# Patient Record
Sex: Male | Born: 1955 | Race: White | Hispanic: No | Marital: Married | Smoking: Former smoker
Health system: Southern US, Community
[De-identification: ages and names within clinical notes are randomized; demographics above are authoritative.]

## PROBLEM LIST (undated history)

## (undated) DIAGNOSIS — F32A Depression, unspecified: Secondary | ICD-10-CM

## (undated) DIAGNOSIS — Z1509 Genetic susceptibility to other malignant neoplasm: Secondary | ICD-10-CM

## (undated) DIAGNOSIS — D369 Benign neoplasm, unspecified site: Secondary | ICD-10-CM

## (undated) DIAGNOSIS — Z973 Presence of spectacles and contact lenses: Secondary | ICD-10-CM

## (undated) DIAGNOSIS — M549 Dorsalgia, unspecified: Secondary | ICD-10-CM

## (undated) DIAGNOSIS — I209 Angina pectoris, unspecified: Secondary | ICD-10-CM

## (undated) DIAGNOSIS — F419 Anxiety disorder, unspecified: Secondary | ICD-10-CM

## (undated) DIAGNOSIS — Z87820 Personal history of traumatic brain injury: Secondary | ICD-10-CM

## (undated) DIAGNOSIS — M719 Bursopathy, unspecified: Secondary | ICD-10-CM

## (undated) DIAGNOSIS — Z8616 Personal history of COVID-19: Secondary | ICD-10-CM

## (undated) DIAGNOSIS — A6920 Lyme disease, unspecified: Secondary | ICD-10-CM

## (undated) DIAGNOSIS — F1020 Alcohol dependence, uncomplicated: Secondary | ICD-10-CM

## (undated) DIAGNOSIS — Z9889 Other specified postprocedural states: Secondary | ICD-10-CM

## (undated) DIAGNOSIS — R350 Frequency of micturition: Secondary | ICD-10-CM

## (undated) DIAGNOSIS — I2510 Atherosclerotic heart disease of native coronary artery without angina pectoris: Secondary | ICD-10-CM

## (undated) DIAGNOSIS — G629 Polyneuropathy, unspecified: Secondary | ICD-10-CM

## (undated) DIAGNOSIS — K219 Gastro-esophageal reflux disease without esophagitis: Secondary | ICD-10-CM

## (undated) DIAGNOSIS — E7800 Pure hypercholesterolemia, unspecified: Secondary | ICD-10-CM

## (undated) DIAGNOSIS — M1990 Unspecified osteoarthritis, unspecified site: Secondary | ICD-10-CM

## (undated) HISTORY — DX: Benign neoplasm, unspecified site: D36.9

## (undated) HISTORY — DX: Gastro-esophageal reflux disease without esophagitis: K21.9

## (undated) HISTORY — PX: KNEE SURGERY: SHX244

## (undated) HISTORY — PX: NOSE SURGERY: SHX723

## (undated) HISTORY — PX: BASAL CELL CARCINOMA EXCISION: SHX1214

## (undated) HISTORY — DX: Bursopathy, unspecified: M71.9

## (undated) HISTORY — PX: TYMPANOSTOMY TUBE PLACEMENT: SHX32

## (undated) HISTORY — PX: RHINOPLASTY: SUR1284

## (undated) HISTORY — PX: EYE SURGERY: SHX253

## (undated) HISTORY — DX: Genetic susceptibility to other malignant neoplasm: Z15.09

---

## 2001-05-06 ENCOUNTER — Ambulatory Visit (HOSPITAL_COMMUNITY): Admission: RE | Admit: 2001-05-06 | Discharge: 2001-05-06 | Payer: Self-pay | Admitting: Internal Medicine

## 2001-12-09 ENCOUNTER — Ambulatory Visit (HOSPITAL_COMMUNITY): Admission: RE | Admit: 2001-12-09 | Discharge: 2001-12-09 | Payer: Self-pay | Admitting: Internal Medicine

## 2001-12-09 HISTORY — PX: COLONOSCOPY: SHX174

## 2002-05-04 ENCOUNTER — Ambulatory Visit (HOSPITAL_COMMUNITY): Admission: RE | Admit: 2002-05-04 | Discharge: 2002-05-04 | Payer: Self-pay | Admitting: Family Medicine

## 2002-05-04 ENCOUNTER — Encounter: Payer: Self-pay | Admitting: Family Medicine

## 2002-12-15 ENCOUNTER — Ambulatory Visit (HOSPITAL_COMMUNITY): Admission: RE | Admit: 2002-12-15 | Discharge: 2002-12-15 | Payer: Self-pay | Admitting: Internal Medicine

## 2002-12-15 HISTORY — PX: COLONOSCOPY: SHX174

## 2002-12-27 ENCOUNTER — Encounter: Payer: Self-pay | Admitting: Family Medicine

## 2002-12-27 ENCOUNTER — Ambulatory Visit (HOSPITAL_COMMUNITY): Admission: RE | Admit: 2002-12-27 | Discharge: 2002-12-27 | Payer: Self-pay | Admitting: Family Medicine

## 2004-04-10 ENCOUNTER — Ambulatory Visit (HOSPITAL_COMMUNITY): Admission: RE | Admit: 2004-04-10 | Discharge: 2004-04-10 | Payer: Self-pay | Admitting: Internal Medicine

## 2004-04-10 HISTORY — PX: COLONOSCOPY: SHX174

## 2004-09-16 ENCOUNTER — Emergency Department (HOSPITAL_COMMUNITY): Admission: EM | Admit: 2004-09-16 | Discharge: 2004-09-16 | Payer: Self-pay | Admitting: *Deleted

## 2005-02-02 ENCOUNTER — Ambulatory Visit: Payer: Self-pay | Admitting: Internal Medicine

## 2005-12-01 ENCOUNTER — Ambulatory Visit: Payer: Self-pay | Admitting: Internal Medicine

## 2005-12-01 ENCOUNTER — Ambulatory Visit (HOSPITAL_COMMUNITY): Admission: RE | Admit: 2005-12-01 | Discharge: 2005-12-01 | Payer: Self-pay | Admitting: Internal Medicine

## 2005-12-23 ENCOUNTER — Ambulatory Visit (HOSPITAL_COMMUNITY): Admission: RE | Admit: 2005-12-23 | Discharge: 2005-12-23 | Payer: Self-pay | Admitting: Internal Medicine

## 2005-12-23 ENCOUNTER — Ambulatory Visit: Payer: Self-pay | Admitting: Internal Medicine

## 2005-12-23 HISTORY — PX: ESOPHAGOGASTRODUODENOSCOPY: SHX1529

## 2005-12-23 HISTORY — PX: COLONOSCOPY: SHX174

## 2006-02-04 ENCOUNTER — Ambulatory Visit (HOSPITAL_COMMUNITY): Admission: RE | Admit: 2006-02-04 | Discharge: 2006-02-04 | Payer: Self-pay | Admitting: Family Medicine

## 2006-03-25 ENCOUNTER — Other Ambulatory Visit: Admission: RE | Admit: 2006-03-25 | Discharge: 2006-03-25 | Payer: Self-pay | Admitting: Urology

## 2006-06-17 ENCOUNTER — Emergency Department (HOSPITAL_COMMUNITY): Admission: EM | Admit: 2006-06-17 | Discharge: 2006-06-18 | Payer: Self-pay | Admitting: Emergency Medicine

## 2007-01-19 ENCOUNTER — Ambulatory Visit (HOSPITAL_COMMUNITY): Admission: RE | Admit: 2007-01-19 | Discharge: 2007-01-19 | Payer: Self-pay | Admitting: Family Medicine

## 2007-12-29 ENCOUNTER — Ambulatory Visit: Payer: Self-pay | Admitting: Internal Medicine

## 2008-01-20 ENCOUNTER — Ambulatory Visit: Payer: Self-pay | Admitting: Internal Medicine

## 2008-01-20 ENCOUNTER — Encounter: Payer: Self-pay | Admitting: Internal Medicine

## 2008-01-20 ENCOUNTER — Ambulatory Visit (HOSPITAL_COMMUNITY): Admission: RE | Admit: 2008-01-20 | Discharge: 2008-01-20 | Payer: Self-pay | Admitting: Internal Medicine

## 2008-01-20 HISTORY — PX: COLONOSCOPY: SHX174

## 2008-07-13 ENCOUNTER — Ambulatory Visit: Payer: Self-pay | Admitting: Internal Medicine

## 2008-11-22 ENCOUNTER — Ambulatory Visit (HOSPITAL_COMMUNITY): Admission: RE | Admit: 2008-11-22 | Discharge: 2008-11-22 | Payer: Self-pay | Admitting: Internal Medicine

## 2009-11-22 ENCOUNTER — Ambulatory Visit (HOSPITAL_COMMUNITY): Admission: RE | Admit: 2009-11-22 | Discharge: 2009-11-22 | Payer: Self-pay | Admitting: Internal Medicine

## 2010-03-06 ENCOUNTER — Encounter (INDEPENDENT_AMBULATORY_CARE_PROVIDER_SITE_OTHER): Payer: Self-pay

## 2010-05-02 ENCOUNTER — Encounter: Payer: Self-pay | Admitting: Internal Medicine

## 2010-05-22 ENCOUNTER — Ambulatory Visit: Payer: Self-pay | Admitting: Internal Medicine

## 2010-05-22 ENCOUNTER — Ambulatory Visit (HOSPITAL_COMMUNITY): Admission: RE | Admit: 2010-05-22 | Discharge: 2010-05-22 | Payer: Self-pay | Admitting: Internal Medicine

## 2010-05-22 HISTORY — PX: COLONOSCOPY: SHX174

## 2010-05-26 ENCOUNTER — Encounter: Payer: Self-pay | Admitting: Internal Medicine

## 2010-09-29 ENCOUNTER — Inpatient Hospital Stay (HOSPITAL_COMMUNITY)
Admission: RE | Admit: 2010-09-29 | Discharge: 2010-10-02 | Payer: Medicare Other | Source: Home / Self Care | Attending: Orthopedic Surgery | Admitting: Orthopedic Surgery

## 2010-09-29 LAB — CBC
HCT: 45.6 % (ref 39.0–52.0)
Hemoglobin: 16 g/dL (ref 13.0–17.0)
MCH: 31 pg (ref 26.0–34.0)
MCHC: 35.1 g/dL (ref 30.0–36.0)
MCV: 88.4 fL (ref 78.0–100.0)
Platelets: 271 10*3/uL (ref 150–400)
RBC: 5.16 MIL/uL (ref 4.22–5.81)
RDW: 11.9 % (ref 11.5–15.5)
WBC: 7.4 10*3/uL (ref 4.0–10.5)

## 2010-09-29 LAB — URINALYSIS, ROUTINE W REFLEX MICROSCOPIC
Bilirubin Urine: NEGATIVE
Hgb urine dipstick: NEGATIVE
Ketones, ur: NEGATIVE mg/dL
Nitrite: NEGATIVE
Protein, ur: NEGATIVE mg/dL
Specific Gravity, Urine: 1.021 (ref 1.005–1.030)
Urine Glucose, Fasting: NEGATIVE mg/dL
Urobilinogen, UA: 0.2 mg/dL (ref 0.0–1.0)
pH: 5 (ref 5.0–8.0)

## 2010-09-29 LAB — COMPREHENSIVE METABOLIC PANEL
ALT: 48 U/L (ref 0–53)
AST: 34 U/L (ref 0–37)
Albumin: 4.1 g/dL (ref 3.5–5.2)
Alkaline Phosphatase: 81 U/L (ref 39–117)
BUN: 17 mg/dL (ref 6–23)
CO2: 24 mEq/L (ref 19–32)
Calcium: 9.2 mg/dL (ref 8.4–10.5)
Chloride: 103 mEq/L (ref 96–112)
Creatinine, Ser: 0.98 mg/dL (ref 0.4–1.5)
GFR calc Af Amer: >60 mL/min (ref >60)
GFR calc non Af Amer: >60 mL/min (ref >60)
Glucose, Bld: 131 mg/dL — ABNORMAL HIGH (ref 70–99)
Potassium: 3.8 mEq/L (ref 3.5–5.1)
Sodium: 137 mEq/L (ref 135–145)
Total Bilirubin: 0.5 mg/dL (ref 0.3–1.2)
Total Protein: 7.4 g/dL (ref 6.0–8.3)

## 2010-09-29 LAB — PROTIME-INR
INR: 0.93 (ref 0.00–1.49)
Prothrombin Time: 12.7 seconds (ref 11.6–15.2)

## 2010-09-29 LAB — APTT: aPTT: 33 seconds (ref 24–37)

## 2010-09-29 LAB — SURGICAL PCR SCREEN
MRSA, PCR: NEGATIVE
Staphylococcus aureus: NEGATIVE

## 2010-09-30 HISTORY — PX: JOINT REPLACEMENT: SHX530

## 2010-10-01 LAB — BASIC METABOLIC PANEL
BUN: 12 mg/dL (ref 6–23)
CO2: 28 mEq/L (ref 19–32)
Calcium: 8.4 mg/dL (ref 8.4–10.5)
Chloride: 102 mEq/L (ref 96–112)
Creatinine, Ser: 1.08 mg/dL (ref 0.4–1.5)
GFR calc Af Amer: >60 mL/min (ref >60)
GFR calc non Af Amer: >60 mL/min (ref >60)
Glucose, Bld: 148 mg/dL — ABNORMAL HIGH (ref 70–99)
Potassium: 4.7 mEq/L (ref 3.5–5.1)
Sodium: 136 mEq/L (ref 135–145)

## 2010-10-01 LAB — TYPE AND SCREEN
ABO/RH(D): O POS
Antibody Screen: NEGATIVE

## 2010-10-01 LAB — CBC
HCT: 38.7 % — ABNORMAL LOW (ref 39.0–52.0)
Hemoglobin: 13.1 g/dL (ref 13.0–17.0)
MCH: 30.9 pg (ref 26.0–34.0)
MCHC: 33.9 g/dL (ref 30.0–36.0)
MCV: 91.3 fL (ref 78.0–100.0)
Platelets: 236 10*3/uL (ref 150–400)
RBC: 4.24 MIL/uL (ref 4.22–5.81)
RDW: 12.3 % (ref 11.5–15.5)
WBC: 10.6 10*3/uL — ABNORMAL HIGH (ref 4.0–10.5)

## 2010-10-01 LAB — ABO/RH: ABO/RH(D): O POS

## 2010-10-06 LAB — CBC
HCT: 35.6 % — ABNORMAL LOW (ref 39.0–52.0)
HCT: 33 % — ABNORMAL LOW (ref 39.0–52.0)
Hemoglobin: 12 g/dL — ABNORMAL LOW (ref 13.0–17.0)
Hemoglobin: 11.1 g/dL — ABNORMAL LOW (ref 13.0–17.0)
MCH: 30.7 pg (ref 26.0–34.0)
MCH: 30.2 pg (ref 26.0–34.0)
MCHC: 33.7 g/dL (ref 30.0–36.0)
MCHC: 33.6 g/dL (ref 30.0–36.0)
MCV: 91 fL (ref 78.0–100.0)
MCV: 89.9 fL (ref 78.0–100.0)
Platelets: 210 10*3/uL (ref 150–400)
Platelets: 208 10*3/uL (ref 150–400)
RBC: 3.91 MIL/uL — ABNORMAL LOW (ref 4.22–5.81)
RBC: 3.67 MIL/uL — ABNORMAL LOW (ref 4.22–5.81)
RDW: 12.2 % (ref 11.5–15.5)
RDW: 12 % (ref 11.5–15.5)
WBC: 8.8 10*3/uL (ref 4.0–10.5)
WBC: 10.1 10*3/uL (ref 4.0–10.5)

## 2010-10-06 LAB — BASIC METABOLIC PANEL
BUN: 9 mg/dL (ref 6–23)
CO2: 29 mEq/L (ref 19–32)
Calcium: 8.6 mg/dL (ref 8.4–10.5)
Chloride: 98 mEq/L (ref 96–112)
Creatinine, Ser: 1 mg/dL (ref 0.4–1.5)
GFR calc Af Amer: >60 mL/min (ref >60)
GFR calc non Af Amer: >60 mL/min (ref >60)
Glucose, Bld: 117 mg/dL — ABNORMAL HIGH (ref 70–99)
Potassium: 4 mEq/L (ref 3.5–5.1)
Sodium: 135 mEq/L (ref 135–145)

## 2010-10-14 NOTE — Letter (Signed)
Summary: Patient Notice, Colon Biopsy Results  [facility]  [address]   [place], [place] [address]   Phone: [phone]  Fax: [phone]       May 26, 2010   SHERMON BOZZI [address] [place], [place]  [address] [birth year]    Dear Mr. Loja,  I am pleased to inform you that the biopsies taken during your recent colonoscopy did not show any evidence of cancer upon pathologic examination.  Additional information/recommendations:  You should have a repeat colonoscopy examination  in 2 years.  Please call us if you are having persistent problems or have questions about your condition that have not been fully answered at this time.  Sincerely,    R. Roetta Sessions MD, Caleen Essex  [facility] Ph: [phone]    Fax: [phone]   Appended Document: Patient Notice, Colon Biopsy Results letter mailed to pt  Appended Document: Patient Notice, Colon Biopsy Results REMINDER FOR REPEAT TCS IN 2 YRS IS IN THE COMPUTER

## 2010-10-14 NOTE — Letter (Signed)
Summary: Internal Other Domingo Dimes  Internal Other Domingo Dimes   Imported By: Cloria Spring LPN 16/06/9603 54:09:81  _____________________________________________________________________  External Attachment:    Type:   Image     Comment:   External Document

## 2010-10-14 NOTE — Letter (Signed)
Summary: Recall Colonoscopy/Endoscopy, Change to Office Visit  [facility]  [address]   [place], [place] [address]   Phone: [phone]  Fax: [phone]      March 06, 2010   JUDD MCCUBBIN [address] [place], [place]  [address] 1956-09-05   Dear Mr. Mannes,   According to our records, it is time for you to schedule a Colonoscopy/Endoscopy. However, after reviewing your medical record, we recommend an office visit in order to determine your need for a repeat procedure.  Please call [phone] at your convenience to schedule an office visit. If you have any questions or concerns, please feel free to contact our office.   Sincerely,   Cloria Spring LPN  [facility] Ph: [phone]   Fax: [phone]

## 2010-10-28 ENCOUNTER — Ambulatory Visit (HOSPITAL_COMMUNITY)
Admission: RE | Admit: 2010-10-28 | Discharge: 2010-10-28 | Disposition: A | Discharge status: Home / Self Care | Payer: Worker's Compensation | Source: Ambulatory Visit | Attending: Orthopedic Surgery | Admitting: Orthopedic Surgery

## 2010-10-28 DIAGNOSIS — M6281 Muscle weakness (generalized): Secondary | ICD-10-CM | POA: Insufficient documentation

## 2010-10-28 DIAGNOSIS — M25569 Pain in unspecified knee: Secondary | ICD-10-CM | POA: Insufficient documentation

## 2010-10-28 DIAGNOSIS — IMO0001 Reserved for inherently not codable concepts without codable children: Secondary | ICD-10-CM | POA: Insufficient documentation

## 2010-10-28 DIAGNOSIS — R262 Difficulty in walking, not elsewhere classified: Secondary | ICD-10-CM | POA: Insufficient documentation

## 2010-10-28 DIAGNOSIS — M25669 Stiffness of unspecified knee, not elsewhere classified: Secondary | ICD-10-CM | POA: Insufficient documentation

## 2010-10-29 ENCOUNTER — Ambulatory Visit (HOSPITAL_COMMUNITY)
Admission: RE | Admit: 2010-10-29 | Discharge: 2010-10-29 | Disposition: A | Discharge status: Home / Self Care | Payer: Worker's Compensation | Source: Ambulatory Visit | Attending: Orthopedic Surgery | Admitting: Orthopedic Surgery

## 2010-10-29 NOTE — Discharge Summary (Signed)
NAME:  Craig Scott, Craig Scott              ACCOUNT NO.:  [id number]  MEDICAL RECORD NO.:  [id number]          PATIENT TYPE:  INP  LOCATION:  1616                         FACILITY:  [facility]  PHYSICIAN:  Ollen Gross, M.D.    DATE OF BIRTH:  04-21-1956  DATE OF ADMISSION:  09/29/2010 DATE OF DISCHARGE:  10/02/2010                              DISCHARGE SUMMARY   ADMITTING DIAGNOSES: 1. Osteoarthritis, right knee. 2. Anxiety. 3. Constipation secondary to meds. 4. Arthritis. 5. Past history of left ankle and right knee fractures.  DISCHARGE DIAGNOSES: 1. Osteoarthritis, right knee, status post right total knee     replacement arthroplasty. 2. Anxiety. 3. Constipation secondary to meds. 4. Arthritis. 5. Past history of left ankle and right knee fractures.  PROCEDURE:  September 29, 2010, right total knee.  Surgeon, Dr. Lequita Halt. Assistant, Alexzandrew L. Grothaus, P.A.C.  Anesthesia was spinal. Tourniquet time of 37 minutes.  CONSULTS:  None.  BRIEF HISTORY:  Craig Scott is a 55 year old male with end-stage arthritis, right knee; progressive worsening pain and dysfunction, failed nonoperative management, now presents for total knee arthroplasty.  LABORATORY DATA:  Preop CBC showed hemoglobin 16, hematocrit of 45.6, white cell count 7.4, platelets 271.  Postop hemoglobin 13.1 and 12. Last night H and H of 11.1 and 33.0.  PT/INR 12.7 of 0.93 with a PTT of 33.  Chem panel on admission, elevated glucose of 131.  Remaining Chem panel within normal limits.  Serial BMETs were followed for 48 hours. Electrolytes remained within normal limits.  Preop UA negative.  Blood group type O positive.  Nasal swabs were negative for staph aureus, negative for MRSA.  X-rays, 2-view chest, September 24, 2010, negative chest x-ray.  HOSPITAL COURSE:  The patient was admitted to [facility], taken to OR, underwent above-stated procedure without complication.  The patient tolerated the procedure well,  later transferred to the recovery room on orthopedic floor, started on p.o. and IV analgesic pain control following surgery, given 24 hours postop IV antibiotics.  Had been placed on Xarelto, actually, for DVT prophylaxis.  Had low urinary output but we watched his output and made sure that he started diuresing his fluids well.  He started getting up out of bed on day #1.  By day #2, he was actually doing a little bit better, had elevated temp of 101.7 on the evening of day #1 but that was already down to 99 on postop day #2.  Encouraged incentive spirometer.  Dressing changed.  Incision looked good.  No signs of infection.  Hemoglobin was 12.  Continued to progress well with physical therapy, was meeting his goals; and by postop day #3, the patient was doing well, tolerating meds and was discharged home.  DISCHARGE/PLAN: 1. The patient discharged home on October 02, 2010. 2. Discharge diagnoses, please see above. 3. Discharge meds:  Vicodin 10 mg, Robaxin and Xarelto.  Continue home     aspirin, lidocaine patch, potassium gluconate, Prilosec, Xanax,     Ambien and Zyrtec.  DIET:  Heart-healthy diet.  ACTIVITY:  Weightbearing as tolerated.  Total knee protocol.  Home health PT, Home Health Nursing.  Followup in 2 weeks.  DISPOSITION:  Home.  CONDITION ON DISCHARGE:  Improved.     Alexzandrew L. Julien Girt, P.A.C.   ______________________________ Ollen Gross, M.D.    ALP/MEDQ  D:  10/16/2010  T:  10/16/2010  Job:  [id number]  cc:   Kirk Ruths, M.D. Fax: [phone]  Electronically Signed by Patrica Duel P.A.C. on 10/16/2010 11:46:02 AM Electronically Signed by Ollen Gross M.D. on 10/29/2010 02:51:50 PM

## 2010-10-31 ENCOUNTER — Ambulatory Visit (HOSPITAL_COMMUNITY)
Admission: RE | Admit: 2010-10-31 | Discharge: 2010-10-31 | Disposition: A | Discharge status: Home / Self Care | Payer: Worker's Compensation | Source: Ambulatory Visit | Attending: Orthopedic Surgery | Admitting: Orthopedic Surgery

## 2010-11-04 ENCOUNTER — Ambulatory Visit (HOSPITAL_COMMUNITY)
Admission: RE | Admit: 2010-11-04 | Discharge: 2010-11-04 | Disposition: A | Discharge status: Home / Self Care | Payer: Worker's Compensation | Source: Ambulatory Visit | Attending: *Deleted | Admitting: *Deleted

## 2010-11-06 ENCOUNTER — Ambulatory Visit (HOSPITAL_COMMUNITY)
Admission: RE | Admit: 2010-11-06 | Discharge: 2010-11-06 | Disposition: A | Discharge status: Home / Self Care | Payer: Worker's Compensation | Source: Ambulatory Visit | Attending: *Deleted | Admitting: *Deleted

## 2010-11-07 ENCOUNTER — Ambulatory Visit (HOSPITAL_COMMUNITY)
Admission: RE | Admit: 2010-11-07 | Discharge: 2010-11-07 | Disposition: A | Discharge status: Home / Self Care | Payer: Worker's Compensation | Source: Ambulatory Visit | Attending: *Deleted | Admitting: *Deleted

## 2010-11-11 ENCOUNTER — Ambulatory Visit (HOSPITAL_COMMUNITY)
Admission: RE | Admit: 2010-11-11 | Discharge: 2010-11-11 | Disposition: A | Discharge status: Home / Self Care | Payer: Worker's Compensation | Source: Ambulatory Visit | Attending: *Deleted | Admitting: *Deleted

## 2010-11-12 ENCOUNTER — Ambulatory Visit (HOSPITAL_COMMUNITY)
Admission: RE | Admit: 2010-11-12 | Discharge: 2010-11-12 | Disposition: A | Discharge status: Home / Self Care | Payer: Worker's Compensation | Source: Ambulatory Visit | Attending: *Deleted | Admitting: *Deleted

## 2010-11-14 ENCOUNTER — Ambulatory Visit (HOSPITAL_COMMUNITY)
Admission: RE | Admit: 2010-11-14 | Discharge: 2010-11-14 | Disposition: A | Discharge status: Home / Self Care | Payer: Worker's Compensation | Source: Ambulatory Visit | Attending: Orthopedic Surgery | Admitting: Orthopedic Surgery

## 2010-11-14 DIAGNOSIS — IMO0001 Reserved for inherently not codable concepts without codable children: Secondary | ICD-10-CM | POA: Insufficient documentation

## 2010-11-14 DIAGNOSIS — M25669 Stiffness of unspecified knee, not elsewhere classified: Secondary | ICD-10-CM | POA: Insufficient documentation

## 2010-11-14 DIAGNOSIS — M6281 Muscle weakness (generalized): Secondary | ICD-10-CM | POA: Insufficient documentation

## 2010-11-14 DIAGNOSIS — M25569 Pain in unspecified knee: Secondary | ICD-10-CM | POA: Insufficient documentation

## 2010-11-14 DIAGNOSIS — R262 Difficulty in walking, not elsewhere classified: Secondary | ICD-10-CM | POA: Insufficient documentation

## 2010-11-18 ENCOUNTER — Ambulatory Visit (HOSPITAL_COMMUNITY)
Admission: RE | Admit: 2010-11-18 | Discharge: 2010-11-18 | Disposition: A | Discharge status: Home / Self Care | Payer: Worker's Compensation | Source: Ambulatory Visit | Attending: *Deleted | Admitting: *Deleted

## 2010-11-19 ENCOUNTER — Ambulatory Visit (HOSPITAL_COMMUNITY)
Admission: RE | Admit: 2010-11-19 | Discharge: 2010-11-19 | Disposition: A | Discharge status: Home / Self Care | Payer: Worker's Compensation | Source: Ambulatory Visit | Attending: *Deleted | Admitting: *Deleted

## 2010-11-21 ENCOUNTER — Ambulatory Visit (HOSPITAL_COMMUNITY)
Admission: RE | Admit: 2010-11-21 | Discharge: 2010-11-21 | Disposition: A | Discharge status: Home / Self Care | Payer: Worker's Compensation | Source: Ambulatory Visit | Attending: *Deleted | Admitting: *Deleted

## 2010-11-25 ENCOUNTER — Ambulatory Visit (HOSPITAL_COMMUNITY)
Admission: RE | Admit: 2010-11-25 | Discharge: 2010-11-25 | Disposition: A | Discharge status: Home / Self Care | Payer: Worker's Compensation | Source: Ambulatory Visit | Attending: Orthopedic Surgery | Admitting: Orthopedic Surgery

## 2010-11-26 ENCOUNTER — Ambulatory Visit (HOSPITAL_COMMUNITY)
Admission: RE | Admit: 2010-11-26 | Discharge: 2010-11-26 | Disposition: A | Discharge status: Home / Self Care | Payer: Worker's Compensation | Source: Ambulatory Visit | Attending: *Deleted | Admitting: *Deleted

## 2010-11-27 LAB — BASIC METABOLIC PANEL
BUN: 15 mg/dL (ref 6–23)
CO2: 27 mEq/L (ref 19–32)
Calcium: 9.4 mg/dL (ref 8.4–10.5)
Chloride: 100 mEq/L (ref 96–112)
Creatinine, Ser: 0.98 mg/dL (ref 0.4–1.5)
GFR calc Af Amer: >60 mL/min (ref >60)
GFR calc non Af Amer: >60 mL/min (ref >60)
Glucose, Bld: 92 mg/dL (ref 70–99)
Potassium: 3.8 mEq/L (ref 3.5–5.1)
Sodium: 136 mEq/L (ref 135–145)

## 2010-11-27 LAB — HEMOGLOBIN AND HEMATOCRIT, BLOOD
HCT: 43.3 % (ref 39.0–52.0)
Hemoglobin: 15 g/dL (ref 13.0–17.0)

## 2010-11-28 ENCOUNTER — Ambulatory Visit (HOSPITAL_COMMUNITY)
Admission: RE | Admit: 2010-11-28 | Discharge: 2010-11-28 | Disposition: A | Discharge status: Home / Self Care | Payer: Worker's Compensation | Source: Ambulatory Visit

## 2010-12-01 ENCOUNTER — Ambulatory Visit (HOSPITAL_COMMUNITY)
Admission: RE | Admit: 2010-12-01 | Discharge: 2010-12-01 | Disposition: A | Discharge status: Home / Self Care | Payer: Worker's Compensation | Source: Ambulatory Visit | Attending: *Deleted | Admitting: *Deleted

## 2010-12-03 ENCOUNTER — Ambulatory Visit (HOSPITAL_COMMUNITY)
Admission: RE | Admit: 2010-12-03 | Discharge: 2010-12-03 | Disposition: A | Discharge status: Home / Self Care | Payer: Worker's Compensation | Source: Ambulatory Visit | Attending: *Deleted | Admitting: *Deleted

## 2010-12-05 ENCOUNTER — Ambulatory Visit (HOSPITAL_COMMUNITY)
Admission: RE | Admit: 2010-12-05 | Discharge: 2010-12-05 | Disposition: A | Discharge status: Home / Self Care | Payer: Worker's Compensation | Source: Ambulatory Visit

## 2010-12-10 ENCOUNTER — Ambulatory Visit (HOSPITAL_COMMUNITY)
Admission: RE | Admit: 2010-12-10 | Discharge: 2010-12-10 | Disposition: A | Discharge status: Home / Self Care | Payer: Worker's Compensation | Source: Ambulatory Visit | Attending: *Deleted | Admitting: *Deleted

## 2010-12-12 ENCOUNTER — Inpatient Hospital Stay (HOSPITAL_COMMUNITY)
Admission: RE | Admit: 2010-12-12 | Discharge: 2010-12-12 | Disposition: A | Discharge status: Home / Self Care | Payer: Worker's Compensation | Source: Ambulatory Visit

## 2010-12-16 ENCOUNTER — Ambulatory Visit (HOSPITAL_COMMUNITY)
Admission: RE | Admit: 2010-12-16 | Discharge: 2010-12-16 | Disposition: A | Discharge status: Home / Self Care | Payer: Worker's Compensation | Source: Ambulatory Visit | Attending: Orthopedic Surgery | Admitting: Orthopedic Surgery

## 2010-12-16 DIAGNOSIS — M25569 Pain in unspecified knee: Secondary | ICD-10-CM | POA: Insufficient documentation

## 2010-12-16 DIAGNOSIS — IMO0001 Reserved for inherently not codable concepts without codable children: Secondary | ICD-10-CM | POA: Insufficient documentation

## 2010-12-16 DIAGNOSIS — M6281 Muscle weakness (generalized): Secondary | ICD-10-CM | POA: Insufficient documentation

## 2010-12-16 DIAGNOSIS — R262 Difficulty in walking, not elsewhere classified: Secondary | ICD-10-CM | POA: Insufficient documentation

## 2010-12-16 DIAGNOSIS — M25669 Stiffness of unspecified knee, not elsewhere classified: Secondary | ICD-10-CM | POA: Insufficient documentation

## 2010-12-17 ENCOUNTER — Ambulatory Visit (HOSPITAL_COMMUNITY)
Admission: RE | Admit: 2010-12-17 | Discharge: 2010-12-17 | Disposition: A | Discharge status: Home / Self Care | Payer: Worker's Compensation | Source: Ambulatory Visit | Attending: Orthopedic Surgery | Admitting: Orthopedic Surgery

## 2010-12-17 DIAGNOSIS — M25569 Pain in unspecified knee: Secondary | ICD-10-CM | POA: Insufficient documentation

## 2010-12-17 DIAGNOSIS — IMO0001 Reserved for inherently not codable concepts without codable children: Secondary | ICD-10-CM | POA: Insufficient documentation

## 2010-12-17 DIAGNOSIS — M6281 Muscle weakness (generalized): Secondary | ICD-10-CM | POA: Insufficient documentation

## 2010-12-17 DIAGNOSIS — R262 Difficulty in walking, not elsewhere classified: Secondary | ICD-10-CM | POA: Insufficient documentation

## 2010-12-17 DIAGNOSIS — M25669 Stiffness of unspecified knee, not elsewhere classified: Secondary | ICD-10-CM | POA: Insufficient documentation

## 2010-12-18 ENCOUNTER — Ambulatory Visit (HOSPITAL_COMMUNITY)
Admission: RE | Admit: 2010-12-18 | Discharge: 2010-12-18 | Disposition: A | Discharge status: Home / Self Care | Payer: Worker's Compensation | Source: Ambulatory Visit | Attending: Physical Therapy | Admitting: Physical Therapy

## 2010-12-22 ENCOUNTER — Ambulatory Visit (HOSPITAL_COMMUNITY): Payer: Worker's Compensation | Admitting: Physical Therapy

## 2010-12-24 ENCOUNTER — Ambulatory Visit (HOSPITAL_COMMUNITY): Payer: Worker's Compensation | Admitting: Physical Therapy

## 2010-12-26 ENCOUNTER — Ambulatory Visit (HOSPITAL_COMMUNITY): Payer: Worker's Compensation | Admitting: Physical Therapy

## 2010-12-26 ENCOUNTER — Ambulatory Visit (HOSPITAL_COMMUNITY)
Admission: RE | Admit: 2010-12-26 | Discharge: 2010-12-26 | Disposition: A | Discharge status: Home / Self Care | Payer: Worker's Compensation | Source: Ambulatory Visit

## 2010-12-30 ENCOUNTER — Ambulatory Visit (HOSPITAL_COMMUNITY)
Admission: RE | Admit: 2010-12-30 | Discharge: 2010-12-30 | Disposition: A | Discharge status: Home / Self Care | Payer: Worker's Compensation | Source: Ambulatory Visit | Attending: *Deleted | Admitting: *Deleted

## 2010-12-31 ENCOUNTER — Ambulatory Visit (HOSPITAL_COMMUNITY)
Admission: RE | Admit: 2010-12-31 | Discharge: 2010-12-31 | Disposition: A | Discharge status: Home / Self Care | Payer: Worker's Compensation | Source: Ambulatory Visit

## 2011-01-02 ENCOUNTER — Ambulatory Visit (HOSPITAL_COMMUNITY)
Admission: RE | Admit: 2011-01-02 | Discharge: 2011-01-02 | Disposition: A | Discharge status: Home / Self Care | Payer: Worker's Compensation | Source: Ambulatory Visit

## 2011-01-05 ENCOUNTER — Ambulatory Visit (HOSPITAL_COMMUNITY)
Admission: RE | Admit: 2011-01-05 | Discharge: 2011-01-05 | Disposition: A | Discharge status: Home / Self Care | Payer: Worker's Compensation | Source: Ambulatory Visit | Attending: *Deleted | Admitting: *Deleted

## 2011-01-07 ENCOUNTER — Ambulatory Visit (HOSPITAL_COMMUNITY)
Admission: RE | Admit: 2011-01-07 | Discharge: 2011-01-07 | Disposition: A | Discharge status: Home / Self Care | Payer: Worker's Compensation | Source: Ambulatory Visit | Attending: *Deleted | Admitting: *Deleted

## 2011-01-09 ENCOUNTER — Ambulatory Visit (HOSPITAL_COMMUNITY)
Admission: RE | Admit: 2011-01-09 | Discharge: 2011-01-09 | Disposition: A | Discharge status: Home / Self Care | Payer: Worker's Compensation | Source: Ambulatory Visit | Attending: *Deleted | Admitting: *Deleted

## 2011-01-12 ENCOUNTER — Ambulatory Visit (HOSPITAL_COMMUNITY)
Admission: RE | Admit: 2011-01-12 | Discharge: 2011-01-12 | Disposition: A | Discharge status: Home / Self Care | Payer: Worker's Compensation | Source: Ambulatory Visit | Attending: *Deleted | Admitting: *Deleted

## 2011-01-12 ENCOUNTER — Ambulatory Visit (INDEPENDENT_AMBULATORY_CARE_PROVIDER_SITE_OTHER): Payer: BC Managed Care – PPO | Admitting: Psychology

## 2011-01-12 DIAGNOSIS — F329 Major depressive disorder, single episode, unspecified: Secondary | ICD-10-CM

## 2011-01-12 DIAGNOSIS — F411 Generalized anxiety disorder: Secondary | ICD-10-CM

## 2011-01-19 ENCOUNTER — Ambulatory Visit (HOSPITAL_COMMUNITY): Payer: Worker's Compensation | Admitting: *Deleted

## 2011-01-19 ENCOUNTER — Ambulatory Visit (HOSPITAL_COMMUNITY)
Admission: RE | Admit: 2011-01-19 | Discharge: 2011-01-19 | Disposition: A | Discharge status: Home / Self Care | Payer: Worker's Compensation | Source: Ambulatory Visit | Attending: Orthopedic Surgery | Admitting: Orthopedic Surgery

## 2011-01-19 DIAGNOSIS — M6281 Muscle weakness (generalized): Secondary | ICD-10-CM | POA: Insufficient documentation

## 2011-01-19 DIAGNOSIS — R262 Difficulty in walking, not elsewhere classified: Secondary | ICD-10-CM | POA: Insufficient documentation

## 2011-01-19 DIAGNOSIS — M25569 Pain in unspecified knee: Secondary | ICD-10-CM | POA: Insufficient documentation

## 2011-01-19 DIAGNOSIS — M25669 Stiffness of unspecified knee, not elsewhere classified: Secondary | ICD-10-CM | POA: Insufficient documentation

## 2011-01-19 DIAGNOSIS — IMO0001 Reserved for inherently not codable concepts without codable children: Secondary | ICD-10-CM | POA: Insufficient documentation

## 2011-01-21 ENCOUNTER — Ambulatory Visit (HOSPITAL_COMMUNITY)
Admission: RE | Admit: 2011-01-21 | Discharge: 2011-01-21 | Disposition: A | Discharge status: Home / Self Care | Payer: Worker's Compensation | Source: Ambulatory Visit | Attending: *Deleted | Admitting: *Deleted

## 2011-01-22 ENCOUNTER — Encounter (INDEPENDENT_AMBULATORY_CARE_PROVIDER_SITE_OTHER): Payer: BC Managed Care – PPO | Admitting: Psychology

## 2011-01-22 DIAGNOSIS — F411 Generalized anxiety disorder: Secondary | ICD-10-CM

## 2011-01-22 DIAGNOSIS — F329 Major depressive disorder, single episode, unspecified: Secondary | ICD-10-CM

## 2011-01-23 ENCOUNTER — Ambulatory Visit (HOSPITAL_COMMUNITY)
Admission: RE | Admit: 2011-01-23 | Discharge: 2011-01-23 | Disposition: A | Discharge status: Home / Self Care | Payer: Worker's Compensation | Source: Ambulatory Visit | Attending: *Deleted | Admitting: *Deleted

## 2011-01-26 ENCOUNTER — Ambulatory Visit (HOSPITAL_COMMUNITY)
Admission: RE | Admit: 2011-01-26 | Discharge: 2011-01-26 | Disposition: A | Discharge status: Home / Self Care | Payer: Worker's Compensation | Source: Ambulatory Visit | Admitting: Physical Therapy

## 2011-01-27 NOTE — Op Note (Signed)
NAME:  Craig Scott, Craig Scott              ACCOUNT NO.:  [id number]   MEDICAL RECORD NO.:  [id number]          PATIENT TYPE:  AMB   LOCATION:  DAY                           FACILITY:  [facility]   PHYSICIAN:  R. Roetta Sessions, M.D. DATE OF BIRTH:  March 29, 1956   DATE OF PROCEDURE:  01/20/2008  DATE OF DISCHARGE:                               OPERATIVE REPORT   INDICATIONS FOR PROCEDURE:  A 55 year old gentleman with a marked  positive family history of colon cancer and personal history of colonic  adenomas.  He is here for surveillance examination if he is devoid of  any lower GI tract symptoms.  This approach has been discussed with the  patient.  The risks, benefits, alternatives, and limitations have been  reviewed.  Questions are answered.  He is agreeable.  Please see  documentation in the medical record.   PROCEDURE NOTE:  O2 saturation, blood pressure, pulse, and respirations  were monitored throughout the entire procedure.   CONSCIOUS SEDATION:  Versed 6 mg IV, Demerol 100 mg IV in divided doses,  and Phenergan 25 mg diluted slow IV push to augment conscious sedation.   INSTRUMENT:  Pentax video chip system.   FINDINGS:  Digital rectal exam revealed no abnormalities.  Endoscopic  findings:  Prep was adequate.  Colon:  Colonic mucosa was surveyed from  the rectosigmoid junction to the left transverse, right colon to the  appendiceal orifice, ileocecal valve, and cecum.  These structures were  well seen and photographed for the record.  Terminal ileum was intubated  10 cm from this level.  The scope was slowly and meticulously withdrawn.  All previous mentioned mucosal surfaces were again seen.  I took a long  time inspecting the mucosa using a combination of tip flexion to full  flattening to get good visualization of all the colonic mucosa.  At the  base, there were a few scattered sigmoid diverticula.  The patient had  multiple 1- to 3-mm ulcerations about the ileocecal valve.  Please  see  photos.  These areas were biopsied.  At the rectosigmoid junction, there  were clustered diminutive polyps of 1- to 3-mm in dimensions.  They were  3.  There were all removed with cold biopsy forceps technique.  The  remainder of colonic mucosa appeared normal.  Terminal ileum mucosa  appeared normal.  The scope was pulled down the rectum where thorough  examination of the rectal mucosa including retroflexed view of the anal  verge demonstrated a 3-mm polyp just inside the anal verge at 3 cm.  This was cold biopsied, removed cleanly from the retroflexed position,  it was not seen on photos.  Remainder of the rectal mucosa appeared  normal.  The patient tolerated the procedure well and was reactive in  Endoscopy.   IMPRESSION:  1. Distal diminutive rectal polyps, status post cold biopsy removal,      otherwise, normal rectum.  2. Distal sigmoid and rectosigmoid diminutive polyps, status post cold      biopsy removal; scattered sigmoid diverticula; and benign-appearing      ulcers about the ileocecal valve, status  post biopsy.  Remainder of      colonic mucosa and terminal mucosa appeared normal.   I suspect the ulcer at the ileocecal valve are a result of aspirin and  Voltaren, which Craig Scott is now taking.   RECOMMENDATIONS:  1. Mild diverticulosis, literature provided to Craig Scott.  2. Craig Scott has previously declined genetic evaluation and tertiary      referral center, but now is agreeable and we are making plans to      game over the [place], given his      marked positive family history of colon cancer, to further evaluate      the family tree.      Craig Scott, M.D.  Electronically Signed     RMR/MEDQ  D:  01/20/2008  T:  01/20/2008  Job:  [id number]   cc:   Debbra Riding

## 2011-01-27 NOTE — Assessment & Plan Note (Signed)
NAME:  HERACLIO, SEIDMAN               CHART#:  [id number]   DATE:  07/13/2008                       DOB:  1955-11-16   History of multiple colonic adenomas, positive family history of colon  cancer in multiple relatives at young ages.   Last colonoscopy on Jan 20, 2008, at which time he was found to have a  hyperplastic polyp in his rectum and rectosigmoid and ileocecal valve  ulceration proven to be benign on biopsies (probably related to  Voltaren).  He has had no rectal bleeding or any problems until the past  5 or 6 days, he said he went to the races in [place] and  went from  his usual consumption of 9-10  and 12 ounces cans of beers today upwards  of a case today and ate very heavy food, three meals a day off the grill  and developed some constipation over this past weekend and with some  straining started to having some rectal bleeding and felt a sensation  there was a fist up in his rectum.  He says he passed a great deal of  blood off and on with bowel movements earlier in the week, but has not  had any today.  His bowels have loosened up.  He has had  multiple bowel  movements that are nonbloody, has not really had any abdominal pain.  Reflux symptoms have been well controlled on Prilosec.  Gastroesophageal  reflux disease symptoms well controlled on Prilosec and/or Zegerid.  He  is taking Mobic at this time.   CURRENT MEDICATIONS:  See updated list.   ALLERGIES:  No known drug allergies.   PHYSICAL EXAMINATION:  GENERAL:  He appears somewhat disheveled, but no  acute distress.  VITAL SIGNS:  I note that he has gained 15-1/2 pounds just since April.  Height 6 feet, temperature 90, BP 120/84, and pulse 80.  SKIN:  Warm and dry.  LUNGS:  Clear to auscultation.  CARDIAC:  Regular rate and rhythm without murmur, gallop, or rub.  ABDOMEN:  Obese, positive bowel sounds, soft, nontender without  appreciable mass or organomegaly.  RECTAL:  No external lesions.  The patient's  anal canal is very tender  and he would not tolerate a full digital exam.  I did not appreciate any  mass, did not see any obvious fissure.  No stool present in distal  rectum.  Mucous Hemoccult negative.   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  He did get into [facility]  for genetic testing - not mentioned above and this was not very helpful  as somehow his appointment got fouled up and he never really had a  formal consultation as he described).  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   RECOMMENDATIONS:  I have asked him to cut way back on his daily beer  consumption from here on now.  We  will go and try some AnaMantle Forte  Cream applied to the anorectum q.i.d.  Prescription [facility] to  [facility].  We will see how this works for him over the next  24-48 hours.  We may need nitroglycerin or Cardizem ointment as a smooth  muscle relaxer and ultimately I told the patient that he may need to see  a surgeon for definitive treatment.  I see no need to perform a repeat  colonoscopy at this time.  Further recommendations to follow in the very  near future.       Jonathon Bellows, M.D.  Electronically Signed     RMR/MEDQ  D:  07/13/2008  T:  07/14/2008  Job:  [id number]   cc:   Kirk Ruths, M.D.

## 2011-01-27 NOTE — H&P (Signed)
NAME:  Craig Scott, Craig Scott              ACCOUNT NO.:  [id number]   MEDICAL RECORD NO.:  [id number]           PATIENT TYPE:  AMB   LOCATION:  DAY                           FACILITY:  [facility]   PHYSICIAN:  R. Roetta Sessions, M.D. DATE OF BIRTH:  12/17/1955   DATE OF ADMISSION:  DATE OF DISCHARGE:  LH                              HISTORY & PHYSICAL   CHIEF COMPLAINT:  1. Positive family history of colon cancer.  2. Personal history of colonic polyps.  3. Due for high-risk screening colonoscopy.  4. History of gastroesophageal reflux disease.   HISTORY OF PRESENT ILLNESS:  The patient is a pleasant 55 year old  gentleman with marked positive family history and multiple family  members with colorectal cancer; he has a personal history of colonic  polyps.  His last colonoscopy was on December 23, 2005, which revealed  normal rectum, scattered sigmoid diverticula, otherwise normal-appearing  colonic mucosa.  He was told to come back in 2 years for followup.  We  recommended genetic counseling/studies on multiple occasions previously,  but the patient has declined to have anything done up until now.  He  went to his uncle's birthday recently.  He has a bag related to colon  cancer and he wants to have genetic testing done.  This gentleman also  has a history of peptic stricture for which he underwent EGD with  dilation back at the same time of colonoscopy in 2007.  He did not have  Barrett's esophagus.  His reflux symptoms are well controlled on  omeprazole 20 mg orally daily.  He asked me to do a PSA on him as well  because he does not recall having one done previously.  He does not  currently have any lower GI tract symptoms; he is moving his bowels  every day and is not having any rectal bleeding or abdominal pain.   PAST MEDICAL HISTORY:  1. Reflux esophagitis with stricture.  2. History of allergies.  3. Osteoarthritis.  4. Anxiety neurosis.   PAST SURGICAL HISTORY:  1. Lazy eye - right eye  surgery.  2. Tympanostomy tubes.  3. Left knee surgery.  4. Multiple colonoscopies.  5. EGD.   He was hospitalized a couple of years ago for copperhead snake bite; he  has significant neurological deficits of right lower extremity.   CURRENT MEDICATIONS:  1. Aspirin 81 mg daily.  2. Fish oil 1200 mg daily.  3. Voltaren 75 mg daily.  4. Zocor daily.  5. Omeprazole daily.  6. Xanax 0.5 mg b.i.d.  7. Allegra 180 mg daily.  8. Sudafed p.r.n.   ALLERGIES:  No known drug allergies.   FAMILY HISTORY:  Father died at age 47 with MI.  Mother is alive at age  37 in good health.  He has one brother with colon cancer.  One brother  is deceased.  He has multiple cousins with colon cancer.   SOCIAL HISTORY:  The patient is single.  He has a daughter.  He is  employed with the [employer].  He quit smoking 8 months ago.  He consumes beer  on  a daily basis.   REVIEW OF SYSTEMS:  No chest pain or dyspnea on exertion.  No fever or  chills.   PHYSICAL EXAMINATION:  Reveals a 55 year old gentleman who appears at  his baseline.  Weight 236 pounds, height 6 feet, temperature 97.6, BP 140/82, and pulse  80.  SKIN:  Warm and dry.  HEENT:  No scleral icterus.  Conjunctivae are pink.  CHEST:  Lungs clear to auscultation.  CARDIAC:  Regular rate and rhythm without murmur, gallop, or rub.  ABDOMEN:  Nondistended.  Positive bowel sounds.  Soft, nontender.  No  appreciable mass or hepatosplenomegaly.  EXTREMITIES:  No edema.  RECTAL:  Deferred at the time of colonoscopy.   IMPRESSION:  Mr. Egelston is a pleasant 55 year old gentleman with a  personal history of colonic polyps and a marked positive history of  colon cancer in multiple family members.  He needs to have high-risk  screening now.  I have discussed the risks, benefits, alternatives, and  limitations with him at some length.  It is notable this gentleman takes  a large amount of conscious sedation for endoscopic procedures.  For  instance, for  his EGD and colonoscopy in 2007, he required Versed 10 mg,  Demerol 250 mg, and Phenergan 25 mg to achieve adequate conscious  sedation for both procedures.  I talked to him by going to the OR and  getting propofol, but he is content with conscious sedation as we did  previously, and that will be our approach.  He has a family tree at  home; I have asked him to get that together and to bring it with him so  we can decide which genetic studies are most appropriate.  At his  request, we will go ahead and do a PSA.      Jonathon Bellows, M.D.  Electronically Signed     RMR/MEDQ  D:  12/29/2007  T:  12/30/2007  Job:  [id number]   cc:   Kirk Ruths, M.D.  Fax: [phone]

## 2011-01-28 ENCOUNTER — Ambulatory Visit (HOSPITAL_COMMUNITY)
Admission: RE | Admit: 2011-01-28 | Discharge: 2011-01-28 | Disposition: A | Discharge status: Home / Self Care | Payer: Worker's Compensation | Source: Ambulatory Visit | Attending: *Deleted | Admitting: *Deleted

## 2011-01-30 ENCOUNTER — Ambulatory Visit (HOSPITAL_COMMUNITY)
Admission: RE | Admit: 2011-01-30 | Discharge: 2011-01-30 | Disposition: A | Discharge status: Home / Self Care | Payer: Worker's Compensation | Source: Ambulatory Visit | Attending: *Deleted | Admitting: *Deleted

## 2011-01-30 NOTE — Op Note (Signed)
[facility]  Patient:    Craig Scott, Craig Scott Visit Number: [id number] MRN: [id number]          Service Type: END Location: DAY Attending Physician:  Jonathon Bellows Dictated by:   Roetta Sessions, M.D. Proc. Date: 12/09/01 Admit Date:  12/09/2001   CC:         Patrica Duel, M.D.   Operative Report  PROCEDURE:  Surveillance colonoscopy.  ENDOSCOPIST:  Roetta Sessions, M.D.  INDICATIONS FOR PROCEDURE:  The patient is a 55 year old male who had a good-sized rectal polyp removed from his rectum last year.  He has a strikingly positive family history for colorectal cancer in multiple first degree relatives.  He now has no bowel symptoms.  He comes for early surveillance given his marked positive family history.  This approach has been discussed with the patient.  The potential risks, benefits, and alternatives have been reviewed; questions answered; he is agreeable.  Please see my handwritten H&P for more information.  PROCEDURE NOTE:  O2 saturation, blood pressure, pulse and respirations were monitored throughout the entire procedure.  CONSCIOUS SEDATION:  Versed 6 mg IV in divided doses, Demerol 125 mg IV in divided doses.  INSTRUMENT:  Olympus videochip colonoscope.  FINDINGS:  Digital rectal exam revealed no abnormalities.  VIDEOSCOPIC FINDINGS:  The prep was good.  RECTUM:  Examination of the rectal mucosa including a retroflex view of the anal verge revealed no abnormalities.  COLON:  The colonic mucosa was surveyed from the rectosigmoid junction through the left transverse right colon to the area of the appendiceal orifice, ileocecal valve, and cecum.  These junctures were well seen and photographed. The patient had a few scattered left-sided diverticula.  The remainder of the colonic mucosa all the way to the cecum appeared normal.  From this level the scope was slowly withdrawn.  All previously mentioned mucosal surfaces were again,  cautiously, examined; and I was unable to identify any other mucosal abnormalities.  The patient tolerated the procedure well and was reacted in endoscopy.  IMPRESSION: 1. Normal normal rectum. 2. A few scattered left-sided diverticula.  The remainder of the colonic    mucosa appeared normal.  RECOMMENDATIONS:  Repeat colonoscopy in 1 year. Dictated by:   Roetta Sessions, M.D. Attending Physician:  Jonathon Bellows DD:  12/09/01 TD:  12/10/01 Job: [id number]

## 2011-01-30 NOTE — Op Note (Signed)
   NAME:  Craig Scott, Craig Scott                        ACCOUNT NO.:  [id number]   MEDICAL RECORD NO.:  [id number]                   PATIENT TYPE:  AMB   LOCATION:  DAY                                  FACILITY:  [facility]   PHYSICIAN:  R. Roetta Sessions, M.D.              DATE OF BIRTH:  1956/06/22   DATE OF PROCEDURE:  12/15/2002  DATE OF DISCHARGE:                                 OPERATIVE REPORT   PROCEDURE:  Surveillance colonoscopy, high-risk.   INDICATION FOR PROCEDURE:  The patient is a 55 year old with marked family  history of colorectal neoplasia who has rapid recurrence of colonic adenomas  in between endoscopic evaluation.  Last colonoscopy one year, and he had  scattered left-sided diverticula.  The remainder of the colonic mucosa  appeared normal.  He is brought back now for high-risk screening.  This  approach has been discussed with him.  He has been devoid of any lower GI  tract symptoms.  Please see my H&P on the chart.   DESCRIPTION OF PROCEDURE:  O2 saturation, blood pressure, pulse, and  respirations were monitored throughout the entire procedure.   ANESTHESIA:  Conscious sedation with Versed 6 mg IV, Demerol 150 mg IV in  divided doses.   INSTRUMENT USED:  Olympus video chip adult colonoscope.   FINDINGS:  Digital rectal examination revealed no abnormalities.   Endoscopic findings:  The prep was good.   Rectum:  Examination of the rectal mucosa, including retroflexed vie of the  anal verge, revealed no abnormalities.   Colon:  The colonic mucosa was surveyed from the rectosigmoid junction  through the left, transverse, and right colon to the area of the appendiceal  orifice, ileocecal valve, and cecum.  These structures were well-seen and  photographed for the record.  There were a few scattered left-sided  diverticula but no other abnormalities were seen upon advancing the scope to  the cecum.  From the level of the cecum and ileocecal valve, the scope was  slowly, cautiously withdrawn.  All previously-mentioned mucosal surfaces  were again seen, and no other abnormalities were observed.  The patient  tolerated the procedure well and was reactive in endoscopy.   IMPRESSION:  Normal rectum, scattered left-sided diverticula.  The remainder  of the colonic mucosa appeared normal.   RECOMMENDATIONS:  Repeat colonoscopy one year                                               R. Roetta Sessions, M.D.   RMR/MEDQ  D:  12/15/2002  T:  12/16/2002  Job:  [id number]

## 2011-01-30 NOTE — H&P (Signed)
NAME:  GERMANY, CHELF              ACCOUNT NO.:  [id number]   MEDICAL RECORD NO.:  [id number]           PATIENT TYPE:  AMB   LOCATION:                                FACILITY:  [facility]   PHYSICIAN:  R. Roetta Sessions, M.D. DATE OF BIRTH:  Jun 07, 1956   DATE OF ADMISSION:  DATE OF DISCHARGE:  LH                                HISTORY & PHYSICAL   REASON FOR ADMISSION:  History of adenomatous polyps, left lower quadrant  pain, dysphagia.   HISTORY OF PRESENT ILLNESS:  Mr. Wolman is a 55 year old Caucasian male who  has been evaluated by Dr. Jena Gauss in the past. He has a history of rapidly  growing adenomatous polyps. She has a strong family history of colorectal  carcinoma. His mother developed colon cancer in her early 45s. He had a  cousin who developed colon cancer in the 15s. His brother developed colon  cancer at age 2. He also has a maternal uncle with colon cancer. He has had  an adenomatous polyp removed in 2002, which was on a stalk at 5 cm. In March  2003, he had a normal exam. He did have some left sided diverticula. Last  exam was April 10, 2004. He had sigmoid diverticula and otherwise normal  exam. It is felt that he may have Lynch family syndrome. About four months  ago, he developed left lower quadrant pain. He described the pain as  stabbing and sharp. It is intermittent, can last up to several hours. He  describes the pain 9/10 at worst. He has noted some dark stools with the  pain. He had a previous history of diarrhea. Had been taking some NuLev;  this did help some. Now, his symptoms are more consistent with constipation.  He can go a day or so without a bowel movement. He notes that certain foods  definitely make his symptoms worse. He denies any rectal bleeding. Denies  any fever or chills. He is complaining of some fatigue.   He also notes intermittent dysphagia to solid foods. He feels like his food  gets stuck in his upper esophagus, especially he eats barbeque  sandwiches,  occasionally with steaks. He denies any problems with liquids. He notes he  has had heartburn and indigestion which has been fairly frequent. He states  he is eating Rolaids and taking Pepcid like candy for the last month.   PAST MEDICAL HISTORY:  1.  Seasonal allergies.  2.  Left eye surgery.  3.  Nasal surgery.  4.  Left ear surgery.  5.  Left knee surgery.  6.  He has history of IBS.  7.  He has a personal history of rapidly growing adenomatous polyps with a      strong family history of colon cancer, felt to possible have Lynch      syndrome.   CURRENT MEDICATIONS:  1.  Aspirin 81 mg daily.  2.  Voltaren 75 mg b.i.d.  3.  Vytorin 10/20 mg daily.  4.  Hyoscyamine 0.125 mg a.c. and h.s.  5.  Xanax 0.5 mg nightly.  6.  Hydrocodone  10/650 mg p.r.n.  7.  Entex LA b.i.d. p.r.n.  8.  Allegra 180 mg 1/2 tablet p.o. daily.  9.  __________ 1/2 to 1 b.i.d. p.r.n.   ALLERGIES:  No known drug allergies.   FAMILY HISTORY:  As described in HPI. Significant for colorectal carcinoma.   SOCIAL HISTORY:  Mr. Hallahan has been divorced twice. He lives alone. He has  two grown healthy children. 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   REVIEW OF SYSTEMS:  CONSTITUTIONAL:  Weight is stable. Denies anorexia or  early satiety. CARDIOVASCULAR:  Denies any chest pain or palpitations.  PULMONARY:  Denies any shortness of breath, dyspnea, cough or hemoptysis.  GASTROINTESTINAL:  See HPI.   PHYSICAL EXAMINATION:  VITAL SIGNS:  Weight 232 pounds, height 72 inches,  temperature 98.4, blood pressure 130/84, pulse 88.  GENERAL:  Mr. Beevers is a 55 year old Caucasian male who is alert and  oriented, pleasant and cooperative in no acute distress.  HEENT:  Sclerae are clear, nonicteric. Conjunctivae are pink. Oral mucosa  pink and moist without any lesions.  NECK:  Supple without any masses or thyromegaly.  CHEST:  Heart regular rate and rhythm with normal S1 and S2  without murmurs,  clicks, rubs or gallops.  LUNGS:  Clear to auscultation bilaterally.  ABDOMEN:  Protuberant with positive bowel sounds x4. No bruits auscultated.  Nondistended. He does have significant tenderness in the left upper quadrant  on deep palpation. There is no rebound tenderness or guarding. No  hepatosplenomegaly or masses but extremely limited given the patient's body  habitus.  RECTAL:  Deferred.  EXTREMITIES:  Without clubbing or edema bilaterally.  SKIN:  Pink, warm and dry without any rash or jaundice.   ASSESSMENT:  Mr. Putt is a 55 year old Caucasian male with a four-month  history of intermittent left lower quadrant pain which is quite severe at  times. He has also noted more frequent constipation and has had an  occasional dark stool. I would be concerned about development of  diverticulitis. He also has history of rapidly growing adenomatous rectal  polyp with a very strong family history of colorectal carcinoma, possibly  Lynch syndrome. He is due for repeat colonoscopy this year as well.   He has a new onset of dysphagia over the last month along with refractory  GERD symptoms that are not responding to on-demand H2 blocker. I suspect he  may have developed gastroesophageal reflux disease and possibly developed  web, ring or stricture. Further evaluation is necessary.   PLAN:  1.  Will obtain CT of abdomen and pelvis with IV and oral contrast as soon      as possible to rule out diverticulitis.  2.  If this clear, we will proceed with colonoscopy and EGD by Dr. Jena Gauss in      the near future. I have discussed both procedures including risks and      benefits which include but are not limited to bleeding, infection,      perforation and drug reaction. He will need to hold his aspirin two days      prior to the exam.  3.  He can continue NuLev on a p.r.n. basis. 4.  He has hydrocodone 10/650 mg at home to use for pain as needed.  5.  Further  recommendations pending CT.      Nicholas Lose, N.P.      Jonathon Bellows, M.D.  Electronically Signed    KC/MEDQ  D:  12/01/2005  T:  12/01/2005  Job:  [id number]   cc:   Kirk Ruths, M.D.  Fax: [phone]

## 2011-01-30 NOTE — Op Note (Signed)
NAME:  Craig Scott, Craig Scott              ACCOUNT NO.:  [id number]   MEDICAL RECORD NO.:  [id number]          PATIENT TYPE:  AMB   LOCATION:  DAY                           FACILITY:  [facility]   PHYSICIAN:  R. Roetta Sessions, M.D. DATE OF BIRTH:  [birth year]   DATE OF PROCEDURE:  12/23/2005  DATE OF DISCHARGE:                                 OPERATIVE REPORT   PROCEDURE:  Esophagogastroduodenoscopy with Elease Hashimoto dilation followed by  high-risk screening colonoscopy.   INDICATIONS FOR PROCEDURE:  The patient is a 55 year old gentleman with  gastroesophageal reflux disease, now esophageal dysphagia to solids.  He has  a personal history of colonic adenomatous polyps and a striking family  history of colorectal cancer.  Colonoscopy is now being done as a high-risk  surveillance maneuver.  He has alternating constipation and diarrhea,  postprandial abdominal cramps consistent with IBS.  The procedure was  discussed with the patient at length, potential risks, benefits,  alternatives.  Questions were answered and he was agreeable.  Please see  document in the patient's medical record.   PROCEDURE NOTE:  O2 saturation, blood pressure, pulse, respiratory rate  monitored throughout the entire procedure.  Conscious sedation with Versed  10 mg IV, Demerol 250 mg IV, Phenergan 25 mg IV in divided doses prior to  the procedure.  Cetacaine spray for topical pharyngeal anesthesia.   INSTRUMENTATION:  Olympus video system.   FINDINGS:  EGD examination to the esophagus revealed a short peptic  stricture at the EG junction.  It looked to have been superimposed on a  Schatzki's ring.  There was a single 2-cm distal esophageal erosion.  There  is no Barrett's esophagus or evidence of neoplasia.  Scope easily traversed  this area.   Stomach:  Gastric cavity was emptied and insufflated well with air with  thorough examination of the gastric mucosa in a retroflexed view.  The  proximal stomach and esophagogastric  junction demonstrated small hiatal  hernia, patent.  Otherwise, gastric mucosa appeared normal.  Pylorus was  patent and easily traversed.  Examination of the second portion revealed a  couple bulbar erosions.  Otherwise, D1 and D2 appeared normal.   THERAPY/DIAGNOSTIC MANEUVERS PERFORMED:  A 56-French Maloney dilator was  passed to full insertion.  The stricture had been dilated without apparent  complication.  The patient tolerated the procedure well and was prepared for  colonoscopy.   Digital rectal examination revealed no abnormalities.   FINDINGS:  Prep was good.  Rectum:  Examination of the rectal mucosa in a  retroflexed view virtually revealed no abnormalities and revealed normal  colonic mucosa surface from the rectosigmoid junction to the left transverse  and right colon to the appendiceal orifice, ileocecal valve and cecum.  These structures were well seen and photographed for the record.  Olympus  scope was slowly and cautiously withdrawn. Colonic mucosa was meticulously  reviewed.  The patient had a few scattered sigmoid diverticula.  There was  no evidence of polyp or neoplasm.  The patient tolerated the procedures  well, was reacted in endoscopy.   IMPRESSION:  1.  Esophagogastric peptic  stricture with reflux esophagitis, status post      dilation as described above.  2.  Small hiatal hernia, otherwise normal stomach.  Bulbar erosions,      otherwise normal D1 and D2.   COLONOSCOPY FINDINGS:  Normal rectum, scattered sigmoid diverticula.  Colonic mucosa appeared normal.   RECOMMENDATIONS:  1.  The patient needs to be treated with a proton pump inhibitor.  Will      start him on Zegerid 40 mg orally daily.  2.  Antireflux list provided to Craig Scott.  3.  Will treat him more aggressively for IBS and start him on Pamine 14      tablets, one before meals and bedtime p.r.n. symptoms.  4.  Once again, family members should be screened for colorectal cancer.  5.  Mr.  Scott should probably go for genetic counseling at one of the      tertiary referral centers.  Will set him up an appointment.  For the      time being, would recommend he come back for another colonoscopy in two      years.      Jonathon Bellows, M.D.  Electronically Signed     RMR/MEDQ  D:  12/23/2005  T:  12/23/2005  Job:  [id number]   cc:   Kirk Ruths, M.D.  Fax: [phone]

## 2011-01-30 NOTE — Op Note (Signed)
NAME:  Craig Scott, Craig Scott                        ACCOUNT NO.:  [id number]   MEDICAL RECORD NO.:  [id number]                   PATIENT TYPE:  AMB   LOCATION:  DAY                                  FACILITY:  [facility]   PHYSICIAN:  R. Roetta Sessions, M.D.              DATE OF BIRTH:  Jan 10, 1956   DATE OF PROCEDURE:  04/10/2004  DATE OF DISCHARGE:                                 OPERATIVE REPORT   PROCEDURE:  Surveillance colonoscopy.   INDICATION FOR PROCEDURE:  The patient is a 55 year old gentleman with a  markedly positive family history of colorectal neoplasia in his mother, two  brothers, a maternal uncle, and at least one cousin.  He has a personal  history of documented relatively fast-growing rectal adenoma, which was  resected previously.  He is really not having any bowel symptoms now.  Last  colonoscopy based on review of the records was March 2003, when he was found  to have only left-sided diverticula.  Colonoscopy is now being done.  This  approach has been discussed with the patient, the potential risks, benefits,  and alternatives have been reviewed.  Please see my handwritten H&P.   PROCEDURE NOTE:  O2 saturation, blood pressure, pulse, and respirations were  monitored throughout the entire procedure.   CONSCIOUS SEDATION:  Versed 5 mg IV, Demerol 125 mg IV in divided doses.   INSTRUMENT USED:  Olympus video chip system.   FINDINGS:  Digital rectal exam revealed no abnormalities.   Endoscopic findings:  The prep was good.   Rectum:  Examination of the rectal mucosa, including retroflexed view of the  anal verge, revealed no mucosal abnormalities.   Colon:  The colonic mucosa was surveyed from the rectosigmoid junction  through the left, transverse, right colon, to the area of the appendiceal  orifice, ileocecal valve, and cecum.  These structures were well-seen and  photographed for the record.  From this level the scope was slowly withdrawn  and all  previously-mentioned mucosal surfaces were once again seen.  The  patient was noted to have sigmoid diverticula.  The remainder of the colonic  mucosa appeared normal.  The patient tolerated the procedure well, was  reacted in endoscopy.   IMPRESSION:  1. Normal rectum.  2. Sigmoid diverticula.  3. The remainder of the colonic mucosa appeared normal.   RECOMMENDATIONS:  1. At this point in time would recommend [name] return in two years for     surveillance examination.  2. Have recommended that he and his family members seek out genetic     counseling to further assess individual risk given there is likely a     hereditary component to the colon cancer running in his family.      ___________________________________________  Jonathon Bellows, M.D.   RMR/MEDQ  D:  04/10/2004  T:  04/10/2004  Job:  [id number]   cc:   Patrica Duel, M.D.  [address]  [place]  [place] [address]  Fax: [phone]

## 2011-02-02 ENCOUNTER — Ambulatory Visit (HOSPITAL_COMMUNITY)
Admission: RE | Admit: 2011-02-02 | Discharge: 2011-02-02 | Disposition: A | Discharge status: Home / Self Care | Payer: Worker's Compensation | Source: Ambulatory Visit | Attending: Orthopedic Surgery | Admitting: Orthopedic Surgery

## 2011-02-04 ENCOUNTER — Ambulatory Visit (HOSPITAL_COMMUNITY)
Admission: RE | Admit: 2011-02-04 | Discharge: 2011-02-04 | Disposition: A | Discharge status: Home / Self Care | Payer: Worker's Compensation | Source: Ambulatory Visit | Attending: Orthopedic Surgery | Admitting: Orthopedic Surgery

## 2011-02-05 ENCOUNTER — Encounter (INDEPENDENT_AMBULATORY_CARE_PROVIDER_SITE_OTHER): Payer: Worker's Compensation | Admitting: Psychology

## 2011-02-05 DIAGNOSIS — F411 Generalized anxiety disorder: Secondary | ICD-10-CM

## 2011-02-05 DIAGNOSIS — F329 Major depressive disorder, single episode, unspecified: Secondary | ICD-10-CM

## 2011-02-06 ENCOUNTER — Ambulatory Visit (HOSPITAL_COMMUNITY): Payer: Worker's Compensation | Admitting: Physical Therapy

## 2011-02-11 ENCOUNTER — Ambulatory Visit (HOSPITAL_COMMUNITY): Payer: Worker's Compensation | Admitting: Physical Therapy

## 2011-02-13 ENCOUNTER — Ambulatory Visit (HOSPITAL_COMMUNITY): Payer: Worker's Compensation | Admitting: Physical Therapy

## 2011-02-16 ENCOUNTER — Ambulatory Visit (HOSPITAL_COMMUNITY): Payer: Self-pay | Admitting: Physical Therapy

## 2011-02-18 ENCOUNTER — Ambulatory Visit (HOSPITAL_COMMUNITY): Payer: Self-pay | Admitting: Physical Therapy

## 2011-02-20 ENCOUNTER — Ambulatory Visit (HOSPITAL_COMMUNITY): Payer: Self-pay | Admitting: Physical Therapy

## 2011-02-23 ENCOUNTER — Ambulatory Visit (HOSPITAL_COMMUNITY): Payer: Self-pay | Admitting: Physical Therapy

## 2011-02-25 ENCOUNTER — Ambulatory Visit (HOSPITAL_COMMUNITY): Payer: Self-pay

## 2011-02-26 ENCOUNTER — Encounter (INDEPENDENT_AMBULATORY_CARE_PROVIDER_SITE_OTHER): Payer: Worker's Compensation | Admitting: Psychology

## 2011-02-26 DIAGNOSIS — F411 Generalized anxiety disorder: Secondary | ICD-10-CM

## 2011-02-26 DIAGNOSIS — F329 Major depressive disorder, single episode, unspecified: Secondary | ICD-10-CM

## 2011-02-27 ENCOUNTER — Ambulatory Visit (HOSPITAL_COMMUNITY): Payer: Self-pay

## 2011-03-02 ENCOUNTER — Ambulatory Visit (HOSPITAL_COMMUNITY): Payer: Self-pay | Admitting: Physical Therapy

## 2011-03-04 ENCOUNTER — Ambulatory Visit (HOSPITAL_COMMUNITY): Payer: Self-pay | Admitting: Physical Therapy

## 2011-03-06 ENCOUNTER — Ambulatory Visit (HOSPITAL_COMMUNITY): Payer: Self-pay | Admitting: Physical Therapy

## 2011-03-09 ENCOUNTER — Ambulatory Visit (HOSPITAL_COMMUNITY): Payer: Self-pay | Admitting: Physical Therapy

## 2011-03-11 ENCOUNTER — Ambulatory Visit (HOSPITAL_COMMUNITY): Payer: Self-pay

## 2011-03-13 ENCOUNTER — Ambulatory Visit (HOSPITAL_COMMUNITY): Payer: Self-pay

## 2011-08-11 ENCOUNTER — Ambulatory Visit (HOSPITAL_COMMUNITY)
Admission: RE | Admit: 2011-08-11 | Discharge: 2011-08-11 | Disposition: A | Discharge status: Home / Self Care | Payer: Worker's Compensation | Source: Ambulatory Visit | Attending: Orthopedic Surgery | Admitting: Orthopedic Surgery

## 2011-08-11 NOTE — Progress Notes (Signed)
Physical Therapy Evaluation  Patient Details  Name: Craig Scott MRN: [id number] Date of Birth: 09-Jun-1956  Today's Date: 08/11/2011 Time: 5784-6962 Time Calculation (min): 41 min Charges: 1 eval Visit#: 1  of 8   Re-eval: 09/10/11 Assessment Diagnosis: Knee Pain Next MD Visit: 09/19/10 Prior Therapy: After TKR   Subjective Symptoms/Limitations Symptoms: Pt reports that his L knee is extremly painful, especially to the knee joint and in the hamstring area.  He reports pain in BLE (L>R). He is unable to kneel on his knees.  He had fluid taken off of his R knee a few weeks ago and injected it with cortisone.  Pain Assessment Currently in Pain?: Yes Pain Score:   6 Pain Location: Knee Pain Orientation: Right;Left Pain Type: Acute pain  08/11/11 1500  Assessment  Diagnosis Knee Pain  Next MD Visit 09/19/10  Prior Therapy After TKR. None for OA of L knee.   Prior Function  Able to Take Stairs? Reciprically  Driving Yes  Vocation Full time employment  Vocation Requirements Landscaping   Leisure Hobbies-yes (Comment)  Comments Enjoys traveling, being with his wife and enjoying outdoor activites.   Functional Tests  Functional Tests Observation: + Valgus stress test, with increased fascial restricitons through the medial side of the knee, increased knee pain with R SLR knee external rotation. Decreased B: hip IR and ER.  Functional Tests Palpation: Increased fascial restrictions thorughout L medial knee joint and R insertion of lateral hamstring.   RLE Strength  RLE Overall Strength Comments WNL   LLE Strength  LLE Overall Strength Comments WNL  Ambulation/Gait  Ambulation/Gait Yes  Gait Pattern Decreased trunk rotation      Exercise/Treatments Stretches Active Hamstring Stretch: 30 seconds;Limitations Active Hamstring Stretch Limitations: BLE Seated Other Seated Knee Exercises: Heel and Toe Roll in and outs 10x Supine Bridges: 10 reps;Limitations Bridges  Limitations: With pelvic rotation Straight Leg Raise with External Rotation: 10 reps Other Supine Knee Exercises: Heel and Toe Roll in and Outs 10x  Physical Therapy Assessment and Plan PT Assessment and Plan Clinical Impression Statement: Pt is a 55 y.o male referred to PT for B knee pain secondary to R TKR and L knee OA.  After examination it was found that the patient has current body structure impairments including increased L medial knee pain and R hamstring pain, impaired BLE flexibility, decreased BLE hip ER and IR, decreased pelvic rotation and increased fascial restrictions to his LLE which are likely due to improper gait mechanics following his R TKR and are limiting his ability to participate in community and work related activities.  Pt will benefit from skilled physical therapy service to address the above body structure impairments in order to maximize function in order to improve quality of life. Rehab Potential: Good PT Frequency: Min 2X/week PT Duration: 4 weeks PT Treatment/Interventions: Gait training;Stair training;Functional mobility training;Therapeutic exercise;Patient/family education;Other (comment) (Manual and modalities for pain control ) PT Plan: Add: ITB stretch, piriformis stretch, pelvic tilts on the ball,     Goals Home Exercise Program Pt will Perform Home Exercise Program: Independently PT Short Term Goals Time to Complete Short Term Goals: 4 weeks PT Short Term Goal 1: Pt will report pain less than or equal to 3/10 for 75% of his working day for improved quality of life.  PT Short Term Goal 2: Pt will improve hip ER and IR to [facility] in order to ambulate with appropriate gait mechanics to decrease risk of secondary injury.  PT Short Term Goal  3: Pt will present without fascial restrictions to his L medial knee.   Problem List There is no problem list on file for this patient.   PT - End of Session Activity Tolerance: Patient tolerated treatment well   Thetis Schwimmer 08/11/2011, 4:37 PM  Physician Documentation Your signature is required to indicate approval of the treatment plan as stated above.  Please sign and either send electronically or make a copy of this report for your files and return this physician signed original.   Please mark one 1.__approve of plan  2. ___approve of plan with the following conditions.   ______________________________                                                          _____________________ Physician Signature                                                                                                             Date

## 2011-08-12 DIAGNOSIS — M25569 Pain in unspecified knee: Secondary | ICD-10-CM | POA: Insufficient documentation

## 2011-08-14 ENCOUNTER — Ambulatory Visit (HOSPITAL_COMMUNITY)
Admission: RE | Admit: 2011-08-14 | Discharge: 2011-08-14 | Disposition: A | Discharge status: Home / Self Care | Payer: Worker's Compensation | Source: Ambulatory Visit | Attending: Orthopedic Surgery | Admitting: Orthopedic Surgery

## 2011-08-14 NOTE — Progress Notes (Signed)
Physical Therapy Treatment Patient Details  Name: Craig Scott MRN: [id number] Date of Birth: September 06, 1956  Today's Date: 08/14/2011 Time: 7829-5621 Time Calculation (min): 55 min Visit#: 2  of 8   Re-eval: 09/10/11  Charge: therex 33 min [place] 8 min  Subjective: Symptoms/Limitations Symptoms: Pt reports B knee pain scale 6/10 with increased edema, really tight hamstings messing up gait. Pain Assessment Currently in Pain?: Yes Pain Score:   6 Pain Location: Knee Pain Orientation: Right;Left  Objective:   Exercise/Treatments Stretches Active Hamstring Stretch: 3 reps;60 seconds;Limitations Active Hamstring Stretch Limitations: BLE with rope Aerobic Stationary Bike: 6' @ 6.0 Standing Wall Squat: 10 reps;10 seconds;Limitations Wall Squat Limitations: w/ ball between knees for VMO/adductor strengthening Supine Bridges: 10 reps Other Supine Knee Exercises: Heel and Toe Roll in and Outs 10x5" holds on ball  Modalities Modalities: Ultrasound Ultrasound Ultrasound Location: R medial and lateral knee; L medial and anterior knee Ultrasound Parameters: 0.8 w/cm2 pulsed x 8 min each knee Ultrasound Goals: Pain  Physical Therapy Assessment and Plan PT Assessment and Plan Clinical Impression Statement: Added VMO strengthening therex and continued with hamstring stretches for B LE flexibility.  Pt given rope with h/s st and stated he felt better stretch.  Completed US prior stretches wtih no pain B knees supine and  pain scale 3/10 while standing. PT Plan: Continue with current POC; Add: ITB stretch, piriformis stretch, pelvic tilts on the ball next session.    Goals    Problem List Patient Active Problem List  Diagnoses  . Knee pain    PT - End of Session Activity Tolerance: Patient tolerated treatment well General Behavior During Session: [facility] for tasks performed Cognition: [facility] for tasks performed  [name] 08/14/2011, 4:25 PM

## 2011-08-17 ENCOUNTER — Ambulatory Visit (HOSPITAL_COMMUNITY)
Admission: RE | Admit: 2011-08-17 | Discharge: 2011-08-17 | Disposition: A | Discharge status: Home / Self Care | Payer: Worker's Compensation | Source: Ambulatory Visit | Attending: Family Medicine | Admitting: Family Medicine

## 2011-08-17 DIAGNOSIS — M25569 Pain in unspecified knee: Secondary | ICD-10-CM | POA: Insufficient documentation

## 2011-08-17 DIAGNOSIS — R262 Difficulty in walking, not elsewhere classified: Secondary | ICD-10-CM | POA: Insufficient documentation

## 2011-08-17 DIAGNOSIS — IMO0001 Reserved for inherently not codable concepts without codable children: Secondary | ICD-10-CM | POA: Insufficient documentation

## 2011-08-17 DIAGNOSIS — M6281 Muscle weakness (generalized): Secondary | ICD-10-CM | POA: Insufficient documentation

## 2011-08-17 NOTE — Progress Notes (Signed)
Physical Therapy Treatment Patient Details  Name: Craig Scott MRN: [id number] Date of Birth: May 06, 1956  Today's Date: 08/17/2011 Time: 0865-7846 Time Calculation (min): 63 min Visit#: 3  of 8   Re-eval: 09/10/11 Charges:  therex  35' , ultrasound 16' total (8' to each knee)    Subjective: Symptoms/Limitations Symptoms: Pt. reports it did well until Saturday; currently hurting quite a bit.  7/10 L knee, 4/10 R knee. Pain Assessment Currently in Pain?: Yes Pain Score:  (see above subjective)   Exercise/Treatments Stretches Active Hamstring Stretch: 3 reps;60 seconds;Limitations Active Hamstring Stretch Limitations: BLE with rope ITB Stretch: 2 reps;60 seconds;Limitations ITB Stretch Limitations: standing and supine Piriformis Stretch: 2 reps;30 seconds Aerobic Stationary Bike: 6' @ 6.0 Standing Wall Squat: 15 reps Wall Squat Limitations: w/ ball between knees for VMO/adductor strengthening Supine Bridges: 15 reps Straight Leg Raise with External Rotation: 15 reps Other Supine Knee Exercises: Heel and Toe Roll in and Outs 15x5" holds on ball   Modalities Modalities: Ultrasound Ultrasound Ultrasound Location: R lateral knee and L  medial  knee Ultrasound Parameters: 0.8 w/cm2 pulsed X 8 minutes each knee Ultrasound Goals: Pain  Physical Therapy Assessment and Plan PT Assessment and Plan Clinical Impression Statement: Added ITB and piriformis stretch with noted tightness in B LE, Left tighter than Right.  Pt. with overall pain reduction following ultrasound.  Supine ITB stretch more effective than standing. PT Plan: Continue toward goals.     Problem List Patient Active Problem List  Diagnoses  . Knee pain    PT - End of Session Activity Tolerance: Patient tolerated treatment well General Behavior During Session: [facility] for tasks performed Cognition: [facility] for tasks performed  [name] 08/17/2011, 5:45 PM

## 2011-08-19 ENCOUNTER — Ambulatory Visit (HOSPITAL_COMMUNITY)
Admission: RE | Admit: 2011-08-19 | Discharge: 2011-08-19 | Disposition: A | Discharge status: Home / Self Care | Payer: Worker's Compensation | Source: Ambulatory Visit | Attending: Family Medicine | Admitting: Family Medicine

## 2011-08-19 NOTE — Progress Notes (Signed)
Physical Therapy Treatment Patient Details  Name: Craig Scott MRN: [id number] Date of Birth: [birth year]  Today's Date: 08/19/2011 Time: 4696-2952 Time Calculation (min): 58 min Visit#: 4  of 8   Re-eval: 09/10/11 Charges:  therex 38', ultrasound 14'    Subjective: Symptoms/Limitations Symptoms: Pt states he is complete opposite of last treatment.  States his Right knee is hurting worse than his Left today.  (R 7/10, L 4/10)  Exercise/Treatments Stretches Active Hamstring Stretch: 3 reps;60 seconds;Limitations Active Hamstring Stretch Limitations: BLE with rope ITB Stretch: 2 reps;60 seconds;Limitations ITB Stretch Limitations: standing and supine Piriformis Stretch: 2 reps;30 seconds Aerobic Stationary Bike: 6' @ 6.0 Standing Wall Squat: 20 reps;5 seconds Wall Squat Limitations: w/ ball between knees for VMO/adductor strengthening SLS with Vectors: 5X5" each LE Supine Bridges: 15 reps Straight Leg Raise with External Rotation: 15 reps   Modalities Modalities: Ultrasound Ultrasound Ultrasound Location: R lateral knee, L medial knee Ultrasound Parameters: 1.2 w/cm2 X 6 minutes each knee Ultrasound Goals: Pain  Physical Therapy Assessment and Plan PT Assessment and Plan Clinical Impression Statement: Able to increase reps today without difficulty.  Requires VC's to perform exercises safely and correctly.  Pt. reported no pain at end of session.  Added vector stance for B LE's with noted weakness and fatique PT Plan: Continue current POC.    Problem List Patient Active Problem List  Diagnoses  . Knee pain    PT - End of Session Activity Tolerance: Patient tolerated treatment well General Behavior During Session: [facility] for tasks performed Cognition: [facility] for tasks performed  [name] 08/19/2011, 5:34 PM

## 2011-08-24 ENCOUNTER — Ambulatory Visit (HOSPITAL_COMMUNITY)
Admission: RE | Admit: 2011-08-24 | Discharge: 2011-08-24 | Disposition: A | Discharge status: Home / Self Care | Payer: Worker's Compensation | Source: Ambulatory Visit | Attending: Family Medicine | Admitting: Family Medicine

## 2011-08-24 NOTE — Progress Notes (Signed)
Physical Therapy Treatment Patient Details  Name: Craig Scott MRN: [id number] Date of Birth: 02-16-1956  Today's Date: 08/24/2011 Time: 4098-1191 Time Calculation (min): 55 min Charges: [place] 15', TE Visit#: 5  of 8   Re-eval: 09/10/11    Subjective: Symptoms/Limitations Symptoms: Pt reports that everyday is different and today his R knee (6/10)  is more painful then his L today (3/10).   Pain Assessment Currently in Pain?: Yes Pain Score:   6 Pain Location: Knee Pain Orientation: Right;Left   Exercise/Treatments Stretches Active Hamstring Stretch: 2 reps;60 seconds Active Hamstring Stretch Limitations: BLE with rope Hip Flexor Stretch: 2 reps;30 seconds;Limitations Hip Flexor Stretch Limitations: BLE, with pelvic rotation 2x10 sec hold both directions ITB Stretch: 2 reps;60 seconds;Limitations ITB Stretch Limitations: standing and supine BLE Piriformis Stretch: 2 reps;30 seconds;Limitations Piriformis Stretch Limitations: Supine in figure 4 BLE Standing Wall Squat: 20 reps;5 seconds Wall Squat Limitations: w/grn ball behind back SLS with Vectors: 5X5" each LE Supine Bridges: Both;10 reps;Limitations Bridges Limitations: w/pelvic rotation Other Supine Knee Exercises: Heel and Toe Roll in and Outs 1x5" holds on ball  Modalities Modalities: Ultrasound Ultrasound Ultrasound Location: R lateral knee, L medial knee  Ultrasound Parameters: 3 mHz, pulsed, 1.2 w/cm2 X 6 minutes each knee  Ultrasound Goals: Pain  Physical Therapy Assessment and Plan PT Assessment and Plan Clinical Impression Statement: Started with [place] today to control pain, followed by stretching and end with exercise.  He has notable decreased pelvic rotation to his L hip which is likely causing the greatest amount of gait abnormality.  Pt continues to have decreased pain after treatment (R: 4/10; L 0/10) PT Plan: Cont to progress and decrease pain.    Goals    Problem List Patient Active Problem  List  Diagnoses  . Knee pain    PT - End of Session Activity Tolerance: Patient tolerated treatment well  Somara Frymire 08/24/2011, 4:16 PM

## 2011-08-26 ENCOUNTER — Ambulatory Visit (HOSPITAL_COMMUNITY)
Admission: RE | Admit: 2011-08-26 | Discharge: 2011-08-26 | Disposition: A | Discharge status: Home / Self Care | Payer: Worker's Compensation | Source: Ambulatory Visit | Attending: Family Medicine | Admitting: Family Medicine

## 2011-08-26 NOTE — Progress Notes (Signed)
Physical Therapy Treatment Patient Details  Name: Craig Scott MRN: [id number] Date of Birth: 11/11/1955  Today's Date: 08/26/2011 Time: 1520-1601 Time Calculation (min): 41 min Charges: 15' [place], 23' TE Visit#: 6  of 8   Re-eval: 09/10/11    Subjective: Symptoms/Limitations Symptoms: Pt reports that he is doing his exercises.  He reports his pain switched to the L side today.  L: 7/10, R 3/10 Pain Assessment Currently in Pain?: Yes  Exercise/Treatments Stretches Active Hamstring Stretch: 2 reps;60 seconds Active Hamstring Stretch Limitations: BLE  Hip Flexor Stretch: 2 reps;30 seconds;Limitations Hip Flexor Stretch Limitations: BLE, with pelvic rotation 2x10 sec hold both directions ITB Stretch: 2 reps;60 seconds;Limitations ITB Stretch Limitations: standing and supine BLE Piriformis Stretch: 2 reps;30 seconds;Limitations Piriformis Stretch Limitations: Supine in figure 4 BLE, manual overpressure   Ultrasound Ultrasound Location: R lateral knee, L medial knee Ultrasound Parameters: 3 mHz, pulsed, 1.2 w/cm2 X 6 minutes each knee   Physical Therapy Assessment and Plan PT Assessment and Plan Clinical Impression Statement: Pt tolerated all stretching activities well and had improved pelvic mobility after activities.  He was able to demonstrate improved balance and knee ROM after treatment today.  PT Plan: Cont w/US first and then stretching.    Goals    Problem List Patient Active Problem List  Diagnoses  . Knee pain    PT - End of Session Activity Tolerance: Patient tolerated treatment well  Zanyla Klebba 08/26/2011, 4:02 PM

## 2011-08-31 ENCOUNTER — Ambulatory Visit (HOSPITAL_COMMUNITY)
Admission: RE | Admit: 2011-08-31 | Discharge: 2011-08-31 | Disposition: A | Discharge status: Home / Self Care | Payer: Worker's Compensation | Source: Ambulatory Visit | Attending: Family Medicine | Admitting: Family Medicine

## 2011-09-01 NOTE — Progress Notes (Signed)
Physical Therapy Treatment Patient Details  Name: Craig Scott MRN: [id number] Date of Birth: [birth year]  Today's Date: 09/01/2011 Time: 6962-9528 Time Calculation (min): 48 min Visit#: 7  of 8   Re-eval: 09/10/11    Subjective: Symptoms/Limitations Symptoms: Pt reports that he continues to have pain in his R knee today and his left is doing much better overall. Pain Assessment Currently in Pain?: Yes Pain Location: Knee Pain Orientation: Right  Exercise/Treatments Stretches Active Hamstring Stretch: 2 reps;60 seconds Active Hamstring Stretch Limitations: BLE  Hip Flexor Stretch: 2 reps;30 seconds;Limitations Hip Flexor Stretch Limitations: BLE, with pelvic rotation 2x10 sec hold both directions ITB Stretch: 2 reps;60 seconds;Limitations ITB Stretch Limitations: standing and supine BLE Piriformis Stretch: 2 reps;30 seconds;Limitations Piriformis Stretch Limitations: Supine in figure 4 BLE, manual overpressure Gastroc Stretch: 3 reps;30 seconds;Limitations Gastroc Stretch Limitations: BLE Standing Other Standing Knee Exercises: Hip Pelvic Mobility (weight shifting forward and back with pelvic turning 10x each direction Supine Other Supine Knee Exercises: Psoas Stretch on EOB w/manual overpressure 2x30 sec BLE  Modalities Modalities: Ultrasound Ultrasound Ultrasound Location: R lateral knee Ultrasound Parameters: 3 mHz, pulsed, 1.0 w/cm2 x8 minutes Ultrasound Goals: Pain  Physical Therapy Assessment and Plan PT Assessment and Plan Clinical Impression Statement: Add EOB psoas stretch.  Continues to demonstrate decreased LE flexibility, however is improving overall pelvic mobility.  PT Plan: Cont with stretching/flexibility and [place] at the end of treatment     Problem List Patient Active Problem List  Diagnoses  . Knee pain    PT - End of Session Activity Tolerance: Patient tolerated treatment well  [name] 09/01/2011, 8:01 AM

## 2011-09-02 ENCOUNTER — Ambulatory Visit (HOSPITAL_COMMUNITY)
Admission: RE | Admit: 2011-09-02 | Discharge: 2011-09-02 | Disposition: A | Discharge status: Home / Self Care | Payer: Worker's Compensation | Source: Ambulatory Visit | Attending: Family Medicine | Admitting: Family Medicine

## 2011-09-02 NOTE — Progress Notes (Addendum)
Physical Therapy Treatment- Progress Note Patient Details  Name: Craig Scott MRN: [id number] Date of Birth: 1956/09/01  Today's Date: 09/02/2011 Time: 9629-5284 Time Calculation (min): 53 min Charges: 25' Manual, 15' TE, 1 estim Visit#: 8  of 16   Re-eval: 10/02/11    Subjective: Symptoms/Limitations Symptoms: Pt reports that his average pain is about 6/10 to his R knee.  When walking into therapy his pain to R knee is a 3/10.  Reports no pain after manual therapy and exercises.  Asked pt to walk to illicit pain, he reports most of his pain was to his R joint line, therefore utilized e-stim to decrease pain.   Pain Assessment Currently in Pain?: Yes Pain Score:   3 Pain Location: Knee Pain Orientation: Right  Mobility (including Balance)   Pt continues to ambulate with slight decrease in pelvic rotation to the R side.      Exercise/Treatments Stretches Active Hamstring Stretch: 5 reps;60 seconds;Limitations Active Hamstring Stretch Limitations: 2x30 to LLE, 3x30 independent, 2x60 to RLE manual Hip Flexor Stretch: 3 reps;60 seconds;Limitations Hip Flexor Stretch Limitations: RLE on off EOB  Piriformis Stretch: 3 reps;60 seconds;Limitations Piriformis Stretch Limitations: Supine in figure 4 with manual assistance RLE only Standing Other Standing Knee Exercises: Hip Pelvic Mobility (weight shifting forward and back with pelvic turning 10x each direction Supine Other Supine Knee Exercises: legs extended manual help for R hip ER and IR (under manual therapy)   Modalities Modalities: Electrical Stimulation Manual Therapy Manual Therapy: Other (comment) Other Manual Therapy: manual therapy to improve RIGHT hip ER and IR while laying on bed which involved flexibility exercises (manual HS stretch, piriformis stretch, leg extended with hip ER and IR)  Grade II-III joint mobs to improve hip and pelvic movement, distraction to R hip x25 minutes Electrical Stimulation Electrical  Stimulation Location: IFES to R knee to decrease pain to joint line Electrical Stimulation Action: decrease pain Electrical Stimulation Parameters: 10.5 volts Electrical Stimulation Goals: Pain  Physical Therapy Assessment and Plan PT Assessment and Plan Clinical Impression Statement: Progress Note complete.  Mr. Hyde has attended 8 OPPT visits and has met 2/4 STG and making progress towards his remaining STG and LTG.  He continues have the greatest impairment of pain (avg 6/10) however it is mostly to his R knee and is decreased to his L knee overall.  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  He will  continue to benefit from skilled OPPT in order to improve his overall R LE flexibility and gait mechanics in order to improve his overall quality of life.  PT Frequency: Min 2X/week PT Duration: 4 weeks PT Treatment/Interventions: Therapeutic exercise;Therapeutic activities;Patient/family education;Gait training (manaual and modalities for pain and ROM) PT Plan: Cont to address RLE flexibility and utilize manual techniques to improve flexibility.     Goals Home Exercise Program Pt will Perform Home Exercise Program: Independently PT Goal: Perform Home Exercise Program - Progress: Met PT Short Term Goals Time to Complete Short Term Goals: 4 weeks PT Short Term Goal 1: Pt will report pain less than or equal to 3/10 for 75% of his working day for improved quality of life.  PT Short Term Goal 1 - Progress: Not met PT Short Term Goal 2: Pt will improve hip ER and IR to [facility] in order to ambulate with appropriate gait mechanics to decrease risk of secondary injury.  PT Short Term Goal 2 - Progress: Progressing toward goal PT Short Term Goal 3: Pt will present without fascial restrictions to his L medial knee.  PT Short Term Goal 3 - Progress: Met PT Long Term Goals Time to  Complete Long Term Goals: 8 weeks PT Long Term Goal 1: Pt will improve LE flexibilty to WNL with reports of the ability to sit and stand for greater than 1 hour in order to tolerate work activities.  PT Long Term Goal 1 - Progress: Progressing toward goal PT Long Term Goal 2: Pt will present with decreased fascial restrictions to the R distal ITB.  PT Long Term Goal 2 - Progress: Progressing toward goal  Problem List Patient Active Problem List  Diagnoses  . Knee pain    PT - End of Session Activity Tolerance: Patient tolerated treatment well  Iracema Lanagan 09/02/2011, 4:12 PM

## 2011-09-07 ENCOUNTER — Ambulatory Visit (HOSPITAL_COMMUNITY): Payer: Self-pay | Admitting: Physical Therapy

## 2011-09-10 ENCOUNTER — Ambulatory Visit (HOSPITAL_COMMUNITY)
Admission: RE | Admit: 2011-09-10 | Discharge: 2011-09-10 | Disposition: A | Discharge status: Home / Self Care | Payer: Worker's Compensation | Source: Ambulatory Visit | Attending: Family Medicine | Admitting: Family Medicine

## 2011-09-10 NOTE — Progress Notes (Signed)
Physical Therapy Treatment Patient Details  Name: Craig Scott MRN: [id number] Date of Birth: 1956/06/09  Today's Date: 09/10/2011 Time: 6295-2841 Time Calculation (min): 56 min Visit#: 10  of 16   Re-eval: 10/02/11  Charge: therex 38 [place] 12 min   Subjective: Symptoms/Limitations Symptoms: Went for a walk this morning, B knee pain increased 6/10.  Pt stated more pain relief with [place] rather than IFES. Pain Assessment Currently in Pain?: Yes Pain Score:   6 Pain Location: Knee Pain Orientation: Right;Left  Objective:   Exercise/Treatments Stretches Active Hamstring Stretch: 5 reps;60 seconds;Limitations Active Hamstring Stretch Limitations: 2x30 to LLE, 3x30 independent, 2x60 to RLE manual Hip Flexor Stretch: 3 reps;60 seconds;Limitations Hip Flexor Stretch Limitations: BLE off EOB manual  Piriformis Stretch: 3 reps;60 seconds;Limitations Piriformis Stretch Limitations: Supine in figure 4 with manual assistance RLE only Aerobic Stationary Bike: 6', 7.5 Standing Other Standing Knee Exercises: Hip Pelvic Mobility (weight shifting forward and back with pelvic turning 10x each direction   Manual Therapy Other Manual Therapy: manual therapy to improve RIGHT hip ER and IR while laying on bed which involved flexibility exercises (manual HS stretch, piriformis stretch, leg extended with hip ER and IR) Grade II-III joint mobs to improve hip and pelvic movement, distraction to R hip x15 minutes  Ultrasound Ultrasound Location: B knees; L anterior, R anterior/ITB Ultrasound Parameters: 3 MHz pulsed 0.8w/cm x 6 min each knee Ultrasound Goals: Pain  Physical Therapy Assessment and Plan PT Assessment and Plan Clinical Impression Statement: Today's session focused on B LE flexibility, gait mechanics and pain reduction.  Pt with R hip pelvic mobility as greatest limitation.  Ended session with pulsed [place] B LE with pain resolved. PT Plan: Cont to address RLE flexibility and utilize  manual techniques to improve flexibility    Goals    Problem List Patient Active Problem List  Diagnoses  . Knee pain    PT - End of Session Activity Tolerance: Patient tolerated treatment well General Behavior During Session: [facility] for tasks performed Cognition: [facility] for tasks performed  [name] 09/10/2011, 1:03 PM

## 2011-09-17 ENCOUNTER — Ambulatory Visit (HOSPITAL_COMMUNITY)
Admission: RE | Admit: 2011-09-17 | Discharge: 2011-09-17 | Disposition: A | Discharge status: Home / Self Care | Payer: Worker's Compensation | Source: Ambulatory Visit | Attending: Family Medicine | Admitting: Family Medicine

## 2011-09-17 DIAGNOSIS — M25569 Pain in unspecified knee: Secondary | ICD-10-CM | POA: Insufficient documentation

## 2011-09-17 DIAGNOSIS — M6281 Muscle weakness (generalized): Secondary | ICD-10-CM | POA: Insufficient documentation

## 2011-09-17 DIAGNOSIS — IMO0001 Reserved for inherently not codable concepts without codable children: Secondary | ICD-10-CM | POA: Insufficient documentation

## 2011-09-17 DIAGNOSIS — R262 Difficulty in walking, not elsewhere classified: Secondary | ICD-10-CM | POA: Insufficient documentation

## 2011-09-17 NOTE — Progress Notes (Signed)
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  First day back to work in 14 days, able to tolerate standing for longer periods than I've used to for 2-3 hours. Pain Assessment Currently in Pain?: Yes Pain Score:   7 Pain Location: Knee Pain Orientation: Right;Left  Objective:   Exercise/Treatments Stretches Active Hamstring Stretch: 3 reps;60 seconds Active Hamstring Stretch Limitations: with rope Hip Flexor Stretch: 3 reps;60 seconds;Limitations Hip Flexor Stretch Limitations: BLE off EOB manual  ITB Stretch: 3 reps;30 seconds;Limitations ITB Stretch Limitations: Bilateral Aerobic Stationary Bike: 6', 7.5  Modalities Modalities: Ultrasound Ultrasound Ultrasound Location: B knees L anterior, R anterior ITB insertion point Ultrasound Parameters: 3 MHz pulsed 1.0 w/cm2 x 6 min each knee   Physical Therapy Assessment and Plan PT Assessment and Plan Clinical Impression Statement: Reassessment complete secondary to return to MD tomorrow.  Pt is progressing towards compliance with HEP, pain the same level as prior PT, flexibility has improved with hip IR/ER with normal gait mechanics and no fascial restrictions palpated distal ITB insertions.  Hamstrings continue to be tight.  Pt stated ability to stand 2-3 hours at one point of time with work, able to sit for 1 hour max.   PT Plan: Continue PT x 2 more weeks addressing flexibility and pain reduction.    Goals Home Exercise Program Pt will Perform Home Exercise Program: Independently PT Goal: Perform Home Exercise  Program - Progress: Progressing toward goal PT Short Term Goals Time to Complete Short Term Goals: 4 weeks PT Short Term Goal 1: Pt will report pain less than or equal to 3/10 for 75% of his working day for improved quality of life.  PT Short Term Goal 1 - Progress: Progressing toward goal PT Short Term Goal 2: Pt will improve hip ER and IR to [facility] in order to ambulate with appropriate gait mechanics to decrease risk of secondary injury.  PT Short Term Goal 2 - Progress: Met PT Short Term Goal 3: Pt will present without fascial restrictions to his L medial knee.  PT Short Term Goal 3 - Progress: Met PT Long Term Goals Time to Complete Long Term Goals: 8 weeks PT Long Term Goal 1: Pt will improve LE flexibilty to WNL with reports of the ability to sit and stand for greater than 1 hour in order to tolerate work activities.  PT Long Term Goal 1 - Progress: Progressing toward goal PT Long Term Goal 2: Pt will present with decreased fascial restrictions to the R distal ITB.  PT Long Term Goal 2 - Progress: Met  Problem List Patient Active Problem List  Diagnoses  . Knee pain    PT - End of Session Activity Tolerance: Patient tolerated treatment well General Behavior During Session: [facility] for tasks performed Cognition: [facility] for tasks performed   [name] 09/17/2011, 4:29 PM

## 2011-09-18 ENCOUNTER — Ambulatory Visit (HOSPITAL_COMMUNITY)
Admission: RE | Admit: 2011-09-18 | Discharge: 2011-09-18 | Disposition: A | Discharge status: Home / Self Care | Payer: Worker's Compensation | Source: Ambulatory Visit | Attending: Family Medicine | Admitting: Family Medicine

## 2011-09-18 NOTE — Progress Notes (Signed)
Physical Therapy Treatment Patient Details  Name: Craig Scott MRN: [id number] Date of Birth: [birth year]  Today's Date: 09/18/2011 Time: 1520-1600 Time Calculation (min): 40 min Visit#: 12  of 16   Re-eval: 10/02/11  Charge: therex 34 min  Subjective: Symptoms/Limitations Symptoms: Went to MD today found fluid on L knee and gave persciption to help with sleep.  Pain scale L knee 7/10 R 4/10.   Pain Assessment Currently in Pain?: Yes Pain Score:   7 Pain Location: Knee Pain Orientation: Left Multiple Pain Sites: Yes  Objective:   Exercise/Treatments Stretches Active Hamstring Stretch: 3 reps;60 seconds Active Hamstring Stretch Limitations: with rope Quad Stretch: 5 reps;30 seconds Hip Flexor Stretch: 3 reps;60 seconds;Limitations Hip Flexor Stretch Limitations: BLE off EOB manual  ITB Stretch: 3 reps;30 seconds;Limitations ITB Stretch Limitations: Bilateral Aerobic Stationary Bike: 6' @ 9.0   Physical Therapy Assessment and Plan PT Assessment and Plan Clinical Impression Statement: Session complete with just stretches today addressing flexibility impairments, pt wearing jeans and did not want to wear gown so wished to hold the [place] today.  Pt stated pain reduced followng stretches, pt asked to make mental note on pain scale without the [place].   PT Plan: Assess pain without [place], continue with current POC addressing flexibility and pain reduction.    Goals    Problem List Patient Active Problem List  Diagnoses  . Knee pain    PT - End of Session Activity Tolerance: Patient tolerated treatment well General Behavior During Session: [facility] for tasks performed Cognition: [facility] for tasks performed  [name] 09/18/2011, 4:07 PM

## 2011-09-21 ENCOUNTER — Ambulatory Visit (HOSPITAL_COMMUNITY)
Admission: RE | Admit: 2011-09-21 | Discharge: 2011-09-21 | Disposition: A | Discharge status: Home / Self Care | Payer: Worker's Compensation | Source: Ambulatory Visit | Attending: Family Medicine | Admitting: Family Medicine

## 2011-09-21 NOTE — Progress Notes (Signed)
Physical Therapy Treatment Patient Details  Name: Craig Scott MRN: [id number] Date of Birth: Feb 05, 1956  Today's Date: 09/21/2011 Time: 9147-8295 Time Calculation (min): 55 min Visit#: 13  of 16   Re-eval: 10/02/11 Charges:  therex 20', manual 15', ice 10'    Subjective: Symptoms/Limitations Symptoms: Pt. states his pain is 9/10 today in his L knee; states his L knee hurts so bad he is unable to tell how his R knee feels.  Pt. states he went back to MD but they did not remove the fluid around his knee. Pain Assessment Currently in Pain?: Yes Pain Score:   9 Pain Location: Knee Pain Orientation: Left   Exercise/Treatments Stretches Active Hamstring Stretch: 3 reps;60 seconds Active Hamstring Stretch Limitations: with rope ITB Stretch: 3 reps;30 seconds;Limitations ITB Stretch Limitations: Bilateral Aerobic Stationary Bike: 6' @ 9.0  Modalities Modalities: Cryotherapy Manual Therapy Manual Therapy: Massage Massage: Retro to L knee Cryotherapy Number Minutes Cryotherapy: 10 Minutes Cryotherapy Location: Knee Type of Cryotherapy: Ice pack  Physical Therapy Assessment and Plan PT Assessment and Plan Clinical Impression Statement: Pt. with difficulty completing exercises today due to pain; added retro massage to help decrease pain/swelling.  Pt. with notable pocket of swelling superior lateral L knee.  Pt. reported pain decreased to 3/10 at end of session. PT Plan: Continue X 3 more visits and re-eval . Add exercise back as tolerated.  Resume [place] if no relief with retro massage/ice.     Problem List Patient Active Problem List  Diagnoses  . Knee pain    PT - End of Session Activity Tolerance: Patient limited by pain General Behavior During Session: [facility] for tasks performed Cognition: [facility] for tasks performed  [name] 09/21/2011, 6:22 PM

## 2011-09-23 ENCOUNTER — Ambulatory Visit (HOSPITAL_COMMUNITY): Payer: Self-pay

## 2011-09-24 ENCOUNTER — Ambulatory Visit (HOSPITAL_COMMUNITY)
Admission: RE | Admit: 2011-09-24 | Discharge: 2011-09-24 | Disposition: A | Discharge status: Home / Self Care | Payer: Worker's Compensation | Source: Ambulatory Visit | Attending: Family Medicine | Admitting: Family Medicine

## 2011-09-24 NOTE — Progress Notes (Signed)
Physical Therapy Treatment Patient Details  Name: Craig Scott MRN: [id number] Date of Birth: October 21, 1955  Today's Date: 09/24/2011 Time: 4098-1191 Time Calculation (min): 59 min Visit#: 14  of 16   Re-eval: 10/02/11  Charge: therex 27 min Manual 16 min Ice 10 min  Subjective: Symptoms/Limitations Symptoms: Pt reported massage felt good at the end today.  Pt stated he is now getting used to always being in pain.  Pain scale L knee 7/10, R 5-6/10.  feels very tight today.  Have been icing and elevating my knees every day with relief following. Pain Assessment Currently in Pain?: Yes Pain Score:   7 Pain Location: Knee Pain Orientation: Left  Objective:   Exercise/Treatments Stretches Active Hamstring Stretch: 3 reps;60 seconds Active Hamstring Stretch Limitations: bilateral with rope Quad Stretch: 3 reps;30 seconds;Limitations Quad Stretch Limitations: bilateral ITB Stretch: 3 reps;30 seconds;Limitations ITB Stretch Limitations: Bilateral with rope Aerobic Stationary Bike: 6' @ 9.0   Modalities Modalities: Cryotherapy Manual Therapy Massage: Retro Bilateral knee 8 min each Cryotherapy Number Minutes Cryotherapy: 10 Minutes Cryotherapy Location: Knee Type of Cryotherapy: Ice pack  Physical Therapy Assessment and Plan PT Assessment and Plan Clinical Impression Statement: Continued with retra massage B knees to help with edema and pain. Pt continues to show notable pocket of swelling superior lateral L knee.  Pain scale 1/10 R knee, 2/10 L knee at end of session. PT Plan: Continue x 2 more visits and re-eval.  Assess pain relief next session.    Goals    Problem List Patient Active Problem List  Diagnoses  . Knee pain    PT - End of Session Activity Tolerance: Patient tolerated treatment well General Behavior During Session: [facility] for tasks performed Cognition: [facility] for tasks performed  [name] 09/24/2011, 4:25 PM

## 2011-09-28 ENCOUNTER — Ambulatory Visit (HOSPITAL_COMMUNITY)
Admission: RE | Admit: 2011-09-28 | Discharge: 2011-09-28 | Disposition: A | Discharge status: Home / Self Care | Payer: Worker's Compensation | Source: Ambulatory Visit

## 2011-09-28 NOTE — Progress Notes (Signed)
Physical Therapy Treatment Patient Details  Name: Craig Scott MRN: [id number] Date of Birth: Feb 22, 1956  Today's Date: 09/28/2011 Time: 4034-7425 Time Calculation (min): 51 min Visit#: 15  of 16   Re-eval: 10/02/11 Charges:  therex 28', manual 18'    Subjective: Symptoms/Limitations Symptoms: Pt. states the pain stayed away a while after last visit.  Reports 6/10 pain B knees today.  States he plans on returning to MD if doesnt get better. Pain Assessment Currently in Pain?: Yes Pain Score:   6 Pain Location: Knee Pain Orientation: Right;Left   Exercise/Treatments Stretches Active Hamstring Stretch: 3 reps;60 seconds Active Hamstring Stretch Limitations: bilateral with rope ITB Stretch: 3 reps;30 seconds;Limitations ITB Stretch Limitations: Bilateral with rope Aerobic Stationary Bike: 6' @ 9.0   Modalities Modalities: Cryotherapy Manual Therapy Manual Therapy: Massage Massage: Retro massage to Bilateral Knees 8 minutes each. Cryotherapy Number Minutes Cryotherapy: 10 Minutes Cryotherapy Location: Knee Type of Cryotherapy: Ice pack  Physical Therapy Assessment and Plan PT Assessment and Plan Clinical Impression Statement: Pocket of swelling continues to be present superior lateral L knee.  pain diminished following session.    Problem List Patient Active Problem List  Diagnoses  . Knee pain    PT - End of Session Activity Tolerance: Patient tolerated treatment well General Behavior During Session: [facility] for tasks performed Cognition: [facility] for tasks performed PT Plan of Care PT Home Exercise Plan: Re-evaluate next visit.  Devonda Pequignot B. Bascom Levels, PTA 09/28/2011, 4:37 PM

## 2011-09-30 ENCOUNTER — Ambulatory Visit (HOSPITAL_COMMUNITY)
Admission: RE | Admit: 2011-09-30 | Discharge: 2011-09-30 | Disposition: A | Discharge status: Home / Self Care | Payer: Worker's Compensation | Source: Ambulatory Visit | Attending: Family Medicine | Admitting: Family Medicine

## 2011-09-30 NOTE — Progress Notes (Addendum)
Physical Therapy Re-Evaluation  Patient Details  Name: Craig Scott MRN: [id number] Date of Birth: 18-Aug-1956  Today's Date: 09/30/2011 Time: 9147-8295 Time Calculation (min): 44 min Charges: 1 MMT, 1 ROM, 10 manual Visit#: 16  of 16   Re-eval: 10/02/11 Assessment Diagnosis: knee pain Next MD Visit: not sure.   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   Past Medical History: No past medical history on file. Past Surgical History: No past surgical history on file.  Subjective Symptoms/Limitations Symptoms: Still swelling, thinks that the retro massage is helping a lot with the swelling.   Limitations: Sitting;Other (comment) (squatting and kneeling) Special Tests: can't get on his knees or squatting secondary to pain.   Pain Assessment Currently in Pain?: Yes Pain Score:   7 Pain Location: Knee Pain Orientation: Right Pain Type: Chronic pain  Sensation/Coordination/Flexibility/Functional Tests Functional Tests Functional Tests: Observation: (-) valgus stress test now.  ITB flexibility R WFL, no more facial restricitions in knee, SLR right is still painful, but only when resistance is applied to leg.  Minimal decrease in bil hip ER/IR  Assessment RLE Assessment RLE Assessment: Within Functional Limits LLE Assessment LLE Assessment: Within Functional Limits  Exercise/Treatments Stretches Piriformis Stretch: 3 reps;30 seconds     Manual Therapy Manual Therapy: Edema management Edema Management: Retro massage to B knee x 8' each  Physical Therapy Assessment and Plan PT Assessment and Plan Clinical Impression Statement: Pain at end of session was 3/10 R knee, 4/10 L knee.  The patient has made good gains in flexibility and there do not seem to be any remaining facial restrictions in bil knees.  He continues to have pain and edema bil with left leg being worse than right leg.  The pain relief from stretches and massage during therapy seems to only be  temporary.  The patient has met 2/4 STGs and 1/2 LTGs.   PT Plan: D/C to HEP stretches, self icing and patien states he is returning to the MD to have fluid drawn off of left knee.      Goals Home Exercise Program Pt will Perform Home Exercise Program: Independently PT Goal: Perform Home Exercise Program - Progress: Met PT Short Term Goals Time to Complete Short Term Goals: 4 weeks PT Short Term Goal 1: Pt will report pain less than or equal to 3/10 for 75% of his working day for improved quality of life. PT Short Term Goal 1 - Progress: Not met PT Short Term Goal 2: Pt will improve hip ER and IR to [facility] in order to ambulate with appropriate gait mechanics to decrease risk of secondary injury. PT Short Term Goal 2 - Progress: Partly met PT Short Term Goal 3: Pt will present without fascial restrictions to his L medial knee. PT Short Term Goal 3 - Progress: Met PT Long Term Goals PT Long Term Goal 1: Pt will improve LE flexibilty to WNL with reports of the ability to sit and stand for greater than 1 hour in order to tolerate work activities. PT Long Term Goal 1 - Progress: Progressing toward goal PT Long Term Goal 2: Pt will present with decreased fascial restrictions to the R distal ITB.  PT Long Term Goal 2 - Progress: Met  Problem List Patient Active Problem List  Diagnoses  . Knee pain    PT - End of Session Activity Tolerance: Patient tolerated treatment well General Behavior During Session: Craig Scott for tasks performed Cognition: Craig Scott for tasks performed PT Plan of  Care PT Home Exercise Plan: continue hamstring and supine piriformis stretches.    Treatment performed by Seth Bake, PTA Re-eval performed by Rollene Rotunda. Taneya Conkel, PT, DPT  09/30/2011, 4:10 PM  Physician Documentation Your signature is required to indicate approval of the treatment plan as stated above.  Please sign and either send electronically or make a copy of this report for your files and return this  physician signed original.   Please mark one 1.__approve of plan  2. ___approve of plan with the following conditions.   ______________________________                                                          _____________________ Physician Signature                                                                                                             Date

## 2011-10-05 ENCOUNTER — Ambulatory Visit (HOSPITAL_COMMUNITY): Payer: Self-pay | Admitting: Physical Therapy

## 2011-10-07 ENCOUNTER — Ambulatory Visit (HOSPITAL_COMMUNITY): Payer: Self-pay | Admitting: *Deleted

## 2011-10-12 ENCOUNTER — Ambulatory Visit (HOSPITAL_COMMUNITY): Payer: Self-pay | Admitting: Physical Therapy

## 2011-10-14 ENCOUNTER — Ambulatory Visit (HOSPITAL_COMMUNITY): Payer: Self-pay | Admitting: Physical Therapy

## 2011-12-10 ENCOUNTER — Ambulatory Visit (HOSPITAL_COMMUNITY)
Admission: RE | Admit: 2011-12-10 | Discharge: 2011-12-10 | Disposition: A | Discharge status: Home / Self Care | Payer: BC Managed Care – PPO | Source: Ambulatory Visit | Attending: Internal Medicine | Admitting: Internal Medicine

## 2011-12-10 ENCOUNTER — Other Ambulatory Visit (HOSPITAL_COMMUNITY): Payer: Self-pay | Admitting: Internal Medicine

## 2011-12-10 DIAGNOSIS — M79609 Pain in unspecified limb: Secondary | ICD-10-CM | POA: Insufficient documentation

## 2011-12-10 DIAGNOSIS — M25569 Pain in unspecified knee: Secondary | ICD-10-CM

## 2012-05-19 ENCOUNTER — Encounter: Payer: Self-pay | Admitting: Internal Medicine

## 2012-06-16 ENCOUNTER — Emergency Department (HOSPITAL_COMMUNITY)
Admission: EM | Admit: 2012-06-16 | Discharge: 2012-06-16 | Disposition: A | Discharge status: Home / Self Care | Payer: BC Managed Care – PPO | Attending: Emergency Medicine | Admitting: Emergency Medicine

## 2012-06-16 ENCOUNTER — Encounter (HOSPITAL_COMMUNITY): Payer: Self-pay

## 2012-06-16 ENCOUNTER — Emergency Department (HOSPITAL_COMMUNITY): Payer: BC Managed Care – PPO

## 2012-06-16 DIAGNOSIS — R05 Cough: Secondary | ICD-10-CM | POA: Insufficient documentation

## 2012-06-16 DIAGNOSIS — B349 Viral infection, unspecified: Secondary | ICD-10-CM

## 2012-06-16 DIAGNOSIS — R21 Rash and other nonspecific skin eruption: Secondary | ICD-10-CM | POA: Insufficient documentation

## 2012-06-16 DIAGNOSIS — R059 Cough, unspecified: Secondary | ICD-10-CM | POA: Insufficient documentation

## 2012-06-16 DIAGNOSIS — B9789 Other viral agents as the cause of diseases classified elsewhere: Secondary | ICD-10-CM | POA: Insufficient documentation

## 2012-06-16 HISTORY — DX: Pure hypercholesterolemia, unspecified: E78.00

## 2012-06-16 LAB — BASIC METABOLIC PANEL
BUN: 11 mg/dL (ref 6–23)
Calcium: 9.9 mg/dL (ref 8.4–10.5)
Creatinine, Ser: 1.04 mg/dL (ref 0.50–1.35)
GFR calc Af Amer: >90 mL/min (ref >90)
GFR calc non Af Amer: 79 mL/min — ABNORMAL LOW (ref >90)
Glucose, Bld: 114 mg/dL — ABNORMAL HIGH (ref 70–99)

## 2012-06-16 LAB — CBC WITH DIFFERENTIAL/PLATELET
Basophils Relative: 0 % (ref 0–1)
Eosinophils Absolute: 0 10*3/uL (ref 0.0–0.7)
Eosinophils Relative: 0 % (ref 0–5)
Hemoglobin: 15.9 g/dL (ref 13.0–17.0)
Lymphs Abs: 0.7 10*3/uL (ref 0.7–4.0)
MCH: 30.9 pg (ref 26.0–34.0)
MCHC: 34.7 g/dL (ref 30.0–36.0)
MCV: 89.1 fL (ref 78.0–100.0)
Monocytes Absolute: 0.8 10*3/uL (ref 0.1–1.0)
Monocytes Relative: 7 % (ref 3–12)
Neutrophils Relative %: 86 % — ABNORMAL HIGH (ref 43–77)
RBC: 5.14 MIL/uL (ref 4.22–5.81)

## 2012-06-16 LAB — URINALYSIS, ROUTINE W REFLEX MICROSCOPIC
Bilirubin Urine: NEGATIVE
Glucose, UA: NEGATIVE mg/dL
Hgb urine dipstick: NEGATIVE
Protein, ur: 30 mg/dL — AB
Urobilinogen, UA: 1 mg/dL (ref 0.0–1.0)

## 2012-06-16 LAB — URINE MICROSCOPIC-ADD ON

## 2012-06-16 MED ORDER — SODIUM CHLORIDE 0.9 % IV BOLUS (SEPSIS)
1000.0000 mL | Freq: Once | INTRAVENOUS | Status: AC
  Administered 2012-06-16: 1000 mL via INTRAVENOUS

## 2012-06-16 MED ORDER — ACETAMINOPHEN 325 MG PO TABS
650.0000 mg | ORAL_TABLET | Freq: Once | ORAL | Status: AC
  Administered 2012-06-16: 650 mg via ORAL

## 2012-06-16 MED ORDER — SODIUM CHLORIDE 0.9 % IV SOLN: INTRAVENOUS | Status: DC

## 2012-06-16 NOTE — ED Notes (Signed)
Complain of fever, aching and " being sick as a dog ". States he went to Dr. Phillips Odor this and was told to hurry up and get over here he may have meninigitis

## 2012-06-16 NOTE — ED Provider Notes (Signed)
History   This chart was scribed for Craig Gaskins, MD by Sofie Rower. The patient was seen in room [facility]06[facility] and the patient's care was started at 8:06AM.     CSN: [id number]  Arrival date & time 06/16/12  0800   First MD Initiated Contact with Patient 06/16/12 [phone]      Chief Complaint  Patient presents with  . Fever     Patient is a 56 y.o. male presenting with fever. The history is provided by the patient. No language interpreter was used.  Fever Primary symptoms of the febrile illness include fever, headaches, abdominal pain, nausea, myalgias and rash. Primary symptoms do not include cough, vomiting, diarrhea or dysuria. The current episode started yesterday. This is a new problem. The problem has been gradually worsening.  The fever began yesterday. The fever has been gradually worsening since its onset. The maximum temperature recorded prior to his arrival was 103 to 104 F. The temperature was taken by an oral thermometer.  The headache began yesterday. The headache developed suddenly. Headache is a new problem. The headache is present rarely. The headache is not associated with photophobia, double vision or eye pain.  The abdominal pain began yesterday. The abdominal pain has been gradually worsening since its onset. The abdominal pain is generalized. The abdominal pain does not radiate.  Nausea began yesterday.  Myalgias began yesterday. The myalgias have been gradually worsening since their onset. The myalgias are generalized. The myalgias are aching.  The rash began more than 1 week ago. The rash appears on the right foot.     Craig Scott is a 56 y.o. male who presents to the Emergency Department complaining of sudden, progressively worsening, fever (103.9, taken at home yesterday, 103.1 taken at [facility] today), onset yesterday, with associated symptoms of shakiness, sore throat, headache, myalgias, constipation, diffuse abdominal pain. The pt reports he went to work  yesterday feeling sick, and by lunchtime he was shaking very severely. Modifying factors include swallowing which intensifies the sore throat and taking Advil which provides moderate relief of the fever. The pt has a hx of high cholesterol and knee surgery.  The pt denies vomiting, diarrhea, cough, dysuria, any recent tick bites, and any allergies to medications.   The pt does not smoke, however, he does drink alcohol.   PCP is Dr. Regino Schultze.    Past Medical History  Diagnosis Date  . High cholesterol     Past Surgical History  Procedure Date  . Knee surgery     No family history on file.  History  Substance Use Topics  . Smoking status: Not on file  . Smokeless tobacco: Not on file  . Alcohol Use: Yes      Review of Systems  Constitutional: Positive for fever.  Eyes: Negative for double vision, photophobia and pain.  Respiratory: Negative for cough.   Gastrointestinal: Positive for nausea and abdominal pain. Negative for vomiting and diarrhea.  Genitourinary: Negative for dysuria.  Musculoskeletal: Positive for myalgias.  Skin: Positive for rash.  Neurological: Positive for headaches.  All other systems reviewed and are negative.    Allergies  Review of patient's allergies indicates not on file.  Home Medications  No current outpatient prescriptions on file.  BP 138/77  Pulse 127  Temp 103.1 F (39.5 C) (Oral)  Resp 22  Ht 6' (1.829 m)  Wt 232 lb (105.235 kg)  BMI 31.47 kg/m2  SpO2 97%  Physical Exam  CONSTITUTIONAL: Well developed/well nourished HEAD AND  FACE: Normocephalic/atraumatic EYES: EOMI/PERRL ENMT: Mucous membranes moist, uvula midline NECK: supple no meningeal signs SPINE:entire spine nontender CV: S1/S2 noted, no murmurs/rubs/gallops noted LUNGS: Lungs are clear to auscultation bilaterally, no apparent distress ABDOMEN: soft, nontender, no rebound or guarding GU:no cva tenderness NEURO: Pt is awake/alert, moves all  extremitiesx4 EXTREMITIES: pulses normal, full ROM SKIN: warm, color normal. No petechiae noted PSYCH: no abnormalities of mood noted     ED Course  Procedures   DIAGNOSTIC STUDIES: Oxygen Saturation is 97% on room air, normal by my interpretation.    COORDINATION OF CARE:    8:22AM- Blood work, UA, and chest x-ray discussed with patient. Pt agrees with treatment.  Pt well appearing at this time.   10:30AM- Recheck. Blood work, UA, and chest x-ray results discussed with patient. Pt agrees with treatment.  He reported sore throat and headache/myalgias.  He has no signs of meningitis.  He is well appearing, doubt meningitis at this time.  He feels comfortable with d/c home.  We discussed strict return precautions.   Results for orders placed during the hospital encounter of 06/16/12  CBC WITH DIFFERENTIAL      Component Value Range   WBC 11.1 (*) 4.0 - 10.5 K/uL   RBC 5.14  4.22 - 5.81 MIL/uL   Hemoglobin 15.9  13.0 - 17.0 g/dL   HCT 96.0  45.4 - 09.8 %   MCV 89.1  78.0 - 100.0 fL   MCH 30.9  26.0 - 34.0 pg   MCHC 34.7  30.0 - 36.0 g/dL   RDW 11.9  14.7 - 82.9 %   Platelets 179  150 - 400 K/uL   Neutrophils Relative 86 (*) 43 - 77 %   Neutro Abs 9.5 (*) 1.7 - 7.7 K/uL   Lymphocytes Relative 7 (*) 12 - 46 %   Lymphs Abs 0.7  0.7 - 4.0 K/uL   Monocytes Relative 7  3 - 12 %   Monocytes Absolute 0.8  0.1 - 1.0 K/uL   Eosinophils Relative 0  0 - 5 %   Eosinophils Absolute 0.0  0.0 - 0.7 K/uL   Basophils Relative 0  0 - 1 %   Basophils Absolute 0.0  0.0 - 0.1 K/uL  BASIC METABOLIC PANEL      Component Value Range   Sodium 134 (*) 135 - 145 mEq/L   Potassium 3.8  3.5 - 5.1 mEq/L   Chloride 95 (*) 96 - 112 mEq/L   CO2 28  19 - 32 mEq/L   Glucose, Bld 114 (*) 70 - 99 mg/dL   BUN 11  6 - 23 mg/dL   Creatinine, Ser 5.62  0.50 - 1.35 mg/dL   Calcium 9.9  8.4 - 13.0 mg/dL   GFR calc non Af Amer 79 (*) >90 mL/min   GFR calc Af Amer >90  >90 mL/min  LACTIC ACID, PLASMA       Component Value Range   Lactic Acid, Venous 1.0  0.5 - 2.2 mmol/L   Dg Chest 2 View  06/16/2012  *RADIOLOGY REPORT*  Clinical Data: Fever  CHEST - 2 VIEW  Comparison: 09/24/2010  Findings: The heart size and mediastinal contours are within normal limits.  Both lungs are clear.  The visualized skeletal structures are unremarkable.  IMPRESSION: No acute disease.   Original Report Authenticated By: Rosealee Albee, M.D.         MDM  Nursing notes including past medical history and social history reviewed and considered in documentation  xrays reviewed and considered Labs/vital reviewed and considered       I personally performed the services described in this documentation, which was scribed in my presence. The recorded information has been reviewed and considered.      Craig Gaskins, MD 06/16/12 1606

## 2012-06-17 LAB — URINE CULTURE: Colony Count: 2000

## 2012-08-05 ENCOUNTER — Encounter: Payer: Self-pay | Admitting: Internal Medicine

## 2012-08-05 NOTE — Progress Notes (Signed)
Patient ID: Craig Scott, male   DOB: 08/11/1956, 56 y.o.   MRN: [id number] Records from [facility] have arrived. It appears that Mr. Suire finally underwent genetic testing and, not surprisingly, was found to have Lynch syndrome syndrome.  He needs a surveillance colonoscopy now-will need deep sedation with propofol by Dr. Marcos Eke

## 2012-08-08 NOTE — Progress Notes (Signed)
Do we need to just triage Craig Scott or does he actually need an office visit first, Please advise?

## 2012-08-10 ENCOUNTER — Encounter: Payer: Self-pay | Admitting: Internal Medicine

## 2012-08-10 NOTE — Progress Notes (Signed)
Pt actually agreed to an office visit prior to scheduling his procedure.  He is scheduled to see an extender first.

## 2012-08-10 NOTE — Progress Notes (Signed)
Pt has OV with Gerrit Halls, NP on 08/15/2012 @3 :30 PM.

## 2012-08-10 NOTE — Progress Notes (Signed)
Craig Scott 08/08/2012 8:48 AM Signed  Do we need to just triage [name] or does he actually need an office visit first, Please advise?

## 2012-08-10 NOTE — Progress Notes (Signed)
Forwarding to [facility] for triage

## 2012-08-15 ENCOUNTER — Ambulatory Visit: Payer: Self-pay | Admitting: Gastroenterology

## 2012-08-15 ENCOUNTER — Telehealth: Payer: Self-pay | Admitting: Gastroenterology

## 2012-08-15 NOTE — Telephone Encounter (Signed)
Pt was a no show

## 2012-08-25 ENCOUNTER — Ambulatory Visit (INDEPENDENT_AMBULATORY_CARE_PROVIDER_SITE_OTHER): Payer: BC Managed Care – PPO | Admitting: Gastroenterology

## 2012-08-25 ENCOUNTER — Encounter: Payer: Self-pay | Admitting: Gastroenterology

## 2012-08-25 VITALS — BP 139/87 | HR 82 | Temp 98.5°F | Ht 70.0 in | Wt 245.2 lb

## 2012-08-25 DIAGNOSIS — R160 Hepatomegaly, not elsewhere classified: Secondary | ICD-10-CM

## 2012-08-25 DIAGNOSIS — K219 Gastro-esophageal reflux disease without esophagitis: Secondary | ICD-10-CM

## 2012-08-25 DIAGNOSIS — Z1509 Genetic susceptibility to other malignant neoplasm: Secondary | ICD-10-CM

## 2012-08-25 DIAGNOSIS — Z800 Family history of malignant neoplasm of digestive organs: Secondary | ICD-10-CM

## 2012-08-25 MED ORDER — PEG 3350-KCL-NA BICARB-NACL 420 G PO SOLR: 4000.0000 mL | ORAL | Status: DC

## 2012-08-25 NOTE — Patient Instructions (Addendum)
We have set you up for a colonoscopy with Dr. Jena Gauss in the near future.  I have also ordered an ultrasound of your belly.   Start taking Zegerid each morning. We have provided samples. If this does well for you, please call us and we will send in a complete prescription.  Have a wonderful Christmas!

## 2012-08-25 NOTE — Progress Notes (Signed)
Referring Provider: McGough, William, MD Primary Care Physician:  MCGOUGH,WILLIAM M, MD Primary Gastroenterologist:  Dr. Rourk   Chief Complaint  Patient presents with  . Colonoscopy    HPI:   56-year-old pleasant gentleman, presenting today with need for surveillance TCS. Personal hx of colonic adenomas, marked positive family hx of colon polyps and colon cancer. Recently diagnosed with Lynch syndrome.  Flips flop between diarrhea and constipation. Used to be regular in remote past. Now alternating bowel habits X 2-3 years. Had knee replacement Jan 17th 2011. Hasn't been right since. Not following a high fiber diet. Takes vitamins, fish oil. No rectal bleeding that he knows of. +reflux, takes Prilosec. If doesn't take it, has a brick substernal. No dysphagia. Drinks a significantly large amount of beer daily. States he runs his dogs when he gets home, and he drinks as long as it takes to do this. Quite a risk-taker, enjoys riding the 4-wheeler and his [employer] Jeep.   He is emphatic that he does NOT want Propofol. He wasn't thrilled about the visit today, either, noting we just wanted his co-pay. He was still pleasant and joking, but he wanted to make it known that he thought this was unnecessary.   Past Medical History  Diagnosis Date  . High cholesterol   . Lynch syndrome   . GERD (gastroesophageal reflux disease)   . Hearing aid worn     Past Surgical History  Procedure Date  . Knee surgery     X2  . Colonoscopy  12/09/01    RMR: Normal normal rectum/ A few scattered left-sided diverticula.  The remainder of the colonic  mucosa appeared normal  . Colonoscopy 12/15/2002    RMR: Normal rectum, scattered left-sided diverticula.  The remainder of the colonic mucosa appeared normal  . Colonoscopy 04/10/2004    RMR: Normal rectum/Sigmoid diverticula/The remainder of the colonic mucosa appeared normal  . Esophagogastroduodenoscopy 12/23/2005    RMR:   Esophagogastric peptic stricture with  reflux esophagitis, status post  dilation as described above/ Small hiatal hernia, otherwise normal stomach.  Bulbar erosions otherwise normal D1 and D2  . Colonoscopy 12/23/2005    RMR: Normal rectum, scattered sigmoid diverticula Colonic mucosa appeared normal  . Colonoscopy   01/20/2008    RMR: Distal diminutive rectal polyps, status post cold biopsy removal  otherwise, normal rectum/ biopsy removal; scattered sigmoid diverticula; and benign-appearing/ulcers about the ileocecal valve, status post biopsy.  Remainder of colonic mucosa and terminal mucosa appeared normal.  . Colonoscopy 05/22/2010    RMR: distal diminutive rectal polyp s/p bx otherwise normal/few scattered pancolonic diverticula  . Eye surgery   . Nose surgery     Current Outpatient Prescriptions  Medication Sig Dispense Refill  . ALPRAZolam (XANAX) 1 MG tablet Take 1 mg by mouth every 6 (six) hours as needed. anxiety      . aspirin 325 MG tablet Take 162.5 mg by mouth daily. Takes 1/2 tablet (162.5 mg) 30 minutes before taking Niaspan daily      . glucosamine-chondroitin 500-400 MG tablet Take 1 tablet by mouth daily.      . HYDROcodone-acetaminophen (LORCET) 10-650 MG per tablet Take 1 tablet by mouth every 6 (six) hours as needed. From knee pain surgery      . ibuprofen (ADVIL) 200 MG tablet Take 200 mg by mouth every 6 (six) hours as needed. fever      . Multiple Vitamin (MULTIVITAMIN WITH MINERALS) TABS Take 1 tablet by mouth daily.      .   niacin (NIASPAN) 500 MG CR tablet Take 500 mg by mouth every morning.      . omega-3 acid ethyl esters (LOVAZA) 1 G capsule Take 1 g by mouth daily.      . omeprazole (PRILOSEC) 20 MG capsule Take 20 mg by mouth daily.      . simvastatin (ZOCOR) 20 MG tablet Take 20 mg by mouth daily.      . polyethylene glycol-electrolytes (TRILYTE) 420 G solution Take 4,000 mLs by mouth as directed.  4000 mL  0    Allergies as of 08/25/2012  . (No Known Allergies)    Family History  Problem  Relation Age of Onset  . Colon cancer Brother     49   . Colon cancer Brother     59  . Colon cancer Cousin     27 years old onset  . Colon cancer Cousin     49 years onset  . Colon cancer Mother     age 46     History   Social History  . Marital Status: Married    Spouse Name: N/A    Number of Children: N/A  . Years of Education: N/A   Occupational History  . [employer]     [place]   Social History Main Topics  . Smoking status: Former Smoker  . Smokeless tobacco: Not on file     Comment: quit 4-5 years ago  . Alcohol Use: Yes     Comment: daily, multiple beers in the evening   . Drug Use: No  . Sexually Active: Not on file   Other Topics Concern  . Not on file   Social History Narrative  . No narrative on file    Review of Systems: Gen: Denies any fever, chills, loss of appetite, fatigue, weight loss. CV: Denies chest pain, heart palpitations, syncope, peripheral edema. Resp: Denies shortness of breath with rest, cough, wheezing GI: Denies dysphagia or odynophagia. Denies hematemesis, fecal incontinence, or jaundice.  GU : Denies urinary burning, urinary frequency, urinary incontinence.  MS: constant joint pain, everywhere  Derm: Denies rash, itching, dry skin Psych: +anxiety Heme: Denies bruising, bleeding, and enlarged lymph nodes.  Physical Exam: BP 139/87  Pulse 82  Temp 98.5 F (36.9 C) (Oral)  Ht 5' 10" (1.778 m)  Wt 245 lb 3.2 oz (111.222 kg)  BMI 35.18 kg/m2 General:   Alert and oriented. Well-developed, well-nourished, pleasant and cooperative. Head:  Normocephalic and atraumatic. Eyes:  Conjunctiva pink, sclera clear, no icterus.   Conjunctiva pink. Ears:  Normal auditory acuity. Nose:  No deformity, discharge,  or lesions. Mouth:  No deformity or lesions, mucosa pink and moist.  Neck:  Supple, without mass or thyromegaly. Lungs:  Clear to auscultation bilaterally, without wheezing, rales, or rhonchi.  Heart:  S1, S2 present without  murmurs noted.  Abdomen:  +BS, soft, non-tender and non-distended. Without mass. Questionable hepatomegaly, with liver margin 2-3 FBs below right costal margin. Rectal:  Deferred  Msk:  Symmetrical without gross deformities. Normal posture. Pulses:  Normal pulses noted. Extremities:  Without clubbing or edema. Neurologic:  Alert and  oriented x4;  grossly normal neurologically. Skin:  Intact, warm and dry without significant lesions or rashes Cervical Nodes:  No significant cervical adenopathy. Psych:  Alert and cooperative. Normal mood and affect.   

## 2012-09-01 ENCOUNTER — Encounter: Payer: Self-pay | Admitting: Gastroenterology

## 2012-09-01 DIAGNOSIS — Z1509 Genetic susceptibility to other malignant neoplasm: Secondary | ICD-10-CM | POA: Insufficient documentation

## 2012-09-01 DIAGNOSIS — R160 Hepatomegaly, not elsewhere classified: Secondary | ICD-10-CM | POA: Insufficient documentation

## 2012-09-01 DIAGNOSIS — K219 Gastro-esophageal reflux disease without esophagitis: Secondary | ICD-10-CM | POA: Insufficient documentation

## 2012-09-01 NOTE — Assessment & Plan Note (Signed)
On Prilosec but does not seem to be adequately controlled. No warning signs. Trial of Zegerid.

## 2012-09-01 NOTE — Assessment & Plan Note (Signed)
Noted on physical exam, possible. Due to his significant heavy intake of ETOH, I'm concerned for underlying liver disease. [place] of abdomen in near future.

## 2012-09-01 NOTE — Progress Notes (Signed)
Faxed to PCP

## 2012-09-01 NOTE — Assessment & Plan Note (Signed)
Significant family hx of colon cancer as noted in [facility]. Recently confirmed Lynch syndrome. Last TCS in 2011; due for surveillance. Pt is adamant that he not be done with Propofol. I think it is possible we can do this with standard sedation and Phenergan. Pt agreeable to this.   Proceed with TCS with Dr. Jena Gauss in near future: the risks, benefits, and alternatives have been discussed with the patient in detail. The patient states understanding and desires to proceed. Phenergan 25 mg on call

## 2012-09-05 ENCOUNTER — Encounter (HOSPITAL_COMMUNITY): Payer: Self-pay | Admitting: Pharmacy Technician

## 2012-09-16 ENCOUNTER — Ambulatory Visit (HOSPITAL_COMMUNITY)
Admission: RE | Admit: 2012-09-16 | Discharge: 2012-09-16 | Disposition: A | Discharge status: Home / Self Care | Payer: BC Managed Care – PPO | Source: Ambulatory Visit | Attending: Internal Medicine | Admitting: Internal Medicine

## 2012-09-16 ENCOUNTER — Encounter (HOSPITAL_COMMUNITY)
Admission: RE | Disposition: A | Discharge status: Home / Self Care | Payer: Self-pay | Source: Ambulatory Visit | Attending: Internal Medicine

## 2012-09-16 ENCOUNTER — Encounter (HOSPITAL_COMMUNITY): Payer: Self-pay | Admitting: *Deleted

## 2012-09-16 DIAGNOSIS — K5730 Diverticulosis of large intestine without perforation or abscess without bleeding: Secondary | ICD-10-CM | POA: Insufficient documentation

## 2012-09-16 DIAGNOSIS — D126 Benign neoplasm of colon, unspecified: Secondary | ICD-10-CM | POA: Insufficient documentation

## 2012-09-16 DIAGNOSIS — Z800 Family history of malignant neoplasm of digestive organs: Secondary | ICD-10-CM | POA: Insufficient documentation

## 2012-09-16 DIAGNOSIS — R160 Hepatomegaly, not elsewhere classified: Secondary | ICD-10-CM

## 2012-09-16 DIAGNOSIS — Z1211 Encounter for screening for malignant neoplasm of colon: Secondary | ICD-10-CM

## 2012-09-16 DIAGNOSIS — Z86010 Personal history of colonic polyps: Secondary | ICD-10-CM

## 2012-09-16 HISTORY — PX: COLONOSCOPY: SHX5424

## 2012-09-16 SURGERY — COLONOSCOPY
Anesthesia: Moderate Sedation

## 2012-09-16 MED ORDER — SODIUM CHLORIDE 0.9 % IJ SOLN
INTRAMUSCULAR | Status: AC
  Filled 2012-09-16: qty 10

## 2012-09-16 MED ORDER — MIDAZOLAM HCL 5 MG/ML IJ SOLN: 2.0000 mg | Freq: Once | INTRAMUSCULAR | Status: DC

## 2012-09-16 MED ORDER — SODIUM CHLORIDE 0.45 % IV SOLN
INTRAVENOUS | Status: DC
  Administered 2012-09-16: 07:00:00 via INTRAVENOUS

## 2012-09-16 MED ORDER — MIDAZOLAM HCL 5 MG/5ML IJ SOLN
INTRAMUSCULAR | Status: AC
  Filled 2012-09-16: qty 10

## 2012-09-16 MED ORDER — PROMETHAZINE HCL 25 MG/ML IJ SOLN
INTRAMUSCULAR | Status: AC
  Filled 2012-09-16: qty 1

## 2012-09-16 MED ORDER — MEPERIDINE HCL 100 MG/ML IJ SOLN
INTRAMUSCULAR | Status: DC | PRN
  Administered 2012-09-16 (×4): 50 mg via INTRAVENOUS

## 2012-09-16 MED ORDER — MEPERIDINE HCL 100 MG/ML IJ SOLN
INTRAMUSCULAR | Status: AC
  Filled 2012-09-16: qty 2

## 2012-09-16 MED ORDER — MIDAZOLAM HCL 5 MG/5ML IJ SOLN
INTRAMUSCULAR | Status: DC | PRN
  Administered 2012-09-16 (×4): 2 mg via INTRAVENOUS

## 2012-09-16 MED ORDER — MIDAZOLAM HCL 5 MG/ML IJ SOLN
2.0000 mg | Freq: Once | INTRAMUSCULAR | Status: AC
  Administered 2012-09-16: 2 mg via INTRAVENOUS

## 2012-09-16 MED ORDER — STERILE WATER FOR IRRIGATION IR SOLN
Status: DC | PRN
  Administered 2012-09-16: 08:00:00

## 2012-09-16 MED ORDER — MIDAZOLAM HCL 2 MG/2ML IJ SOLN
INTRAMUSCULAR | Status: AC
  Filled 2012-09-16: qty 2

## 2012-09-16 MED ORDER — PROMETHAZINE HCL 25 MG/ML IJ SOLN
25.0000 mg | Freq: Once | INTRAMUSCULAR | Status: AC
  Administered 2012-09-16: 25 mg via INTRAVENOUS

## 2012-09-16 NOTE — Op Note (Signed)
[facility] [address] [place], [address]   COLONOSCOPY PROCEDURE REPORT  PATIENT: Craig, Scott  MR#:         [id number] BIRTHDATE: October 01, 1955 , 56  yrs. old GENDER: Male ENDOSCOPIST: R.  Roetta Sessions, MD FACP FACG REFERRED BY:  Karleen Hampshire, M.D. PROCEDURE DATE:  09/16/2012 PROCEDURE:    Colonoscopy with biopsy  INDICATIONS:    High-risk screening colonoscopy; patient has Lynch syndrome  INFORMED CONSENT:  The risks, benefits, alternatives and imponderables including but not limited to bleeding, perforation as well as the possibility of a missed lesion have been reviewed.  The potential for biopsy, lesion removal, etc. have also been discussed.  Questions have been answered.  All parties agreeable. Please see the history and physical in the medical record for more information.  MEDICATIONS: Versed 8 mg IV and Demerol 200 mg IV in divided doses. Phenergan 25 mg IV  DESCRIPTION OF PROCEDURE:  After a digital rectal exam was performed, the Pentax Colonoscope [phone]  colonoscope was advanced from the anus through the rectum and colon to the area of the cecum, ileocecal valve and appendiceal orifice.  The cecum was deeply intubated.  These structures were well-seen and photographed for the record.  From the level of the cecum and ileocecal valve, the scope was slowly and cautiously withdrawn.  The mucosal surfaces were carefully surveyed utilizing scope tip deflection to facilitate fold flattening as needed.  The scope was pulled down into the rectum where a thorough examination including retroflexion was performed.    FINDINGS:  Adequate preparation. Normal rectum. Few scattered pancolonic diverticulosis. (1) diminutive polyp in the base the cecum.  (1) 3 x 3 mm area of hyperpigmentation in the mid descending segment. This was a flat benign-appearing area. Otherwise, the remainder of the colonic mucosa appeared normal.  THERAPEUTIC /  DIAGNOSTIC MANEUVERS PERFORMED:  The cecal polyp was cold biopsied/removed. The area of hyperpigmentation in the left colon was cold biopsied  COMPLICATIONS: None  CECAL WITHDRAWAL TIME:  15 minutes  IMPRESSION:  Colonic diverticulosis. Colonic polyp-removed described above. Status post colonic biopsy  RECOMMENDATIONS: Followup on pathology. Further recommendations to follow.   _______________________________ eSigned:  R. Roetta Sessions, MD FACP [facility] 09/16/2012 8:37 AM   CC:    PATIENT NAME:  Craig, Scott MR#: [id number]

## 2012-09-16 NOTE — H&P (View-Only) (Signed)
Referring Provider: Karleen Hampshire, MD Primary Care Physician:  Kirk Ruths, MD Primary Gastroenterologist:  Dr. Jena Gauss   Chief Complaint  Patient presents with  . Colonoscopy    HPI:   57 year old pleasant gentleman, presenting today with need for surveillance TCS. Personal hx of colonic adenomas, marked positive family hx of colon polyps and colon cancer. Recently diagnosed with Lynch syndrome.  Flips flop between diarrhea and constipation. Used to be regular in remote past. Now alternating bowel habits X 2-3 years. Had knee replacement Jan 17th 2011. Hasn't been right since. Not following a high fiber diet. Takes vitamins, fish oil. No rectal bleeding that he knows of. +reflux, takes Prilosec. If doesn't take it, has a brick substernal. No dysphagia. Drinks a significantly large amount of beer daily. States he runs his dogs when he gets home, and he drinks as long as it takes to do this. Quite a Clinical cytogeneticist, enjoys riding the 4-wheeler and his [employer].   He is emphatic that he does NOT want Propofol. He wasn't thrilled about the visit today, either, noting we just wanted his co-pay. He was still pleasant and joking, but he wanted to make it known that he thought this was unnecessary.   Past Medical History  Diagnosis Date  . High cholesterol   . Lynch syndrome   . GERD (gastroesophageal reflux disease)   . Hearing aid worn     Past Surgical History  Procedure Date  . Knee surgery     X2  . Colonoscopy  12/09/01    RMR: Normal normal rectum/ A few scattered left-sided diverticula.  The remainder of the colonic  mucosa appeared normal  . Colonoscopy 12/15/2002    RMR: Normal rectum, scattered left-sided diverticula.  The remainder of the colonic mucosa appeared normal  . Colonoscopy 04/10/2004    RMR: Normal rectum/Sigmoid diverticula/The remainder of the colonic mucosa appeared normal  . Esophagogastroduodenoscopy 12/23/2005    RMR:   Esophagogastric peptic stricture with  reflux esophagitis, status post  dilation as described above/ Small hiatal hernia, otherwise normal stomach.  Bulbar erosions otherwise normal D1 and D2  . Colonoscopy 12/23/2005    RMR: Normal rectum, scattered sigmoid diverticula Colonic mucosa appeared normal  . Colonoscopy   01/20/2008    RMR: Distal diminutive rectal polyps, status post cold biopsy removal  otherwise, normal rectum/ biopsy removal; scattered sigmoid diverticula; and benign-appearing/ulcers about the ileocecal valve, status post biopsy.  Remainder of colonic mucosa and terminal mucosa appeared normal.  . Colonoscopy 05/22/2010    RMR: distal diminutive rectal polyp s/p bx otherwise normal/few scattered pancolonic diverticula  . Eye surgery   . Nose surgery     Current Outpatient Prescriptions  Medication Sig Dispense Refill  . ALPRAZolam (XANAX) 1 MG tablet Take 1 mg by mouth every 6 (six) hours as needed. anxiety      . aspirin 325 MG tablet Take 162.5 mg by mouth daily. Takes 1/2 tablet (162.5 mg) 30 minutes before taking Niaspan daily      . glucosamine-chondroitin 500-400 MG tablet Take 1 tablet by mouth daily.      Marland Kitchen HYDROcodone-acetaminophen (LORCET) 10-650 MG per tablet Take 1 tablet by mouth every 6 (six) hours as needed. From knee pain surgery      . ibuprofen (ADVIL) 200 MG tablet Take 200 mg by mouth every 6 (six) hours as needed. fever      . Multiple Vitamin (MULTIVITAMIN WITH MINERALS) TABS Take 1 tablet by mouth daily.      Marland Kitchen  niacin (NIASPAN) 500 MG CR tablet Take 500 mg by mouth every morning.      Marland Kitchen omega-3 acid ethyl esters (LOVAZA) 1 G capsule Take 1 g by mouth daily.      Marland Kitchen omeprazole (PRILOSEC) 20 MG capsule Take 20 mg by mouth daily.      . simvastatin (ZOCOR) 20 MG tablet Take 20 mg by mouth daily.      . polyethylene glycol-electrolytes (TRILYTE) 420 G solution Take 4,000 mLs by mouth as directed.  4000 mL  0    Allergies as of 08/25/2012  . (No Known Allergies)    Family History  Problem  Relation Age of Onset  . Colon cancer Brother     76   . Colon cancer Brother     31  . Colon cancer Cousin     39 years old onset  . Colon cancer Cousin     49 years onset  . Colon cancer Mother     age 34     History   Social History  . Marital Status: Married    Spouse Name: N/A    Number of Children: N/A  . Years of Education: N/A   Occupational History  . Anthony Inspector     [place]   Social History Main Topics  . Smoking status: Former Games developer  . Smokeless tobacco: Not on file     Comment: quit 4-5 years ago  . Alcohol Use: Yes     Comment: daily, multiple beers in the evening   . Drug Use: No  . Sexually Active: Not on file   Other Topics Concern  . Not on file   Social History Narrative  . No narrative on file    Review of Systems: Gen: Denies any fever, chills, loss of appetite, fatigue, weight loss. CV: Denies chest pain, heart palpitations, syncope, peripheral edema. Resp: Denies shortness of breath with rest, cough, wheezing GI: Denies dysphagia or odynophagia. Denies hematemesis, fecal incontinence, or jaundice.  GU : Denies urinary burning, urinary frequency, urinary incontinence.  MS: constant joint pain, everywhere  Derm: Denies rash, itching, dry skin Psych: +anxiety Heme: Denies bruising, bleeding, and enlarged lymph nodes.  Physical Exam: BP 139/87  Pulse 82  Temp 98.5 F (36.9 C) (Oral)  Ht 5\' 10"  (1.778 m)  Wt 245 lb 3.2 oz (111.222 kg)  BMI 35.18 kg/m2 General:   Alert and oriented. Well-developed, well-nourished, pleasant and cooperative. Head:  Normocephalic and atraumatic. Eyes:  Conjunctiva pink, sclera clear, no icterus.   Conjunctiva pink. Ears:  Normal auditory acuity. Nose:  No deformity, discharge,  or lesions. Mouth:  No deformity or lesions, mucosa pink and moist.  Neck:  Supple, without mass or thyromegaly. Lungs:  Clear to auscultation bilaterally, without wheezing, rales, or rhonchi.  Heart:  S1, S2 present without  murmurs noted.  Abdomen:  +BS, soft, non-tender and non-distended. Without mass. Questionable hepatomegaly, with liver margin 2-3 FBs below right costal margin. Rectal:  Deferred  Msk:  Symmetrical without gross deformities. Normal posture. Pulses:  Normal pulses noted. Extremities:  Without clubbing or edema. Neurologic:  Alert and  oriented x4;  grossly normal neurologically. Skin:  Intact, warm and dry without significant lesions or rashes Cervical Nodes:  No significant cervical adenopathy. Psych:  Alert and cooperative. Normal mood and affect.

## 2012-09-16 NOTE — Interval H&P Note (Signed)
History and Physical Interval Note:  09/16/2012 7:35 AM  Craig Scott  has presented today for surgery, with the diagnosis of HEPATOMEGALY  The various methods of treatment have been discussed with the patient and family. After consideration of risks, benefits and other options for treatment, the patient has consented to  Procedure(s) (LRB) with comments: COLONOSCOPY (N/A) - 7:30 as a surgical intervention .  The patient's history has been reviewed, patient examined, no change in status, stable for surgery.  I have reviewed the patient's chart and labs.  Questions were answered to the patient's satisfaction.     Eula Listen  As above. Colonoscopy per plan.The risks, benefits, limitations, alternatives and imponderables have been reviewed with the patient. Questions have been answered. All parties are agreeable.

## 2012-09-19 ENCOUNTER — Encounter (HOSPITAL_COMMUNITY): Payer: Self-pay | Admitting: Internal Medicine

## 2012-09-20 ENCOUNTER — Encounter: Payer: Self-pay | Admitting: Internal Medicine

## 2012-09-20 ENCOUNTER — Encounter: Payer: Self-pay | Admitting: *Deleted

## 2013-01-05 ENCOUNTER — Other Ambulatory Visit (HOSPITAL_COMMUNITY): Payer: Self-pay | Admitting: Cardiovascular Disease

## 2013-01-05 DIAGNOSIS — R079 Chest pain, unspecified: Secondary | ICD-10-CM

## 2013-01-12 ENCOUNTER — Ambulatory Visit (HOSPITAL_COMMUNITY)
Admission: RE | Admit: 2013-01-12 | Discharge: 2013-01-12 | Disposition: A | Discharge status: Home / Self Care | Payer: BC Managed Care – PPO | Source: Ambulatory Visit | Attending: Cardiovascular Disease | Admitting: Cardiovascular Disease

## 2013-01-12 DIAGNOSIS — Z8249 Family history of ischemic heart disease and other diseases of the circulatory system: Secondary | ICD-10-CM | POA: Insufficient documentation

## 2013-01-12 DIAGNOSIS — E669 Obesity, unspecified: Secondary | ICD-10-CM | POA: Insufficient documentation

## 2013-01-12 DIAGNOSIS — R079 Chest pain, unspecified: Secondary | ICD-10-CM | POA: Insufficient documentation

## 2013-01-12 DIAGNOSIS — R002 Palpitations: Secondary | ICD-10-CM | POA: Insufficient documentation

## 2013-01-12 DIAGNOSIS — Z87891 Personal history of nicotine dependence: Secondary | ICD-10-CM | POA: Insufficient documentation

## 2013-01-12 MED ORDER — TECHNETIUM TC 99M SESTAMIBI GENERIC - CARDIOLITE
10.2000 | Freq: Once | INTRAVENOUS | Status: AC | PRN
  Administered 2013-01-12: 10 via INTRAVENOUS

## 2013-01-12 MED ORDER — TECHNETIUM TC 99M SESTAMIBI GENERIC - CARDIOLITE
29.3000 | Freq: Once | INTRAVENOUS | Status: AC | PRN
  Administered 2013-01-12: 29 via INTRAVENOUS

## 2013-01-12 NOTE — Procedures (Addendum)
[facility] [address] [place] [address] [place] [address] [phone]  Cardiology Nuclear Med Study  Craig Scott is a 57 y.o. male     MRN : [id number]     DOB: 1955/12/15  Procedure Date: 01/12/2013  Nuclear Med Background Indication for Stress Test:  Evaluation for Ischemia History:  NO PRIOR CARDIAC OR RESPIRATORY PROBLEMS REPORTED. Cardiac Risk Factors: Family History - CAD, History of Smoking, Lipids, Obesity and DAILY ETOH USE--HEAVY  Symptoms:  Chest Pain and Palpitations   Nuclear Pre-Procedure Caffeine/Decaff Intake:  1:00am NPO After: 11AM   IV Site: R Hand  IV 0.9% NS with Angio Cath:  22g  Chest Size (in):  48"   IV Started by: Emmit Pomfret, RN  Height: 5\' 11"  (1.803 m)  Cup Size: n/a  BMI:  Body mass index is 33.77 kg/(m^2). Weight:  242 lb (109.77 kg)   Tech Comments:  N/A    Nuclear Med Study 1 or 2 day study: 1 day  Stress Test Type:  Stress  Order Authorizing Provider:  Thurmon Fair, MD   Resting Radionuclide: Technetium 83m Sestamibi  Resting Radionuclide Dose: 10.2 mCi   Stress Radionuclide:  Technetium 65m Sestamibi  Stress Radionuclide Dose: 29.3 mCi           Stress Protocol Rest HR: 81 Stress HR: 144  Rest BP: 143/86 Stress BP: 170/87  Exercise Time (min): 6:30 METS: 7.0   Predicted Max HR: 164 bpm % Max HR: 87.8 bpm Rate Pressure Product: 21308  Dose of Adenosine (mg):  n/a Dose of Lexiscan: n/a mg  Dose of Atropine (mg): n/a Dose of Dobutamine: n/a mcg/kg/min (at max HR)  Stress Test Technologist: Esperanza Sheets, CCT Nuclear Technologist: Gonzella Lex, CNMT   Rest Procedure:  Myocardial perfusion imaging was performed at rest 45 minutes following the intravenous administration of Technetium 50m Sestamibi. Stress Procedure:  The patient performed treadmill exercise using a Bruce  Protocol for 6:30 minutes. The patient stopped due to Neuropathy and SOB and denied any chest pain.  There  were no significant ST-T wave changes.  Technetium 35m Sestamibi was injected at peak exercise and myocardial perfusion imaging was performed after a brief delay.  Transient Ischemic Dilatation (Normal <1.22):  0.97 Lung/Heart Ratio (Normal <0.45):  0.34 QGS EDV:  114 ml QGS ESV:  44 ml LV Ejection Fraction: 61%  Rest ECG: NSR - Normal EKG  Stress ECG: No significant ST segment change suggestive of ischemia.  QPS Raw Data Images:  Normal; no motion artifact; normal heart/lung ratio. Stress Images:  There is decreased uptake in the septum. Rest Images:  There is decreased uptake in the septum. Subtraction (SDS):  No evidence of ischemia.  Impression Exercise Capacity:  Fair exercise capacity. BP Response:  Hypertensive blood pressure response. Clinical Symptoms:  Mild chest pain/dyspnea. ECG Impression:  No significant ST segment change suggestive of ischemia. Comparison with Prior Nuclear Study: No previous nuclear study performed  Overall Impression:  Low risk stress nuclear study . There is a fixed basal to mid septal defect, but no associated wall motion abnormality, suggsetive of artifact, more than scar. No reversible ischemia is noted.  Bruce treadmill was negative for ischemia. No septal Q waves were noted. Exercise tolerance was fair. No significant RV tracer uptake was noted.  LV Wall Motion:  NL LV Function; NL Wall Motion; EF 61%.  [name], MD, [facility] Board Certified in Nuclear Cardiology Attending Cardiologist [facility]   [name], MD  01/12/2013 6:08 PM

## 2013-10-09 NOTE — Progress Notes (Signed)
Dr. Wynelle Link could you please put orders in [facility] as patient has pre-op appointment on 10/16/13 at 1030 am! Thank you!

## 2013-10-10 ENCOUNTER — Other Ambulatory Visit: Payer: Self-pay | Admitting: Orthopedic Surgery

## 2013-10-13 ENCOUNTER — Other Ambulatory Visit (HOSPITAL_COMMUNITY): Payer: Self-pay | Admitting: Orthopedic Surgery

## 2013-10-13 NOTE — Patient Instructions (Addendum)
20 RENDELL THIVIERGE  10/13/2013   Your procedure is scheduled on: Monday February 9th  Report to [facility] at 625  AM.  Call this number if you have problems the morning of surgery [phone]   Remember: NO VISITORS UNDER AGE 57 PER [place].   Do not eat food or drink liquids :After Midnight.     Take these medicines the morning of surgery with A SIP OF WATER: xanax if needed, certizine, prilosec, simvastatin, hydrocodne if needed                                SEE Padroni may not have any metal on your body including hair pins and piercings  Do not wear jewelry, make-up.  Do not wear lotions, powders, or perfumes. No  Deodorant is to be worn.   Men may shave face and neck.  Do not bring valuables to the hospital. [place].  Contacts, dentures or bridgework may not be worn into surgery.  Leave suitcase in the car. After surgery it may be brought to your room.  For patients admitted to the hospital, checkout time is 11:00 AM the day of discharge.   Patients discharged the day of surgery will not be allowed to drive home.  Name and phone number of your driver:  Special Instructions: N/A  Please read over the following fact sheets that you were given: [facility] Preparing for surgery sheet, MRSA information, blood fact sheet, incentive spirometer sheet  Call Zelphia Cairo RN pre op nurse if needed [phone]    [place].  PATIENT SIGNATURE___________________________________________  NURSE SIGNATURE_____________________________________________

## 2013-10-16 ENCOUNTER — Encounter (HOSPITAL_COMMUNITY): Payer: Self-pay | Admitting: Pharmacy Technician

## 2013-10-16 ENCOUNTER — Encounter (HOSPITAL_COMMUNITY)
Admission: RE | Admit: 2013-10-16 | Discharge: 2013-10-16 | Disposition: A | Discharge status: Home / Self Care | Payer: Worker's Compensation | Source: Ambulatory Visit | Attending: Orthopedic Surgery | Admitting: Orthopedic Surgery

## 2013-10-16 ENCOUNTER — Encounter (INDEPENDENT_AMBULATORY_CARE_PROVIDER_SITE_OTHER): Payer: Self-pay

## 2013-10-16 ENCOUNTER — Encounter (HOSPITAL_COMMUNITY): Payer: Self-pay

## 2013-10-16 DIAGNOSIS — Z01812 Encounter for preprocedural laboratory examination: Secondary | ICD-10-CM | POA: Insufficient documentation

## 2013-10-16 HISTORY — DX: Unspecified osteoarthritis, unspecified site: M19.90

## 2013-10-16 HISTORY — DX: Polyneuropathy, unspecified: G62.9

## 2013-10-16 HISTORY — DX: Anxiety disorder, unspecified: F41.9

## 2013-10-16 LAB — URINALYSIS, ROUTINE W REFLEX MICROSCOPIC
Bilirubin Urine: NEGATIVE
GLUCOSE, UA: NEGATIVE mg/dL
Hgb urine dipstick: NEGATIVE
KETONES UR: NEGATIVE mg/dL
LEUKOCYTES UA: NEGATIVE
NITRITE: NEGATIVE
PH: 6.5 (ref 5.0–8.0)
Protein, ur: NEGATIVE mg/dL
SPECIFIC GRAVITY, URINE: 1.009 (ref 1.005–1.030)
Urobilinogen, UA: 0.2 mg/dL (ref 0.0–1.0)

## 2013-10-16 LAB — COMPREHENSIVE METABOLIC PANEL
ALBUMIN: 4 g/dL (ref 3.5–5.2)
ALT: 40 U/L (ref 0–53)
AST: 30 U/L (ref 0–37)
Alkaline Phosphatase: 85 U/L (ref 39–117)
BILIRUBIN TOTAL: 0.3 mg/dL (ref 0.3–1.2)
BUN: 17 mg/dL (ref 6–23)
CHLORIDE: 100 meq/L (ref 96–112)
CO2: 25 mEq/L (ref 19–32)
Calcium: 8.8 mg/dL (ref 8.4–10.5)
Creatinine, Ser: 0.83 mg/dL (ref 0.50–1.35)
GFR calc Af Amer: >90 mL/min (ref >90)
Glucose, Bld: 105 mg/dL — ABNORMAL HIGH (ref 70–99)
POTASSIUM: 4 meq/L (ref 3.7–5.3)
SODIUM: 140 meq/L (ref 137–147)
Total Protein: 7.3 g/dL (ref 6.0–8.3)

## 2013-10-16 LAB — CBC
HCT: 41.1 % (ref 39.0–52.0)
Hemoglobin: 14.3 g/dL (ref 13.0–17.0)
MCH: 30.8 pg (ref 26.0–34.0)
MCHC: 34.8 g/dL (ref 30.0–36.0)
MCV: 88.6 fL (ref 78.0–100.0)
PLATELETS: 254 10*3/uL (ref 150–400)
RBC: 4.64 MIL/uL (ref 4.22–5.81)
RDW: 12.5 % (ref 11.5–15.5)
WBC: 6.5 10*3/uL (ref 4.0–10.5)

## 2013-10-16 LAB — PROTIME-INR
INR: 0.98 (ref 0.00–1.49)
Prothrombin Time: 12.8 seconds (ref 11.6–15.2)

## 2013-10-16 LAB — SURGICAL PCR SCREEN
MRSA, PCR: NEGATIVE
STAPHYLOCOCCUS AUREUS: NEGATIVE

## 2013-10-16 LAB — APTT: APTT: 30 s (ref 24–37)

## 2013-10-16 NOTE — Progress Notes (Addendum)
Resting ekg 01-12-2013 on chart Dr Orson Ape medical clearance note on chart Stress test [id number] epic

## 2013-10-22 ENCOUNTER — Other Ambulatory Visit: Payer: Self-pay | Admitting: Orthopedic Surgery

## 2013-10-22 NOTE — H&P (Signed)
Craig Scott  DOB: 24-Aug-1956 Married / Language: English / Race: White Male  Date of Admission:  10-23-2013  Chief Complaint:  Left Knee Pain  History of Present Illness The patient is a 58 year old male who comes in for a preoperative History and Physical. The patient is scheduled for a left total knee arthroplasty to be performed by Dr. Dione Plover. Aluisio, MD at [facility] on 10-23-2013. The patient is a 58 year old male who comes in today 1 year out from right total knee arthroplasty. The patient states that he is doing fair at this time. The pain is under fair control at this time and describe their pain as mild to moderate. Note for "Post TKA": He said he had an incident where a chain saw "kicked back" on him about a week or so ago and hit his knee. He is now having a lot of problems with the left knee also, but we will have to see if we can get work comp approval to see him for that knee. He said he hurts a lot at work. He is not getting swelling. He is not having lower extremity weakness or paresthesia with this. He is now ready to proceed with the left knee at this time. They have been treated conservatively in the past for the above stated problem and despite conservative measures, they continue to have progressive pain and severe functional limitations and dysfunction. They have failed non-operative management including home exercise, medications, and injections. It is felt that they would benefit from undergoing total joint replacement. Risks and benefits of the procedure have been discussed with the patient and they elect to proceed with surgery. There are no active contraindications to surgery such as ongoing infection or rapidly progressive neurological disease.   Allergies No Known Drug Allergies. 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 01/13/2011 Impaired Vision Impaired Hearing Peripheral Neuropathy Anxiety Disorder Hypercholesterolemia Rheumatoid Arthritis   Family History Father. MI Mother. Cancer Siblings. Brother - Colon Cancer    Social History Tobacco use. Former smoker. Alcohol use. Occasional alcohol use. Marital status. Married. Post-Surgical Plans. Home    Medication History Glucosamine Chondroitin Complx ( Oral) Active. Fish Oil Burp-Less ( Oral) Specific dose unknown - Active. Aspirin EC (81MG  Tablet DR, Oral) Active. Centrum Silver ( Oral) Active. Meloxicam (7.5MG  Tablet, Oral) Active. Potassium (99MG  Tablet, Oral) Active. Cetirizine HCl (10MG  Tablet, Oral) Active. Niaspan (500MG  Tablet ER, Oral) Active. Hydrocodone-Acetaminophen (10-325MG  Tablet, Oral) Active. Simvastatin (20MG  Tablet, Oral) Active. PriLOSEC (20MG  Capsule DR, Oral) Active. Xanax (1MG  Tablet, Oral) Active. Testosterone Active. (Injection)    Past Surgical History Knee Surgery Eye Surgery Nose Surgery Ear Surgery   Review of Systems General:Not Present- Chills, Fever, Night Sweats, Fatigue, Weight Gain, Weight Loss and Memory Loss. Skin:Not Present- Hives, Itching, Rash, Eczema and Lesions. HEENT:Not Present- Tinnitus, Headache, Double Vision, Visual Loss, Hearing Loss and Dentures. Respiratory:Not Present- Shortness of breath with exertion, Shortness of breath at rest, Allergies, Coughing up blood and Chronic Cough. Cardiovascular:Not Present- Chest Pain, Racing/skipping heartbeats, Difficulty Breathing Lying Down, Murmur, Swelling and Palpitations. Gastrointestinal:Not Present- Bloody Stool, Heartburn, Abdominal Pain, Vomiting, Nausea, Constipation, Diarrhea, Difficulty Swallowing, Jaundice and Loss of appetitie. Male Genitourinary:Not Present- Urinary frequency, Blood in Urine, Weak urinary stream, Discharge, Flank Pain,  Incontinence, Painful Urination, Urgency, Urinary Retention and Urinating at Night. Musculoskeletal:Not Present- Muscle Weakness, Muscle Pain,  Joint Swelling, Joint Pain, Back Pain, Morning Stiffness and Spasms. Neurological:Not Present- Tremor, Dizziness, Blackout spells, Paralysis, Difficulty with balance and Weakness. Psychiatric:Not Present- Insomnia.    Vitals Weight: 225 lb Height: 72 in Weight was reported by patient. Height was reported by patient. Body Surface Area: 2.28 m Body Mass Index: 30.52 kg/m Pulse: 84 (Regular) Resp.: 14 (Unlabored) BP: 140/78 (Sitting, Right Arm, Standard)   Physical Exam The physical exam findings are as follows:   General Mental Status - Alert, cooperative and good historian. General Appearance- pleasant. Not in acute distress. Orientation- Oriented X3. Build & Nutrition- Well nourished and Well developed.   Head and Neck Head- normocephalic, atraumatic . Neck Global Assessment- supple. no bruit auscultated on the right and no bruit auscultated on the left.   Eye Vision- Wears corrective lenses. Pupil- Bilateral- Regular and Round. Motion- Bilateral- EOMI.   Chest and Lung Exam Auscultation: Breath sounds:- clear at anterior chest wall and - clear at posterior chest wall. Adventitious sounds:- No Adventitious sounds.   Cardiovascular Auscultation:Rhythm- Regular rate and rhythm. Heart Sounds- S1 WNL and S2 WNL. Murmurs & Other Heart Sounds:Auscultation of the heart reveals - No Murmurs.   Abdomen Palpation/Percussion:Tenderness- Abdomen is non-tender to palpation. Rigidity (guarding)- Abdomen is soft. Auscultation:Auscultation of the abdomen reveals - Bowel sounds normal.   Male Genitourinary Not done, not pertinent to present illness  Musculoskeletal Well developed male, alert and oriented in no apparent distress. His right knee shows no swelling. His range of motion is  about 3 to 120 degrees. He does not have any specific tenderness about the knee. There is no instability. Left knee with slight swelling, range 5 to 115, moderate crepitus on range of motion. He is tender medial greater than lateral with no instability.  RADIOGRAPHS: Radiographs from last visit shows his prosthesis on the right is in excellent position. No periprosthetic abnormalities. On the left, he is near bone on bone in the medial compartment with patellofemoral spurring also.   Assessment & Plan Osteoarthritis, Knee (715.96) Impression: Left Knee  Note: Plan is for a Left Total Knee Replacement by Dr. Wynelle Link.  Plan is to go home.  PCP - Dr. Ewell Poe - Patient has been seen preoperatively and felt to be stable for surgery.  The patient does not have any contraindications and will receive TXA (tranexamic acid) prior to surgery.  Signed electronically by Joelene Millin, III PA-C

## 2013-10-23 ENCOUNTER — Encounter (HOSPITAL_COMMUNITY)
Admission: RE | Disposition: A | Discharge status: Home Health | Payer: Self-pay | Source: Ambulatory Visit | Attending: Orthopedic Surgery

## 2013-10-23 ENCOUNTER — Inpatient Hospital Stay (HOSPITAL_COMMUNITY): Payer: Worker's Compensation | Admitting: Anesthesiology

## 2013-10-23 ENCOUNTER — Inpatient Hospital Stay (HOSPITAL_COMMUNITY)
Admission: RE | Admit: 2013-10-23 | Discharge: 2013-10-25 | DRG: 470 | Disposition: A | Discharge status: Home Health | Payer: Worker's Compensation | Source: Ambulatory Visit | Attending: Orthopedic Surgery | Admitting: Orthopedic Surgery

## 2013-10-23 ENCOUNTER — Encounter (HOSPITAL_COMMUNITY): Payer: Worker's Compensation | Admitting: Anesthesiology

## 2013-10-23 ENCOUNTER — Encounter (HOSPITAL_COMMUNITY): Payer: Self-pay

## 2013-10-23 DIAGNOSIS — Z96652 Presence of left artificial knee joint: Secondary | ICD-10-CM

## 2013-10-23 DIAGNOSIS — K219 Gastro-esophageal reflux disease without esophagitis: Secondary | ICD-10-CM | POA: Diagnosis present

## 2013-10-23 DIAGNOSIS — F411 Generalized anxiety disorder: Secondary | ICD-10-CM | POA: Diagnosis present

## 2013-10-23 DIAGNOSIS — Z1509 Genetic susceptibility to other malignant neoplasm: Secondary | ICD-10-CM

## 2013-10-23 DIAGNOSIS — Z96659 Presence of unspecified artificial knee joint: Secondary | ICD-10-CM

## 2013-10-23 DIAGNOSIS — M1710 Unilateral primary osteoarthritis, unspecified knee: Secondary | ICD-10-CM | POA: Diagnosis present

## 2013-10-23 DIAGNOSIS — Z6832 Body mass index (BMI) 32.0-32.9, adult: Secondary | ICD-10-CM

## 2013-10-23 DIAGNOSIS — E7800 Pure hypercholesterolemia, unspecified: Secondary | ICD-10-CM | POA: Diagnosis present

## 2013-10-23 DIAGNOSIS — M179 Osteoarthritis of knee, unspecified: Secondary | ICD-10-CM | POA: Diagnosis present

## 2013-10-23 DIAGNOSIS — G5790 Unspecified mononeuropathy of unspecified lower limb: Secondary | ICD-10-CM | POA: Diagnosis present

## 2013-10-23 DIAGNOSIS — E871 Hypo-osmolality and hyponatremia: Secondary | ICD-10-CM | POA: Diagnosis not present

## 2013-10-23 HISTORY — PX: TOTAL KNEE ARTHROPLASTY: SHX125

## 2013-10-23 LAB — TYPE AND SCREEN
ABO/RH(D): O POS
ANTIBODY SCREEN: NEGATIVE

## 2013-10-23 SURGERY — ARTHROPLASTY, KNEE, TOTAL
Anesthesia: Spinal | Site: Knee | Laterality: Left

## 2013-10-23 MED ORDER — PROPOFOL 10 MG/ML IV BOLUS
INTRAVENOUS | Status: AC
  Filled 2013-10-23: qty 20

## 2013-10-23 MED ORDER — RIVAROXABAN 10 MG PO TABS
10.0000 mg | ORAL_TABLET | Freq: Every day | ORAL | Status: DC
  Administered 2013-10-24 – 2013-10-25 (×2): 10 mg via ORAL
  Filled 2013-10-23 (×3): qty 1

## 2013-10-23 MED ORDER — SODIUM CHLORIDE 0.9 % IJ SOLN
INTRAMUSCULAR | Status: AC
  Filled 2013-10-23: qty 50

## 2013-10-23 MED ORDER — DOCUSATE SODIUM 100 MG PO CAPS
100.0000 mg | ORAL_CAPSULE | Freq: Two times a day (BID) | ORAL | Status: DC
  Administered 2013-10-23 – 2013-10-25 (×5): 100 mg via ORAL

## 2013-10-23 MED ORDER — CEFAZOLIN SODIUM-DEXTROSE 2-3 GM-% IV SOLR
INTRAVENOUS | Status: AC
  Filled 2013-10-23: qty 50

## 2013-10-23 MED ORDER — ALPRAZOLAM 1 MG PO TABS
1.0000 mg | ORAL_TABLET | Freq: Four times a day (QID) | ORAL | Status: DC | PRN
  Administered 2013-10-23 – 2013-10-25 (×5): 1 mg via ORAL
  Filled 2013-10-23 (×5): qty 1

## 2013-10-23 MED ORDER — METHOCARBAMOL 100 MG/ML IJ SOLN
500.0000 mg | Freq: Four times a day (QID) | INTRAVENOUS | Status: DC | PRN
  Administered 2013-10-23: 500 mg via INTRAVENOUS
  Filled 2013-10-23: qty 5

## 2013-10-23 MED ORDER — LORATADINE 10 MG PO TABS
10.0000 mg | ORAL_TABLET | Freq: Every day | ORAL | Status: DC
  Administered 2013-10-24 – 2013-10-25 (×2): 10 mg via ORAL
  Filled 2013-10-23 (×2): qty 1

## 2013-10-23 MED ORDER — BUPIVACAINE HCL (PF) 0.25 % IJ SOLN
INTRAMUSCULAR | Status: AC
  Filled 2013-10-23: qty 30

## 2013-10-23 MED ORDER — SODIUM CHLORIDE 0.9 % IJ SOLN
INTRAMUSCULAR | Status: DC | PRN
  Administered 2013-10-23: 11:00:00

## 2013-10-23 MED ORDER — FENTANYL CITRATE 0.05 MG/ML IJ SOLN
INTRAMUSCULAR | Status: DC | PRN
  Administered 2013-10-23: 100 ug via INTRAVENOUS

## 2013-10-23 MED ORDER — ONDANSETRON HCL 4 MG PO TABS
4.0000 mg | ORAL_TABLET | Freq: Four times a day (QID) | ORAL | Status: DC | PRN
  Filled 2013-10-23: qty 1

## 2013-10-23 MED ORDER — PROMETHAZINE HCL 25 MG/ML IJ SOLN: 6.2500 mg | INTRAMUSCULAR | Status: DC | PRN

## 2013-10-23 MED ORDER — LACTATED RINGERS IV SOLN: INTRAVENOUS | Status: DC

## 2013-10-23 MED ORDER — HYDROMORPHONE HCL PF 1 MG/ML IJ SOLN: 0.2500 mg | INTRAMUSCULAR | Status: DC | PRN

## 2013-10-23 MED ORDER — SODIUM CHLORIDE 0.9 % IV SOLN: INTRAVENOUS | Status: DC

## 2013-10-23 MED ORDER — ZOLPIDEM TARTRATE 10 MG PO TABS: 10.0000 mg | ORAL_TABLET | Freq: Every evening | ORAL | Status: DC | PRN

## 2013-10-23 MED ORDER — ACETAMINOPHEN 325 MG PO TABS: 650.0000 mg | ORAL_TABLET | Freq: Four times a day (QID) | ORAL | Status: DC | PRN

## 2013-10-23 MED ORDER — OXYCODONE HCL 5 MG PO TABS
5.0000 mg | ORAL_TABLET | ORAL | Status: DC | PRN
  Administered 2013-10-23 – 2013-10-25 (×12): 10 mg via ORAL
  Filled 2013-10-23 (×12): qty 2

## 2013-10-23 MED ORDER — ACETAMINOPHEN 10 MG/ML IV SOLN
1000.0000 mg | Freq: Once | INTRAVENOUS | Status: AC
  Administered 2013-10-23: 1000 mg via INTRAVENOUS
  Filled 2013-10-23: qty 100

## 2013-10-23 MED ORDER — BISACODYL 10 MG RE SUPP: 10.0000 mg | Freq: Every day | RECTAL | Status: DC | PRN

## 2013-10-23 MED ORDER — PANTOPRAZOLE SODIUM 40 MG PO TBEC
40.0000 mg | DELAYED_RELEASE_TABLET | Freq: Every day | ORAL | Status: DC
  Filled 2013-10-23: qty 1

## 2013-10-23 MED ORDER — SIMVASTATIN 20 MG PO TABS
20.0000 mg | ORAL_TABLET | Freq: Every morning | ORAL | Status: DC
  Administered 2013-10-24 – 2013-10-25 (×2): 20 mg via ORAL
  Filled 2013-10-23 (×3): qty 1

## 2013-10-23 MED ORDER — METOCLOPRAMIDE HCL 5 MG PO TABS
5.0000 mg | ORAL_TABLET | Freq: Three times a day (TID) | ORAL | Status: DC | PRN
  Filled 2013-10-23: qty 2

## 2013-10-23 MED ORDER — TRANEXAMIC ACID 100 MG/ML IV SOLN
1000.0000 mg | INTRAVENOUS | Status: AC
  Administered 2013-10-23: 1000 mg via INTRAVENOUS
  Filled 2013-10-23: qty 10

## 2013-10-23 MED ORDER — PHENOL 1.4 % MT LIQD: 1.0000 | OROMUCOSAL | Status: DC | PRN

## 2013-10-23 MED ORDER — DEXAMETHASONE 6 MG PO TABS
10.0000 mg | ORAL_TABLET | Freq: Every day | ORAL | Status: AC
  Administered 2013-10-24: 10 mg via ORAL
  Filled 2013-10-23: qty 1

## 2013-10-23 MED ORDER — ACETAMINOPHEN 500 MG PO TABS
1000.0000 mg | ORAL_TABLET | Freq: Four times a day (QID) | ORAL | Status: AC
  Administered 2013-10-23 – 2013-10-24 (×3): 1000 mg via ORAL
  Filled 2013-10-23 (×3): qty 2

## 2013-10-23 MED ORDER — ONDANSETRON HCL 4 MG/2ML IJ SOLN
INTRAMUSCULAR | Status: AC
  Filled 2013-10-23: qty 2

## 2013-10-23 MED ORDER — KETAMINE HCL 10 MG/ML IJ SOLN
INTRAMUSCULAR | Status: AC
  Filled 2013-10-23: qty 1

## 2013-10-23 MED ORDER — DEXAMETHASONE SODIUM PHOSPHATE 10 MG/ML IJ SOLN
10.0000 mg | Freq: Every day | INTRAMUSCULAR | Status: AC
  Filled 2013-10-23: qty 1

## 2013-10-23 MED ORDER — CHLORHEXIDINE GLUCONATE CLOTH 2 % EX PADS: 6.0000 | MEDICATED_PAD | Freq: Once | CUTANEOUS | Status: DC

## 2013-10-23 MED ORDER — METOCLOPRAMIDE HCL 5 MG/ML IJ SOLN: 5.0000 mg | Freq: Three times a day (TID) | INTRAMUSCULAR | Status: DC | PRN

## 2013-10-23 MED ORDER — BUPIVACAINE IN DEXTROSE 0.75-8.25 % IT SOLN
INTRATHECAL | Status: DC | PRN
  Administered 2013-10-23: 2 mL via INTRATHECAL

## 2013-10-23 MED ORDER — CEFAZOLIN SODIUM-DEXTROSE 2-3 GM-% IV SOLR
2.0000 g | Freq: Four times a day (QID) | INTRAVENOUS | Status: AC
  Administered 2013-10-23 (×2): 2 g via INTRAVENOUS
  Filled 2013-10-23 (×3): qty 50

## 2013-10-23 MED ORDER — ACETAMINOPHEN 650 MG RE SUPP
650.0000 mg | Freq: Four times a day (QID) | RECTAL | Status: DC | PRN
  Filled 2013-10-23: qty 1

## 2013-10-23 MED ORDER — PROPOFOL INFUSION 10 MG/ML OPTIME
INTRAVENOUS | Status: DC | PRN
  Administered 2013-10-23: 25 ug/kg/min via INTRAVENOUS

## 2013-10-23 MED ORDER — POLYETHYLENE GLYCOL 3350 17 G PO PACK
17.0000 g | PACK | Freq: Every day | ORAL | Status: DC
  Administered 2013-10-23 – 2013-10-25 (×3): 17 g via ORAL

## 2013-10-23 MED ORDER — DEXAMETHASONE SODIUM PHOSPHATE 10 MG/ML IJ SOLN
10.0000 mg | Freq: Once | INTRAMUSCULAR | Status: AC
  Administered 2013-10-23: 10 mg via INTRAVENOUS

## 2013-10-23 MED ORDER — METHOCARBAMOL 500 MG PO TABS
500.0000 mg | ORAL_TABLET | Freq: Four times a day (QID) | ORAL | Status: DC | PRN
  Administered 2013-10-24 – 2013-10-25 (×4): 500 mg via ORAL
  Filled 2013-10-23 (×4): qty 1

## 2013-10-23 MED ORDER — MORPHINE SULFATE 2 MG/ML IJ SOLN
1.0000 mg | INTRAMUSCULAR | Status: DC | PRN
  Administered 2013-10-23: 2 mg via INTRAVENOUS
  Filled 2013-10-23: qty 1

## 2013-10-23 MED ORDER — POTASSIUM CHLORIDE IN NACL 20-0.9 MEQ/L-% IV SOLN
INTRAVENOUS | Status: DC
  Administered 2013-10-23 – 2013-10-24 (×2): via INTRAVENOUS
  Filled 2013-10-23 (×3): qty 1000

## 2013-10-23 MED ORDER — MIDAZOLAM HCL 5 MG/5ML IJ SOLN
INTRAMUSCULAR | Status: DC | PRN
  Administered 2013-10-23: 2 mg via INTRAVENOUS

## 2013-10-23 MED ORDER — BUPIVACAINE LIPOSOME 1.3 % IJ SUSP
20.0000 mL | Freq: Once | INTRAMUSCULAR | Status: DC
  Filled 2013-10-23: qty 20

## 2013-10-23 MED ORDER — BUPIVACAINE HCL 0.25 % IJ SOLN
INTRAMUSCULAR | Status: DC | PRN
  Administered 2013-10-23: 20 mL

## 2013-10-23 MED ORDER — CEFAZOLIN SODIUM-DEXTROSE 2-3 GM-% IV SOLR
2.0000 g | INTRAVENOUS | Status: AC
  Administered 2013-10-23: 2 g via INTRAVENOUS

## 2013-10-23 MED ORDER — TRAMADOL HCL 50 MG PO TABS
50.0000 mg | ORAL_TABLET | Freq: Four times a day (QID) | ORAL | Status: DC | PRN
  Administered 2013-10-23 – 2013-10-25 (×2): 100 mg via ORAL
  Filled 2013-10-23 (×2): qty 2

## 2013-10-23 MED ORDER — KETAMINE HCL 10 MG/ML IJ SOLN
INTRAMUSCULAR | Status: DC | PRN
  Administered 2013-10-23: 10 mg via INTRAVENOUS
  Administered 2013-10-23: 15 mg via INTRAVENOUS

## 2013-10-23 MED ORDER — DIPHENHYDRAMINE HCL 12.5 MG/5ML PO ELIX: 12.5000 mg | ORAL_SOLUTION | ORAL | Status: DC | PRN

## 2013-10-23 MED ORDER — LACTATED RINGERS IV SOLN
INTRAVENOUS | Status: DC | PRN
  Administered 2013-10-23 (×2): via INTRAVENOUS

## 2013-10-23 MED ORDER — FENTANYL CITRATE 0.05 MG/ML IJ SOLN
INTRAMUSCULAR | Status: AC
  Filled 2013-10-23: qty 2

## 2013-10-23 MED ORDER — FLEET ENEMA 7-19 GM/118ML RE ENEM: 1.0000 | ENEMA | Freq: Once | RECTAL | Status: AC | PRN

## 2013-10-23 MED ORDER — MENTHOL 3 MG MT LOZG: 1.0000 | LOZENGE | OROMUCOSAL | Status: DC | PRN

## 2013-10-23 MED ORDER — SODIUM CHLORIDE 0.9 % IJ SOLN
INTRAMUSCULAR | Status: AC
  Filled 2013-10-23: qty 10

## 2013-10-23 MED ORDER — ONDANSETRON HCL 4 MG/2ML IJ SOLN: 4.0000 mg | Freq: Four times a day (QID) | INTRAMUSCULAR | Status: DC | PRN

## 2013-10-23 MED ORDER — MIDAZOLAM HCL 2 MG/2ML IJ SOLN
INTRAMUSCULAR | Status: AC
  Filled 2013-10-23: qty 2

## 2013-10-23 SURGICAL SUPPLY — 56 items
BAG SPEC THK2 15X12 ZIP CLS (MISCELLANEOUS)
BAG ZIPLOCK 12X15 (MISCELLANEOUS) ×1 IMPLANT
BANDAGE ELASTIC 6 VELCRO ST LF (GAUZE/BANDAGES/DRESSINGS) ×3 IMPLANT
BANDAGE ESMARK 6X9 LF (GAUZE/BANDAGES/DRESSINGS) ×1 IMPLANT
BLADE SAG 18X100X1.27 (BLADE) ×3 IMPLANT
BLADE SAW SGTL 11.0X1.19X90.0M (BLADE) ×3 IMPLANT
BNDG CMPR 9X6 STRL LF SNTH (GAUZE/BANDAGES/DRESSINGS) ×1
BNDG ESMARK 6X9 LF (GAUZE/BANDAGES/DRESSINGS) ×3
BOWL SMART MIX CTS (DISPOSABLE) ×3 IMPLANT
CAPT RP KNEE ×2 IMPLANT
CEMENT HV SMART SET (Cement) ×6 IMPLANT
CLOSURE WOUND 1/2 X4 (GAUZE/BANDAGES/DRESSINGS) ×2
CUFF TOURN SGL QUICK 34 (TOURNIQUET CUFF) ×3
CUFF TRNQT CYL 34X4X40X1 (TOURNIQUET CUFF) ×1 IMPLANT
DECANTER SPIKE VIAL GLASS SM (MISCELLANEOUS) ×3 IMPLANT
DRAPE EXTREMITY T 121X128X90 (DRAPE) ×3 IMPLANT
DRAPE POUCH INSTRU U-SHP 10X18 (DRAPES) ×3 IMPLANT
DRAPE U-SHAPE 47X51 STRL (DRAPES) ×3 IMPLANT
DRSG ADAPTIC 3X8 NADH LF (GAUZE/BANDAGES/DRESSINGS) ×3 IMPLANT
DURAPREP 26ML APPLICATOR (WOUND CARE) ×3 IMPLANT
ELECT REM PT RETURN 9FT ADLT (ELECTROSURGICAL) ×3
ELECTRODE REM PT RTRN 9FT ADLT (ELECTROSURGICAL) ×1 IMPLANT
EVACUATOR 1/8 PVC DRAIN (DRAIN) ×3 IMPLANT
FACESHIELD LNG OPTICON STERILE (SAFETY) ×15 IMPLANT
GLOVE BIO SURGEON STRL SZ7.5 (GLOVE) IMPLANT
GLOVE BIO SURGEON STRL SZ8 (GLOVE) ×13 IMPLANT
GLOVE BIOGEL PI IND STRL 8 (GLOVE) ×2 IMPLANT
GLOVE BIOGEL PI INDICATOR 8 (GLOVE) ×2
GLOVE SURG SS PI 6.5 STRL IVOR (GLOVE) ×2 IMPLANT
GOWN STRL REUS W/TWL LRG LVL3 (GOWN DISPOSABLE) ×7 IMPLANT
GOWN STRL REUS W/TWL XL LVL3 (GOWN DISPOSABLE) ×2 IMPLANT
HANDPIECE INTERPULSE COAX TIP (DISPOSABLE) ×3
IMMOBILIZER KNEE 20 (SOFTGOODS) ×3 IMPLANT
KIT BASIN OR (CUSTOM PROCEDURE TRAY) ×3 IMPLANT
MANIFOLD NEPTUNE II (INSTRUMENTS) ×3 IMPLANT
NDL SAFETY ECLIPSE 18X1.5 (NEEDLE) ×2 IMPLANT
NEEDLE HYPO 18GX1.5 SHARP (NEEDLE) ×6
NS IRRIG 1000ML POUR BTL (IV SOLUTION) ×3 IMPLANT
PACK TOTAL JOINT (CUSTOM PROCEDURE TRAY) ×3 IMPLANT
PAD ABD 8X10 STRL (GAUZE/BANDAGES/DRESSINGS) ×3 IMPLANT
PADDING CAST COTTON 6X4 STRL (CAST SUPPLIES) ×9 IMPLANT
POSITIONER SURGICAL ARM (MISCELLANEOUS) ×3 IMPLANT
SET HNDPC FAN SPRY TIP SCT (DISPOSABLE) ×1 IMPLANT
SPONGE GAUZE 4X4 12PLY (GAUZE/BANDAGES/DRESSINGS) ×3 IMPLANT
STRIP CLOSURE SKIN 1/2X4 (GAUZE/BANDAGES/DRESSINGS) ×4 IMPLANT
SUCTION FRAZIER 12FR DISP (SUCTIONS) ×3 IMPLANT
SUT MNCRL AB 4-0 PS2 18 (SUTURE) ×3 IMPLANT
SUT VIC AB 2-0 CT1 27 (SUTURE) ×9
SUT VIC AB 2-0 CT1 TAPERPNT 27 (SUTURE) ×3 IMPLANT
SUT VLOC 180 0 24IN GS25 (SUTURE) ×3 IMPLANT
SYR 20CC LL (SYRINGE) ×3 IMPLANT
SYR 50ML LL SCALE MARK (SYRINGE) ×3 IMPLANT
TOWEL OR 17X26 10 PK STRL BLUE (TOWEL DISPOSABLE) ×4 IMPLANT
TRAY FOLEY CATH 14FRSI W/METER (CATHETERS) ×3 IMPLANT
WATER STERILE IRR 1500ML POUR (IV SOLUTION) ×3 IMPLANT
WRAP KNEE MAXI GEL POST OP (GAUZE/BANDAGES/DRESSINGS) ×3 IMPLANT

## 2013-10-23 NOTE — Progress Notes (Signed)
Pt arrived to room 1617 from PACU on bed, in CPM. Pt A&Ox4, VSS. Foley draining cl/y urine. Hemovac to rt knee w/ small amt bloody drainage. Pt and wife oriented to callbell and environment. POC discussed.

## 2013-10-23 NOTE — H&P (View-Only) (Signed)
Craig Scott  DOB: 09/06/1956 Married / Language: English / Race: White Male  Date of Admission:  10-23-2013  Chief Complaint:  Left Knee Pain  History of Present Illness The patient is a 57 year old male who comes in for a preoperative History and Physical. The patient is scheduled for a left total knee arthroplasty to be performed by Dr. Frank V. Aluisio, MD at [facility] on 10-23-2013. The patient is a 57 year old male who comes in today 1 year out from right total knee arthroplasty. The patient states that he is doing fair at this time. The pain is under fair control at this time and describe their pain as mild to moderate. Note for "Post TKA": He said he had an incident where a chain saw "kicked back" on him about a week or so ago and hit his knee. He is now having a lot of problems with the left knee also, but we will have to see if we can get work comp approval to see him for that knee. He said he hurts a lot at work. He is not getting swelling. He is not having lower extremity weakness or paresthesia with this. He is now ready to proceed with the left knee at this time. They have been treated conservatively in the past for the above stated problem and despite conservative measures, they continue to have progressive pain and severe functional limitations and dysfunction. They have failed non-operative management including home exercise, medications, and injections. It is felt that they would benefit from undergoing total joint replacement. Risks and benefits of the procedure have been discussed with the patient and they elect to proceed with surgery. There are no active contraindications to surgery such as ongoing infection or rapidly progressive neurological disease.   Allergies No Known Drug Allergies. 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 01/13/2011 Impaired Vision Impaired Hearing Peripheral Neuropathy Anxiety Disorder Hypercholesterolemia Rheumatoid Arthritis   Family History Father. MI Mother. Cancer Siblings. Brother - Colon Cancer    Social History Tobacco use. Former smoker. Alcohol use. Occasional alcohol use. Marital status. Married. Post-Surgical Plans. Home    Medication History Glucosamine Chondroitin Complx ( Oral) Active. Fish Oil Burp-Less ( Oral) Specific dose unknown - Active. Aspirin EC (81MG Tablet DR, Oral) Active. Centrum Silver ( Oral) Active. Meloxicam (7.5MG Tablet, Oral) Active. Potassium (99MG Tablet, Oral) Active. Cetirizine HCl (10MG Tablet, Oral) Active. Niaspan (500MG Tablet ER, Oral) Active. Hydrocodone-Acetaminophen (10-325MG Tablet, Oral) Active. Simvastatin (20MG Tablet, Oral) Active. PriLOSEC (20MG Capsule DR, Oral) Active. Xanax (1MG Tablet, Oral) Active. Testosterone Active. (Injection)    Past Surgical History Knee Surgery Eye Surgery Nose Surgery Ear Surgery   Review of Systems General:Not Present- Chills, Fever, Night Sweats, Fatigue, Weight Gain, Weight Loss and Memory Loss. Skin:Not Present- Hives, Itching, Rash, Eczema and Lesions. HEENT:Not Present- Tinnitus, Headache, Double Vision, Visual Loss, Hearing Loss and Dentures. Respiratory:Not Present- Shortness of breath with exertion, Shortness of breath at rest, Allergies, Coughing up blood and Chronic Cough. Cardiovascular:Not Present- Chest Pain, Racing/skipping heartbeats, Difficulty Breathing Lying Down, Murmur, Swelling and Palpitations. Gastrointestinal:Not Present- Bloody Stool, Heartburn, Abdominal Pain, Vomiting, Nausea, Constipation, Diarrhea, Difficulty Swallowing, Jaundice and Loss of appetitie. Male Genitourinary:Not Present- Urinary frequency, Blood in Urine, Weak urinary stream, Discharge, Flank Pain,  Incontinence, Painful Urination, Urgency, Urinary Retention and Urinating at Night. Musculoskeletal:Not Present- Muscle Weakness, Muscle Pain,   Joint Swelling, Joint Pain, Back Pain, Morning Stiffness and Spasms. Neurological:Not Present- Tremor, Dizziness, Blackout spells, Paralysis, Difficulty with balance and Weakness. Psychiatric:Not Present- Insomnia.    Vitals Weight: 225 lb Height: 72 in Weight was reported by patient. Height was reported by patient. Body Surface Area: 2.28 m Body Mass Index: 30.52 kg/m Pulse: 84 (Regular) Resp.: 14 (Unlabored) BP: 140/78 (Sitting, Right Arm, Standard)   Physical Exam The physical exam findings are as follows:   General Mental Status - Alert, cooperative and good historian. General Appearance- pleasant. Not in acute distress. Orientation- Oriented X3. Build & Nutrition- Well nourished and Well developed.   Head and Neck Head- normocephalic, atraumatic . Neck Global Assessment- supple. no bruit auscultated on the right and no bruit auscultated on the left.   Eye Vision- Wears corrective lenses. Pupil- Bilateral- Regular and Round. Motion- Bilateral- EOMI.   Chest and Lung Exam Auscultation: Breath sounds:- clear at anterior chest wall and - clear at posterior chest wall. Adventitious sounds:- No Adventitious sounds.   Cardiovascular Auscultation:Rhythm- Regular rate and rhythm. Heart Sounds- S1 WNL and S2 WNL. Murmurs & Other Heart Sounds:Auscultation of the heart reveals - No Murmurs.   Abdomen Palpation/Percussion:Tenderness- Abdomen is non-tender to palpation. Rigidity (guarding)- Abdomen is soft. Auscultation:Auscultation of the abdomen reveals - Bowel sounds normal.   Male Genitourinary Not done, not pertinent to present illness  Musculoskeletal Well developed male, alert and oriented in no apparent distress. His right knee shows no swelling. His range of motion is  about 3 to 120 degrees. He does not have any specific tenderness about the knee. There is no instability. Left knee with slight swelling, range 5 to 115, moderate crepitus on range of motion. He is tender medial greater than lateral with no instability.  RADIOGRAPHS: Radiographs from last visit shows his prosthesis on the right is in excellent position. No periprosthetic abnormalities. On the left, he is near bone on bone in the medial compartment with patellofemoral spurring also.   Assessment & Plan Osteoarthritis, Knee (715.96) Impression: Left Knee  Note: Plan is for a Left Total Knee Replacement by Dr. Wynelle Link.  Plan is to go home.  PCP - Dr. Ewell Poe - Patient has been seen preoperatively and felt to be stable for surgery.  The patient does not have any contraindications and will receive TXA (tranexamic acid) prior to surgery.  Signed electronically by Joelene Millin, III PA-C

## 2013-10-23 NOTE — Anesthesia Postprocedure Evaluation (Signed)
Anesthesia Post Note  Patient: Craig Scott  Procedure(s) Performed: Procedure(s) (LRB): LEFT TOTAL KNEE ARTHROPLASTY (Left)  Anesthesia type: Spinal  Patient location: PACU  Post pain: Pain level controlled  Post assessment: Post-op Vital signs reviewed  Last Vitals:  Filed Vitals:   10/23/13 1534  BP: 134/81  Pulse: 88  Temp: 36.4 C  Resp: 16    Post vital signs: Reviewed  Level of consciousness: sedated  Complications: No apparent anesthesia complications

## 2013-10-23 NOTE — Anesthesia Procedure Notes (Signed)
Spinal  Patient location during procedure: OR Start time: 10/23/2013 9:38 AM End time: 10/23/2013 9:45 AM Staffing Anesthesiologist: Freddie Apley F Performed by: anesthesiologist  Preanesthetic Checklist Completed: patient identified, site marked, surgical consent, pre-op evaluation, timeout performed, IV checked, risks and benefits discussed and monitors and equipment checked Spinal Block Patient position: sitting Prep: Betadine Patient monitoring: heart rate, continuous pulse ox and blood pressure Location: L3-4 Injection technique: single-shot Needle Needle type: Spinocan  Needle gauge: 25 G Needle length: 9 cm Additional Notes Expiration date of kit checked and confirmed. Patient tolerated procedure well, without complications. Negative heme/paresthesia

## 2013-10-23 NOTE — Interval H&P Note (Signed)
History and Physical Interval Note:  10/23/2013 7:03 AM  Craig Scott  has presented today for surgery, with the diagnosis of OA OF LEFT KNEE  The various methods of treatment have been discussed with the patient and family. After consideration of risks, benefits and other options for treatment, the patient has consented to  Procedure(s): LEFT TOTAL KNEE ARTHROPLASTY (Left) as a surgical intervention .  The patient's history has been reviewed, patient examined, no change in status, stable for surgery.  I have reviewed the patient's chart and labs.  Questions were answered to the patient's satisfaction.     [name]

## 2013-10-23 NOTE — Evaluation (Signed)
Physical Therapy Evaluation Patient Details Name: Craig Scott MRN: [id number] DOB: [birth year] Today's Date: 10/23/2013 Time: 1729-1750 PT Time Calculation (min): 21 min  PT Assessment / Plan / Recommendation History of Present Illness  s/p L TKA  Clinical Impression  Pt will benefit from PT to address deficits below   PT Assessment       Follow Up Recommendations  Home health PT    Does the patient have the potential to tolerate intense rehabilitation      Barriers to Discharge        Equipment Recommendations  Rolling walker with 5" wheels (pt wants hospital bed)    Recommendations for Other Services     Frequency      Precautions / Restrictions Precautions Precautions: Fall;Knee Required Braces or Orthoses: Knee Immobilizer - Left Knee Immobilizer - Left: Discontinue once straight leg raise with < 10 degree lag   Pertinent Vitals/Pain VSS Pain controlled, premedicated      Mobility  Bed Mobility Overal bed mobility: Needs Assistance Bed Mobility: Supine to Sit Supine to sit: Min assist General bed mobility comments: cues to self assist and for safe technique Transfers Overall transfer level: Needs assistance Equipment used: Rolling walker (2 wheeled) Transfers: Sit to/from Stand Sit to Stand: Min assist General transfer comment: cues for hand placement and LLE position Ambulation/Gait Ambulation/Gait assistance: Min assist Ambulation Distance (Feet): 80 Feet Assistive device: Rolling walker (2 wheeled) Gait Pattern/deviations: Step-to pattern General Gait Details: cues for sequence and RW position    Exercises Total Joint Exercises Ankle Circles/Pumps: AROM;Both;5 reps Quad Sets: 5 reps;Both;AROM   PT Diagnosis:    PT Problem List:   PT Treatment Interventions:       PT Goals(Current goals can be found in the care plan section) Acute Rehab PT Goals PT Goal Formulation: With patient Time For Goal Achievement: 10/30/13  Visit Information  Last PT Received On: 10/23/13 Assistance Needed: +1 History of Present Illness: s/p L TKA       Prior Functioning  Home Living Family/patient expects to be discharged to:: Private residence Living Arrangements: Spouse/significant other Type of Home: House Home Access: Stairs to enter Technical brewer of Steps: 2 Home Layout: Two level Alternate Level Stairs-Number of Steps: 9 and 9 more Home Equipment: None Additional Comments: wants hospital bed since he has a 2 story home Prior Function Level of Independence: Independent Communication Communication: No difficulties    Cognition  Cognition Arousal/Alertness: Awake/alert Behavior During Therapy: WFL for tasks assessed/performed Overall Cognitive Status: Within Functional Limits for tasks assessed    Extremity/Trunk Assessment Upper Extremity Assessment Upper Extremity Assessment: Defer to OT evaluation Lower Extremity Assessment Lower Extremity Assessment: LLE deficits/detail LLE Deficits / Details: able to assist with SLR, ankle WFL   Balance    End of Session PT - End of Session Equipment Utilized During Treatment: Gait belt Activity Tolerance: Patient tolerated treatment well Patient left: in chair;with family/visitor present;with call bell/phone within reach Nurse Communication: Mobility status CPM Left Knee CPM Left Knee: Off  GP     [facility] 10/23/2013, 5:55 PM

## 2013-10-23 NOTE — Op Note (Signed)
Pre-operative diagnosis- Osteoarthritis  Left knee(s)  Post-operative diagnosis- Osteoarthritis Left knee(s)  Procedure-  Left  Total Knee Arthroplasty  Surgeon- Dione Plover. Aidynn Krenn, MD  Assistant- Ardeen Jourdain, PA-C   Anesthesia-  Spinal EBL-* No blood loss amount entered *  Drains Hemovac  Tourniquet time-  Total Tourniquet Time Documented: Thigh (Left) - 40 minutes Total: Thigh (Left) - 40 minutes    Complications- None  Condition-PACU - hemodynamically stable.   Brief Clinical Note   Craig SPLAWN is a 58 y.o. year old male with end stage OA of his left knee with progressively worsening pain and dysfunction. He has constant pain, with activity and at rest and significant functional deficits with difficulties even with ADLs. He has had extensive non-op management including analgesics, injections of cortisone and viscosupplements, and home exercise program, but remains in significant pain with significant dysfunction. Radiographs show bone on bone arthritis medial and aptellofemoral. He presents now for left Total Knee Arthroplasty.     Procedure in detail---   The patient is brought into the operating room and positioned supine on the operating table. After successful administration of  Spinal,   a tourniquet is placed high on the  Left thigh(s) and the lower extremity is prepped and draped in the usual sterile fashion. Time out is performed by the operating team and then the  Left lower extremity is wrapped in Esmarch, knee flexed and the tourniquet inflated to 300 mmHg.       A midline incision is made with a ten blade through the subcutaneous tissue to the level of the extensor mechanism. A fresh blade is used to make a medial parapatellar arthrotomy. Soft tissue over the proximal medial tibia is subperiosteally elevated to the joint line with a knife and into the semimembranosus bursa with a Cobb elevator. Soft tissue over the proximal lateral tibia is elevated with attention  being paid to avoiding the patellar tendon on the tibial tubercle. The patella is everted, knee flexed 90 degrees and the ACL and PCL are removed. Findings are bone on bone medial and patellofemoral with large medial osteophytes.       The drill is used to create a starting hole in the distal femur and the canal is thoroughly irrigated with sterile saline to remove the fatty contents. The 5 degree Left  valgus alignment guide is placed into the femoral canal and the distal femoral cutting block is pinned to remove 10 mm off the distal femur. Resection is made with an oscillating saw.      The tibia is subluxed forward and the menisci are removed. The extramedullary alignment guide is placed referencing proximally at the medial aspect of the tibial tubercle and distally along the second metatarsal axis and tibial crest. The block is pinned to remove 68mm off the more deficient medial  side. Resection is made with an oscillating saw. Size 4is the most appropriate size for the tibia and the proximal tibia is prepared with the modular drill and keel punch for that size.      The femoral sizing guide is placed and size 4 is most appropriate. Rotation is marked off the epicondylar axis and confirmed by creating a rectangular flexion gap at 90 degrees. The size 4 cutting block is pinned in this rotation and the anterior, posterior and chamfer cuts are made with the oscillating saw. The intercondylar block is then placed and that cut is made.      Trial size 4 tibial component, trial size  4 posterior stabilized femur and a 12.5  mm posterior stabilized rotating platform insert trial is placed. Full extension is achieved with excellent varus/valgus and anterior/posterior balance throughout full range of motion. The patella is everted and thickness measured to be 27  mm. Free hand resection is taken to 15 mm, a 41 template is placed, lug holes are drilled, trial patella is placed, and it tracks normally. Osteophytes are  removed off the posterior femur with the trial in place. All trials are removed and the cut bone surfaces prepared with pulsatile lavage. Cement is mixed and once ready for implantation, the size 4 tibial implant, size  4 posterior stabilized femoral component, and the size 41 patella are cemented in place and the patella is held with the clamp. The trial insert is placed and the knee held in full extension. The Exparel (20 ml mixed with 30 ml saline) and .25% Bupivicaine, are injected into the extensor mechanism, posterior capsule, medial and lateral gutters and subcutaneous tissues.  All extruded cement is removed and once the cement is hard the permanent 12.5 mm posterior stabilized rotating platform insert is placed into the tibial tray.      The wound is copiously irrigated with saline solution and the extensor mechanism closed over a hemovac drain with #1 PDS suture. The tourniquet is released for a total tourniquet time of 40  minutes. Flexion against gravity is 140 degrees and the patella tracks normally. Subcutaneous tissue is closed with 2.0 vicryl and subcuticular with running 4.0 Monocryl. The incision is cleaned and dried and steri-strips and a bulky sterile dressing are applied. The limb is placed into a knee immobilizer and the patient is awakened and transported to recovery in stable condition.      Please note that a surgical assistant was a medical necessity for this procedure in order to perform it in a safe and expeditious manner. Surgical assistant was necessary to retract the ligaments and vital neurovascular structures to prevent injury to them and also necessary for proper positioning of the limb to allow for anatomic placement of the prosthesis.   Dione Plover Bernhard Koskinen, MD    10/23/2013, 10:42 AM

## 2013-10-23 NOTE — Anesthesia Preprocedure Evaluation (Signed)
Anesthesia Evaluation  Patient identified by MRN, date of birth, ID band Patient awake    Reviewed: Allergy & Precautions, H&P , NPO status , Patient's Chart, lab work & pertinent test results  Airway       Dental   Pulmonary neg pulmonary ROS, former smoker,          Cardiovascular negative cardio ROS      Neuro/Psych negative neurological ROS  negative psych ROS   GI/Hepatic Neg liver ROS, GERD-  Medicated,  Endo/Other  negative endocrine ROS  Renal/GU negative Renal ROS  negative genitourinary   Musculoskeletal negative musculoskeletal ROS (+)   Abdominal   Peds  Hematology negative hematology ROS (+)   Anesthesia Other Findings   Reproductive/Obstetrics                           Anesthesia Physical Anesthesia Plan  ASA: II  Anesthesia Plan:    Post-op Pain Management:    Induction:   Airway Management Planned:   Additional Equipment:   Intra-op Plan:   Post-operative Plan:   Informed Consent:   Plan Discussed with:   Anesthesia Plan Comments:         Anesthesia Quick Evaluation

## 2013-10-23 NOTE — Transfer of Care (Signed)
Immediate Anesthesia Transfer of Care Note  Patient: Craig Scott  Procedure(s) Performed: Procedure(s): LEFT TOTAL KNEE ARTHROPLASTY (Left)  Patient Location: PACU  Anesthesia Type:Regional  Level of Consciousness: awake and sedated  Airway & Oxygen Therapy: Patient Spontanous Breathing and Patient connected to face mask oxygen  Post-op Assessment: Report given to PACU RN and Post -op Vital signs reviewed and stable  Post vital signs: stable  Complications: No apparent anesthesia complications

## 2013-10-24 DIAGNOSIS — E871 Hypo-osmolality and hyponatremia: Secondary | ICD-10-CM | POA: Diagnosis not present

## 2013-10-24 LAB — CBC
HEMATOCRIT: 36.7 % — AB (ref 39.0–52.0)
Hemoglobin: 12.7 g/dL — ABNORMAL LOW (ref 13.0–17.0)
MCH: 30.5 pg (ref 26.0–34.0)
MCHC: 34.6 g/dL (ref 30.0–36.0)
MCV: 88 fL (ref 78.0–100.0)
Platelets: 284 10*3/uL (ref 150–400)
RBC: 4.17 MIL/uL — ABNORMAL LOW (ref 4.22–5.81)
RDW: 12 % (ref 11.5–15.5)
WBC: 16.3 10*3/uL — AB (ref 4.0–10.5)

## 2013-10-24 LAB — BASIC METABOLIC PANEL
BUN: 11 mg/dL (ref 6–23)
CHLORIDE: 99 meq/L (ref 96–112)
CO2: 24 mEq/L (ref 19–32)
Calcium: 8.4 mg/dL (ref 8.4–10.5)
Creatinine, Ser: 0.8 mg/dL (ref 0.50–1.35)
GFR calc Af Amer: >90 mL/min (ref >90)
GFR calc non Af Amer: >90 mL/min (ref >90)
GLUCOSE: 143 mg/dL — AB (ref 70–99)
Potassium: 4.3 mEq/L (ref 3.7–5.3)
Sodium: 135 mEq/L — ABNORMAL LOW (ref 137–147)

## 2013-10-24 MED ORDER — OMEPRAZOLE 20 MG PO CPDR
20.0000 mg | DELAYED_RELEASE_CAPSULE | Freq: Every day | ORAL | Status: DC
  Administered 2013-10-24 – 2013-10-25 (×2): 20 mg via ORAL
  Filled 2013-10-24 (×3): qty 1

## 2013-10-24 NOTE — Evaluation (Signed)
Occupational Therapy Evaluation Patient Details Name: Craig Scott MRN: [id number] DOB: [birth year] Today's Date: 10/24/2013 Time: 7741-2878 OT Time Calculation (min): 50 min  OT Assessment / Plan / Recommendation History of present illness s/p L TKA   Clinical Impression   Pt up to bathroom to void and practice 3in1 transfer. Wife present for education. Pt would like to see AE and decide if he would like to obtain. Will plan on AE education for next session. Will benefit from continued OT to improve ADL independence and safety for d/c home with wife.    OT Assessment  Patient needs continued OT Services    Follow Up Recommendations  No OT follow up;Supervision/Assistance - 24 hour    Barriers to Discharge      Equipment Recommendations  3 in 1 bedside comode;Hospital bed    Recommendations for Other Services    Frequency  Min 2X/week    Precautions / Restrictions Precautions Precautions: Fall;Knee Knee Immobilizer - Left:  (pt did SLR during OT session) Restrictions Weight Bearing Restrictions: No   Pertinent Vitals/Pain 7/10 with activity 6/10 after activity; reposition, ice    ADL  Eating/Feeding: Simulated;Independent Where Assessed - Eating/Feeding: Chair Grooming: Simulated;Wash/dry hands;Set up Where Assessed - Grooming: Supported sitting Upper Body Bathing: Simulated;Chest;Right arm;Left arm;Abdomen;Set up Where Assessed - Upper Body Bathing: Unsupported sitting Lower Body Bathing: Simulated;Moderate assistance Where Assessed - Lower Body Bathing: Supported sit to stand Upper Body Dressing: Simulated;Set up Where Assessed - Upper Body Dressing: Unsupported sitting Lower Body Dressing: Simulated;Moderate assistance Where Assessed - Lower Body Dressing: Supported sit to Lobbyist: Performed;Minimal Manufacturing systems engineer: Raised toilet seat with arms (or 3-in-1 over toilet) Toileting - Clothing Manipulation and Hygiene:  Simulated;Minimal assistance Where Assessed - Best boy and Hygiene: Sit to stand from 3-in-1 or toilet Equipment Used: Rolling walker;Gait belt ADL Comments: Pt stood to attempt to use urinal for about 2 minutes but couldnt void. Transferred into bathroom and sat on 3in1 and was able to void into urinal in seated position. Will plan to sponge initially as pt staying on main level and has shower upstairs. Will need 3in1.     OT Diagnosis: Generalized weakness  OT Problem List: Decreased strength;Decreased knowledge of use of DME or AE OT Treatment Interventions: Self-care/ADL training;DME and/or AE instruction;Therapeutic activities;Patient/family education   OT Goals(Current goals can be found in the care plan section) Acute Rehab OT Goals Patient Stated Goal: return to work OT Goal Formulation: With patient/family Time For Goal Achievement: 10/31/13 Potential to Achieve Goals: Good  Visit Information  Last OT Received On: 10/24/13 Assistance Needed: +1 History of Present Illness: s/p L TKA       Prior Aneta expects to be discharged to:: Private residence Living Arrangements: Spouse/significant other Type of Home: House Home Access: Stairs to enter Technical brewer of Steps: 2 Home Layout: Two level Alternate Level Stairs-Number of Steps: 9 and 9 more Home Equipment: None Additional Comments: wants hospital bed since he has a 2 story home Prior Function Level of Independence: Independent Communication Communication: No difficulties         Vision/Perception     Cognition  Cognition Arousal/Alertness: Awake/alert Behavior During Therapy: WFL for tasks assessed/performed Overall Cognitive Status: Within Functional Limits for tasks assessed    Extremity/Trunk Assessment Upper Extremity Assessment Upper Extremity Assessment: Overall WFL for tasks assessed     Mobility Transfers Overall transfer  level: Needs assistance Equipment used: Rolling walker (  2 wheeled) Transfers: Sit to/from Stand Sit to Stand: Min assist General transfer comment: verbal cues for hand placement and L management     Exercise     Balance     End of Session OT - End of Session Equipment Utilized During Treatment: Gait belt;Rolling walker Activity Tolerance: Patient tolerated treatment well Patient left: in chair;with call bell/phone within reach CPM Left Knee CPM Left Knee: Off  [place], [place] [phone] 10/24/2013, 10:05 AM

## 2013-10-24 NOTE — Progress Notes (Signed)
Physical Therapy Treatment Patient Details Name: Craig Scott MRN: [id number] DOB: 1955-12-11 Today's Date: 10/24/2013 Time: 0174-9449 PT Time Calculation (min): 33 min  PT Assessment / Plan / Recommendation  History of Present Illness s/p L TKA   PT Comments   Pt reports this surgery is going much better.   Follow Up Recommendations  Home health PT     Does the patient have the potential to tolerate intense rehabilitation     Barriers to Discharge        Equipment Recommendations  Rolling walker with 5" wheels;3in1 (PT)    Recommendations for Other Services    Frequency 7X/week   Progress towards PT Goals Progress towards PT goals: Progressing toward goals  Plan Current plan remains appropriate    Precautions / Restrictions Precautions Precautions: Fall;Knee   Pertinent Vitals/Pain 6 L knee , ice applied.    Mobility  Bed Mobility Bed Mobility: Sit to Supine Sit to supine: Min assist General bed mobility comments: cues to self assist and for safe technique, support LLE onto bed. Transfers Overall transfer level: Needs assistance Equipment used: Rolling walker (2 wheeled) Transfers: Sit to/from Stand Sit to Stand: Supervision General transfer comment: verbal cues for hand placement and LE management Ambulation/Gait Ambulation/Gait assistance: Min guard Ambulation Distance (Feet): 250 Feet Assistive device: Rolling walker (2 wheeled) Gait Pattern/deviations: Step-to pattern;Antalgic General Gait Details: cues for sequence and RW position    Exercises Total Joint Exercises Quad Sets: AROM;Strengthening;Both;10 reps   PT Diagnosis:    PT Problem List:   PT Treatment Interventions:     PT Goals (current goals can now be found in the care plan section)    Visit Information  Last PT Received On: 10/24/13 Assistance Needed: +1 History of Present Illness: s/p L TKA    Subjective Data      Cognition  Cognition Arousal/Alertness: Awake/alert     Balance     End of Session PT - End of Session Activity Tolerance: Patient tolerated treatment well Patient left: in bed;with call bell/phone within reach;with family/visitor present Nurse Communication: Mobility status   GP     Claretha Cooper 10/24/2013, 3:16 PM

## 2013-10-24 NOTE — Discharge Instructions (Addendum)
° °Dr. Frank Aluisio °Total Joint Specialist °[facility] °[address] °[place], [place] [address] °[phone] ° °TOTAL KNEE REPLACEMENT POSTOPERATIVE DIRECTIONS ° ° ° °Knee Rehabilitation, Guidelines Following Surgery  °Results after knee surgery are often greatly improved when you follow the exercise, range of motion and muscle strengthening exercises prescribed by your doctor. Safety measures are also important to protect the knee from further injury. Any time any of these exercises cause you to have increased pain or swelling in your knee joint, decrease the amount until you are comfortable again and slowly increase them. If you have problems or questions, call your caregiver or physical therapist for advice.  ° °HOME CARE INSTRUCTIONS  °Remove items at home which could result in a fall. This includes throw rugs or furniture in walking pathways.  °Continue medications as instructed at time of discharge. °You may have some home medications which will be placed on hold until you complete the course of blood thinner medication.  °You may start showering once you are discharged home but do not submerge the incision under water. Just pat the incision dry and apply a dry gauze dressing on daily. °Walk with walker as instructed.  °You may resume a sexual relationship in one month or when given the OK by  your doctor.  °· Use walker as long as suggested by your caregivers. °· Avoid periods of inactivity such as sitting longer than an hour when not asleep. This helps prevent blood clots.  °You may put full weight on your legs and walk as much as is comfortable.  °You may return to work once you are cleared by your doctor.  °Do not drive a car for 6 weeks or until released by you surgeon.  °· Do not drive while taking narcotics.  °Wear the elastic stockings for three weeks following surgery during the day but you may remove then at night. °Make sure you keep all of your appointments after your  operation with all of your doctors and caregivers. You should call the office at the above phone number and make an appointment for approximately two weeks after the date of your surgery. °Change the dressing daily and reapply a dry dressing each time. °Please pick up a stool softener and laxative for home use as long as you are requiring pain medications. °· Continue to use ice on the knee for pain and swelling from surgery. You may notice swelling that will progress down to the foot and ankle.  This is normal after surgery.  Elevate the leg when you are not up walking on it.   °It is important for you to complete the blood thinner medication as prescribed by your doctor. °· Continue to use the breathing machine which will help keep your temperature down.  It is common for your temperature to cycle up and down following surgery, especially at night when you are not up moving around and exerting yourself.  The breathing machine keeps your lungs expanded and your temperature down. ° °RANGE OF MOTION AND STRENGTHENING EXERCISES  °Rehabilitation of the knee is important following a knee injury or an operation. After just a few days of immobilization, the muscles of the thigh which control the knee become weakened and shrink (atrophy). Knee exercises are designed to build up the tone and strength of the thigh muscles and to improve knee motion. Often times heat used for twenty to thirty minutes before working out will loosen up your tissues and help with improving the   range of motion but do not use heat for the first two weeks following surgery. These exercises can be done on a training (exercise) mat, on the floor, on a table or on a bed. Use what ever works the best and is most comfortable for you Knee exercises include:  Leg Lifts - While your knee is still immobilized in a splint or cast, you can do straight leg raises. Lift the leg to 60 degrees, hold for 3 sec, and slowly lower the leg. Repeat 10-20 times 2-3  times daily. Perform this exercise against resistance later as your knee gets better.  Quad and Hamstring Sets - Tighten up the muscle on the front of the thigh (Quad) and hold for 5-10 sec. Repeat this 10-20 times hourly. Hamstring sets are done by pushing the foot backward against an object and holding for 5-10 sec. Repeat as with quad sets.  A rehabilitation program following serious knee injuries can speed recovery and prevent re-injury in the future due to weakened muscles. Contact your doctor or a physical therapist for more information on knee rehabilitation.   SKILLED REHAB INSTRUCTIONS: If the patient is transferred to a skilled rehab facility following release from the hospital, a list of the current medications will be sent to the facility for the patient to continue.  When discharged from the skilled rehab facility, please have the facility set up the patient's [place] prior to being released. Also, the skilled facility will be responsible for providing the patient with their medications at time of release from the facility to include their pain medication, the muscle relaxants, and their blood thinner medication. If the patient is still at the rehab facility at time of the two week follow up appointment, the skilled rehab facility will also need to assist the patient in arranging follow up appointment in our office and any transportation needs.  MAKE SURE YOU:  Understand these instructions.  Will watch your condition.  Will get help right away if you are not doing well or get worse.    Pick up stool softner and laxative for home. Do not submerge incision under water. May shower. Continue to use ice for pain and swelling from surgery.  Take Xarelto for two and a half more weeks, then discontinue Xarelto. Once the patient has completed the Xarelto, they may resume the 81 mg Aspirin.     Information on my medicine - XARELTO (Rivaroxaban)  This medication  education was reviewed with me or my healthcare representative as part of my discharge preparation.  The pharmacist that spoke with me during my hospital stay was:  [place], Gaye Alken, [facility]  Why was Xarelto prescribed for you? Xarelto was prescribed for you to reduce the risk of blood clots forming after orthopedic surgery. The medical term for these abnormal blood clots is venous thromboembolism (VTE).  What do you need to know about xarelto ? Take your Xarelto ONCE DAILY at the same time every day. You may take it either with or without food.  If you have difficulty swallowing the tablet whole, you may crush it and mix in applesauce just prior to taking your dose.  Take Xarelto exactly as prescribed by your doctor and DO NOT stop taking Xarelto without talking to the doctor who prescribed the medication.  Stopping without other VTE prevention medication to take the place of Xarelto may increase your risk of developing a clot.  After discharge, you should have regular check-up appointments with your healthcare provider  that is prescribing your Xarelto.    What do you do if you miss a dose? If you miss a dose, take it as soon as you remember on the same day then continue your regularly scheduled once daily regimen the next day. Do not take two doses of Xarelto on the same day.   Important Safety Information A possible side effect of Xarelto is bleeding. You should call your healthcare provider right away if you experience any of the following:   Bleeding from an injury or your nose that does not stop.   Unusual colored urine (red or dark brown) or unusual colored stools (red or black).   Unusual bruising for unknown reasons.   A serious fall or if you hit your head (even if there is no bleeding).  Some medicines may interact with Xarelto and might increase your risk of bleeding while on Xarelto. To help avoid this, consult your healthcare provider or pharmacist prior to  using any new prescription or non-prescription medications, including herbals, vitamins, non-steroidal anti-inflammatory drugs (NSAIDs) and supplements.  This website has more information on Xarelto: [email or web address]

## 2013-10-24 NOTE — Care Management Note (Addendum)
    Page 1 of 2   10/25/2013     6:44:18 PM   CARE MANAGEMENT NOTE 10/25/2013  Patient:  Craig Scott, Craig Scott   Account Number:  [id number]  Date Initiated:  10/24/2013  Documentation initiated by:  Sherrin Daisy  Subjective/Objective Assessment:   dx OA left knee; total knee replacemnt     Action/Plan:   CM spoke with patient. Plans are for him to return to his home where spoke will be caregiver. States he will need hospital bed, RW and commode.   Anticipated DC Date:  10/25/2013   Anticipated DC Plan:  [place]  CM consult      [facility] Choice  HOME HEALTH   Choice offered to / List presented to:  C-1 Patient   DME arranged  3-N-1  [place] arranged  HH-2 PT      Status of service:  Completed, signed off Medicare Important Message given?   (If response is "NO", the following Medicare IM given date fields will be blank) Date Medicare IM given:   Date Additional Medicare IM given:    Discharge Disposition:  [place]  Per UR Regulation:  Reviewed for med. necessity/level of care/duration of stay  If discussed at [place] of Stay Meetings, dates discussed:    Comments:  10/25/2013 Sherrin Daisy BSN RN CCM [phone] Received call bck from [place] [phone]; advised of pt's [facility] & dme needs; she requested that request of HHpt and DME be faxed to [phone]. States this would be assigned for worker's case manager to set up services. States could take several hours. Received call bck from Case Deeann Saint [phone]  at [phone]; she advised that she would not be able to set up services in timely manner d/t pt being discharged today and services needed tomorrow. Requested that agency in area could be used and that authoriaztion for [facility] services and DME would be aiuthorized for agency. CM contacted [facility] and was advised  of the above. Contact information was supplied to [facility] agency.  Received call bck from [place] that she had spoken with [facility] worker's comp personel and that they would be able to arrange for DME> Receved call from [facility] rep who advised that they would be able to set up [place] services fr patient with start date of tomorrow 10/26/2013.  Per [facility] dme rep hospital bed will be delivered this pm. Pt was advised of all of the abve and given contact number for [facility]. Pt vices understanding. RW was del to room. 3n1 will be delivered to pt's home with hospital bed.  10/24/2013 Sherrin Daisy BSN RN CCM [phone] Worker's Comp Claim #[id number] Nickola Major ] PO [address] [phone] [place] [address] [place] [phone]  CM spoke with [place] customer service/state another adjuster is on case-Becky Hernandez-[phone]. CM left msg on adjuster's voice mail requesting call bck. CM will follow.

## 2013-10-24 NOTE — Progress Notes (Signed)
Physical Therapy Treatment Patient Details Name: Craig Scott MRN: [id number] DOB: [birth year] Today's Date: 10/24/2013 Time: 8110-3159 PT Time Calculation (min): 32 min  PT Assessment / Plan / Recommendation  History of Present Illness s/p L TKA   PT Comments   Pt progressing well,pleased with progress thus far as compared to last surgery  Follow Up Recommendations  Home health PT     Does the patient have the potential to tolerate intense rehabilitation     Barriers to Discharge        Equipment Recommendations  Rolling walker with 5" wheels (pt wants hospital bed)    Recommendations for Other Services    Frequency 7X/week   Progress towards PT Goals Progress towards PT goals: Progressing toward goals  Plan Current plan remains appropriate    Precautions / Restrictions Precautions Precautions: Fall;Knee Required Braces or Orthoses: Knee Immobilizer - Left Knee Immobilizer - Left: Discontinue once straight leg raise with < 10 degree lag (D/c'd today) Restrictions Weight Bearing Restrictions: No Other Position/Activity Restrictions: WBAT   Pertinent Vitals/Pain Pain controlled, was premedicated    Mobility  Transfers Overall transfer level: Needs assistance Equipment used: Rolling walker (2 wheeled) Transfers: Sit to/from Stand Sit to Stand: Supervision General transfer comment: verbal cues for hand placement and LE management Ambulation/Gait Ambulation/Gait assistance: Min guard;Supervision Ambulation Distance (Feet): 160 Feet Assistive device: Rolling walker (2 wheeled) Gait Pattern/deviations: Step-to pattern;Step-through pattern General Gait Details: cues for sequence and RW position    Exercises Total Joint Exercises Ankle Circles/Pumps: AROM;Both;10 reps Quad Sets: AROM;Strengthening;Both;10 reps Heel Slides: AAROM;Left;10 reps Straight Leg Raises: AROM;AAROM;Left;10 reps Knee Flexion: AROM;Left;5 reps;Seated Goniometric ROM: 8-101*   PT Diagnosis:     PT Problem List:   PT Treatment Interventions:     PT Goals (current goals can now be found in the care plan section) Acute Rehab PT Goals Patient Stated Goal: return to work PT Goal Formulation: With patient Time For Goal Achievement: 10/30/13  Visit Information  Last PT Received On: 10/24/13 Assistance Needed: +1 History of Present Illness: s/p L TKA    Subjective Data  Subjective: i am doing great Patient Stated Goal: return to work   Cognition  Cognition Arousal/Alertness: Awake/alert Behavior During Therapy: WFL for tasks assessed/performed Overall Cognitive Status: Within Functional Limits for tasks assessed    Balance     End of Session PT - End of Session Activity Tolerance: Patient tolerated treatment well Patient left: in chair;with family/visitor present;with call bell/phone within reach Nurse Communication: Mobility status CPM Left Knee CPM Left Knee: Off   GP     [facility] 10/24/2013, 11:22 AM

## 2013-10-24 NOTE — Progress Notes (Signed)
   Subjective: 1 Day Post-Op Procedure(s) (LRB): LEFT TOTAL KNEE ARTHROPLASTY (Left) Patient reports that he is doing great this morning. Patient seen in rounds with Dr. Wynelle Link. Patient is well, and has had no acute complaints or problems We will start therapy today.  Plan is to go Home after hospital stay.  Objective: Vital signs in last 24 hours: Temp:  [97.4 F (36.3 C)-98.3 F (36.8 C)] 97.8 F (36.6 C) (02/10 0135) Pulse Rate:  [48-88] 63 (02/10 0135) Resp:  [11-18] 17 (02/10 0135) BP: (112-162)/(69-89) 112/70 mmHg (02/10 0135) SpO2:  [97 %-100 %] 99 % (02/10 0135) Weight:  [109.2 kg (240 lb 11.9 oz)] 109.2 kg (240 lb 11.9 oz) (02/09 1240)  Intake/Output from previous day:  Intake/Output Summary (Last 24 hours) at 10/24/13 0723 Last data filed at 10/24/13 0600  Gross per 24 hour  Intake   3850 ml  Output   3760 ml  Net     90 ml    Intake/Output this shift:    Labs:  Recent Labs  10/24/13 0437  HGB 12.7*    Recent Labs  10/24/13 0437  WBC 16.3*  RBC 4.17*  HCT 36.7*  PLT 284    Recent Labs  10/24/13 0437  NA 135*  K 4.3  CL 99  CO2 24  BUN 11  CREATININE 0.80  GLUCOSE 143*  CALCIUM 8.4   No results found for this basename: LABPT, INR,  in the last 72 hours  EXAM General - Patient is Alert, Appropriate and Oriented Extremity - Neurovascular intact Sensation intact distally Dorsiflexion/Plantar flexion intact Dressing - dressing C/D/I Motor Function - intact, moving foot and toes well on exam.  Hemovac pulled without difficulty.  Past Medical History  Diagnosis Date  . High cholesterol   . GERD (gastroesophageal reflux disease)   . Anxiety   . Neuropathy     both feet  . Arthritis     ra  . Lynch syndrome     has gene that causes cancer    Assessment/Plan: 1 Day Post-Op Procedure(s) (LRB): LEFT TOTAL KNEE ARTHROPLASTY (Left) Principal Problem:   OA (osteoarthritis) of knee  Estimated body mass index is 32.64 kg/(m^2) as  calculated from the following:   Height as of this encounter: 6' (1.829 m).   Weight as of this encounter: 109.2 kg (240 lb 11.9 oz). Advance diet Up with therapy Plan for discharge tomorrow Discharge home with home health  DVT Prophylaxis - Xarelto Weight-Bearing as tolerated to left leg D/C O2 and Pulse OX and try on Room Air  Stahl, Armonii Sieh 10/24/2013, 7:23 AM

## 2013-10-25 LAB — CBC
HCT: 33.2 % — ABNORMAL LOW (ref 39.0–52.0)
Hemoglobin: 11.2 g/dL — ABNORMAL LOW (ref 13.0–17.0)
MCH: 30 pg (ref 26.0–34.0)
MCHC: 33.7 g/dL (ref 30.0–36.0)
MCV: 89 fL (ref 78.0–100.0)
PLATELETS: 229 10*3/uL (ref 150–400)
RBC: 3.73 MIL/uL — AB (ref 4.22–5.81)
RDW: 12.2 % (ref 11.5–15.5)
WBC: 13.2 10*3/uL — ABNORMAL HIGH (ref 4.0–10.5)

## 2013-10-25 LAB — BASIC METABOLIC PANEL WITH GFR
BUN: 10 mg/dL (ref 6–23)
CO2: 28 meq/L (ref 19–32)
Calcium: 8.4 mg/dL (ref 8.4–10.5)
Chloride: 98 meq/L (ref 96–112)
Creatinine, Ser: 0.7 mg/dL (ref 0.50–1.35)
GFR calc Af Amer: >90 mL/min
GFR calc non Af Amer: >90 mL/min
Glucose, Bld: 130 mg/dL — ABNORMAL HIGH (ref 70–99)
Potassium: 4 meq/L (ref 3.7–5.3)
Sodium: 135 meq/L — ABNORMAL LOW (ref 137–147)

## 2013-10-25 MED ORDER — TRAMADOL HCL 50 MG PO TABS: 50.0000 mg | ORAL_TABLET | Freq: Four times a day (QID) | ORAL | Status: DC | PRN

## 2013-10-25 MED ORDER — OXYCODONE HCL 5 MG PO TABS: 5.0000 mg | ORAL_TABLET | ORAL | Status: DC | PRN

## 2013-10-25 MED ORDER — RIVAROXABAN 10 MG PO TABS: 10.0000 mg | ORAL_TABLET | Freq: Every day | ORAL | Status: DC

## 2013-10-25 MED ORDER — METHOCARBAMOL 500 MG PO TABS: 500.0000 mg | ORAL_TABLET | Freq: Four times a day (QID) | ORAL | Status: DC | PRN

## 2013-10-25 MED ORDER — OXYCODONE HCL 5 MG PO TABS
5.0000 mg | ORAL_TABLET | ORAL | Status: DC | PRN
  Administered 2013-10-25 (×2): 15 mg via ORAL
  Filled 2013-10-25 (×2): qty 3

## 2013-10-25 NOTE — Discharge Summary (Signed)
Physician Discharge Summary   Patient ID: Craig Scott MRN: [id number] DOB/AGE: 57-Jan-1957 58 y.o.  Admit date: 10/23/2013 Discharge date: 10/25/2013  Primary Diagnosis:  Osteoarthritis Left knee(s)  Admission Diagnoses:  Past Medical History  Diagnosis Date  . High cholesterol   . GERD (gastroesophageal reflux disease)   . Anxiety   . Neuropathy     both feet  . Arthritis     ra  . Lynch syndrome     has gene that causes cancer   Discharge Diagnoses:   Principal Problem:   OA (osteoarthritis) of knee Active Problems:   Hyponatremia  Estimated body mass index is 32.64 kg/(m^2) as calculated from the following:   Height as of this encounter: 6' (1.829 m).   Weight as of this encounter: 109.2 kg (240 lb 11.9 oz).  Procedure:  Procedure(s) (LRB): LEFT TOTAL KNEE ARTHROPLASTY (Left)   Consults: None  HPI: Craig Scott is a 58 y.o. year old male with end stage OA of his left knee with progressively worsening pain and dysfunction. He has constant pain, with activity and at rest and significant functional deficits with difficulties even with ADLs. He has had extensive non-op management including analgesics, injections of cortisone and viscosupplements, and home exercise program, but remains in significant pain with significant dysfunction. Radiographs show bone on bone arthritis medial and aptellofemoral. He presents now for left Total Knee Arthroplasty.   Laboratory Data: Admission on 10/23/2013, Discharged on 10/25/2013  Component Date Value Ref Range Status  . ABO/RH(D) 10/23/2013 O POS   Final  . Antibody Screen 10/23/2013 NEG   Final  . Sample Expiration 10/23/2013 10/26/2013   Final  . WBC 10/24/2013 16.3* 4.0 - 10.5 K/uL Final  . RBC 10/24/2013 4.17* 4.22 - 5.81 MIL/uL Final  . Hemoglobin 10/24/2013 12.7* 13.0 - 17.0 g/dL Final  . HCT 10/24/2013 36.7* 39.0 - 52.0 % Final  . MCV 10/24/2013 88.0  78.0 - 100.0 fL Final  . MCH 10/24/2013 30.5  26.0 - 34.0 pg Final   . MCHC 10/24/2013 34.6  30.0 - 36.0 g/dL Final  . RDW 10/24/2013 12.0  11.5 - 15.5 % Final  . Platelets 10/24/2013 284  150 - 400 K/uL Final  . Sodium 10/24/2013 135* 137 - 147 mEq/L Final  . Potassium 10/24/2013 4.3  3.7 - 5.3 mEq/L Final  . Chloride 10/24/2013 99  96 - 112 mEq/L Final  . CO2 10/24/2013 24  19 - 32 mEq/L Final  . Glucose, Bld 10/24/2013 143* 70 - 99 mg/dL Final  . BUN 10/24/2013 11  6 - 23 mg/dL Final  . Creatinine, Ser 10/24/2013 0.80  0.50 - 1.35 mg/dL Final  . Calcium 10/24/2013 8.4  8.4 - 10.5 mg/dL Final  . GFR calc non Af Amer 10/24/2013 >90  >90 mL/min Final  . GFR calc Af Amer 10/24/2013 >90  >90 mL/min Final   Comment: (NOTE)                          The eGFR has been calculated using the CKD EPI equation.                          This calculation has not been validated in all clinical situations.                          eGFR's persistently <90 mL/min signify possible Chronic  Kidney                          Disease.  . WBC 10/25/2013 13.2* 4.0 - 10.5 K/uL Final  . RBC 10/25/2013 3.73* 4.22 - 5.81 MIL/uL Final  . Hemoglobin 10/25/2013 11.2* 13.0 - 17.0 g/dL Final  . HCT 10/25/2013 33.2* 39.0 - 52.0 % Final  . MCV 10/25/2013 89.0  78.0 - 100.0 fL Final  . MCH 10/25/2013 30.0  26.0 - 34.0 pg Final  . MCHC 10/25/2013 33.7  30.0 - 36.0 g/dL Final  . RDW 10/25/2013 12.2  11.5 - 15.5 % Final  . Platelets 10/25/2013 229  150 - 400 K/uL Final  . Sodium 10/25/2013 135* 137 - 147 mEq/L Final  . Potassium 10/25/2013 4.0  3.7 - 5.3 mEq/L Final  . Chloride 10/25/2013 98  96 - 112 mEq/L Final  . CO2 10/25/2013 28  19 - 32 mEq/L Final  . Glucose, Bld 10/25/2013 130* 70 - 99 mg/dL Final  . BUN 10/25/2013 10  6 - 23 mg/dL Final  . Creatinine, Ser 10/25/2013 0.70  0.50 - 1.35 mg/dL Final  . Calcium 10/25/2013 8.4  8.4 - 10.5 mg/dL Final  . GFR calc non Af Amer 10/25/2013 >90  >90 mL/min Final  . GFR calc Af Amer 10/25/2013 >90  >90 mL/min Final   Comment: (NOTE)                           The eGFR has been calculated using the CKD EPI equation.                          This calculation has not been validated in all clinical situations.                          eGFR's persistently <90 mL/min signify possible Chronic Kidney                          Disease.  Hospital Outpatient Visit on 10/16/2013  Component Date Value Ref Range Status  . aPTT 10/16/2013 30  24 - 37 seconds Final  . WBC 10/16/2013 6.5  4.0 - 10.5 K/uL Final  . RBC 10/16/2013 4.64  4.22 - 5.81 MIL/uL Final  . Hemoglobin 10/16/2013 14.3  13.0 - 17.0 g/dL Final  . HCT 10/16/2013 41.1  39.0 - 52.0 % Final  . MCV 10/16/2013 88.6  78.0 - 100.0 fL Final  . MCH 10/16/2013 30.8  26.0 - 34.0 pg Final  . MCHC 10/16/2013 34.8  30.0 - 36.0 g/dL Final  . RDW 10/16/2013 12.5  11.5 - 15.5 % Final  . Platelets 10/16/2013 254  150 - 400 K/uL Final  . Sodium 10/16/2013 140  137 - 147 mEq/L Final  . Potassium 10/16/2013 4.0  3.7 - 5.3 mEq/L Final  . Chloride 10/16/2013 100  96 - 112 mEq/L Final  . CO2 10/16/2013 25  19 - 32 mEq/L Final  . Glucose, Bld 10/16/2013 105* 70 - 99 mg/dL Final  . BUN 10/16/2013 17  6 - 23 mg/dL Final  . Creatinine, Ser 10/16/2013 0.83  0.50 - 1.35 mg/dL Final  . Calcium 10/16/2013 8.8  8.4 - 10.5 mg/dL Final  . Total Protein 10/16/2013 7.3  6.0 - 8.3 g/dL Final  . Albumin 10/16/2013 4.0  3.5 -  5.2 g/dL Final  . AST 10/16/2013 30  0 - 37 U/L Final  . ALT 10/16/2013 40  0 - 53 U/L Final  . Alkaline Phosphatase 10/16/2013 85  39 - 117 U/L Final  . Total Bilirubin 10/16/2013 0.3  0.3 - 1.2 mg/dL Final  . GFR calc non Af Amer 10/16/2013 >90  >90 mL/min Final  . GFR calc Af Amer 10/16/2013 >90  >90 mL/min Final   Comment: (NOTE)                          The eGFR has been calculated using the CKD EPI equation.                          This calculation has not been validated in all clinical situations.                          eGFR's persistently <90 mL/min signify possible Chronic  Kidney                          Disease.  Marland Kitchen Prothrombin Time 10/16/2013 12.8  11.6 - 15.2 seconds Final  . INR 10/16/2013 0.98  0.00 - 1.49 Final  . Color, Urine 10/16/2013 YELLOW  YELLOW Final  . APPearance 10/16/2013 CLEAR  CLEAR Final  . Specific Gravity, Urine 10/16/2013 1.009  1.005 - 1.030 Final  . pH 10/16/2013 6.5  5.0 - 8.0 Final  . Glucose, UA 10/16/2013 NEGATIVE  NEGATIVE mg/dL Final  . Hgb urine dipstick 10/16/2013 NEGATIVE  NEGATIVE Final  . Bilirubin Urine 10/16/2013 NEGATIVE  NEGATIVE Final  . Ketones, ur 10/16/2013 NEGATIVE  NEGATIVE mg/dL Final  . Protein, ur 10/16/2013 NEGATIVE  NEGATIVE mg/dL Final  . Urobilinogen, UA 10/16/2013 0.2  0.0 - 1.0 mg/dL Final  . Nitrite 10/16/2013 NEGATIVE  NEGATIVE Final  . Leukocytes, UA 10/16/2013 NEGATIVE  NEGATIVE Final   MICROSCOPIC NOT DONE ON URINES WITH NEGATIVE PROTEIN, BLOOD, LEUKOCYTES, NITRITE, OR GLUCOSE <1000 mg/dL.  Marland Kitchen MRSA, PCR 10/16/2013 NEGATIVE  NEGATIVE Final  . Staphylococcus aureus 10/16/2013 NEGATIVE  NEGATIVE Final   Comment:                                 The Xpert SA Assay (FDA                          approved for NASAL specimens                          in patients over 7 years of age),                          is one component of                          a comprehensive surveillance                          program.  Test performance has                          been validated by [employer] [facility] for patients greater                          than or equal to 17 year old.                          It is not intended                          to diagnose infection nor to                          guide or monitor treatment.     X-Rays:No results found.  EKG:No orders found for this or any previous visit.   Hospital Course: Craig Scott is a 58 y.o. who was admitted to [facility]. They were brought to the operating room on 10/23/2013 and underwent Procedure(s): LEFT TOTAL  KNEE ARTHROPLASTY.  Patient tolerated the procedure well and was later transferred to the recovery room and then to the orthopaedic floor for postoperative care.  They were given PO and IV analgesics for pain control following their surgery.  They were given 24 hours of postoperative antibiotics of  Anti-infectives   Start     Dose/Rate Route Frequency Ordered Stop   10/23/13 1530  ceFAZolin (ANCEF) IVPB 2 g/50 mL premix     2 g 100 mL/hr over 30 Minutes Intravenous Every 6 hours 10/23/13 1235 10/23/13 2237   10/23/13 0647  ceFAZolin (ANCEF) IVPB 2 g/50 mL premix     2 g 100 mL/hr over 30 Minutes Intravenous On call to O.R. 10/23/13 4656 10/23/13 0930     and started on DVT prophylaxis in the form of Xarelto.   PT and OT were ordered for total joint protocol.  Discharge planning consulted to help with postop disposition and equipment needs.  Patient had a very good night on the evening of surgery.  They started to get up OOB with therapy on day one. Hemovac drain was pulled without difficulty.  Continued to work with therapy into day two.  Dressing was changed on day two and the incision was healing well.  Patient was seen in rounds and despite having some increase in pain that day, he was ready to go home that afternoon.   Discharge Medications: Prior to Admission medications   Medication Sig Start Date End Date Taking? Authorizing Provider  ALPRAZolam Duanne Moron) 1 MG tablet Take 1 mg by mouth every 6 (six) hours as needed for anxiety. anxiety   Yes Historical Provider, MD  cetirizine (ZYRTEC) 10 MG tablet Take 10 mg by mouth daily.   Yes Historical Provider, MD  docusate sodium (COLACE) 100 MG capsule Take 100 mg by mouth 2 (two) times daily.   Yes Historical Provider, MD  niacin (NIASPAN) 500 MG CR tablet Take 500 mg by mouth every morning.   Yes Historical Provider, MD  omeprazole (PRILOSEC) 20 MG capsule Take 20 mg by mouth daily.   Yes Historical Provider, MD  Potassium 99 MG TABS Take 1  tablet by mouth daily.   Yes Historical Provider, MD  simvastatin (ZOCOR) 20 MG tablet Take 20 mg by mouth every morning.    Yes Historical Provider, MD  zolpidem (AMBIEN) 10 MG tablet Take 10 mg by mouth at bedtime as needed for sleep.   Yes Historical Provider, MD  methocarbamol (ROBAXIN) 500  MG tablet Take 1 tablet (500 mg total) by mouth every 6 (six) hours as needed for muscle spasms. 10/25/13   Alexzandrew Luhmann, PA-C  oxyCODONE (OXY IR/ROXICODONE) 5 MG immediate release tablet Take 1-2 tablets (5-10 mg total) by mouth every 3 (three) hours as needed for moderate pain or severe pain. 10/25/13   Alexzandrew Dara Lords, PA-C  rivaroxaban (XARELTO) 10 MG TABS tablet Take 1 tablet (10 mg total) by mouth daily with breakfast. Take Xarelto for two and a half more weeks, then discontinue Xarelto. Once the patient has completed the Xarelto, they may resume the 81 mg Aspirin. 10/25/13   Alexzandrew Hendrix, PA-C  traMADol (ULTRAM) 50 MG tablet Take 1-2 tablets (50-100 mg total) by mouth every 6 (six) hours as needed (mild to moderate pain). 10/25/13   Alexzandrew Dara Lords, PA-C   Discharge home with home health  Diet - Cardiac diet  Follow up - in 2 weeks  Activity - WBAT  Disposition - Home  Condition Upon Discharge - Good  D/C Meds - See DC Summary  DVT Prophylaxis - Xarelto      Discharge Orders   Future Orders Complete By Expires   Call MD / Call 911  As directed    Comments:     If you experience chest pain or shortness of breath, CALL 911 and be transported to the hospital emergency room.  If you develope a fever above 101 F, pus (white drainage) or increased drainage or redness at the wound, or calf pain, call your surgeon's office.   Change dressing  As directed    Comments:     Change dressing daily with sterile 4 x 4 inch gauze dressing and apply TED hose. Do not submerge the incision under water.   Constipation Prevention  As directed    Comments:     Drink plenty of fluids.  Prune  juice may be helpful.  You may use a stool softener, such as Colace (over the counter) 100 mg twice a day.  Use MiraLax (over the counter) for constipation as needed.   Diet general  As directed    Discharge instructions  As directed    Comments:     Pick up stool softner and laxative for home. Do not submerge incision under water. May shower. Continue to use ice for pain and swelling from surgery.  Take Xarelto for two and a half more weeks, then discontinue Xarelto. Once the patient has completed the Xarelto, they may resume the 81 mg Aspirin.   Do not put a pillow under the knee. Place it under the heel.  As directed    Do not sit on low chairs, stoools or toilet seats, as it may be difficult to get up from low surfaces  As directed    Driving restrictions  As directed    Comments:     No driving until released by the physician.   Increase activity slowly as tolerated  As directed    Lifting restrictions  As directed    Comments:     No lifting until released by the physician.   Patient may shower  As directed    Comments:     You may shower without a dressing once there is no drainage.  Do not wash over the wound.  If drainage remains, do not shower until drainage stops.   TED hose  As directed    Comments:     Use stockings (TED hose) for 3 weeks on both leg(s).  You may remove them at night for sleeping.   Weight bearing as tolerated  As directed    Questions:     Laterality:     Extremity:         Medication List    STOP taking these medications       aspirin EC 81 MG tablet     glucosamine-chondroitin 500-400 MG tablet     HYDROcodone-acetaminophen 10-325 MG per tablet  Commonly known as:  NORCO     lidocaine 5 %  Commonly known as:  LIDODERM     meloxicam 15 MG tablet  Commonly known as:  MOBIC     multivitamin with minerals Tabs tablet     Omega 3 1200 MG Caps      TAKE these medications       ALPRAZolam 1 MG tablet  Commonly known as:  XANAX  Take  1 mg by mouth every 6 (six) hours as needed for anxiety. anxiety     cetirizine 10 MG tablet  Commonly known as:  ZYRTEC  Take 10 mg by mouth daily.     docusate sodium 100 MG capsule  Commonly known as:  COLACE  Take 100 mg by mouth 2 (two) times daily.     methocarbamol 500 MG tablet  Commonly known as:  ROBAXIN  Take 1 tablet (500 mg total) by mouth every 6 (six) hours as needed for muscle spasms.     niacin 500 MG CR tablet  Commonly known as:  NIASPAN  Take 500 mg by mouth every morning.     omeprazole 20 MG capsule  Commonly known as:  PRILOSEC  Take 20 mg by mouth daily.     oxyCODONE 5 MG immediate release tablet  Commonly known as:  Oxy IR/ROXICODONE  Take 1-3 tablets (5-15 mg total) by mouth every 3 (three) hours as needed for moderate pain or severe pain.     Potassium 99 MG Tabs  Take 1 tablet by mouth daily.     rivaroxaban 10 MG Tabs tablet  Commonly known as:  XARELTO  - Take 1 tablet (10 mg total) by mouth daily with breakfast. Take Xarelto for two and a half more weeks, then discontinue Xarelto.  - Once the patient has completed the Xarelto, they may resume the 81 mg Aspirin.     simvastatin 20 MG tablet  Commonly known as:  ZOCOR  Take 20 mg by mouth every morning.     traMADol 50 MG tablet  Commonly known as:  ULTRAM  Take 1-2 tablets (50-100 mg total) by mouth every 6 (six) hours as needed (mild to moderate pain).     zolpidem 10 MG tablet  Commonly known as:  AMBIEN  Take 10 mg by mouth at bedtime as needed for sleep.       Follow-up Information   Follow up with [name], MD. Schedule an appointment as soon as possible for a visit on 11/07/2013.   Specialty:  Orthopedic Surgery   Contact information:   [address] [place] [address] [phone]       Signed: LYNN, SISSEL 11/06/2013, 11:55 AM

## 2013-10-25 NOTE — Progress Notes (Signed)
Occupational Therapy Treatment Patient Details Name: Craig Scott MRN: [id number] DOB: [birth year] Today's Date: 10/25/2013 Time: 8527-7824 OT Time Calculation (min): 36 min  OT Assessment / Plan / Recommendation  History of present illness s/p L TKA   OT comments  Pt with 8/10 pain L LE when OT arrived. Had just received pain meds not long before OT came. Pt unable to do SLR so applied KI for session. Educated/reviewed how to don/doff with pt/wife. Pt practiced with reacher for LB dressing this session and up in room for functional tranfers. Wife can assist PRN at home.   Follow Up Recommendations  No OT follow up;Supervision/Assistance - 24 hour    Barriers to Discharge       Equipment Recommendations  3 in 1 bedside comode;Hospital bed    Recommendations for Other Services    Frequency Min 2X/week   Progress towards OT Goals Progress towards OT goals: Progressing toward goals  Plan Discharge plan remains appropriate    Precautions / Restrictions Precautions Precautions: Fall;Knee Required Braces or Orthoses: Knee Immobilizer - Left Knee Immobilizer - Left: Discontinue once straight leg raise with < 10 degree lag Restrictions Weight Bearing Restrictions: No Other Position/Activity Restrictions: WBAT   Pertinent Vitals/Pain 8/10 during session 6/10 resting after session Reposition, ice    ADL  Toilet Transfer: Performed;Min guard Toilet Transfer Method:  (standing for urinal.) Equipment Used: Rolling walker;Knee Immobilizer ADL Comments: Pt states he feels comfortable with how to use 3in1 but reviewed how to adjust 3in1 for appropriate height and pt transferred into recliner with min guard assist. Pt interested in AE but thinks he may only obtain the reacher for retrieving items and maybe to use for donning pants. HE states wife can also assist with LB self care so he doesnt think he will need the whole AE kit. Did discuss uses of reacher including donning and doffing  pants and pt practiced with doffing pants as well as socks with the reacher.     OT Diagnosis:    OT Problem List:   OT Treatment Interventions:     OT Goals(current goals can now be found in the care plan section)    Visit Information  Last OT Received On: 10/25/13 Assistance Needed: +1 History of Present Illness: s/p L TKA    Subjective Data      Prior Functioning       Cognition  Cognition Arousal/Alertness: Awake/alert Behavior During Therapy: WFL for tasks assessed/performed Overall Cognitive Status: Within Functional Limits for tasks assessed    Mobility  Bed Mobility Overal bed mobility: Needs Assistance Bed Mobility: Supine to Sit Supine to sit: Min assist General bed mobility comments: min assist for L LE over to EOB. pt with pain Transfers Overall transfer level: Needs assistance Equipment used: Rolling walker (2 wheeled) Transfers: Sit to/from Stand Sit to Stand: Min guard General transfer comment: pt did well with hand placement today    Exercises      Balance    End of Session OT - End of Session Equipment Utilized During Treatment: Rolling walker;Left knee immobilizer Activity Tolerance: Patient limited by pain Patient left: in chair;with call bell/phone within reach;with family/visitor present  Craig Scott, Craig Scott [phone] 10/25/2013, 9:38 AM

## 2013-10-25 NOTE — Progress Notes (Signed)
Physical Therapy Treatment Patient Details Name: Craig Scott MRN: [id number] DOB: 06/26/1956 Today's Date: 10/25/2013 Time: 8341-9622 PT Time Calculation (min): 26 min  PT Assessment / Plan / Recommendation  History of Present Illness s/p L TKA   PT Comments   Pt reports experiencing more pain today. Pt will need to practice steps again, PT wants to try RW backward. Pt is skeptical. Plans DC today.  Follow Up Recommendations  Home health PT     Does the patient have the potential to tolerate intense rehabilitation     Barriers to Discharge Inaccessible home environment      Equipment Recommendations  Rolling walker with 5" wheels;3in1 (PT);Crutches (getting a hospital bed)    Recommendations for Other Services    Frequency 7X/week   Progress towards PT Goals Progress towards PT goals: Progressing toward goals  Plan Current plan remains appropriate    Precautions / Restrictions Precautions Precautions: Fall;Knee Required Braces or Orthoses: Knee Immobilizer - Left Knee Immobilizer - Left: Discontinue once straight leg raise with < 10 degree lag Restrictions Weight Bearing Restrictions: No Other Position/Activity Restrictions: WBAT   Pertinent Vitals/Pain >8 L knee, felt better with KI in place.    Mobility  Bed Mobility Overal bed mobility: Needs Assistance Bed Mobility: Sit to Supine Supine to sit: Min assist General bed mobility comments: min assist for L LE  onto bed. Transfers Overall transfer level: Needs assistance Equipment used: Rolling walker (2 wheeled) Transfers: Sit to/from Stand Sit to Stand: Min assist General transfer comment: cues for safety with crutches. Ambulation/Gait Ambulation/Gait assistance: Min assist Ambulation Distance (Feet): 20 Feet Gait Pattern/deviations: Step-to pattern;Antalgic General Gait Details: cues for sequence with crutches position Stairs: Yes Stairs assistance: Mod assist Stair Management: No rails;Forwards;With  crutches Number of Stairs: 2 General stair comments: frequent cues for sequence and safety , pt practiced x 2. wife present. Pt expresses feeling unsteady on steps. Will practice using RW if pt will consent.     Exercises     PT Diagnosis: Difficulty walking  PT Problem List:   PT Treatment Interventions:     PT Goals (current goals can now be found in the care plan section)    Visit Information  Last PT Received On: 10/25/13 Assistance Needed: +1 History of Present Illness: s/p L TKA    Subjective Data      Cognition  Cognition Arousal/Alertness: Awake/alert Behavior During Therapy: WFL for tasks assessed/performed Overall Cognitive Status: Within Functional Limits for tasks assessed    Balance  Balance Overall balance assessment: Needs assistance Standing balance support: Bilateral upper extremity supported Standing balance-Leahy Scale: Fair Standing balance comment: on crutches.  End of Session PT - End of Session Equipment Utilized During Treatment: Gait belt Activity Tolerance: Patient limited by pain Patient left: in bed;with call bell/phone within reach;with family/visitor present Nurse Communication: Mobility status   GP     Claretha Cooper 10/25/2013, 11:30 AM Tresa Endo PT [phone]

## 2013-10-25 NOTE — Progress Notes (Signed)
Physical Therapy Treatment Patient Details Name: Craig Scott MRN: [id number] DOB: November 30, 1955 Today's Date: 10/25/2013 Time: 9753-0051 PT Time Calculation (min): 27 min  PT Assessment / Plan / Recommendation  History of Present Illness s/p L TKA   PT Comments   POD # 2 pm session.  Pt performed steps this am poorly with B crutches so pm session demon/instructed pt on 2 steps backward with RW.  Pt/spouse instructed on KI use and proper application.     Follow Up Recommendations  Home health PT     Does the patient have the potential to tolerate intense rehabilitation     Barriers to Discharge Inaccessible home environment      Equipment Recommendations  Rolling walker with 5" wheels;3in1 (PT);Crutches  HOSPITAL BED  Recommendations for Other Services    Frequency 7X/week   Progress towards PT Goals Progress towards PT goals: Progressing toward goals  Plan Current plan remains appropriate    Precautions / Restrictions Precautions Precautions: Fall;Knee Precaution Comments: Instructed pt on KI use for amb and steps Required Braces or Orthoses: Knee Immobilizer - Left Knee Immobilizer - Left: Discontinue once straight leg raise with < 10 degree lag Restrictions Weight Bearing Restrictions: No   Pertinent Vitals/Pain C/o 5/10 ICE applied    Mobility  Bed Mobility Overal bed mobility: Needs Assistance Bed Mobility: Supine to Sit;Sit to Supine Supine to sit: Min guard Sit to supine: Min guard General bed mobility comments: assist L LE off/bed Transfers Overall transfer level: Needs assistance Equipment used: Rolling walker (2 wheeled) Transfers: Sit to/from Stand Sit to Stand: Supervision;Min guard General transfer comment: one VC on safety with turn completion Ambulation/Gait Ambulation/Gait assistance: Supervision;Min guard Ambulation Distance (Feet): 85 Feet Assistive device: Rolling walker (2 wheeled) Gait Pattern/deviations: Step-to pattern;Decreased stance  time - left Gait velocity: decreased General Gait Details: increased time and 25% VC's on energy conservation tech and to decrease constant WB thru B UE's on RW.  Stairs: Yes Stairs assistance: Min assist Stair Management: No rails;Backwards;With walker Number of Stairs: 2 General stair comments: with spouse present practiced going up 2 steps backward with RW.  Instructed on KI use and proper placement on LE.      PT Goals (current goals can now be found in the care plan section)    Visit Information  Last PT Received On: 10/25/13 Assistance Needed: +1 History of Present Illness: s/p L TKA    Subjective Data      Cognition  Cognition Arousal/Alertness: Awake/alert Behavior During Therapy: WFL for tasks assessed/performed Overall Cognitive Status: Within Functional Limits for tasks assessed    Balance  Balance Overall balance assessment: Needs assistance Standing balance support: Bilateral upper extremity supported Standing balance-Leahy Scale: Fair Standing balance comment: on crutches.  End of Session PT - End of Session Equipment Utilized During Treatment: Gait belt;Left knee immobilizer Activity Tolerance: Patient tolerated treatment well Patient left: in bed;with call bell/phone within reach;with family/visitor present Nurse Communication:  (pt ready for D/C after equipment issue resolved)   Rica Koyanagi  PTA [facility]  Acute  Rehab Pager      [phone]

## 2013-10-25 NOTE — Progress Notes (Signed)
   Subjective: 2 Days Post-Op Procedure(s) (LRB): LEFT TOTAL KNEE ARTHROPLASTY (Left) Patient reports pain as mild and moderate.   Patient seen in rounds with Dr. Wynelle Link. Patient is well, but has had some minor complaints of pain in the knee, requiring pain medications Patient is ready to go home  Objective: Vital signs in last 24 hours: Temp:  [97.3 F (36.3 C)-98.4 F (36.9 C)] 98.4 F (36.9 C) (02/11 0530) Pulse Rate:  [66-88] 88 (02/11 0530) Resp:  [15-16] 16 (02/11 0530) BP: (145-156)/(80-93) 156/93 mmHg (02/11 0530) SpO2:  [97 %-100 %] 97 % (02/11 0530)  Intake/Output from previous day:  Intake/Output Summary (Last 24 hours) at 10/25/13 0814 Last data filed at 10/25/13 0515  Gross per 24 hour  Intake    480 ml  Output   4000 ml  Net  -3520 ml    Intake/Output this shift:    Labs:  Recent Labs  10/24/13 0437 10/25/13 0440  HGB 12.7* 11.2*    Recent Labs  10/24/13 0437 10/25/13 0440  WBC 16.3* 13.2*  RBC 4.17* 3.73*  HCT 36.7* 33.2*  PLT 284 229    Recent Labs  10/24/13 0437 10/25/13 0440  NA 135* 135*  K 4.3 4.0  CL 99 98  CO2 24 28  BUN 11 10  CREATININE 0.80 0.70  GLUCOSE 143* 130*  CALCIUM 8.4 8.4   No results found for this basename: LABPT, INR,  in the last 72 hours  EXAM: General - Patient is Alert and Appropriate Extremity - Neurovascular intact Sensation intact distally Dorsiflexion/Plantar flexion intact Incision - clean, dry, no drainage Motor Function - intact, moving foot and toes well on exam.   Assessment/Plan: 2 Days Post-Op Procedure(s) (LRB): LEFT TOTAL KNEE ARTHROPLASTY (Left) Procedure(s) (LRB): LEFT TOTAL KNEE ARTHROPLASTY (Left) Past Medical History  Diagnosis Date  . High cholesterol   . GERD (gastroesophageal reflux disease)   . Anxiety   . Neuropathy     both feet  . Arthritis     ra  . Lynch syndrome     has gene that causes cancer   Principal Problem:   OA (osteoarthritis) of knee Active  Problems:   Hyponatremia  Estimated body mass index is 32.64 kg/(m^2) as calculated from the following:   Height as of this encounter: 6' (1.829 m).   Weight as of this encounter: 109.2 kg (240 lb 11.9 oz). Discharge home with home health Diet - Cardiac diet Follow up - in 2 weeks Activity - WBAT Disposition - Home Condition Upon Discharge - Good D/C Meds - See DC Summary DVT Prophylaxis - Xarelto  Spradley, Terrill Alperin 10/25/2013, 8:14 AM

## 2013-11-13 ENCOUNTER — Ambulatory Visit (HOSPITAL_COMMUNITY): Payer: Self-pay | Admitting: Physical Therapy

## 2014-11-14 ENCOUNTER — Ambulatory Visit (HOSPITAL_COMMUNITY): Payer: BC Managed Care – PPO | Attending: Orthopedic Surgery | Admitting: Physical Therapy

## 2014-11-14 ENCOUNTER — Encounter (HOSPITAL_COMMUNITY): Payer: Self-pay | Admitting: Physical Therapy

## 2014-11-14 DIAGNOSIS — M6281 Muscle weakness (generalized): Secondary | ICD-10-CM | POA: Diagnosis not present

## 2014-11-14 DIAGNOSIS — Z7409 Other reduced mobility: Secondary | ICD-10-CM | POA: Diagnosis not present

## 2014-11-14 DIAGNOSIS — R262 Difficulty in walking, not elsewhere classified: Secondary | ICD-10-CM

## 2014-11-14 DIAGNOSIS — R531 Weakness: Secondary | ICD-10-CM

## 2014-11-14 DIAGNOSIS — M25571 Pain in right ankle and joints of right foot: Secondary | ICD-10-CM

## 2014-11-14 NOTE — Therapy (Signed)
[place], [place], [address] Phone: [phone]   Fax:  [phone]  Physical Therapy Evaluation  Patient Details  Name: Craig Scott MRN: [id number] Date of Birth: [birth year] Referring Provider:  Wylene Simmer, MD  Encounter Date: 11/14/2014      PT End of Session - 11/14/14 1657    Visit Number 1   Number of Visits 8   Date for PT Re-Evaluation 12/05/14   Authorization Type [employer]   Authorization Time Period 11/14/14 to 01/14/15   Activity Tolerance Patient tolerated treatment well   Behavior During Therapy [facility] for tasks assessed/performed      Past Medical History  Diagnosis Date  . High cholesterol   . GERD (gastroesophageal reflux disease)   . Anxiety   . Neuropathy     both feet  . Arthritis     ra  . Lynch syndrome     has gene that causes cancer    Past Surgical History  Procedure Laterality Date  . Knee surgery Right     X2  . Colonoscopy   12/09/01    RMR: Normal normal rectum/ A few scattered left-sided diverticula.  The remainder of the colonic  mucosa appeared normal  . Colonoscopy  12/15/2002    RMR: Normal rectum, scattered left-sided diverticula.  The remainder of the colonic mucosa appeared normal  . Colonoscopy  04/10/2004    RMR: Normal rectum/Sigmoid diverticula/The remainder of the colonic mucosa appeared normal  . Esophagogastroduodenoscopy  12/23/2005    RMR:   Esophagogastric peptic stricture with reflux esophagitis, status post  dilation as described above/ Small hiatal hernia, otherwise normal stomach.  Bulbar erosions otherwise normal D1 and D2  . Colonoscopy  12/23/2005    RMR: Normal rectum, scattered sigmoid diverticula Colonic mucosa appeared normal  . Colonoscopy    01/20/2008    RMR: Distal diminutive rectal polyps, status post cold biopsy removal  otherwise, normal rectum/ biopsy removal; scattered sigmoid diverticula; and benign-appearing/ulcers about the  ileocecal valve, status post biopsy.  Remainder of colonic mucosa and terminal mucosa appeared normal.  . Colonoscopy  05/22/2010    RMR: distal diminutive rectal polyp s/p bx otherwise normal/few scattered pancolonic diverticula  . Nose surgery    . Colonoscopy  09/16/2012    Procedure: COLONOSCOPY;  Surgeon: Daneil Dolin, MD;  Location: [facility];  Service: Endoscopy;  Laterality: N/A;  7:30  . Eye surgery  as child  . Tympanostomy tube placement Right     early 2014  . Joint replacement  09/30/2010    Right knee  . Total knee arthroplasty Left 10/23/2013    Procedure: LEFT TOTAL KNEE ARTHROPLASTY;  Surgeon: [name], MD;  Location: [facility] ORS;  Service: Orthopedics;  Laterality: Left;    There were no vitals taken for this visit.  Visit Diagnosis:  Pain in joint, ankle and foot, right - Plan: PT plan of care cert/re-cert  Difficulty walking due to ankle and foot joint - Plan: PT plan of care cert/re-cert  Weakness generalized - Plan: PT plan of care cert/re-cert  Decreased mobility and endurance - Plan: PT plan of care cert/re-cert      Subjective Assessment - 11/14/14 1628    Symptoms Patient describes typical day with ankle as "bad, hell"; leg is on lateral side of foot. Can't wear shoes anymore. Numb, tingling, "feels like trying to pull toenails off".    Pertinent History patient had both knees replaced, after which  he began having pain in his ankle   How long can you sit comfortably? 45 minutes   How long can you stand comfortably? 30 to 40 minutes   How long can you walk comfortably? 5 to 10 minutes   Patient Stated Goals to avoid surgery   Currently in Pain? Yes   Pain Score 8    Pain Location Ankle   Pain Orientation Right   Pain Descriptors / Indicators Shooting;Burning;Stabbing   Pain Frequency Constant   Aggravating Factors  standing and walking, mashing gas pedal   Pain Relieving Factors drinking; doing activities patient enjoys          [facility] PT  Assessment - 11/14/14 0001    Assessment   Medical Diagnosis R posterior tibialis tendonitis and R hammer toes   Balance Screen   Has the patient fallen in the past 6 months No   Has the patient had a decrease in activity level because of a fear of falling?  Yes   Is the patient reluctant to leave their home because of a fear of falling?  No   Observation/Other Assessments   Focus on Therapeutic Outcomes (FOTO)  72% limited   Sensation   Additional Comments Numbness, burning in R foot   Posture/Postural Control   Posture Comments Signficantly limited spine and hip rotation noted, wide base of support, deformation R ankle noted with fallen arch and valgus ankle, edema noted thorughout foot   AROM   Right Ankle Dorsiflexion 12   Right Ankle Plantar Flexion 30   Left Ankle Dorsiflexion 14   Left Ankle Plantar Flexion 45   Strength   Right Hip Flexion 4/5   Right Hip Extension 2/5   Right Hip ABduction 3+/5   Left Hip Flexion 3/5   Left Hip Extension 2/5   Left Hip ABduction 2/5   Right Knee Flexion 4-/5   Right Knee Extension 4/5   Left Knee Flexion 4-/5   Left Knee Extension 4/5   Right Ankle Dorsiflexion 4+/5   Left Ankle Dorsiflexion 4-/5   Palpation   Palpation Tender to palpation along medial ankle, very tender along path of posterior tibialis tendon R R ankle, some mild tenderness bottom of R foot; very rigid joints in mid foot and calcaneous   Ambulation/Gait   Gait Comments Very rigid hips and spine, reduced step length, fallen arches especially on R, reduced gait speed, limited calcaneal motion, limited pushoff during gait, reduced ankle dorsiflexion                          PT Education - 11/14/14 1657    Education provided Yes   Education Details Prognosis; general education regarding posterior tibialis tendonitis; HEP; PT plan of care moving forward;   Person(s) Educated Patient   Methods Explanation   Comprehension Verbalized  understanding;Returned demonstration          PT Short Term Goals - 11/14/14 1644    PT SHORT TERM GOAL #1   Title Patient will demonstrate the ability to sit for at least 2 hours with pain no more than 4/10 R ankle/foot in order to enhance ability to participate in functional seated tasks   PT SHORT TERM GOAL #2   Title Patient will demonstrate the ability to stand for 75 minutes and to ambulate for at least 30 minutes with pain no more than 4/10 R ankle/foot  in order to improve functional participation in work tasks   Time  4   Period Weeks   Status New   PT SHORT TERM GOAL #3   Title Patient will demonstrate improved ankle dorsiflexion to at least 18 degrees bilaterally; also R ankle plantar flexion to at least 45 degrees   Time 4   Period Weeks   Status New   PT SHORT TERM GOAL #4   Title Patient will demonstrate ability to independently manage appropriate HEP in order to assist in treating his condition and prevent recurrence of pain   Time 4   Period Weeks   Status New   PT SHORT TERM GOAL #5   Title Patient will be able to ambulate from his bedroom to the shower in his house without holding onto walls or furniture walking to maintain stability   Time 4   Period Weeks   Status New   Additional Short Term Goals   Additional Short Term Goals Yes   PT SHORT TERM GOAL #6   Title Patient will demonstrate general hip strength of at least 4/5 in order to improve overall stability, improve gait mechanics, and reduce pain in R ankle/foot   Time 4   Period Weeks   Status New           PT Long Term Goals - 11/14/14 1647    PT LONG TERM GOAL #1   Title Patient will demonstrate the ability to independently manage advanced HEP in order to assist in managing condition and reduce chances of recurrence of pain   Time 8   Period Weeks   Status New   PT LONG TERM GOAL #2   Title Patient will demonstrate ability to perform sitting tasks for 4 consecutive hours in order to enhance  ability to perform work tasks and allow for long drives, pain R ankle/foot no more than 2/10   Time 8   Period Weeks   Status New   PT LONG TERM GOAL #3   Title Patient will demonstrate the ability to perform standing tasks for at least 3 consecutive hours and will be able to ambulate for at least 60 consecutive minutes with pain R ankle/foot no more than 2/10   Time 8   Period Weeks   Status New   PT LONG TERM GOAL #4   Title Patient will demonstrate the ability to consistently ambulate with improved gait mechanics on stable and unstable surfaces and including but not limited to improved step length, gait speed, toe off mechanics, improved calcaneal mobility, and good balance reaction skills   Time 8   Period Weeks   Status New               Plan - 11/14/14 1657    Clinical Impression Statement Patient presents with severe gait limitations due to R ankle posterior tibialis tendonitis and hammer toes as well as reduced general joint mobilty and stiffness, generalized weakness, significantly impaired gait mechanics and speed, severe pain R leg in general, and reduced functional activity tolerance due to pain. Patiient will benefit from skilled PT services in order to improve mobility and motion within R foot and ankle, improve strength, gait mechanics, activity tolerance, reduce pain, and assist patient in returning to functional work and leisure tasks with minimal pain.    Pt will benefit from skilled therapeutic intervention in order to improve on the following deficits Abnormal gait;Decreased endurance;Improper body mechanics;Decreased activity tolerance;Decreased strength;Impaired flexibility;Postural dysfunction;Decreased balance;Decreased mobility;Difficulty walking;Impaired sensation;Decreased range of motion;Increased edema;Pain;Decreased coordination   Rehab Potential Good   Clinical Impairments  Affecting Rehab Potential severe hip pain   PT Frequency 1x / week  requests x1/week  due to high co-pay of $52/session   PT Duration 8 weeks   PT Treatment/Interventions ADLs/Self Care Home Management;Gait training;Neuromuscular re-education;Ultrasound;Stair training;Functional mobility training;Patient/family education;Passive range of motion;Cryotherapy;Therapeutic activities;Electrical Stimulation;Therapeutic exercise;Manual techniques;DME Instruction;Balance training   PT Next Visit Plan Functional ankle strengthening as tolerated and including eccentric activities for R posterior tibialis; functional stretching; manual mobilization of R foot, soft tissue mobilization if appropriate/tolerated especially to plantar fascia R foot. Assess and update HEP if appropriate.    PT Home Exercise Plan given- assess and update weekly   Consulted and Agree with Plan of Care Patient         Problem List Patient Active Problem List   Diagnosis Date Noted  . Hyponatremia 10/24/2013  . OA (osteoarthritis) of knee 10/23/2013  . Hepatomegaly 09/01/2012  . Lynch syndrome 09/01/2012  . GERD (gastroesophageal reflux disease) 09/01/2012  . Knee pain 08/12/2011    Deniece Ree PT, DPT [phone]  [place] [address] [place], [place], [address] Phone: [phone]   Fax:  [phone]

## 2014-11-14 NOTE — Patient Instructions (Signed)
Ankle Bend (Dorsiflexion and Plantar Flexion)   Sitting or lying down, point toes up, keeping both heels on floor. Then press toes to floor, raising heels. Repeat __10__ times. Do __2__ sessions per day.  http://gt2.exer.us/403   Copyright  VHI. All rights reserved.   Ankle Alphabet   Using left ankle and foot only, trace the letters of the alphabet. Perform A to Z. Repeat _1___ times per set. Do __1__ sets per session. Do __2__ sessions per day.  http://orth.exer.us/16   Copyright  VHI. All rights reserved.  Ankle Circles   Slowly rotate right foot and ankle clockwise then counterclockwise. Gradually increase range of motion. Avoid pain. Circle _5___ times clockwise, 5 times counterclockwise per set. Do __1__ sets per session. Do __2__ sessions per day.  http://orth.exer.us/30   Copyright  VHI. All rights reserved.  Ankle Eversion: Long-Sitting   Loop tubing around feet just below toes, legs separated as far as tolerated, toes pointed inward. Rotate ankles, pointing toes outward. Repeat _10_ times per set. Do _1_ sets per session. Do _2_ sessions per day.  http://tub.exer.us/217   Copyright  VHI. All rights reserved.  ANKLE: Inversion, Unilateral   Sit at edge of surface, feet on floor. Raise toes of foot up and move toward body. Do not move hip. __10_ reps per set, _2__ sets per day.ABDUCTION: Standing (Active)   Stand, feet flat. Lift right leg out to side. Use __0_ lbs. Complete __1_ sets of _10__ repetitions. Perform _2__ sessions per day.  http://gtsc.exer.us/110   Copyright  VHI. All rights reserved.  (Home) Extension: Hip   With support under abdomen, tighten stomach. Lift right leg in line with body. Do not hyperextend. Alternate legs. Repeat __10__ times per set. Do __1__ sets per session. Do __2__ sessions per day.  Copyright  VHI. All rights reserved.    Copyright  VHI. All rights reserved.     At least once daily: take frozen water  bottle and place under foot (sock on); gently press down on bottle and roll back and forth to help alleviate pain on bottom of foot.

## 2014-11-27 ENCOUNTER — Encounter (HOSPITAL_COMMUNITY): Payer: Self-pay | Admitting: Physical Therapy

## 2014-11-29 ENCOUNTER — Ambulatory Visit (HOSPITAL_COMMUNITY): Payer: BC Managed Care – PPO | Admitting: Physical Therapy

## 2014-11-29 DIAGNOSIS — Z7409 Other reduced mobility: Secondary | ICD-10-CM

## 2014-11-29 DIAGNOSIS — R531 Weakness: Secondary | ICD-10-CM

## 2014-11-29 DIAGNOSIS — M25571 Pain in right ankle and joints of right foot: Secondary | ICD-10-CM | POA: Diagnosis not present

## 2014-11-29 DIAGNOSIS — R262 Difficulty in walking, not elsewhere classified: Secondary | ICD-10-CM

## 2014-11-29 NOTE — Therapy (Signed)
[place], [place], [address] Phone: [phone]   Fax:  [phone]  Physical Therapy Treatment  Patient Details  Name: Craig Scott MRN: [id number] Date of Birth: Oct 15, 1955 Referring Provider:  Elsie Lincoln, MD  Encounter Date: 11/29/2014      PT End of Session - 11/29/14 1630    Visit Number 1   Number of Visits 8   Date for PT Re-Evaluation 12/05/14   Authorization Type BCBS   Authorization Time Period 11/14/14 to 01/14/15   PT Start Time 1525   PT Stop Time 1620   PT Time Calculation (min) 55 min   Activity Tolerance Patient tolerated treatment well   Behavior During Therapy [facility] for tasks assessed/performed      Past Medical History  Diagnosis Date  . High cholesterol   . GERD (gastroesophageal reflux disease)   . Anxiety   . Neuropathy     both feet  . Arthritis     ra  . Lynch syndrome     has gene that causes cancer    Past Surgical History  Procedure Laterality Date  . Knee surgery Right     X2  . Colonoscopy   12/09/01    RMR: Normal normal rectum/ A few scattered left-sided diverticula.  The remainder of the colonic  mucosa appeared normal  . Colonoscopy  12/15/2002    RMR: Normal rectum, scattered left-sided diverticula.  The remainder of the colonic mucosa appeared normal  . Colonoscopy  04/10/2004    RMR: Normal rectum/Sigmoid diverticula/The remainder of the colonic mucosa appeared normal  . Esophagogastroduodenoscopy  12/23/2005    RMR:   Esophagogastric peptic stricture with reflux esophagitis, status post  dilation as described above/ Small hiatal hernia, otherwise normal stomach.  Bulbar erosions otherwise normal D1 and D2  . Colonoscopy  12/23/2005    RMR: Normal rectum, scattered sigmoid diverticula Colonic mucosa appeared normal  . Colonoscopy    01/20/2008    RMR: Distal diminutive rectal polyps, status post cold biopsy removal  otherwise, normal rectum/ biopsy  removal; scattered sigmoid diverticula; and benign-appearing/ulcers about the ileocecal valve, status post biopsy.  Remainder of colonic mucosa and terminal mucosa appeared normal.  . Colonoscopy  05/22/2010    RMR: distal diminutive rectal polyp s/p bx otherwise normal/few scattered pancolonic diverticula  . Nose surgery    . Colonoscopy  09/16/2012    Procedure: COLONOSCOPY;  Surgeon: Daneil Dolin, MD;  [facility];  Service: Endoscopy;  Laterality: N/A;  7:30  . Eye surgery  as child  . Tympanostomy tube placement Right     early 2014  . Joint replacement  09/30/2010    Right knee  . Total knee arthroplasty Left 10/23/2013    Procedure: LEFT TOTAL KNEE ARTHROPLASTY;  Surgeon: [name], MD;  [facility]: [facility];  Service: Orthopedics;  Laterality: Left;    There were no vitals filed for this visit.  Visit Diagnosis:  No diagnosis found.      Subjective Assessment - 11/29/14 1638    Symptoms Pt states his pain is 10/10 in his Rt ankle.  States he's just getting off work.  Comes today with ALSO in place and wearing work boots. States Dr. Maureen Ralphs wants to do a THR asap.              [facility] Adult PT Treatment/Exercise - 11/29/14 1626    Manual Therapy   Manual Therapy Joint mobilization;Massage;Passive ROM  Joint Mobilization to increase inversion and extension of second and third digits   Massage to Plantar fascia and medial arch   Passive ROM to Rt ankle and toes   Ankle Exercises: Stretches   Plantar Fascia Stretch 3 reps;30 seconds   Gastroc Stretch 3 reps;30 seconds   Gastroc Stretch Limitations slant board   Ankle Exercises: Seated   Towel Crunch Limitations   Towel Crunch Limitations 2 minutes   Towel Inversion/Eversion 5 reps   Towel Inversion/Eversion Weights (lbs) inversion only   Heel Raises 10 reps                  PT Short Term Goals - 11/14/14 1644    PT SHORT TERM GOAL #1   Title Patient will demonstrate the ability to sit for at  least 2 hours with pain no more than 4/10 R ankle/foot in order to enhance ability to participate in functional seated tasks   PT SHORT TERM GOAL #2   Title Patient will demonstrate the ability to stand for 75 minutes and to ambulate for at least 30 minutes with pain no more than 4/10 R ankle/foot  in order to improve functional participation in work tasks   Time 4   Period Weeks   Status New   PT [place] #3   Title Patient will demonstrate improved ankle dorsiflexion to at least 18 degrees bilaterally; also R ankle plantar flexion to at least 45 degrees   Time 4   Period Weeks   Status New   PT SHORT TERM GOAL #4   Title Patient will demonstrate ability to independently manage appropriate HEP in order to assist in treating his condition and prevent recurrence of pain   Time 4   Period Weeks   Status New   PT SHORT TERM GOAL #5   Title Patient will be able to ambulate from his bedroom to the shower in his house without holding onto walls or furniture walking to maintain stability   Time 4   Period Weeks   Status New   Additional Short Term Goals   Additional Short Term Goals Yes   PT SHORT TERM GOAL #6   Title Patient will demonstrate general hip strength of at least 4/5 in order to improve overall stability, improve gait mechanics, and reduce pain in R ankle/foot   Time 4   Period Weeks   Status New           PT Long Term Goals - 11/14/14 1647    PT LONG TERM GOAL #1   Title Patient will demonstrate the ability to independently manage advanced HEP in order to assist in managing condition and reduce chances of recurrence of pain   Time 8   Period Weeks   Status New   PT LONG TERM GOAL #2   Title Patient will demonstrate ability to perform sitting tasks for 4 consecutive hours in order to enhance ability to perform work tasks and allow for long drives, pain R ankle/foot no more than 2/10   Time 8   Period Weeks   Status New   PT LONG TERM GOAL #3   Title Patient  will demonstrate the ability to perform standing tasks for at least 3 consecutive hours and will be able to ambulate for at least 60 consecutive minutes with pain R ankle/foot no more than 2/10   Time 8   Period Weeks   Status New   PT LONG TERM GOAL #4  Title Patient will demonstrate the ability to consistently ambulate with improved gait mechanics on stable and unstable surfaces and including but not limited to improved step length, gait speed, toe off mechanics, improved calcaneal mobility, and good balance reaction skills   Time 8   Period Weeks   Status New               Plan - 11/29/14 1638    Clinical Impression Statement Instructed patient with standing plantar fascia and gastroc stretches. Added towel inversion and toe pick up to strengthen ankle inverters and toe extensors. Attempted standing heel raises, however too painful. Will need to begin with seated calf therex and progress to weight bearing to avoid pain. Pt given writeen instructeions to add to HEP. manual completed focusing on increasing ankle inversion and toe extension as well as decreasing pain. Pt reported reduction of pain to 5/10 at end of session   PT Frequency --  requests x1/week due to high co-pay of $52/session   PT Next Visit Plan Begin eccentric calf raises on cybex machine in seated and theraband exercises to strethen ankle.  Progress to Functional ankle strengthening as tolerated and including eccentric activities for R posterior tibialis; functional stretching; manual mobilization of R foot, soft tissue mobilization if appropriate/tolerated especially to plantar fascia R foot. Assess and update HEP if appropriate.    PT Home Exercise Plan Added towel inversion and toe pick up-  Update weekly as patient will do majority as HEP.   Consulted and Agree with Plan of Care Patient        Problem List Patient Active Problem List   Diagnosis Date Noted  . Hyponatremia 10/24/2013  . OA (osteoarthritis) of  knee 10/23/2013  . Hepatomegaly 09/01/2012  . Lynch syndrome 09/01/2012  . GERD (gastroesophageal reflux disease) 09/01/2012  . Knee pain 08/12/2011    Teena Irani, PTA/CLT [phone] 11/29/2014, 4:42 PM  [facility] [address] [place], [place], [address] Phone: [phone]   Fax:  [phone]

## 2014-11-29 NOTE — Patient Instructions (Signed)
Towel Slide   Sitting, use strong sideward motion with one foot to slide a towel. Smooth towel back out. Repeat with other foot. Repeat _10___ times. Do ___2_ sessions per day.  http://gt2.exer.us/413   Copyright  VHI. All rights reserved.  Toe Pick-Up   Use toes of left foot to pick up objects from floor, such as coins, marbles, towel __10______, ______times__, or _____2 minutes___ Repeat ___2_ times per set. Do __2__ sessions per day.  http://orth.exer.us/48   Copyright  VHI. All rights reserved.

## 2014-12-03 ENCOUNTER — Ambulatory Visit (HOSPITAL_COMMUNITY): Payer: BC Managed Care – PPO | Admitting: Physical Therapy

## 2014-12-03 DIAGNOSIS — R531 Weakness: Secondary | ICD-10-CM

## 2014-12-03 DIAGNOSIS — R262 Difficulty in walking, not elsewhere classified: Secondary | ICD-10-CM

## 2014-12-03 DIAGNOSIS — Z7409 Other reduced mobility: Secondary | ICD-10-CM

## 2014-12-03 DIAGNOSIS — M25571 Pain in right ankle and joints of right foot: Secondary | ICD-10-CM | POA: Diagnosis not present

## 2014-12-03 NOTE — Therapy (Signed)
[place], [place], [address] Phone: [phone]   Fax:  [phone]  Physical Therapy Treatment  Patient Details  Name: Craig Scott MRN: [id number] Date of Birth: March 18, 1956 Referring Provider:  Wylene Simmer, MD  Encounter Date: 12/03/2014      PT End of Session - 12/03/14 1724    Visit Number 3   Number of Visits 8   Date for PT Re-Evaluation 12/05/14   Authorization Type BCBS   Authorization Time Period 11/14/14 to 01/14/15   PT Start Time 1530   PT Stop Time 1615   PT Time Calculation (min) 45 min   Activity Tolerance Patient tolerated treatment well   Behavior During Therapy [facility] for tasks assessed/performed      Past Medical History  Diagnosis Date  . High cholesterol   . GERD (gastroesophageal reflux disease)   . Anxiety   . Neuropathy     both feet  . Arthritis     ra  . Lynch syndrome     has gene that causes cancer    Past Surgical History  Procedure Laterality Date  . Knee surgery Right     X2  . Colonoscopy   12/09/01    RMR: Normal normal rectum/ A few scattered left-sided diverticula.  The remainder of the colonic  mucosa appeared normal  . Colonoscopy  12/15/2002    RMR: Normal rectum, scattered left-sided diverticula.  The remainder of the colonic mucosa appeared normal  . Colonoscopy  04/10/2004    RMR: Normal rectum/Sigmoid diverticula/The remainder of the colonic mucosa appeared normal  . Esophagogastroduodenoscopy  12/23/2005    RMR:   Esophagogastric peptic stricture with reflux esophagitis, status post  dilation as described above/ Small hiatal hernia, otherwise normal stomach.  Bulbar erosions otherwise normal D1 and D2  . Colonoscopy  12/23/2005    RMR: Normal rectum, scattered sigmoid diverticula Colonic mucosa appeared normal  . Colonoscopy    01/20/2008    RMR: Distal diminutive rectal polyps, status post cold biopsy removal  otherwise, normal rectum/ biopsy  removal; scattered sigmoid diverticula; and benign-appearing/ulcers about the ileocecal valve, status post biopsy.  Remainder of colonic mucosa and terminal mucosa appeared normal.  . Colonoscopy  05/22/2010    RMR: distal diminutive rectal polyp s/p bx otherwise normal/few scattered pancolonic diverticula  . Nose surgery    . Colonoscopy  09/16/2012    Procedure: COLONOSCOPY;  Surgeon: Daneil Dolin, MD;  [facility];  Service: Endoscopy;  Laterality: N/A;  7:30  . Eye surgery  as child  . Tympanostomy tube placement Right     early 2014  . Joint replacement  09/30/2010    Right knee  . Total knee arthroplasty Left 10/23/2013    Procedure: LEFT TOTAL KNEE ARTHROPLASTY;  Surgeon: [name], MD;  [facility];  Service: Orthopedics;  Laterality: Left;    There were no vitals filed for this visit.  Visit Diagnosis:  Pain in joint, ankle and foot, right  Difficulty walking due to ankle and foot joint  Weakness generalized  Decreased mobility and endurance      Subjective Assessment - 12/03/14 1724    Symptoms Pt 10 minutes late for appointment.  States pain is always elevated at the end of the day.  continues to wear ALSO with work boot.  Lt THR scheduled for 03/20/15.   Currently in Pain? Yes   Pain Score 6    Pain Location Ankle  Pain Orientation Right                       [facility] Adult PT Treatment/Exercise - 12/03/14 1526    Manual Therapy   Manual Therapy Joint mobilization;Massage;Passive ROM   Joint Mobilization to increase inversion and extension of second and third digits   Massage to Plantar fascia and medial arch   Passive ROM to Rt ankle and toes   Ankle Exercises: Stretches   Plantar Fascia Stretch 3 reps;30 seconds   Gastroc Stretch 3 reps;30 seconds   Gastroc Stretch Limitations slant board   Ankle Exercises: Machines for Strengthening   Cybex Leg Press plantar flexion 4Pl eccentric slowing 10 reps   Ankle Exercises: Seated    Towel Crunch Limitations   Towel Inversion/Eversion 5 reps   Towel Inversion/Eversion Weights (lbs) inversion only   Heel Raises 10 reps   Other Seated Ankle Exercises long sitting red theraband 10reps inversion   Additional Ankle Exercises DO NOT USE   Towel Crunch Limitations 2 minutes                  PT Short Term Goals - 11/14/14 1644    PT SHORT TERM GOAL #1   Title Patient will demonstrate the ability to sit for at least 2 hours with pain no more than 4/10 R ankle/foot in order to enhance ability to participate in functional seated tasks   PT SHORT TERM GOAL #2   Title Patient will demonstrate the ability to stand for 75 minutes and to ambulate for at least 30 minutes with pain no more than 4/10 R ankle/foot  in order to improve functional participation in work tasks   Time 4   Period Weeks   Status New   PT [place] #3   Title Patient will demonstrate improved ankle dorsiflexion to at least 18 degrees bilaterally; also R ankle plantar flexion to at least 45 degrees   Time 4   Period Weeks   Status New   PT SHORT TERM GOAL #4   Title Patient will demonstrate ability to independently manage appropriate HEP in order to assist in treating his condition and prevent recurrence of pain   Time 4   Period Weeks   Status New   PT SHORT TERM GOAL #5   Title Patient will be able to ambulate from his bedroom to the shower in his house without holding onto walls or furniture walking to maintain stability   Time 4   Period Weeks   Status New   Additional Short Term Goals   Additional Short Term Goals Yes   PT SHORT TERM GOAL #6   Title Patient will demonstrate general hip strength of at least 4/5 in order to improve overall stability, improve gait mechanics, and reduce pain in R ankle/foot   Time 4   Period Weeks   Status New           PT Long Term Goals - 11/14/14 1647    PT LONG TERM GOAL #1   Title Patient will demonstrate the ability to independently  manage advanced HEP in order to assist in managing condition and reduce chances of recurrence of pain   Time 8   Period Weeks   Status New   PT LONG TERM GOAL #2   Title Patient will demonstrate ability to perform sitting tasks for 4 consecutive hours in order to enhance ability to perform work tasks and allow for long drives,  pain R ankle/foot no more than 2/10   Time 8   Period Weeks   Status New   PT LONG TERM GOAL #3   Title Patient will demonstrate the ability to perform standing tasks for at least 3 consecutive hours and will be able to ambulate for at least 60 consecutive minutes with pain R ankle/foot no more than 2/10   Time 8   Period Weeks   Status New   PT LONG TERM GOAL #4   Title Patient will demonstrate the ability to consistently ambulate with improved gait mechanics on stable and unstable surfaces and including but not limited to improved step length, gait speed, toe off mechanics, improved calcaneal mobility, and good balance reaction skills   Time 8   Period Weeks   Status New               Plan - 12/03/14 1726    Clinical Impression Statement Pt admitted to not completing HEP over the weeekend.  Added calf eccentrics on cybex machine without c/o pain and instructed with theraband inversion.  Pt given red theraband to take home for HEP.  Manual completed with overall pain reduction at EOS.     PT Frequency --  requests x1/week due to high co-pay of $52/session   PT Next Visit Plan Begin eccentric calf raises on cybex machine in seated and theraband exercises to strethen ankle.  Progress to Functional ankle strengthening as tolerated and including eccentric activities for R posterior tibialis; functional stretching; manual mobilization of R foot, soft tissue mobilization if appropriate/tolerated especially to plantar fascia R foot. Assess and update HEP if appropriate.    PT Home Exercise Plan Added towel inversion and toe pick up-  Update weekly as patient will do  majority as HEP.   Consulted and Agree with Plan of Care Patient        Problem List Patient Active Problem List   Diagnosis Date Noted  . Hyponatremia 10/24/2013  . OA (osteoarthritis) of knee 10/23/2013  . Hepatomegaly 09/01/2012  . Lynch syndrome 09/01/2012  . GERD (gastroesophageal reflux disease) 09/01/2012  . Knee pain 08/12/2011    Teena Irani, PTA/CLT [phone] 12/03/2014, 5:28 PM  [facility] [address] [place], [place], [address] Phone: [phone]   Fax:  [phone]

## 2014-12-10 ENCOUNTER — Ambulatory Visit (HOSPITAL_COMMUNITY): Payer: BC Managed Care – PPO | Admitting: Physical Therapy

## 2014-12-10 DIAGNOSIS — R531 Weakness: Secondary | ICD-10-CM

## 2014-12-10 DIAGNOSIS — R262 Difficulty in walking, not elsewhere classified: Secondary | ICD-10-CM

## 2014-12-10 DIAGNOSIS — Z7409 Other reduced mobility: Secondary | ICD-10-CM

## 2014-12-10 DIAGNOSIS — M25571 Pain in right ankle and joints of right foot: Secondary | ICD-10-CM | POA: Diagnosis not present

## 2014-12-10 NOTE — Therapy (Signed)
[place], [place], [address] Phone: [phone]   Fax:  [phone]  Physical Therapy Treatment  Patient Details  Name: Craig Scott MRN: [id number] Date of Birth: [birth year] Referring Provider:  Wylene Simmer, MD  Encounter Date: 12/10/2014      PT End of Session - 12/10/14 1605    Visit Number 4   Number of Visits 8   Date for PT Re-Evaluation 12/05/14   Authorization Type BCBS   Authorization Time Period 11/14/14 to 01/14/15   PT Start Time 1522   PT Stop Time 1605   PT Time Calculation (min) 43 min   Activity Tolerance Patient tolerated treatment well   Behavior During Therapy [facility] for tasks assessed/performed      Past Medical History  Diagnosis Date  . High cholesterol   . GERD (gastroesophageal reflux disease)   . Anxiety   . Neuropathy     both feet  . Arthritis     ra  . Lynch syndrome     has gene that causes cancer    Past Surgical History  Procedure Laterality Date  . Knee surgery Right     X2  . Colonoscopy   12/09/01    RMR: Normal normal rectum/ A few scattered left-sided diverticula.  The remainder of the colonic  mucosa appeared normal  . Colonoscopy  12/15/2002    RMR: Normal rectum, scattered left-sided diverticula.  The remainder of the colonic mucosa appeared normal  . Colonoscopy  04/10/2004    RMR: Normal rectum/Sigmoid diverticula/The remainder of the colonic mucosa appeared normal  . Esophagogastroduodenoscopy  12/23/2005    RMR:   Esophagogastric peptic stricture with reflux esophagitis, status post  dilation as described above/ Small hiatal hernia, otherwise normal stomach.  Bulbar erosions otherwise normal D1 and D2  . Colonoscopy  12/23/2005    RMR: Normal rectum, scattered sigmoid diverticula Colonic mucosa appeared normal  . Colonoscopy    01/20/2008    RMR: Distal diminutive rectal polyps, status post cold biopsy removal  otherwise, normal rectum/ biopsy  removal; scattered sigmoid diverticula; and benign-appearing/ulcers about the ileocecal valve, status post biopsy.  Remainder of colonic mucosa and terminal mucosa appeared normal.  . Colonoscopy  05/22/2010    RMR: distal diminutive rectal polyp s/p bx otherwise normal/few scattered pancolonic diverticula  . Nose surgery    . Colonoscopy  09/16/2012    Procedure: COLONOSCOPY;  Surgeon: Daneil Dolin, MD;  [facility];  Service: Endoscopy;  Laterality: N/A;  7:30  . Eye surgery  as child  . Tympanostomy tube placement Right     early 2014  . Joint replacement  09/30/2010    Right knee  . Total knee arthroplasty Left 10/23/2013    Procedure: LEFT TOTAL KNEE ARTHROPLASTY;  Surgeon: [name], MD;  Location: [facility];  Service: Orthopedics;  Laterality: Left;    There were no vitals filed for this visit.  Visit Diagnosis:  Pain in joint, ankle and foot, right  Difficulty walking due to ankle and foot joint  Weakness generalized  Decreased mobility and endurance      Subjective Assessment - 12/10/14 1609    Symptoms PT states his pain remains constant.  Reports compliance with HEP.   Currently in Pain? Yes   Pain Score 6    Pain Location Ankle  [place] Adult PT Treatment/Exercise - 12/10/14 1530    Manual Therapy   Manual Therapy Joint mobilization;Massage;Passive ROM   Joint Mobilization to increase inversion and extension of second and third digits   Massage to Plantar fascia and medial arch   Passive ROM to Rt ankle and toes   Ankle Exercises: Stretches   Plantar Fascia Stretch 3 reps;30 seconds   Gastroc Stretch 3 reps;30 seconds   Gastroc Stretch Limitations slant board   Other Stretch 3D ankle excursions 10 reps each                  PT Short Term Goals - 11/14/14 1644    PT SHORT TERM GOAL #1   Title Patient will demonstrate the ability to sit for at least 2 hours with pain no more than 4/10 R ankle/foot in  order to enhance ability to participate in functional seated tasks   PT SHORT TERM GOAL #2   Title Patient will demonstrate the ability to stand for 75 minutes and to ambulate for at least 30 minutes with pain no more than 4/10 R ankle/foot  in order to improve functional participation in work tasks   Time 4   Period Weeks   Status New   PT SHORT TERM GOAL #3   Title Patient will demonstrate improved ankle dorsiflexion to at least 18 degrees bilaterally; also R ankle plantar flexion to at least 45 degrees   Time 4   Period Weeks   Status New   PT SHORT TERM GOAL #4   Title Patient will demonstrate ability to independently manage appropriate HEP in order to assist in treating his condition and prevent recurrence of pain   Time 4   Period Weeks   Status New   PT SHORT TERM GOAL #5   Title Patient will be able to ambulate from his bedroom to the shower in his house without holding onto walls or furniture walking to maintain stability   Time 4   Period Weeks   Status New   Additional Short Term Goals   Additional Short Term Goals Yes   PT SHORT TERM GOAL #6   Title Patient will demonstrate general hip strength of at least 4/5 in order to improve overall stability, improve gait mechanics, and reduce pain in R ankle/foot   Time 4   Period Weeks   Status New           PT Long Term Goals - 11/14/14 1647    PT LONG TERM GOAL #1   Title Patient will demonstrate the ability to independently manage advanced HEP in order to assist in managing condition and reduce chances of recurrence of pain   Time 8   Period Weeks   Status New   PT LONG TERM GOAL #2   Title Patient will demonstrate ability to perform sitting tasks for 4 consecutive hours in order to enhance ability to perform work tasks and allow for long drives, pain R ankle/foot no more than 2/10   Time 8   Period Weeks   Status New   PT LONG TERM GOAL #3   Title Patient will demonstrate the ability to perform standing tasks for  at least 3 consecutive hours and will be able to ambulate for at least 60 consecutive minutes with pain R ankle/foot no more than 2/10   Time 8   Period Weeks   Status New   PT LONG TERM GOAL #4   Title Patient will demonstrate the ability  to consistently ambulate with improved gait mechanics on stable and unstable surfaces and including but not limited to improved step length, gait speed, toe off mechanics, improved calcaneal mobility, and good balance reaction skills   Time 8   Period Weeks   Status New               Plan - 12/10/14 1605    Clinical Impression Statement Progressed with 3D ankle excursion to further increase ankle ROM.  Pt with noted pain reduction at EOS after completing manual to Rt foot, ankle and toes.   PT Frequency --  requests x1/week due to high co-pay of $52/session   PT Next Visit Plan Progress to Functional ankle strengthening as tolerated and including eccentric activities for R posterior tibialis; functional stretching; manual mobilization of R foot, soft tissue mobilization if appropriate/tolerated especially to plantar fascia R foot. Assess and update HEP if appropriate.    PT Home Exercise Plan Added towel inversion and toe pick up-  Update weekly as patient will do majority as HEP.   Consulted and Agree with Plan of Care Patient        Problem List Patient Active Problem List   Diagnosis Date Noted  . Hyponatremia 10/24/2013  . OA (osteoarthritis) of knee 10/23/2013  . Hepatomegaly 09/01/2012  . Lynch syndrome 09/01/2012  . GERD (gastroesophageal reflux disease) 09/01/2012  . Knee pain 08/12/2011    Teena Irani, PTA/CLT [phone] 12/10/2014, 4:10 PM  [facility] [address] [place], [place], [address] Phone: [phone]   Fax:  [phone]

## 2014-12-17 ENCOUNTER — Ambulatory Visit (HOSPITAL_COMMUNITY): Payer: BC Managed Care – PPO | Attending: Orthopedic Surgery | Admitting: Physical Therapy

## 2014-12-17 DIAGNOSIS — M25571 Pain in right ankle and joints of right foot: Secondary | ICD-10-CM | POA: Insufficient documentation

## 2014-12-17 DIAGNOSIS — Z7409 Other reduced mobility: Secondary | ICD-10-CM | POA: Insufficient documentation

## 2014-12-17 DIAGNOSIS — M6281 Muscle weakness (generalized): Secondary | ICD-10-CM | POA: Insufficient documentation

## 2014-12-17 DIAGNOSIS — R262 Difficulty in walking, not elsewhere classified: Secondary | ICD-10-CM | POA: Insufficient documentation

## 2014-12-24 ENCOUNTER — Ambulatory Visit (HOSPITAL_COMMUNITY): Payer: BC Managed Care – PPO | Admitting: Physical Therapy

## 2014-12-31 ENCOUNTER — Ambulatory Visit (HOSPITAL_COMMUNITY): Payer: BC Managed Care – PPO | Admitting: Physical Therapy

## 2014-12-31 NOTE — Therapy (Signed)
[place], [place], [address] Phone: [phone]   Fax:  [phone]  Patient Details  Name: Craig Scott MRN: [id number] Date of Birth: 1956-07-07 Referring Provider:  Wylene Simmer, MD  Encounter Date: 12/10/2014  PHYSICAL THERAPY DISCHARGE SUMMARY  Visits from Start of Care: 4  Current functional level related to goals / functional outcomes: Patient had two consecutive no shows recently; called patient and he explained that he feels unable to continue with therapy due to financial constraints, and is having surgery on his hip soon anyway. Agreeable to DC at this time and is continuing with his exercises at home.    Remaining deficits: Pain, reduced range of motion, reduced general strength, gait deviations   Education / Equipment: Educated that he will need new referral from MD to return to therapy.  Plan: Patient agrees to discharge.  Patient goals were not met. Patient is being discharged due to the patient's request.  ?????       Deniece Ree PT, DPT [place]  [place], [place], [address] Phone: [phone]   Fax:  [phone]

## 2015-01-07 ENCOUNTER — Encounter (HOSPITAL_COMMUNITY): Payer: Self-pay | Admitting: Physical Therapy

## 2015-01-14 ENCOUNTER — Encounter (HOSPITAL_COMMUNITY): Payer: Self-pay | Admitting: Physical Therapy

## 2015-03-07 ENCOUNTER — Ambulatory Visit: Payer: Self-pay | Admitting: Orthopedic Surgery

## 2015-03-07 NOTE — Progress Notes (Signed)
Preoperative surgical orders have been place into the Epic hospital system for Craig Scott on 03/07/2015, 10:33 AM  by Mickel Crow for surgery on 03-20-2015.  Preop Total Hip - Anterior Approach orders including IV Tylenol, and IV Decadron as long as there are no contraindications to the above medications. Arlee Muslim, PA-C

## 2015-03-13 NOTE — H&P (Signed)
TOTAL HIP ADMISSION H&P  Patient is admitted for left total hip arthroplasty.  Subjective:  Chief Complaint: left hip pain  HPI: Craig Scott, 59 y.o. male, has a history of pain and functional disability in the left hip(s) due to arthritis and AVN and patient has failed non-surgical conservative treatments for greater than 12 weeks to include NSAID's and/or analgesics, flexibility and strengthening excercises and activity modification.  Onset of symptoms was gradual starting 2 years ago with gradually worsening course since that time.The patient noted no past surgery on the left hip(s).  Patient currently rates pain in the left hip at 7 out of 10 with activity. Patient has night pain, worsening of pain with activity and weight bearing, pain that interfers with activities of daily living, pain with passive range of motion and crepitus. Patient has evidence of periarticular osteophytes and joint space narrowing by imaging studies. This condition presents safety issues increasing the risk of falls. This patient has had avascular necrosis of the hip.  There is no current active infection.  Patient Active Problem List   Diagnosis Date Noted  . Hyponatremia 10/24/2013  . OA (osteoarthritis) of knee 10/23/2013  . Hepatomegaly 09/01/2012  . Lynch syndrome 09/01/2012  . GERD (gastroesophageal reflux disease) 09/01/2012  . Knee pain 08/12/2011   Past Medical History  Diagnosis Date  . High cholesterol   . GERD (gastroesophageal reflux disease)   . Anxiety   . Neuropathy     both feet  . Arthritis     ra  . Lynch syndrome     has gene that causes cancer    Past Surgical History  Procedure Laterality Date  . Knee surgery Right     X2  . Colonoscopy   12/09/01    RMR: Normal normal rectum/ A few scattered left-sided diverticula.  The remainder of the colonic  mucosa appeared normal  . Colonoscopy  12/15/2002    RMR: Normal rectum, scattered left-sided diverticula.  The remainder of the  colonic mucosa appeared normal  . Colonoscopy  04/10/2004    RMR: Normal rectum/Sigmoid diverticula/The remainder of the colonic mucosa appeared normal  . Esophagogastroduodenoscopy  12/23/2005    RMR:   Esophagogastric peptic stricture with reflux esophagitis, status post  dilation as described above/ Small hiatal hernia, otherwise normal stomach.  Bulbar erosions otherwise normal D1 and D2  . Colonoscopy  12/23/2005    RMR: Normal rectum, scattered sigmoid diverticula Colonic mucosa appeared normal  . Colonoscopy    01/20/2008    RMR: Distal diminutive rectal polyps, status post cold biopsy removal  otherwise, normal rectum/ biopsy removal; scattered sigmoid diverticula; and benign-appearing/ulcers about the ileocecal valve, status post biopsy.  Remainder of colonic mucosa and terminal mucosa appeared normal.  . Colonoscopy  05/22/2010    RMR: distal diminutive rectal polyp s/p bx otherwise normal/few scattered pancolonic diverticula  . Nose surgery    . Colonoscopy  09/16/2012    Procedure: COLONOSCOPY;  Surgeon: Daneil Dolin, MD;  Location: [facility];  Service: Endoscopy;  Laterality: N/A;  7:30  . Eye surgery  as child  . Tympanostomy tube placement Right     early 2014  . Joint replacement  09/30/2010    Right knee  . Total knee arthroplasty Left 10/23/2013    Procedure: LEFT TOTAL KNEE ARTHROPLASTY;  Surgeon: [name], MD;  Location: [facility]S;  Service: Orthopedics;  Laterality: Left;     Current outpatient prescriptions:  .  ALPRAZolam (XANAX) 1 MG  tablet, Take 1 mg by mouth every 6 (six) hours as needed for anxiety. anxiety, Disp: , Rfl:  .  cetirizine (ZYRTEC) 10 MG tablet, Take 10 mg by mouth daily., Disp: , Rfl:  .  niacin (NIASPAN) 500 MG CR tablet, Take 500 mg by mouth every morning., Disp: , Rfl:  .  omeprazole (PRILOSEC) 20 MG capsule, Take 20 mg by mouth daily., Disp: , Rfl:  .  Potassium 99 MG TABS, Take 1 tablet by mouth daily., Disp: , Rfl:  .  simvastatin  (ZOCOR) 20 MG tablet, Take 20 mg by mouth every morning. , Disp: , Rfl:  .  traMADol (ULTRAM) 50 MG tablet, Take 1-2 tablets (50-100 mg total) by mouth every 6 (six) hours as needed (mild to moderate pain)., Disp: 60 tablet, Rfl: 1 .  zolpidem (AMBIEN) 10 MG tablet, Take 10 mg by mouth at bedtime as needed for sleep., Disp: , Rfl:   Allergies  Allergen Reactions  . Celebrex [Celecoxib] Rash         History  Substance Use Topics  . Smoking status: Former Smoker    Types: Cigars    Quit date: 09/14/2005  . Smokeless tobacco: Never Used     Comment: quit 4-5 years ago  . Alcohol Use: Yes     Comment: daily, multiple beers in the evening     Family History  Problem Relation Age of Onset  . Colon cancer Brother     49   . Colon cancer Brother     66  . Colon cancer Cousin     79 years old onset  . Colon cancer Cousin     49 years onset  . Colon cancer Mother     age 49      Review of Systems  Constitutional: Negative.   Eyes: Negative.   Respiratory: Negative.   Cardiovascular: Negative.   Gastrointestinal: Positive for constipation. Negative for heartburn, nausea, vomiting, abdominal pain, diarrhea, blood in stool and melena.  Genitourinary: Negative.   Musculoskeletal: Positive for back pain and joint pain. Negative for myalgias, falls and neck pain.       Left hip pain  Skin: Negative.   Neurological: Negative.   Endo/Heme/Allergies: Negative.   Psychiatric/Behavioral: Negative.     Objective:  Physical Exam  Constitutional: He is oriented to person, place, and time. He appears well-developed. No distress.  Overweight  HENT:  Head: Normocephalic and atraumatic.  Right Ear: External ear normal.  Left Ear: External ear normal.  Nose: Nose normal.  Mouth/Throat: Oropharynx is clear and moist.  Eyes: Conjunctivae and EOM are normal.  Neck: Normal range of motion. Neck supple.  Cardiovascular: Normal rate, regular rhythm, normal heart sounds and intact distal  pulses.   No murmur heard. Respiratory: Effort normal and breath sounds normal. No respiratory distress. He has no wheezes.  GI: Soft. Bowel sounds are normal. He exhibits no distension. There is no tenderness.  Musculoskeletal:       Right hip: Normal.       Left hip: He exhibits decreased range of motion and crepitus.       Right knee: Normal.       Left knee: Normal.  His right hip has normal motion without discomfort. Left hip flexion about 100. No internal rotation, about 20 external rotation, 20 abduction.  Neurological: He is alert and oriented to person, place, and time. He has normal strength and normal reflexes. No sensory deficit.  Skin: No rash noted. He  is not diaphoretic. No erythema.  Psychiatric: He has a normal mood and affect. His behavior is normal.    Vitals Weight: 245 lb Height: 72in Body Surface Area: 2.32 m Body Mass Index: 33.23 kg/m  Pulse: 92 (Regular)  BP: 140/82 (Sitting, Left Arm, Standard)  Imaging Review Plain radiographs demonstrate severe degenerative joint disease of the left hip(s). The bone quality appears to be fair for age and reported activity level.  Assessment/Plan:  End stage avascular necrosis with collapse, left hip(s)  The patient history, physical examination, clinical judgement of the provider and imaging studies are consistent with end stage degenerative joint disease of the left hip(s) and total hip arthroplasty is deemed medically necessary. The treatment options including medical management, injection therapy, arthroscopy and arthroplasty were discussed at length. The risks and benefits of total hip arthroplasty were presented and reviewed. The risks due to aseptic loosening, infection, stiffness, dislocation/subluxation,  thromboembolic complications and other imponderables were discussed.  The patient acknowledged the explanation, agreed to proceed with the plan and consent was signed. Patient is being admitted for inpatient  treatment for surgery, pain control, PT, OT, prophylactic antibiotics, VTE prophylaxis, progressive ambulation and ADL's and discharge planning.The patient is planning to be discharged home with home health services    PCP: Dr. Redmond School TXA IV   Ardeen Jourdain, PA-C

## 2015-03-15 ENCOUNTER — Encounter (HOSPITAL_COMMUNITY)
Admission: RE | Admit: 2015-03-15 | Discharge: 2015-03-15 | Disposition: A | Discharge status: Home / Self Care | Payer: BC Managed Care – PPO | Source: Ambulatory Visit | Attending: Orthopedic Surgery | Admitting: Orthopedic Surgery

## 2015-03-15 ENCOUNTER — Encounter (HOSPITAL_COMMUNITY): Payer: Self-pay

## 2015-03-15 DIAGNOSIS — Z01812 Encounter for preprocedural laboratory examination: Secondary | ICD-10-CM | POA: Diagnosis not present

## 2015-03-15 DIAGNOSIS — M879 Osteonecrosis, unspecified: Secondary | ICD-10-CM | POA: Insufficient documentation

## 2015-03-15 LAB — CBC
HCT: 42.3 % (ref 39.0–52.0)
Hemoglobin: 14.5 g/dL (ref 13.0–17.0)
MCH: 29.2 pg (ref 26.0–34.0)
MCHC: 34.3 g/dL (ref 30.0–36.0)
MCV: 85.3 fL (ref 78.0–100.0)
Platelets: 246 10*3/uL (ref 150–400)
RBC: 4.96 MIL/uL (ref 4.22–5.81)
RDW: 12.3 % (ref 11.5–15.5)
WBC: 7.1 10*3/uL (ref 4.0–10.5)

## 2015-03-15 LAB — URINALYSIS, ROUTINE W REFLEX MICROSCOPIC
BILIRUBIN URINE: NEGATIVE
Glucose, UA: NEGATIVE mg/dL
Hgb urine dipstick: NEGATIVE
KETONES UR: NEGATIVE mg/dL
Leukocytes, UA: NEGATIVE
NITRITE: NEGATIVE
Protein, ur: NEGATIVE mg/dL
Specific Gravity, Urine: 1.012 (ref 1.005–1.030)
UROBILINOGEN UA: 1 mg/dL (ref 0.0–1.0)
pH: 6.5 (ref 5.0–8.0)

## 2015-03-15 LAB — COMPREHENSIVE METABOLIC PANEL
ALT: 32 U/L (ref 17–63)
AST: 24 U/L (ref 15–41)
Albumin: 4.4 g/dL (ref 3.5–5.0)
Alkaline Phosphatase: 86 U/L (ref 38–126)
Anion gap: 9 (ref 5–15)
BUN: 16 mg/dL (ref 6–20)
CHLORIDE: 101 mmol/L (ref 101–111)
CO2: 26 mmol/L (ref 22–32)
Calcium: 9 mg/dL (ref 8.9–10.3)
Creatinine, Ser: 0.92 mg/dL (ref 0.61–1.24)
GFR calc Af Amer: >60 mL/min (ref >60)
GFR calc non Af Amer: >60 mL/min (ref >60)
GLUCOSE: 94 mg/dL (ref 65–99)
Potassium: 3.8 mmol/L (ref 3.5–5.1)
Sodium: 136 mmol/L (ref 135–145)
Total Bilirubin: 0.4 mg/dL (ref 0.3–1.2)
Total Protein: 7.8 g/dL (ref 6.5–8.1)

## 2015-03-15 LAB — PROTIME-INR
INR: 0.96 (ref 0.00–1.49)
Prothrombin Time: 13 seconds (ref 11.6–15.2)

## 2015-03-15 LAB — SURGICAL PCR SCREEN
MRSA, PCR: NEGATIVE
STAPHYLOCOCCUS AUREUS: NEGATIVE

## 2015-03-15 LAB — APTT: aPTT: 32 seconds (ref 24–37)

## 2015-03-15 NOTE — Patient Instructions (Signed)
ZYRUS HETLAND  03/15/2015   Your procedure is scheduled on: Wednesday March 20, 2015  Report to [facility] Main  Entrance take [place]  elevators to 3rd floor to  [place] at 11:50 AM.  Call this number if you have problems the morning of surgery [phone]   Remember: ONLY 1 PERSON MAY GO WITH YOU TO SHORT STAY TO GET  READY MORNING OF [place].  Do not eat food After Midnight but may take clear liquids till 7:45 am day of surgery then nothing by mouth.     Take these medicines the morning of surgery with A SIP OF WATER: Alprazolam (Xanax) if needed; Omeprazole (Prilosec);                                You may not have any metal on your body including hair pins and              piercings  Do not wear jewelry, lotions, powders or colognes, deodorant                          Men may shave face and neck.   Do not bring valuables to the hospital. [place].  Contacts, dentures or bridgework may not be worn into surgery.  Leave suitcase in the car. After surgery it may be brought to your room.                  Please read over the following fact sheets you were given:MRSA INFORMATION SHEET; BLOOD TRANSFUSION INFORMATION SHEET; INCENTIVE SPIROMETER  _____________________________________________________________________             [facility] - Preparing for Surgery Before surgery, you can play an important role.  Because skin is not sterile, your skin needs to be as free of germs as possible.  You can reduce the number of germs on your skin by washing with CHG (chlorahexidine gluconate) soap before surgery.  CHG is an antiseptic cleaner which kills germs and bonds with the skin to continue killing germs even after washing. Please DO NOT use if you have an allergy to CHG or antibacterial soaps.  If your skin becomes reddened/irritated stop using the CHG and inform your nurse when you arrive at Short  Stay. Do not shave (including legs and underarms) for at least 48 hours prior to the first CHG shower.  You may shave your face/neck. Please follow these instructions carefully:  1.  Shower with CHG Soap the night before surgery and the  morning of Surgery.  2.  If you choose to wash your hair, wash your hair first as usual with your  normal  shampoo.  3.  After you shampoo, rinse your hair and body thoroughly to remove the  shampoo.                           4.  Use CHG as you would any other liquid soap.  You can apply chg directly  to the skin and wash                       Gently with a  scrungie or clean washcloth.  5.  Apply the CHG Soap to your body ONLY FROM THE NECK DOWN.   Do not use on face/ open                           Wound or open sores. Avoid contact with eyes, ears mouth and genitals (private parts).                       Wash face,  Genitals (private parts) with your normal soap.             6.  Wash thoroughly, paying special attention to the area where your surgery  will be performed.  7.  Thoroughly rinse your body with warm water from the neck down.  8.  DO NOT shower/wash with your normal soap after using and rinsing off  the CHG Soap.                9.  Pat yourself dry with a clean towel.            10.  Wear clean pajamas.            11.  Place clean sheets on your bed the night of your first shower and do not  sleep with pets. Day of Surgery : Do not apply any lotions/deodorants the morning of surgery.  Please wear clean clothes to the hospital/surgery center.  FAILURE TO FOLLOW THESE INSTRUCTIONS MAY RESULT IN THE CANCELLATION OF YOUR SURGERY PATIENT SIGNATURE_________________________________  NURSE SIGNATURE__________________________________  ________________________________________________________________________    CLEAR LIQUID DIET   Foods Allowed                                                                     Foods Excluded  Coffee and tea,  regular and decaf                             liquids that you cannot  Plain Jell-O in any flavor                                             see through such as: Fruit ices (not with fruit pulp)                                     milk, soups, orange juice  Iced Popsicles                                    All solid food Carbonated beverages, regular and diet                                    Cranberry, grape and apple juices Sports drinks like Gatorade Lightly seasoned clear broth or consume(fat free) Sugar, honey syrup  Sample Menu Breakfast  Lunch                                     Supper Cranberry juice                    Beef broth                            Chicken broth Jell-O                                     Grape juice                           Apple juice Coffee or tea                        Jell-O                                      Popsicle                                                Coffee or tea                        Coffee or tea  _____________________________________________________________________    Incentive Spirometer  An incentive spirometer is a tool that can help keep your lungs clear and active. This tool measures how well you are filling your lungs with each breath. Taking long deep breaths may help reverse or decrease the chance of developing breathing (pulmonary) problems (especially infection) following:  A long period of time when you are unable to move or be active. BEFORE THE PROCEDURE   If the spirometer includes an indicator to show your best effort, your nurse or respiratory therapist will set it to a desired goal.  If possible, sit up straight or lean slightly forward. Try not to slouch.  Hold the incentive spirometer in an upright position. INSTRUCTIONS FOR USE   Sit on the edge of your bed if possible, or sit up as far as you can in bed or on a chair.  Hold the incentive spirometer in an upright  position.  Breathe out normally.  Place the mouthpiece in your mouth and seal your lips tightly around it.  Breathe in slowly and as deeply as possible, raising the piston or the ball toward the top of the column.  Hold your breath for 3-5 seconds or for as long as possible. Allow the piston or ball to fall to the bottom of the column.  Remove the mouthpiece from your mouth and breathe out normally.  Rest for a few seconds and repeat Steps 1 through 7 at least 10 times every 1-2 hours when you are awake. Take your time and take a few normal breaths between deep breaths.  The spirometer may include an indicator to show your best effort. Use the indicator as a goal to work toward during each repetition.  After each set of 10 deep breaths,  practice coughing to be sure your lungs are clear. If you have an incision (the cut made at the time of surgery), support your incision when coughing by placing a pillow or rolled up towels firmly against it. Once you are able to get out of bed, walk around indoors and cough well. You may stop using the incentive spirometer when instructed by your caregiver.  RISKS AND COMPLICATIONS  Take your time so you do not get dizzy or light-headed.  If you are in pain, you may need to take or ask for pain medication before doing incentive spirometry. It is harder to take a deep breath if you are having pain. AFTER USE  Rest and breathe slowly and easily.  It can be helpful to keep track of a log of your progress. Your caregiver can provide you with a simple table to help with this. If you are using the spirometer at home, follow these instructions: [place] IF:   You are having difficultly using the spirometer.  You have trouble using the spirometer as often as instructed.  Your pain medication is not giving enough relief while using the spirometer.  You develop fever of 100.5 F (38.1 C) or higher. SEEK IMMEDIATE MEDICAL CARE IF:   You cough  up bloody sputum that had not been present before.  You develop fever of 102 F (38.9 C) or greater.  You develop worsening pain at or near the incision site. MAKE SURE YOU:   Understand these instructions.  Will watch your condition.  Will get help right away if you are not doing well or get worse. Document Released: 01/11/2007 Document Revised: 11/23/2011 Document Reviewed: 03/14/2007 ExitCare Patient Information 2014 [employer], [place].   ________________________________________________________________________  WHAT IS A BLOOD TRANSFUSION? Blood Transfusion Information  A transfusion is the replacement of blood or some of its parts. Blood is made up of multiple cells which provide different functions.  Red blood cells carry oxygen and are used for blood loss replacement.  White blood cells fight against infection.  Platelets control bleeding.  Plasma helps clot blood.  Other blood products are available for specialized needs, such as hemophilia or other clotting disorders. BEFORE THE TRANSFUSION  Who gives blood for transfusions?   Healthy volunteers who are fully evaluated to make sure their blood is safe. This is blood bank blood. Transfusion therapy is the safest it has ever been in the practice of medicine. Before blood is taken from a donor, a complete history is taken to make sure that person has no history of diseases nor engages in risky social behavior (examples are intravenous drug use or sexual activity with multiple partners). The donor's travel history is screened to minimize risk of transmitting infections, such as malaria. The donated blood is tested for signs of infectious diseases, such as HIV and hepatitis. The blood is then tested to be sure it is compatible with you in order to minimize the chance of a transfusion reaction. If you or a relative donates blood, this is often done in anticipation of surgery and is not appropriate for emergency situations. It takes  many days to process the donated blood. RISKS AND COMPLICATIONS Although transfusion therapy is very safe and saves many lives, the main dangers of transfusion include:   Getting an infectious disease.  Developing a transfusion reaction. This is an allergic reaction to something in the blood you were given. Every precaution is taken to prevent this. The decision to have a blood transfusion has been  considered carefully by your caregiver before blood is given. Blood is not given unless the benefits outweigh the risks. AFTER THE TRANSFUSION  Right after receiving a blood transfusion, you will usually feel much better and more energetic. This is especially true if your red blood cells have gotten low (anemic). The transfusion raises the level of the red blood cells which carry oxygen, and this usually causes an energy increase.  The nurse administering the transfusion will monitor you carefully for complications. HOME CARE INSTRUCTIONS  No special instructions are needed after a transfusion. You may find your energy is better. Speak with your caregiver about any limitations on activity for underlying diseases you may have. SEEK MEDICAL CARE IF:   Your condition is not improving after your transfusion.  You develop redness or irritation at the intravenous (IV) site. SEEK IMMEDIATE MEDICAL CARE IF:  Any of the following symptoms occur over the next 12 hours:  Shaking chills.  You have a temperature by mouth above 102 F (38.9 C), not controlled by medicine.  Chest, back, or muscle pain.  People around you feel you are not acting correctly or are confused.  Shortness of breath or difficulty breathing.  Dizziness and fainting.  You get a rash or develop hives.  You have a decrease in urine output.  Your urine turns a dark color or changes to pink, red, or brown. Any of the following symptoms occur over the next 10 days:  You have a temperature by mouth above 102 F (38.9 C), not  controlled by medicine.  Shortness of breath.  Weakness after normal activity.  The white part of the eye turns yellow (jaundice).  You have a decrease in the amount of urine or are urinating less often.  Your urine turns a dark color or changes to pink, red, or brown. Document Released: 08/28/2000 Document Revised: 11/23/2011 Document Reviewed: 04/16/2008 [facility] Patient Information 2014 [place], [place].  _______________________________________________________________________

## 2015-03-15 NOTE — Progress Notes (Addendum)
Clearance note per Dr Gerarda Fraction on chart 11/15/2014  Stress test in epic 01/12/2013

## 2015-03-15 NOTE — Progress Notes (Signed)
Your patient has screened at an elevated risk for Obstructive Sleep Apnea using the Stop-Bang Tool during a pre-surgical vist. A score of 4 or greater is an elevated risk. Score of 4. 

## 2015-03-17 NOTE — Anesthesia Preprocedure Evaluation (Addendum)
Anesthesia Evaluation  Patient identified by MRN, date of birth, ID band Patient awake    Reviewed: Allergy & Precautions, NPO status , Patient's Chart, lab work & pertinent test results, reviewed documented beta blocker date and time   Airway Mallampati: II   Neck ROM: Full    Dental  (+) Teeth Intact, Dental Advisory Given   Pulmonary former smoker (quit 2007),  breath sounds clear to auscultation        Cardiovascular Rhythm:Regular  Low Risk Stress 2014, EF 61%   Neuro/Psych    GI/Hepatic GERD-  Medicated,  Endo/Other    Renal/GU      Musculoskeletal   Abdominal (+)  Abdomen: soft.    Peds  Hematology 14/42, plts 246   Anesthesia Other Findings   Reproductive/Obstetrics                            Anesthesia Physical Anesthesia Plan  ASA: II  Anesthesia Plan: Spinal   Post-op Pain Management:    Induction:   Airway Management Planned: Nasal Cannula  Additional Equipment:   Intra-op Plan:   Post-operative Plan:   Informed Consent: I have reviewed the patients History and Physical, chart, labs and discussed the procedure including the risks, benefits and alternatives for the proposed anesthesia with the patient or authorized representative who has indicated his/her understanding and acceptance.     Plan Discussed with:   Anesthesia Plan Comments: (Had spinal 2015)        Anesthesia Quick Evaluation

## 2015-03-19 MED ORDER — MIDAZOLAM HCL 2 MG/2ML IJ SOLN
INTRAMUSCULAR | Status: AC
  Filled 2015-03-19: qty 2

## 2015-03-19 MED ORDER — FENTANYL CITRATE (PF) 100 MCG/2ML IJ SOLN
INTRAMUSCULAR | Status: AC
  Filled 2015-03-19: qty 4

## 2015-03-20 ENCOUNTER — Encounter (HOSPITAL_COMMUNITY)
Admission: RE | Disposition: A | Discharge status: Home Health | Payer: Self-pay | Source: Ambulatory Visit | Attending: Orthopedic Surgery

## 2015-03-20 ENCOUNTER — Inpatient Hospital Stay (HOSPITAL_COMMUNITY): Payer: BC Managed Care – PPO

## 2015-03-20 ENCOUNTER — Inpatient Hospital Stay (HOSPITAL_COMMUNITY): Payer: BC Managed Care – PPO | Admitting: Anesthesiology

## 2015-03-20 ENCOUNTER — Encounter (HOSPITAL_COMMUNITY): Payer: Self-pay | Admitting: *Deleted

## 2015-03-20 ENCOUNTER — Inpatient Hospital Stay (HOSPITAL_COMMUNITY)
Admission: RE | Admit: 2015-03-20 | Discharge: 2015-03-21 | DRG: 470 | Disposition: A | Discharge status: Home Health | Payer: BC Managed Care – PPO | Source: Ambulatory Visit | Attending: Orthopedic Surgery | Admitting: Orthopedic Surgery

## 2015-03-20 DIAGNOSIS — Z96653 Presence of artificial knee joint, bilateral: Secondary | ICD-10-CM | POA: Diagnosis present

## 2015-03-20 DIAGNOSIS — K219 Gastro-esophageal reflux disease without esophagitis: Secondary | ICD-10-CM | POA: Diagnosis present

## 2015-03-20 DIAGNOSIS — M87852 Other osteonecrosis, left femur: Secondary | ICD-10-CM | POA: Diagnosis present

## 2015-03-20 DIAGNOSIS — Z888 Allergy status to other drugs, medicaments and biological substances status: Secondary | ICD-10-CM | POA: Diagnosis not present

## 2015-03-20 DIAGNOSIS — Z79899 Other long term (current) drug therapy: Secondary | ICD-10-CM | POA: Diagnosis not present

## 2015-03-20 DIAGNOSIS — G629 Polyneuropathy, unspecified: Secondary | ICD-10-CM | POA: Diagnosis present

## 2015-03-20 DIAGNOSIS — F419 Anxiety disorder, unspecified: Secondary | ICD-10-CM | POA: Diagnosis present

## 2015-03-20 DIAGNOSIS — M1612 Unilateral primary osteoarthritis, left hip: Secondary | ICD-10-CM | POA: Diagnosis present

## 2015-03-20 DIAGNOSIS — E780 Pure hypercholesterolemia: Secondary | ICD-10-CM | POA: Diagnosis present

## 2015-03-20 DIAGNOSIS — M169 Osteoarthritis of hip, unspecified: Secondary | ICD-10-CM | POA: Diagnosis present

## 2015-03-20 DIAGNOSIS — Z87891 Personal history of nicotine dependence: Secondary | ICD-10-CM | POA: Diagnosis not present

## 2015-03-20 DIAGNOSIS — Z1509 Genetic susceptibility to other malignant neoplasm: Secondary | ICD-10-CM | POA: Diagnosis not present

## 2015-03-20 DIAGNOSIS — Z96649 Presence of unspecified artificial hip joint: Secondary | ICD-10-CM

## 2015-03-20 HISTORY — PX: TOTAL HIP ARTHROPLASTY: SHX124

## 2015-03-20 LAB — TYPE AND SCREEN
ABO/RH(D): O POS
Antibody Screen: NEGATIVE

## 2015-03-20 SURGERY — ARTHROPLASTY, HIP, TOTAL, ANTERIOR APPROACH
Anesthesia: Spinal | Site: Hip | Laterality: Left

## 2015-03-20 MED ORDER — DIPHENHYDRAMINE HCL 12.5 MG/5ML PO ELIX
12.5000 mg | ORAL_SOLUTION | ORAL | Status: DC | PRN
  Administered 2015-03-21: 25 mg via ORAL
  Filled 2015-03-20: qty 10

## 2015-03-20 MED ORDER — METHOCARBAMOL 500 MG PO TABS
500.0000 mg | ORAL_TABLET | Freq: Four times a day (QID) | ORAL | Status: DC | PRN
  Administered 2015-03-21 (×2): 500 mg via ORAL
  Filled 2015-03-20 (×2): qty 1

## 2015-03-20 MED ORDER — PROPOFOL 10 MG/ML IV BOLUS
INTRAVENOUS | Status: AC
  Filled 2015-03-20: qty 20

## 2015-03-20 MED ORDER — BUPIVACAINE IN DEXTROSE 0.75-8.25 % IT SOLN
INTRATHECAL | Status: DC | PRN
  Administered 2015-03-20: 2 mL via INTRATHECAL

## 2015-03-20 MED ORDER — PROPOFOL INFUSION 10 MG/ML OPTIME
INTRAVENOUS | Status: DC | PRN
  Administered 2015-03-20: 100 ug/kg/min via INTRAVENOUS

## 2015-03-20 MED ORDER — MENTHOL 3 MG MT LOZG: 1.0000 | LOZENGE | OROMUCOSAL | Status: DC | PRN

## 2015-03-20 MED ORDER — ONDANSETRON HCL 4 MG/2ML IJ SOLN: 4.0000 mg | Freq: Four times a day (QID) | INTRAMUSCULAR | Status: DC | PRN

## 2015-03-20 MED ORDER — MORPHINE SULFATE 10 MG/ML IJ SOLN: 1.0000 mg | INTRAMUSCULAR | Status: DC | PRN

## 2015-03-20 MED ORDER — 0.9 % SODIUM CHLORIDE (POUR BTL) OPTIME
TOPICAL | Status: DC | PRN
  Administered 2015-03-20: 1000 mL

## 2015-03-20 MED ORDER — CEFAZOLIN SODIUM-DEXTROSE 2-3 GM-% IV SOLR
2.0000 g | INTRAVENOUS | Status: AC
  Administered 2015-03-20: 2 g via INTRAVENOUS

## 2015-03-20 MED ORDER — ACETAMINOPHEN 325 MG PO TABS: 650.0000 mg | ORAL_TABLET | Freq: Four times a day (QID) | ORAL | Status: DC | PRN

## 2015-03-20 MED ORDER — LORATADINE 10 MG PO TABS
10.0000 mg | ORAL_TABLET | Freq: Every day | ORAL | Status: DC
  Administered 2015-03-21: 10 mg via ORAL
  Filled 2015-03-20: qty 1

## 2015-03-20 MED ORDER — FLEET ENEMA 7-19 GM/118ML RE ENEM: 1.0000 | ENEMA | Freq: Once | RECTAL | Status: AC | PRN

## 2015-03-20 MED ORDER — PROPOFOL 10 MG/ML IV BOLUS
INTRAVENOUS | Status: DC | PRN
  Administered 2015-03-20: 20 mg via INTRAVENOUS
  Administered 2015-03-20: 30 mg via INTRAVENOUS
  Administered 2015-03-20: 50 mg via INTRAVENOUS

## 2015-03-20 MED ORDER — CHLORHEXIDINE GLUCONATE 4 % EX LIQD: 60.0000 mL | Freq: Once | CUTANEOUS | Status: DC

## 2015-03-20 MED ORDER — CEFAZOLIN SODIUM-DEXTROSE 2-3 GM-% IV SOLR
2.0000 g | Freq: Four times a day (QID) | INTRAVENOUS | Status: AC
  Administered 2015-03-20 – 2015-03-21 (×2): 2 g via INTRAVENOUS
  Filled 2015-03-20 (×2): qty 50

## 2015-03-20 MED ORDER — DEXAMETHASONE SODIUM PHOSPHATE 10 MG/ML IJ SOLN
10.0000 mg | Freq: Once | INTRAMUSCULAR | Status: AC
  Administered 2015-03-21: 10 mg via INTRAVENOUS
  Filled 2015-03-20: qty 1

## 2015-03-20 MED ORDER — SIMVASTATIN 20 MG PO TABS
20.0000 mg | ORAL_TABLET | Freq: Every morning | ORAL | Status: DC
  Administered 2015-03-21: 20 mg via ORAL
  Filled 2015-03-20: qty 1

## 2015-03-20 MED ORDER — PHENOL 1.4 % MT LIQD: 1.0000 | OROMUCOSAL | Status: DC | PRN

## 2015-03-20 MED ORDER — BUPIVACAINE HCL (PF) 0.25 % IJ SOLN
INTRAMUSCULAR | Status: AC
  Filled 2015-03-20: qty 30

## 2015-03-20 MED ORDER — TRAMADOL HCL 50 MG PO TABS
50.0000 mg | ORAL_TABLET | Freq: Four times a day (QID) | ORAL | Status: DC | PRN
  Administered 2015-03-21: 100 mg via ORAL
  Filled 2015-03-20: qty 2

## 2015-03-20 MED ORDER — BISACODYL 10 MG RE SUPP
10.0000 mg | Freq: Every day | RECTAL | Status: DC | PRN
Start: 2015-03-20 — End: 2015-03-21

## 2015-03-20 MED ORDER — FENTANYL CITRATE (PF) 100 MCG/2ML IJ SOLN
25.0000 ug | INTRAMUSCULAR | Status: DC | PRN
  Administered 2015-03-20 (×4): 50 ug via INTRAVENOUS

## 2015-03-20 MED ORDER — POLYETHYLENE GLYCOL 3350 17 G PO PACK
17.0000 g | PACK | Freq: Every day | ORAL | Status: DC | PRN
  Administered 2015-03-21: 17 g via ORAL
  Filled 2015-03-20: qty 1

## 2015-03-20 MED ORDER — FENTANYL CITRATE (PF) 100 MCG/2ML IJ SOLN
INTRAMUSCULAR | Status: AC
  Filled 2015-03-20: qty 2

## 2015-03-20 MED ORDER — LACTATED RINGERS IV SOLN
INTRAVENOUS | Status: DC
  Administered 2015-03-20: 15:00:00 via INTRAVENOUS
  Administered 2015-03-20: 1000 mL via INTRAVENOUS

## 2015-03-20 MED ORDER — ACETAMINOPHEN 10 MG/ML IV SOLN
INTRAVENOUS | Status: AC
  Filled 2015-03-20: qty 100

## 2015-03-20 MED ORDER — FENTANYL CITRATE (PF) 100 MCG/2ML IJ SOLN
INTRAMUSCULAR | Status: DC | PRN
  Administered 2015-03-20 (×2): 50 ug via INTRAVENOUS

## 2015-03-20 MED ORDER — METOCLOPRAMIDE HCL 5 MG/ML IJ SOLN: 5.0000 mg | Freq: Three times a day (TID) | INTRAMUSCULAR | Status: DC | PRN

## 2015-03-20 MED ORDER — ACETAMINOPHEN 10 MG/ML IV SOLN
1000.0000 mg | Freq: Once | INTRAVENOUS | Status: AC
  Administered 2015-03-20: 1000 mg via INTRAVENOUS

## 2015-03-20 MED ORDER — ALPRAZOLAM 1 MG PO TABS
1.0000 mg | ORAL_TABLET | Freq: Four times a day (QID) | ORAL | Status: DC | PRN
  Administered 2015-03-20 – 2015-03-21 (×2): 1 mg via ORAL
  Filled 2015-03-20 (×2): qty 1

## 2015-03-20 MED ORDER — BUPIVACAINE HCL (PF) 0.25 % IJ SOLN
INTRAMUSCULAR | Status: DC | PRN
  Administered 2015-03-20: 30 mL via INTRA_ARTICULAR

## 2015-03-20 MED ORDER — METHOCARBAMOL 1000 MG/10ML IJ SOLN
500.0000 mg | Freq: Four times a day (QID) | INTRAVENOUS | Status: DC | PRN
  Administered 2015-03-20: 500 mg via INTRAVENOUS
  Filled 2015-03-20 (×2): qty 5

## 2015-03-20 MED ORDER — ONDANSETRON HCL 4 MG PO TABS: 4.0000 mg | ORAL_TABLET | Freq: Four times a day (QID) | ORAL | Status: DC | PRN

## 2015-03-20 MED ORDER — PANTOPRAZOLE SODIUM 40 MG PO TBEC
40.0000 mg | DELAYED_RELEASE_TABLET | Freq: Every day | ORAL | Status: DC
  Administered 2015-03-21: 40 mg via ORAL
  Filled 2015-03-20 (×2): qty 1

## 2015-03-20 MED ORDER — CEFAZOLIN SODIUM-DEXTROSE 2-3 GM-% IV SOLR
INTRAVENOUS | Status: AC
  Filled 2015-03-20: qty 50

## 2015-03-20 MED ORDER — DEXAMETHASONE SODIUM PHOSPHATE 10 MG/ML IJ SOLN
INTRAMUSCULAR | Status: AC
  Filled 2015-03-20: qty 1

## 2015-03-20 MED ORDER — RIVAROXABAN 10 MG PO TABS
10.0000 mg | ORAL_TABLET | Freq: Every day | ORAL | Status: DC
  Administered 2015-03-21: 10 mg via ORAL
  Filled 2015-03-20 (×2): qty 1

## 2015-03-20 MED ORDER — ACETAMINOPHEN 650 MG RE SUPP: 650.0000 mg | Freq: Four times a day (QID) | RECTAL | Status: DC | PRN

## 2015-03-20 MED ORDER — MIDAZOLAM HCL 5 MG/5ML IJ SOLN
INTRAMUSCULAR | Status: DC | PRN
  Administered 2015-03-20: 2 mg via INTRAVENOUS

## 2015-03-20 MED ORDER — MIDAZOLAM HCL 2 MG/2ML IJ SOLN
INTRAMUSCULAR | Status: AC
  Filled 2015-03-20: qty 2

## 2015-03-20 MED ORDER — METOCLOPRAMIDE HCL 5 MG PO TABS
5.0000 mg | ORAL_TABLET | Freq: Three times a day (TID) | ORAL | Status: DC | PRN
  Filled 2015-03-20: qty 2

## 2015-03-20 MED ORDER — ACETAMINOPHEN 500 MG PO TABS
1000.0000 mg | ORAL_TABLET | Freq: Four times a day (QID) | ORAL | Status: AC
  Administered 2015-03-20 – 2015-03-21 (×4): 1000 mg via ORAL
  Filled 2015-03-20 (×4): qty 2

## 2015-03-20 MED ORDER — OXYCODONE HCL 5 MG PO TABS
10.0000 mg | ORAL_TABLET | ORAL | Status: DC | PRN
  Administered 2015-03-20 – 2015-03-21 (×4): 20 mg via ORAL
  Filled 2015-03-20 (×4): qty 4

## 2015-03-20 MED ORDER — SODIUM CHLORIDE 0.9 % IV SOLN: INTRAVENOUS | Status: DC

## 2015-03-20 MED ORDER — PROMETHAZINE HCL 25 MG/ML IJ SOLN: 6.2500 mg | INTRAMUSCULAR | Status: DC | PRN

## 2015-03-20 MED ORDER — DEXAMETHASONE SODIUM PHOSPHATE 10 MG/ML IJ SOLN
10.0000 mg | Freq: Once | INTRAMUSCULAR | Status: AC
  Administered 2015-03-20: 10 mg via INTRAVENOUS

## 2015-03-20 MED ORDER — DOCUSATE SODIUM 100 MG PO CAPS
100.0000 mg | ORAL_CAPSULE | Freq: Two times a day (BID) | ORAL | Status: DC
  Administered 2015-03-20 – 2015-03-21 (×2): 100 mg via ORAL

## 2015-03-20 MED ORDER — SODIUM CHLORIDE 0.9 % IV SOLN
1000.0000 mg | INTRAVENOUS | Status: AC
  Administered 2015-03-20: 1000 mg via INTRAVENOUS
  Filled 2015-03-20: qty 10

## 2015-03-20 MED ORDER — MORPHINE SULFATE 2 MG/ML IJ SOLN
1.0000 mg | INTRAMUSCULAR | Status: DC | PRN
  Administered 2015-03-20 – 2015-03-21 (×3): 2 mg via INTRAVENOUS
  Filled 2015-03-20 (×3): qty 1

## 2015-03-20 MED ORDER — KCL IN DEXTROSE-NACL 20-5-0.9 MEQ/L-%-% IV SOLN
INTRAVENOUS | Status: DC
  Administered 2015-03-21: 01:00:00 via INTRAVENOUS
  Filled 2015-03-20 (×4): qty 1000

## 2015-03-20 MED ORDER — MEPERIDINE HCL 50 MG/ML IJ SOLN: 6.2500 mg | INTRAMUSCULAR | Status: DC | PRN

## 2015-03-20 SURGICAL SUPPLY — 43 items
BAG DECANTER FOR FLEXI CONT (MISCELLANEOUS) ×1 IMPLANT
BAG SPEC THK2 15X12 ZIP CLS (MISCELLANEOUS)
BAG ZIPLOCK 12X15 (MISCELLANEOUS) IMPLANT
BLADE EXTENDED COATED 6.5IN (ELECTRODE) ×2 IMPLANT
BLADE SAG 18X100X1.27 (BLADE) ×2 IMPLANT
CAPT HIP TOTAL 2 ×1 IMPLANT
COVER PERINEAL POST (MISCELLANEOUS) ×2 IMPLANT
DECANTER SPIKE VIAL GLASS SM (MISCELLANEOUS) ×2 IMPLANT
DRAPE C-ARM 42X120 X-RAY (DRAPES) ×2 IMPLANT
DRAPE STERI IOBAN 125X83 (DRAPES) ×2 IMPLANT
DRAPE U-SHAPE 47X51 STRL (DRAPES) ×6 IMPLANT
DRSG ADAPTIC 3X8 NADH LF (GAUZE/BANDAGES/DRESSINGS) ×2 IMPLANT
DRSG MEPILEX BORDER 4X4 (GAUZE/BANDAGES/DRESSINGS) ×2 IMPLANT
DRSG MEPILEX BORDER 4X8 (GAUZE/BANDAGES/DRESSINGS) ×2 IMPLANT
DURAPREP 26ML APPLICATOR (WOUND CARE) ×2 IMPLANT
ELECT REM PT RETURN 9FT ADLT (ELECTROSURGICAL) ×2
ELECTRODE REM PT RTRN 9FT ADLT (ELECTROSURGICAL) ×1 IMPLANT
EVACUATOR 1/8 PVC DRAIN (DRAIN) ×2 IMPLANT
FACESHIELD WRAPAROUND (MASK) ×8 IMPLANT
FACESHIELD WRAPAROUND OR TEAM (MASK) ×4 IMPLANT
GLOVE BIO SURGEON STRL SZ7.5 (GLOVE) ×2 IMPLANT
GLOVE BIO SURGEON STRL SZ8 (GLOVE) ×3 IMPLANT
GLOVE BIOGEL PI IND STRL 8 (GLOVE) ×2 IMPLANT
GLOVE BIOGEL PI INDICATOR 8 (GLOVE) ×2
GOWN STRL REUS W/TWL LRG LVL3 (GOWN DISPOSABLE) ×2 IMPLANT
GOWN STRL REUS W/TWL XL LVL3 (GOWN DISPOSABLE) ×2 IMPLANT
KIT BASIN OR (CUSTOM PROCEDURE TRAY) ×2 IMPLANT
NDL SAFETY ECLIPSE 18X1.5 (NEEDLE) ×1 IMPLANT
NEEDLE HYPO 18GX1.5 SHARP (NEEDLE) ×2
PACK TOTAL JOINT (CUSTOM PROCEDURE TRAY) ×2 IMPLANT
PEN SKIN MARKING BROAD (MISCELLANEOUS) ×2 IMPLANT
STRIP CLOSURE SKIN 1/2X4 (GAUZE/BANDAGES/DRESSINGS) ×2 IMPLANT
SUT ETHIBOND NAB CT1 #1 30IN (SUTURE) ×2 IMPLANT
SUT MNCRL AB 4-0 PS2 18 (SUTURE) ×2 IMPLANT
SUT VIC AB 2-0 CT1 27 (SUTURE) ×4
SUT VIC AB 2-0 CT1 TAPERPNT 27 (SUTURE) ×2 IMPLANT
SUT VLOC 180 0 24IN GS25 (SUTURE) ×2 IMPLANT
SYR 30ML LL (SYRINGE) ×2 IMPLANT
SYR 50ML LL SCALE MARK (SYRINGE) IMPLANT
TOWEL OR 17X26 10 PK STRL BLUE (TOWEL DISPOSABLE) ×2 IMPLANT
TOWEL OR NON WOVEN STRL DISP B (DISPOSABLE) ×1 IMPLANT
TRAY FOLEY CATH 16FRSI W/METER (SET/KITS/TRAYS/PACK) ×1 IMPLANT
TRAY FOLEY W/METER SILVER 14FR (SET/KITS/TRAYS/PACK) ×1 IMPLANT

## 2015-03-20 NOTE — Anesthesia Procedure Notes (Signed)
Spinal Patient location during procedure: OR Start time: 03/20/2015 1:22 PM End time: 03/20/2015 1:28 PM Reason for block: at surgeon's request Staffing Resident/CRNA: Anne Fu Performed by: resident/CRNA  Preanesthetic Checklist Completed: patient identified, site marked, surgical consent, pre-op evaluation, timeout performed, IV checked, risks and benefits discussed, monitors and equipment checked and at surgeon's request Spinal Block Patient position: sitting Approach: right paramedian Location: L2-3 Injection technique: single-shot Needle Needle type: Sprotte  Needle gauge: 24 G Needle length: 9 cm Assessment Sensory level: T6 Additional Notes Expiration date of kit checked and confirmed. Patient tolerated procedure well, without complications. X 1 attempt with noted clear CSF return. Loss of motor and sensory on exam post injection.

## 2015-03-20 NOTE — Interval H&P Note (Signed)
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 After consideration of risks, benefits and other options for treatment, the patient has consented to  Procedure(s): LEFT TOTAL HIP ARTHROPLASTY ANTERIOR APPROACH (Left) as a surgical intervention .  The patient's history has been reviewed, patient examined, no change in status, stable for surgery.  I have reviewed the patient's chart and labs.  Questions were answered to the patient's satisfaction.     [name]

## 2015-03-20 NOTE — Transfer of Care (Signed)
Immediate Anesthesia Transfer of Care Note  Patient: Craig Scott  Procedure(s) Performed: Procedure(s): LEFT TOTAL HIP ARTHROPLASTY ANTERIOR APPROACH (Left)  Patient Location: PACU  Anesthesia Type:Spinal  Level of Consciousness:  sedated, patient cooperative and responds to stimulation  Airway & Oxygen Therapy:Patient Spontanous Breathing and Patient connected to face mask oxgen  Post-op Assessment:  Report given to PACU RN and Post -op Vital signs reviewed and stable  Post vital signs:  Reviewed and stable  Last Vitals:  Filed Vitals:   03/20/15 1550  BP:   Pulse: 75  Temp:   Resp:     Complications: No apparent anesthesia complications, S-1 level on exam stated slight discomforted to back fentanyl given as documented.

## 2015-03-20 NOTE — Anesthesia Postprocedure Evaluation (Signed)
  Anesthesia Post-op Note  Patient: Craig Scott  Procedure(s) Performed: Procedure(s): LEFT TOTAL HIP ARTHROPLASTY ANTERIOR APPROACH (Left)  Patient Location: PACU  Anesthesia Type: Spinal/MAC  Level of Consciousness: awake and alert   Airway and Oxygen Therapy: Patient Spontanous Breathing  Post-op Pain: Controlled  Post-op Assessment: Post-op Vital signs reviewed, Patient's Cardiovascular Status Stable and Respiratory Function Stable. Block receeding.  Post-op Vital Signs: Reviewed  Filed Vitals:   03/20/15 1645  BP: 113/63  Pulse: 76  Temp:   Resp: 14    Complications: No apparent anesthesia complications

## 2015-03-20 NOTE — Op Note (Signed)
OPERATIVE REPORT  PREOPERATIVE DIAGNOSIS: Osteoarthritis/AVN of the Left hip.   POSTOPERATIVE DIAGNOSIS: Osteoarthritis/AVN of the Left  hip.   PROCEDURE: Left total hip arthroplasty, anterior approach.   SURGEON: Gaynelle Arabian, MD   ASSISTANT: Arlee Muslim, PA-C  ANESTHESIA:  Spinal  ESTIMATED BLOOD LOSS:- 350 ml    DRAINS: Hemovac x1.   COMPLICATIONS: None   CONDITION: PACU - hemodynamically stable.   BRIEF CLINICAL NOTE: Craig Scott is a 59 y.o. male who has advanced end-  stage arthritis of his Left  hip with progressively worsening pain and  dysfunction.The patient has failed nonoperative management and presents for  total hip arthroplasty.   PROCEDURE IN DETAIL: After successful administration of spinal  anesthetic, the traction boots for the [facility] bed were placed on both  feet and the patient was placed onto the [facility] bed, boots placed into the leg  holders. The Left hip was then isolated from the perineum with plastic  drapes and prepped and draped in the usual sterile fashion. ASIS and  greater trochanter were marked and a oblique incision was made, starting  at about 1 cm lateral and 2 cm distal to the ASIS and coursing towards  the anterior cortex of the femur. The skin was cut with a 10 blade  through subcutaneous tissue to the level of the fascia overlying the  tensor fascia lata muscle. The fascia was then incised in line with the  incision at the junction of the anterior third and posterior 2/3rd. The  muscle was teased off the fascia and then the interval between the TFL  and the rectus was developed. The Hohmann retractor was then placed at  the top of the femoral neck over the capsule. The vessels overlying the  capsule were cauterized and the fat on top of the capsule was removed.  A Hohmann retractor was then placed anterior underneath the rectus  femoris to give exposure to the entire anterior capsule. A T-shaped  capsulotomy was  performed. The edges were tagged and the femoral head  was identified.       Osteophytes are removed off the superior acetabulum.  The femoral neck was then cut in situ with an oscillating saw. Traction  was then applied to the left lower extremity utilizing the [facility]  traction. The femoral head was then removed. Retractors were placed  around the acetabulum and then circumferential removal of the labrum was  performed. Osteophytes were also removed. Reaming starts at 47 mm to  medialize and  Increased in 2 mm increments to 53 mm. We reamed in  approximately 40 degrees of abduction, 20 degrees anteversion. A 54 mm  pinnacle acetabular shell was then impacted in anatomic position under  fluoroscopic guidance with excellent purchase. We did not need to place  any additional dome screws. A 36 mm neutral + 4 marathon liner was then  placed into the acetabular shell.       The femoral lift was then placed along the lateral aspect of the femur  just distal to the vastus ridge. The leg was  externally rotated and capsule  was stripped off the inferior aspect of the femoral neck down to the  level of the lesser trochanter, this was done with electrocautery. The femur was lifted after this was performed. The  leg was then placed and extended in adducted position to essentially delivering the femur. We also removed the capsule superiorly and the  piriformis from  the piriformis fossa to gain excellent exposure of the  proximal femur. Rongeur was used to remove some cancellous bone to get  into the lateral portion of the proximal femur for placement of the  initial starter reamer. The starter broaches was placed  the starter broach  and was shown to go down the center of the canal. Broaching  with the  Corail system was then performed starting at size 8, coursing  Up to size 12. A size 12 had excellent torsional and rotational  and axial stability. The trial standard offset neck was then placed  with a  36 + 5 trial head. The hip was then reduced. We confirmed that  the stem was in the canal both on AP and lateral x-rays. It also has excellent sizing. The hip was reduced with outstanding stability through full extension, full external rotation,  and then flexion in adduction internal rotation. AP pelvis was taken  and the leg lengths were measured and found to be exactly equal. Hip  was then dislocated again and the femoral head and neck removed. The  femoral broach was removed. Size 12 Corail stem with a standard offset  neck was then impacted into the femur following native anteversion. Has  excellent purchase in the canal. Excellent torsional and rotational and  axial stability. It is confirmed to be in the canal on AP and lateral  fluoroscopic views. The 36 + 5 ceramic head was placed and the hip  reduced with outstanding stability. Again AP pelvis was taken and it  confirmed that the leg lengths were equal. The wound was then copiously  irrigated with saline solution and the capsule reattached and repaired  with Ethibond suture.  30 ml of .25% Bupivicaine injected into the capsule and into the edge of the tensor fascia lata as well as subcutaneous tissue. The fascia overlying the tensor fascia lata was  then closed with a running #1 V-Loc. Subcu was closed with interrupted  2-0 Vicryl and subcuticular running 4-0 Monocryl. Incision was cleaned  and dried. Steri-Strips and a bulky sterile dressing applied. Hemovac  drain was hooked to suction and then he was awakened and transported to  recovery in stable condition.        Please note that a surgical assistant was a medical necessity for this procedure to perform it in a safe and expeditious manner. Assistant was necessary to provide appropriate retraction of vital neurovascular structures and to prevent femoral fracture and allow for anatomic placement of the prosthesis.  Gaynelle Arabian, M.D.

## 2015-03-21 ENCOUNTER — Encounter (HOSPITAL_COMMUNITY): Payer: Self-pay | Admitting: Orthopedic Surgery

## 2015-03-21 LAB — BASIC METABOLIC PANEL
Anion gap: 7 (ref 5–15)
BUN: 11 mg/dL (ref 6–20)
CO2: 26 mmol/L (ref 22–32)
Calcium: 8.6 mg/dL — ABNORMAL LOW (ref 8.9–10.3)
Chloride: 100 mmol/L — ABNORMAL LOW (ref 101–111)
Creatinine, Ser: 0.75 mg/dL (ref 0.61–1.24)
GFR calc Af Amer: >60 mL/min (ref >60)
Glucose, Bld: 182 mg/dL — ABNORMAL HIGH (ref 65–99)
Potassium: 4 mmol/L (ref 3.5–5.1)
Sodium: 133 mmol/L — ABNORMAL LOW (ref 135–145)

## 2015-03-21 LAB — CBC
HEMATOCRIT: 36.9 % — AB (ref 39.0–52.0)
HEMOGLOBIN: 12.4 g/dL — AB (ref 13.0–17.0)
MCH: 29.2 pg (ref 26.0–34.0)
MCHC: 33.6 g/dL (ref 30.0–36.0)
MCV: 86.8 fL (ref 78.0–100.0)
Platelets: 194 10*3/uL (ref 150–400)
RBC: 4.25 MIL/uL (ref 4.22–5.81)
RDW: 12.2 % (ref 11.5–15.5)
WBC: 11.9 10*3/uL — ABNORMAL HIGH (ref 4.0–10.5)

## 2015-03-21 MED ORDER — TRAMADOL HCL 50 MG PO TABS: 50.0000 mg | ORAL_TABLET | Freq: Four times a day (QID) | ORAL | Status: DC | PRN

## 2015-03-21 MED ORDER — OXYCODONE HCL 10 MG PO TABS: 10.0000 mg | ORAL_TABLET | ORAL | Status: DC | PRN

## 2015-03-21 MED ORDER — METHOCARBAMOL 500 MG PO TABS: 500.0000 mg | ORAL_TABLET | Freq: Four times a day (QID) | ORAL | Status: DC | PRN

## 2015-03-21 MED ORDER — RIVAROXABAN 10 MG PO TABS: 10.0000 mg | ORAL_TABLET | Freq: Every day | ORAL | Status: DC

## 2015-03-21 NOTE — Progress Notes (Signed)
   Subjective: 1 Day Post-Op Procedure(s) (LRB): LEFT TOTAL HIP ARTHROPLASTY ANTERIOR APPROACH (Left) Patient reports no pain at this time.  Patient seen in rounds with Dr. Wynelle Link.  Family in room at bedside. Patient is doing well following surgery.  Already has been up walking in room with staff last night.   Patient is ready to go home as long as he does well with both sessions of therapy.  Objective: Vital signs in last 24 hours: Temp:  [97.8 F (36.6 C)-98.3 F (36.8 C)] 98.2 F (36.8 C) (07/07 0600) Pulse Rate:  [66-103] 69 (07/07 0600) Resp:  [9-18] 14 (07/07 0600) BP: (99-147)/(52-88) 117/58 mmHg (07/07 0600) SpO2:  [96 %-100 %] 99 % (07/07 0600) Weight:  [111.131 kg (245 lb)] 111.131 kg (245 lb) (07/06 1130)  Intake/Output from previous day:  Intake/Output Summary (Last 24 hours) at 03/21/15 0807 Last data filed at 03/21/15 7510  Gross per 24 hour  Intake 2596.5 ml  Output   4005 ml  Net -1408.5 ml    Intake/Output this shift: UOP about 2300 since around [place]  Labs:  Recent Labs  03/21/15 0415  HGB 12.4*    Recent Labs  03/21/15 0415  WBC 11.9*  RBC 4.25  HCT 36.9*  PLT 194    Recent Labs  03/21/15 0415  NA 133*  K 4.0  CL 100*  CO2 26  BUN 11  CREATININE 0.75  GLUCOSE 182*  CALCIUM 8.6*   No results for input(s): LABPT, INR in the last 72 hours.  EXAM: General - Patient is Alert, Appropriate and Oriented Extremity - Neurovascular intact Sensation intact distally Dorsiflexion/Plantar flexion intact Incision - clean, dry, no drainage Motor Function - intact, moving foot and toes well on exam.   Assessment/Plan: 1 Day Post-Op Procedure(s) (LRB): LEFT TOTAL HIP ARTHROPLASTY ANTERIOR APPROACH (Left) Procedure(s) (LRB): LEFT TOTAL HIP ARTHROPLASTY ANTERIOR APPROACH (Left) Past Medical History  Diagnosis Date  . High cholesterol   . GERD (gastroesophageal reflux disease)   . Anxiety   . Neuropathy     both feet  . Arthritis     ra    . Lynch syndrome     has gene that causes cancer   Principal Problem:   OA (osteoarthritis) of hip  Estimated body mass index is 33.22 kg/(m^2) as calculated from the following:   Height as of this encounter: 6' (1.829 m).   Weight as of this encounter: 111.131 kg (245 lb). Up with therapy Discharge home with home health Diet - Cardiac diet Follow up - in 2 weeks Activity - WBAT Disposition - Home Condition Upon Discharge - Good D/C Meds - See DC Summary DVT Prophylaxis - Xarelto  Arlee Muslim, PA-C Orthopaedic Surgery 03/21/2015, 8:07 AM

## 2015-03-21 NOTE — Discharge Summary (Signed)
Physician Discharge Summary   Patient ID: Craig Scott MRN: [id number] DOB/AGE: 1955-10-13 59 y.o.  Admit date: 03/20/2015 Discharge date: 03/21/2015  Primary Diagnosis:  Osteoarthritis/AVN of the Left hip.  Admission Diagnoses:  Past Medical History  Diagnosis Date  . High cholesterol   . GERD (gastroesophageal reflux disease)   . Anxiety   . Neuropathy     both feet  . Arthritis     ra  . Lynch syndrome     has gene that causes cancer   Discharge Diagnoses:   Principal Problem:   OA (osteoarthritis) of hip  Estimated body mass index is 33.22 kg/(m^2) as calculated from the following:   Height as of this encounter: 6' (1.829 m).   Weight as of this encounter: 111.131 kg (245 lb).  Procedure(s) (LRB): LEFT TOTAL HIP ARTHROPLASTY ANTERIOR APPROACH (Left)   Consults: None  HPI: Craig Scott is a 59 y.o. male who has advanced end-  stage arthritis of his Left hip with progressively worsening pain and  dysfunction.The patient has failed nonoperative management and presents for  total hip arthroplasty.   Laboratory Data: Admission on 03/20/2015  Component Date Value Ref Range Status  . ABO/RH(D) 03/20/2015 O POS   Final  . Antibody Screen 03/20/2015 NEG   Final  . Sample Expiration 03/20/2015 03/23/2015   Final  . WBC 03/21/2015 11.9* 4.0 - 10.5 K/uL Final  . RBC 03/21/2015 4.25  4.22 - 5.81 MIL/uL Final  . Hemoglobin 03/21/2015 12.4* 13.0 - 17.0 g/dL Final  . HCT 03/21/2015 36.9* 39.0 - 52.0 % Final  . MCV 03/21/2015 86.8  78.0 - 100.0 fL Final  . MCH 03/21/2015 29.2  26.0 - 34.0 pg Final  . MCHC 03/21/2015 33.6  30.0 - 36.0 g/dL Final  . RDW 03/21/2015 12.2  11.5 - 15.5 % Final  . Platelets 03/21/2015 194  150 - 400 K/uL Final  . Sodium 03/21/2015 133* 135 - 145 mmol/L Final  . Potassium 03/21/2015 4.0  3.5 - 5.1 mmol/L Final  . Chloride 03/21/2015 100* 101 - 111 mmol/L Final  . CO2 03/21/2015 26  22 - 32 mmol/L Final  . Glucose, Bld 03/21/2015 182* 65  - 99 mg/dL Final  . BUN 03/21/2015 11  6 - 20 mg/dL Final  . Creatinine, Ser 03/21/2015 0.75  0.61 - 1.24 mg/dL Final  . Calcium 03/21/2015 8.6* 8.9 - 10.3 mg/dL Final  . GFR calc non Af Amer 03/21/2015 >60  >60 mL/min Final  . GFR calc Af Amer 03/21/2015 >60  >60 mL/min Final   Comment: (NOTE) The eGFR has been calculated using the CKD EPI equation. This calculation has not been validated in all clinical situations. eGFR's persistently <60 mL/min signify possible Chronic Kidney Disease.   Georgiann Hahn gap 03/21/2015 7  5 - 15 Final  Hospital Outpatient Visit on 03/15/2015  Component Date Value Ref Range Status  . aPTT 03/15/2015 32  24 - 37 seconds Final  . WBC 03/15/2015 7.1  4.0 - 10.5 K/uL Final  . RBC 03/15/2015 4.96  4.22 - 5.81 MIL/uL Final  . Hemoglobin 03/15/2015 14.5  13.0 - 17.0 g/dL Final  . HCT 03/15/2015 42.3  39.0 - 52.0 % Final  . MCV 03/15/2015 85.3  78.0 - 100.0 fL Final  . MCH 03/15/2015 29.2  26.0 - 34.0 pg Final  . MCHC 03/15/2015 34.3  30.0 - 36.0 g/dL Final  . RDW 03/15/2015 12.3  11.5 - 15.5 % Final  . Platelets 03/15/2015  246  150 - 400 K/uL Final  . Sodium 03/15/2015 136  135 - 145 mmol/L Final  . Potassium 03/15/2015 3.8  3.5 - 5.1 mmol/L Final  . Chloride 03/15/2015 101  101 - 111 mmol/L Final  . CO2 03/15/2015 26  22 - 32 mmol/L Final  . Glucose, Bld 03/15/2015 94  65 - 99 mg/dL Final  . BUN 03/15/2015 16  6 - 20 mg/dL Final  . Creatinine, Ser 03/15/2015 0.92  0.61 - 1.24 mg/dL Final  . Calcium 03/15/2015 9.0  8.9 - 10.3 mg/dL Final  . Total Protein 03/15/2015 7.8  6.5 - 8.1 g/dL Final  . Albumin 03/15/2015 4.4  3.5 - 5.0 g/dL Final  . AST 03/15/2015 24  15 - 41 U/L Final  . ALT 03/15/2015 32  17 - 63 U/L Final  . Alkaline Phosphatase 03/15/2015 86  38 - 126 U/L Final  . Total Bilirubin 03/15/2015 0.4  0.3 - 1.2 mg/dL Final  . GFR calc non Af Amer 03/15/2015 >60  >60 mL/min Final  . GFR calc Af Amer 03/15/2015 >60  >60 mL/min Final   Comment:  (NOTE) The eGFR has been calculated using the CKD EPI equation. This calculation has not been validated in all clinical situations. eGFR's persistently <60 mL/min signify possible Chronic Kidney Disease.   . Anion gap 03/15/2015 9  5 - 15 Final  . Prothrombin Time 03/15/2015 13.0  11.6 - 15.2 seconds Final  . INR 03/15/2015 0.96  0.00 - 1.49 Final  . Color, Urine 03/15/2015 YELLOW  YELLOW Final  . APPearance 03/15/2015 CLEAR  CLEAR Final  . Specific Gravity, Urine 03/15/2015 1.012  1.005 - 1.030 Final  . pH 03/15/2015 6.5  5.0 - 8.0 Final  . Glucose, UA 03/15/2015 NEGATIVE  NEGATIVE mg/dL Final  . Hgb urine dipstick 03/15/2015 NEGATIVE  NEGATIVE Final  . Bilirubin Urine 03/15/2015 NEGATIVE  NEGATIVE Final  . Ketones, ur 03/15/2015 NEGATIVE  NEGATIVE mg/dL Final  . Protein, ur 03/15/2015 NEGATIVE  NEGATIVE mg/dL Final  . Urobilinogen, UA 03/15/2015 1.0  0.0 - 1.0 mg/dL Final  . Nitrite 03/15/2015 NEGATIVE  NEGATIVE Final  . Leukocytes, UA 03/15/2015 NEGATIVE  NEGATIVE Final   MICROSCOPIC NOT DONE ON URINES WITH NEGATIVE PROTEIN, BLOOD, LEUKOCYTES, NITRITE, OR GLUCOSE <1000 mg/dL.  Marland Kitchen MRSA, PCR 03/15/2015 NEGATIVE  NEGATIVE Final  . Staphylococcus aureus 03/15/2015 NEGATIVE  NEGATIVE Final   Comment:        The Xpert SA Assay (FDA approved for NASAL specimens in patients over 27 years of age), is one component of a comprehensive surveillance program.  Test performance has been validated by [facility] for patients greater than or equal to 68 year old. It is not intended to diagnose infection nor to guide or monitor treatment.      X-Rays:Dg Pelvis Portable  03/20/2015   CLINICAL DATA:  Status post left hip joint replacement.  EXAM: PORTABLE PELVIS 1-2 VIEWS  COMPARISON:  None.  FINDINGS: This single AP portable view of the mid and lower pelvis and both hips reveals a prosthetic left hip joint. Radiographic positioning of the prosthetic components is good. The interface with the  native bone appears normal. The right hip is unremarkable.  IMPRESSION: The patient has undergone left hip joint replacement.   Electronically Signed   By: David  Martinique M.D.   On: 03/20/2015 16:15   Dg C-arm 61-120 Min-no Report  03/20/2015   CLINICAL DATA: hip   C-ARM 61-120 MINUTES  Fluoroscopy was  utilized by the requesting physician.  No radiographic  interpretation.     EKG:No orders found for this or any previous visit.   Hospital Course: Patient was admitted to [facility] and taken to the OR and underwent the above state procedure without complications.  Patient tolerated the procedure well and was later transferred to the recovery room and then to the orthopaedic floor for postoperative care.  They were given PO and IV analgesics for pain control following their surgery.  They were given 24 hours of postoperative antibiotics of  Anti-infectives    Start     Dose/Rate Route Frequency Ordered Stop   03/20/15 1930  ceFAZolin (ANCEF) IVPB 2 g/50 mL premix     2 g 100 mL/hr over 30 Minutes Intravenous Every 6 hours 03/20/15 1713 03/21/15 0131   03/20/15 1052  ceFAZolin (ANCEF) IVPB 2 g/50 mL premix     2 g 100 mL/hr over 30 Minutes Intravenous On call to O.R. 03/20/15 1052 03/20/15 1340     and started on DVT prophylaxis in the form of Xarelto.   PT and OT were ordered for total hip protocol.  The patient was allowed to be WBAT with therapy. Discharge planning was consulted to help with postop disposition and equipment needs.  Patient had a good night on the evening of surgery with no pain.  They started to get up OOB with therapy on day one.  Hemovac drain was pulled without difficulty.  Patient was seen in rounds on day one and was doing very well.  It was felt that as long as he did well with both sessions of therapy that day, he would be ready to go home later that afternoon.  Arrangements were made and he was setup to go home.  Discharge home with home health Diet - Cardiac  diet Follow up - in 2 weeks Activity - WBAT Disposition - Home Condition Upon Discharge - Good D/C Meds - See DC Summary DVT Prophylaxis - Xarelto  Discharge Instructions    Call MD / Call 911    Complete by:  As directed   If you experience chest pain or shortness of breath, CALL 911 and be transported to the hospital emergency room.  If you develope a fever above 101 F, pus (white drainage) or increased drainage or redness at the wound, or calf pain, call your surgeon's office.     Change dressing    Complete by:  As directed   You may change your dressing dressing daily with sterile 4 x 4 inch gauze dressing and paper tape.  Do not submerge the incision under water.     Constipation Prevention    Complete by:  As directed   Drink plenty of fluids.  Prune juice may be helpful.  You may use a stool softener, such as Colace (over the counter) 100 mg twice a day.  Use MiraLax (over the counter) for constipation as needed.     Diet Carb Modified    Complete by:  As directed      Discharge instructions    Complete by:  As directed   Pick up stool softner and laxative for home use following surgery while on pain medications. Do not submerge incision under water. Please use good hand washing techniques while changing dressing each day. May remove surgical dressing on Friday 7/8 and then apply a dry gauze dressing daily. May shower starting three days after surgery starting Saturday 03/23/2015. Please use a clean towel  to pat the incision dry following showers. Continue to use ice for pain and swelling after surgery. Do not use any lotions or creams on the incision until instructed by your surgeon.  Total Hip Protocol.  Take Xarelto for two and a half more weeks, then discontinue Xarelto. Once the patient has completed the Xarelto, they may resume the 81 mg Aspirin.  Postoperative Constipation Protocol  Constipation - defined medically as fewer than three stools per week and severe  constipation as less than one stool per week.  One of the most common issues patients have following surgery is constipation.  Even if you have a regular bowel pattern at home, your normal regimen is likely to be disrupted due to multiple reasons following surgery.  Combination of anesthesia, postoperative narcotics, change in appetite and fluid intake all can affect your bowels.  In order to avoid complications following surgery, here are some recommendations in order to help you during your recovery period.  Colace (docusate) - Pick up an over-the-counter form of Colace or another stool softener and take twice a day as long as you are requiring postoperative pain medications.  Take with a full glass of water daily.  If you experience loose stools or diarrhea, hold the colace until you stool forms back up.  If your symptoms do not get better within 1 week or if they get worse, check with your doctor.  Dulcolax (bisacodyl) - Pick up over-the-counter and take as directed by the product packaging as needed to assist with the movement of your bowels.  Take with a full glass of water.  Use this product as needed if not relieved by Colace only.   MiraLax (polyethylene glycol) - Pick up over-the-counter to have on hand.  MiraLax is a solution that will increase the amount of water in your bowels to assist with bowel movements.  Take as directed and can mix with a glass of water, juice, soda, coffee, or tea.  Take if you go more than two days without a movement. Do not use MiraLax more than once per day. Call your doctor if you are still constipated or irregular after using this medication for 7 days in a row.  If you continue to have problems with postoperative constipation, please contact the office for further assistance and recommendations.  If you experience "the worst abdominal pain ever" or develop nausea or vomiting, please contact the office immediatly for further recommendations for treatment.     Do  not sit on low chairs, stoools or toilet seats, as it may be difficult to get up from low surfaces    Complete by:  As directed      Driving restrictions    Complete by:  As directed   No driving until released by the physician.     Increase activity slowly as tolerated    Complete by:  As directed      Lifting restrictions    Complete by:  As directed   No lifting until released by the physician.     Patient may shower    Complete by:  As directed   You may shower without a dressing once there is no drainage.  Do not wash over the wound.  If drainage remains, do not shower until drainage stops.     TED hose    Complete by:  As directed   Use stockings (TED hose) for 3 weeks on both leg(s).  You may remove them at night for sleeping.  Weight bearing as tolerated    Complete by:  As directed   Laterality:  left  Extremity:  Lower            Medication List    STOP taking these medications        aspirin 81 MG tablet     Fish Oil 1000 MG Caps     meloxicam 15 MG tablet  Commonly known as:  MOBIC     TORADOL IJ      TAKE these medications        ALPRAZolam 1 MG tablet  Commonly known as:  XANAX  Take 1 mg by mouth every 6 (six) hours as needed for anxiety. anxiety     cetirizine 10 MG tablet  Commonly known as:  ZYRTEC  Take 10 mg by mouth daily.     methocarbamol 500 MG tablet  Commonly known as:  ROBAXIN  Take 1 tablet (500 mg total) by mouth every 6 (six) hours as needed for muscle spasms.     omeprazole 20 MG capsule  Commonly known as:  PRILOSEC  Take 20 mg by mouth daily.     Oxycodone HCl 10 MG Tabs  Take 1-2 tablets (10-20 mg total) by mouth every 3 (three) hours as needed for moderate pain or severe pain.     POTASSIUM GLUCONATE PO  Take 1 tablet by mouth daily.     rivaroxaban 10 MG Tabs tablet  Commonly known as:  XARELTO  Take 1 tablet (10 mg total) by mouth daily with breakfast. Take Xarelto for two and a half more weeks, then discontinue  Xarelto. Once the patient has completed the Xarelto, they may resume the 81 mg Aspirin.     simvastatin 20 MG tablet  Commonly known as:  ZOCOR  Take 20 mg by mouth every morning.     traMADol 50 MG tablet  Commonly known as:  ULTRAM  Take 1-2 tablets (50-100 mg total) by mouth every 6 (six) hours as needed (mild pain).           Follow-up Information    Follow up with [name], MD. Schedule an appointment as soon as possible for a visit on 04/02/2015.   Specialty:  Orthopedic Surgery   Why:  Call office at [phone] to setup appointment on Tuesday 04/02/2015 with Dr. Wynelle Link.   Contact information:   [address] [place] [address] [phone]       Signed: Arlee Muslim, PA-C Orthopaedic Surgery 03/21/2015, 8:20 AM

## 2015-03-21 NOTE — Progress Notes (Signed)
Pt to d/c home with [place]. DME delivered to room prior to d/c. AVS reviewed and "My Chart" discussed with pt. Pt capable of verbalizing medications, dressing changes, signs and symptoms of infection, and follow-up appointments. Remains hemodynamically stable. No signs and symptoms of distress. Educated pt to return to ER in the case of SOB, dizziness, or chest pain.

## 2015-03-21 NOTE — Care Management Note (Signed)
Case Management Note  Patient Details  Name: Craig Scott MRN: [id number] Date of Birth: 1955/10/01  Subjective/Objective:                  LEFT TOTAL HIP ARTHROPLASTY ANTERIOR APPROACH (Left)  Action/Plan:   Expected Discharge Date:                  Expected Discharge Plan:  [place]  In-House Referral:     Discharge planning Services  CM Consult  Post Acute Care Choice:  Home Health Choice offered to:  Patient  DME Arranged:  3-N-1, Crutches DME Agency:  [place]:  PT [facility] Agency:  [place]  Status of Service:  Completed, signed off  Medicare Important Message Given:    Date Medicare IM Given:    Medicare IM give by:    Date Additional Medicare IM Given:    Additional Medicare Important Message give by:     If discussed at [place] of Stay Meetings, dates discussed:    Additional Comments: CM met with pt and pt's wife, [name] in room to offer choice of home health agency.  Pt chooses [facility] to render HHPT.  Address and contact information verified by pt.  Referral called to [facility] rep, Kristen.  CM called [facility] DME rep, Lecretia to please deliver the 3n1 to the room prior to discharge.  No other CM needs were communicated. Dellie Catholic, RN 03/21/2015, 1:35 PM

## 2015-03-21 NOTE — Evaluation (Signed)
Physical Therapy Evaluation Patient Details Name: Craig Scott MRN: [id number] DOB: 11/19/1955 Today's Date: 03/21/2015   History of Present Illness  Pt is s/p L anterior THA  Clinical Impression  Pt s/p L THR presents with decreased L LE strength/ROM and post op pain limiting functional mobility.  Pt should progress to dc home with family assist and HHPT follow up.    Follow Up Recommendations Home health PT    Equipment Recommendations  None recommended by PT    Recommendations for Other Services OT consult     Precautions / Restrictions Precautions Precautions: Fall Restrictions Weight Bearing Restrictions: No Other Position/Activity Restrictions: WBAT      Mobility  Bed Mobility Overal bed mobility: Needs Assistance Bed Mobility: Supine to Sit     Supine to sit: Min guard     General bed mobility comments: OOB with OT  Transfers Overall transfer level: Needs assistance Equipment used: Rolling walker (2 wheeled) Transfers: Sit to/from Stand Sit to Stand: Min guard         General transfer comment: verbal cues for hand placement.  Ambulation/Gait Ambulation/Gait assistance: Min assist;Min guard Ambulation Distance (Feet): 250 Feet Assistive device: Rolling walker (2 wheeled) Gait Pattern/deviations: Step-to pattern;Step-through pattern;Decreased step length - right;Decreased step length - left;Shuffle;Trunk flexed     General Gait Details: cues for posture, position from RW and intial sequence  Stairs            Wheelchair Mobility    Modified Rankin (Stroke Patients Only)       Balance                                             Pertinent Vitals/Pain Pain Assessment: 0-10 Pain Score: 4  Pain Location: L hip Pain Descriptors / Indicators: Aching;Sore Pain Intervention(s): Limited activity within patient's tolerance;Monitored during session;Premedicated before session;Ice applied    Home Living Family/patient  expects to be discharged to:: Private residence Living Arrangements: Spouse/significant other;Children Available Help at Discharge: Family Type of Home: House Home Access: Stairs to enter Entrance Stairs-Rails: None Entrance Stairs-Number of Steps: 2 Home Layout: Two level Home Equipment: Environmental consultant - 2 wheels;Shower seat      Prior Function Level of Independence: Independent      ADL's / Homemaking Assistance Needed: wife has been asssting with LB dressing        Hand Dominance        Extremity/Trunk Assessment   Upper Extremity Assessment: Overall WFL for tasks assessed           Lower Extremity Assessment: LLE deficits/detail   LLE Deficits / Details: Hip strength 3-/5 with AAROM at hip to 80 flex and 20 abd  Cervical / Trunk Assessment: Normal  Communication   Communication: No difficulties  Cognition Arousal/Alertness: Awake/alert Behavior During Therapy: WFL for tasks assessed/performed Overall Cognitive Status: Within Functional Limits for tasks assessed                      General Comments      Exercises Total Joint Exercises Ankle Circles/Pumps: AROM;Both;15 reps;Supine Quad Sets: AROM;Both;10 reps;Supine Heel Slides: AAROM;Left;20 reps;Supine Hip ABduction/ADduction: AAROM;Left;15 reps;Supine      Assessment/Plan    PT Assessment Patient needs continued PT services  PT Diagnosis Difficulty walking   PT Problem List Decreased strength;Decreased range of motion;Decreased activity tolerance;Decreased balance;Decreased mobility;Decreased knowledge of use  of DME;Pain  PT Treatment Interventions DME instruction;Gait training;Stair training;Functional mobility training;Therapeutic activities;Therapeutic exercise;Patient/family education   PT Goals (Current goals can be found in the Care Plan section) Acute Rehab PT Goals Patient Stated Goal: home PT Goal Formulation: With patient Potential to Achieve Goals: Good    Frequency 7X/week    Barriers to discharge        Co-evaluation               End of Session Equipment Utilized During Treatment: Gait belt Activity Tolerance: Patient tolerated treatment well Patient left: in chair;with call bell/phone within reach;with family/visitor present Nurse Communication: Mobility status         Time: 9622-2979 PT Time Calculation (min) (ACUTE ONLY): 38 min   Charges:   PT Evaluation $Initial PT Evaluation Tier I: 1 Procedure PT Treatments $Gait Training: 8-22 mins $Therapeutic Exercise: 8-22 mins   PT G Codes:        Alexa Blish April 01, 2015, 12:58 PM

## 2015-03-21 NOTE — Evaluation (Signed)
Occupational Therapy One Time Evaluation Patient Details Name: Craig Scott MRN: [id number] DOB: 04-Sep-1956 Today's Date: 03/21/2015    History of Present Illness Pt is s/p L anterior THA   Clinical Impression   Pt needs occasional verbal cues to keep walker close to him as he back up to chair and not leave to the side. Overall min guard to min assist with ADL and wife able to assist at d/c. All education completed including shower transfer and pt may d/c today.    Follow Up Recommendations  No OT follow up;Supervision - Intermittent    Equipment Recommendations  3 in 1 bedside comode    Recommendations for Other Services       Precautions / Restrictions Precautions Precautions: None Restrictions Weight Bearing Restrictions: No      Mobility Bed Mobility Overal bed mobility: Needs Assistance Bed Mobility: Supine to Sit     Supine to sit: Min guard        Transfers Overall transfer level: Needs assistance Equipment used: Rolling walker (2 wheeled) Transfers: Sit to/from Stand Sit to Stand: Min guard         General transfer comment: verbal cues for hand placement.    Balance                                            ADL Overall ADL's : Needs assistance/impaired Eating/Feeding: Independent;Sitting   Grooming: Wash/dry hands;Set up;Sitting   Upper Body Bathing: Set up;Sitting   Lower Body Bathing: Minimal assistance;Sit to/from stand   Upper Body Dressing : Set up;Sitting   Lower Body Dressing: Minimal assistance;Sit to/from stand   Toilet Transfer: Min guard;Ambulation;BSC;RW   Toileting- Water quality scientist and Hygiene: Min guard;Sit to/from stand   Tub/ Shower Transfer: Walk-in shower;Min guard;Grab bars;Rolling walker;3 in 1     General ADL Comments: educated on AE options and pt is considering a LHS but wife present and states she can also assist. pt stood to attempt voiding but unable this visit. Did don boxer  shorts and practiced sequence for LB dressing and safety with sitting down to thread over LEs and then stand to pull up. Discussed placement of seat in shower for safety. Issued shower transfer handout.      Vision     Perception     Praxis      Pertinent Vitals/Pain Pain Assessment: 0-10 Pain Score: 4  Pain Location: L hip Pain Descriptors / Indicators: Sore Pain Intervention(s): Repositioned;Ice applied     Hand Dominance     Extremity/Trunk Assessment Upper Extremity Assessment Upper Extremity Assessment: Overall WFL for tasks assessed           Communication Communication Communication: No difficulties   Cognition Arousal/Alertness: Awake/alert Behavior During Therapy: WFL for tasks assessed/performed Overall Cognitive Status: Within Functional Limits for tasks assessed                     General Comments       Exercises       Shoulder Instructions      Home Living Family/patient expects to be discharged to:: Private residence Living Arrangements: Spouse/significant other;Children Available Help at Discharge: Family Type of Home: House Home Access: Stairs to enter Technical brewer of Steps: 2 Entrance Stairs-Rails: None Home Layout: Two level Alternate Level Stairs-Number of Steps: 14 with landing Alternate Level Stairs-Rails: Right Bathroom Shower/Tub: Walk-in  shower   Bathroom Toilet: Standard (states higher commode upstairs)     Home Equipment: Walker - 2 wheels;Shower seat          Prior Functioning/Environment      ADL's / Homemaking Assistance Needed: wife has been asssting with LB dressing        OT Diagnosis: Generalized weakness   OT Problem List:     OT Treatment/Interventions:      OT Goals(Current goals can be found in the care plan section) Acute Rehab OT Goals Patient Stated Goal: home OT Goal Formulation: With patient/family  OT Frequency:     Barriers to D/C:            Co-evaluation               End of Session Equipment Utilized During Treatment: Rolling walker  Activity Tolerance: Patient tolerated treatment well Patient left: in chair;with call bell/phone within reach;with family/visitor present   Time: 1308-6578 OT Time Calculation (min): 47 min Charges:  OT General Charges $OT Visit: 1 Procedure OT Evaluation $Initial OT Evaluation Tier I: 1 Procedure OT Treatments $Self Care/Home Management : 8-22 mins $Therapeutic Activity: 8-22 mins G-Codes:    Jules Schick  [phone] 03/21/2015, 10:01 AM

## 2015-03-21 NOTE — Progress Notes (Signed)
Physical Therapy Treatment Patient Details Name: Craig Scott MRN: [id number] DOB: 1956/07/16 Today's Date: 10-Apr-2015    History of Present Illness Pt is s/p L anterior THA    PT Comments    Pt progressing well with mobility and eager for dc home.  Reviewed stairs and car transfers.  Follow Up Recommendations  Home health PT     Equipment Recommendations  None recommended by PT    Recommendations for Other Services OT consult     Precautions / Restrictions Precautions Precautions: Fall Restrictions Weight Bearing Restrictions: No Other Position/Activity Restrictions: WBAT    Mobility  Bed Mobility                  Transfers Overall transfer level: Needs assistance Equipment used: Rolling walker (2 wheeled) Transfers: Sit to/from Stand Sit to Stand: Supervision         General transfer comment: verbal cues for hand placement.  Ambulation/Gait Ambulation/Gait assistance: Min guard;Supervision Ambulation Distance (Feet): 180 Feet (and 15' to bathroom) Assistive device: Rolling walker (2 wheeled) Gait Pattern/deviations: Step-to pattern;Step-through pattern;Decreased step length - right;Decreased step length - left;Shuffle;Trunk flexed     General Gait Details: cues for posture, position from RW and intial sequence   Stairs Stairs: Yes Stairs assistance: Min assist Stair Management: One rail Left;Step to pattern;Forwards;With crutches Number of Stairs: 10 General stair comments: 10 stairs with crutch and rail, up 4 stairs with bil crutches.  cues for sequence and foot/crutch placement, written instructions provided  Wheelchair Mobility    Modified Rankin (Stroke Patients Only)       Balance                                    Cognition Arousal/Alertness: Awake/alert Behavior During Therapy: WFL for tasks assessed/performed Overall Cognitive Status: Within Functional Limits for tasks assessed                       Exercises      General Comments        Pertinent Vitals/Pain Pain Assessment: 0-10 Pain Score: 5  Pain Location: L hip Pain Descriptors / Indicators: Aching;Sore Pain Intervention(s): Limited activity within patient's tolerance;Monitored during session;Patient requesting pain meds-RN notified;Ice applied    Home Living                      Prior Function            PT Goals (current goals can now be found in the care plan section) Acute Rehab PT Goals Patient Stated Goal: home PT Goal Formulation: With patient Potential to Achieve Goals: Good Progress towards PT goals: Progressing toward goals    Frequency  7X/week    PT Plan Current plan remains appropriate    Co-evaluation             End of Session Equipment Utilized During Treatment: Gait belt Activity Tolerance: Patient tolerated treatment well Patient left: in chair;with call bell/phone within reach;with family/visitor present     Time: 1331-1420 PT Time Calculation (min) (ACUTE ONLY): 49 min  Charges:  $Gait Training: 23-37 mins $Therapeutic Activity: 8-22 mins                    G Codes:      Jevon Littlepage 04/10/15, 4:44 PM

## 2015-03-21 NOTE — Plan of Care (Signed)
Problem: Phase III Progression Outcomes Goal: Anticoagulant follow-up in place Outcome: Not Applicable Date Met:  03/21/15 Xarelto VTE, no f/u needed.     

## 2015-03-21 NOTE — Progress Notes (Signed)
Utilization review completed.  

## 2015-03-21 NOTE — Discharge Instructions (Addendum)
° °Dr. Frank Aluisio °Total Joint Specialist °[facility] °[address] °[place], [place] [address] °[phone] ° °ANTERIOR APPROACH TOTAL HIP REPLACEMENT POSTOPERATIVE DIRECTIONS ° ° °Hip Rehabilitation, Guidelines Following Surgery  °The results of a hip operation are greatly improved after range of motion and muscle strengthening exercises. Follow all safety measures which are given to protect your hip. If any of these exercises cause increased pain or swelling in your joint, decrease the amount until you are comfortable again. Then slowly increase the exercises. Call your caregiver if you have problems or questions.  ° °HOME CARE INSTRUCTIONS  °Remove items at home which could result in a fall. This includes throw rugs or furniture in walking pathways.  °· ICE to the affected hip every three hours for 30 minutes at a time and then as needed for pain and swelling.  Continue to use ice on the hip for pain and swelling from surgery. You may notice swelling that will progress down to the foot and ankle.  This is normal after surgery.  Elevate the leg when you are not up walking on it.   °· Continue to use the breathing machine which will help keep your temperature down.  It is common for your temperature to cycle up and down following surgery, especially at night when you are not up moving around and exerting yourself.  The breathing machine keeps your lungs expanded and your temperature down. ° ° °DIET °You may resume your previous home diet once your are discharged from the hospital. ° °DRESSING / WOUND CARE / SHOWERING °You may shower 3 days after surgery, but keep the wounds dry during showering.  You may use an occlusive plastic wrap (Press'n Seal for example), NO SOAKING/SUBMERGING IN THE BATHTUB.  If the bandage gets wet, change with a clean dry gauze.  If the incision gets wet, pat the wound dry with a clean towel. °You may start showering once you are discharged home but do not  submerge the incision under water. Just pat the incision dry and apply a dry gauze dressing on daily. °Change the surgical dressing daily and reapply a dry dressing each time. ° °ACTIVITY °Walk with your walker as instructed. °Use walker as long as suggested by your caregivers. °Avoid periods of inactivity such as sitting longer than an hour when not asleep. This helps prevent blood clots.  °You may resume a sexual relationship in one month or when given the OK by your doctor.  °You may return to work once you are cleared by your doctor.  °Do not drive a car for 6 weeks or until released by you surgeon.  °Do not drive while taking narcotics. ° °WEIGHT BEARING °Weight bearing as tolerated with assist device (walker, cane, etc) as directed, use it as long as suggested by your surgeon or therapist, typically at least 4-6 weeks. ° °POSTOPERATIVE CONSTIPATION PROTOCOL °Constipation - defined medically as fewer than three stools per week and severe constipation as less than one stool per week. ° °One of the most common issues patients have following surgery is constipation.  Even if you have a regular bowel pattern at home, your normal regimen is likely to be disrupted due to multiple reasons following surgery.  Combination of anesthesia, postoperative narcotics, change in appetite and fluid intake all can affect your bowels.  In order to avoid complications following surgery, here are some recommendations in order to help you during your recovery period. ° °Colace (docusate) - Pick up an over-the-counter   form of Colace or another stool softener and take twice a day as long as you are requiring postoperative pain medications.  Take with a full glass of water daily.  If you experience loose stools or diarrhea, hold the colace until you stool forms back up.  If your symptoms do not get better within 1 week or if they get worse, check with your doctor. ° °Dulcolax (bisacodyl) - Pick up over-the-counter and take as directed  by the product packaging as needed to assist with the movement of your bowels.  Take with a full glass of water.  Use this product as needed if not relieved by Colace only.  ° °MiraLax (polyethylene glycol) - Pick up over-the-counter to have on hand.  MiraLax is a solution that will increase the amount of water in your bowels to assist with bowel movements.  Take as directed and can mix with a glass of water, juice, soda, coffee, or tea.  Take if you go more than two days without a movement. °Do not use MiraLax more than once per day. Call your doctor if you are still constipated or irregular after using this medication for 7 days in a row. ° °If you continue to have problems with postoperative constipation, please contact the office for further assistance and recommendations.  If you experience "the worst abdominal pain ever" or develop nausea or vomiting, please contact the office immediatly for further recommendations for treatment. ° °ITCHING ° If you experience itching with your medications, try taking only a single pain pill, or even half a pain pill at a time.  You can also use Benadryl over the counter for itching or also to help with sleep.  ° °TED HOSE STOCKINGS °Wear the elastic stockings on both legs for three weeks following surgery during the day but you may remove then at night for sleeping. ° °MEDICATIONS °See your medication summary on the “After Visit Summary” that the nursing staff will review with you prior to discharge.  You may have some home medications which will be placed on hold until you complete the course of blood thinner medication.  It is important for you to complete the blood thinner medication as prescribed by your surgeon.  Continue your approved medications as instructed at time of discharge. ° °PRECAUTIONS °If you experience chest pain or shortness of breath - call 911 immediately for transfer to the hospital emergency department.  °If you develop a fever greater that 101 F,  purulent drainage from wound, increased redness or drainage from wound, foul odor from the wound/dressing, or calf pain - CONTACT YOUR SURGEON.   °                                                °FOLLOW-UP APPOINTMENTS °Make sure you keep all of your appointments after your operation with your surgeon and caregivers. You should call the office at the above phone number and make an appointment for approximately two weeks after the date of your surgery or on the date instructed by your surgeon outlined in the "After Visit Summary". ° °RANGE OF MOTION AND STRENGTHENING EXERCISES  °These exercises are designed to help you keep full movement of your hip joint. Follow your caregiver's or physical therapist's instructions. Perform all exercises about fifteen times, three times per day or as directed. Exercise both hips, even if you   have had only one joint replacement. These exercises can be done on a training (exercise) mat, on the floor, on a table or on a bed. Use whatever works the best and is most comfortable for you. Use music or television while you are exercising so that the exercises are a pleasant break in your day. This will make your life better with the exercises acting as a break in routine you can look forward to.  Lying on your back, slowly slide your foot toward your buttocks, raising your knee up off the floor. Then slowly slide your foot back down until your leg is straight again.  Lying on your back spread your legs as far apart as you can without causing discomfort.  Lying on your side, raise your upper leg and foot straight up from the floor as far as is comfortable. Slowly lower the leg and repeat.  Lying on your back, tighten up the muscle in the front of your thigh (quadriceps muscles). You can do this by keeping your leg straight and trying to raise your heel off the floor. This helps strengthen the largest muscle supporting your knee.  Lying on your back, tighten up the muscles of your  buttocks both with the legs straight and with the knee bent at a comfortable angle while keeping your heel on the floor.   IF YOU ARE TRANSFERRED TO A SKILLED REHAB FACILITY If the patient is transferred to a skilled rehab facility following release from the hospital, a list of the current medications will be sent to the facility for the patient to continue.  When discharged from the skilled rehab facility, please have the facility set up the patient's [place] prior to being released. Also, the skilled facility will be responsible for providing the patient with their medications at time of release from the facility to include their pain medication, the muscle relaxants, and their blood thinner medication. If the patient is still at the rehab facility at time of the two week follow up appointment, the skilled rehab facility will also need to assist the patient in arranging follow up appointment in our office and any transportation needs.  MAKE SURE YOU:  Understand these instructions.  Get help right away if you are not doing well or get worse.    Pick up stool softner and laxative for home use following surgery while on pain medications. Do not submerge incision under water. Please use good hand washing techniques while changing dressing each day. May remove surgical dressing on Friday 7/8 and then apply a dry gauze dressing daily. May shower starting three days after surgery starting Saturday 03/23/2015. Please use a clean towel to pat the incision dry following showers. Continue to use ice for pain and swelling after surgery. Do not use any lotions or creams on the incision until instructed by your surgeon.  Take Xarelto for two and a half more weeks, then discontinue Xarelto. Once the patient has completed the Xarelto, they may resume the 81 mg Aspirin.  Information on my medicine - XARELTO (Rivaroxaban)  This medication education was reviewed with me or my healthcare  representative as part of my discharge preparation.  The pharmacist that spoke with me during my hospital stay was:  Luiz Ochoa [facility]  Why was Xarelto prescribed for you? Xarelto was prescribed for you to reduce the risk of blood clots forming after orthopedic surgery. The medical term for these abnormal blood clots is venous thromboembolism (VTE).  What do  you need to know about xarelto ? Take your Xarelto ONCE DAILY at the same time every day. You may take it either with or without food.  If you have difficulty swallowing the tablet whole, you may crush it and mix in applesauce just prior to taking your dose.  Take Xarelto exactly as prescribed by your doctor and DO NOT stop taking Xarelto without talking to the doctor who prescribed the medication.  Stopping without other VTE prevention medication to take the place of Xarelto may increase your risk of developing a clot.  After discharge, you should have regular check-up appointments with your healthcare provider that is prescribing your Xarelto.    What do you do if you miss a dose? If you miss a dose, take it as soon as you remember on the same day then continue your regularly scheduled once daily regimen the next day. Do not take two doses of Xarelto on the same day.   Important Safety Information A possible side effect of Xarelto is bleeding. You should call your healthcare provider right away if you experience any of the following: ? Bleeding from an injury or your nose that does not stop. ? Unusual colored urine (red or dark brown) or unusual colored stools (red or black). ? Unusual bruising for unknown reasons. ? A serious fall or if you hit your head (even if there is no bleeding).  Some medicines may interact with Xarelto and might increase your risk of bleeding while on Xarelto. To help avoid this, consult your healthcare provider or pharmacist prior to using any new prescription or non-prescription medications,  including herbals, vitamins, non-steroidal anti-inflammatory drugs (NSAIDs) and supplements.  This website has more information on Xarelto: [email or web address]

## 2015-03-26 ENCOUNTER — Encounter: Payer: Self-pay | Admitting: *Deleted

## 2015-04-04 ENCOUNTER — Other Ambulatory Visit: Payer: Self-pay | Admitting: Orthopedic Surgery

## 2015-05-03 ENCOUNTER — Encounter: Payer: Self-pay | Admitting: Cardiovascular Disease

## 2015-06-20 ENCOUNTER — Encounter (HOSPITAL_BASED_OUTPATIENT_CLINIC_OR_DEPARTMENT_OTHER): Payer: Self-pay | Admitting: *Deleted

## 2015-06-26 NOTE — Anesthesia Preprocedure Evaluation (Addendum)
Anesthesia Evaluation  Patient identified by MRN, date of birth, ID band Patient awake    Reviewed: Allergy & Precautions, NPO status , Patient's Chart, lab work & pertinent test results  Airway Mallampati: III  TM Distance: >3 FB Neck ROM: Full    Dental  (+) Teeth Intact   Pulmonary former smoker,    breath sounds clear to auscultation       Cardiovascular negative cardio ROS   Rhythm:Regular Rate:Normal     Neuro/Psych Anxiety negative neurological ROS  negative psych ROS   GI/Hepatic Neg liver ROS, GERD  Medicated,  Endo/Other  negative endocrine ROS  Renal/GU negative Renal ROS  negative genitourinary   Musculoskeletal  (+) Arthritis ,   Abdominal   Peds negative pediatric ROS (+)  Hematology negative hematology ROS (+)   Anesthesia Other Findings   Reproductive/Obstetrics negative OB ROS                           Anesthesia Physical Anesthesia Plan  ASA: II  Anesthesia Plan: General   Post-op Pain Management:    Induction: Intravenous  Airway Management Planned: LMA  Additional Equipment:   Intra-op Plan:   Post-operative Plan: Extubation in OR  Informed Consent: I have reviewed the patients History and Physical, chart, labs and discussed the procedure including the risks, benefits and alternatives for the proposed anesthesia with the patient or authorized representative who has indicated his/her understanding and acceptance.   Dental advisory given  Plan Discussed with: CRNA  Anesthesia Plan Comments: (Xarelto?? 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 )      Anesthesia Quick Evaluation

## 2015-06-27 ENCOUNTER — Ambulatory Visit (HOSPITAL_BASED_OUTPATIENT_CLINIC_OR_DEPARTMENT_OTHER): Payer: BC Managed Care – PPO | Admitting: Anesthesiology

## 2015-06-27 ENCOUNTER — Encounter (HOSPITAL_BASED_OUTPATIENT_CLINIC_OR_DEPARTMENT_OTHER): Payer: Self-pay | Admitting: Certified Registered"

## 2015-06-27 ENCOUNTER — Ambulatory Visit (HOSPITAL_BASED_OUTPATIENT_CLINIC_OR_DEPARTMENT_OTHER)
Admission: RE | Admit: 2015-06-27 | Discharge: 2015-06-28 | Disposition: A | Discharge status: Home / Self Care | Payer: BC Managed Care – PPO | Source: Ambulatory Visit | Attending: Orthopedic Surgery | Admitting: Orthopedic Surgery

## 2015-06-27 ENCOUNTER — Encounter (HOSPITAL_BASED_OUTPATIENT_CLINIC_OR_DEPARTMENT_OTHER)
Admission: RE | Disposition: A | Discharge status: Home / Self Care | Payer: Self-pay | Source: Ambulatory Visit | Attending: Orthopedic Surgery

## 2015-06-27 DIAGNOSIS — Z791 Long term (current) use of non-steroidal anti-inflammatories (NSAID): Secondary | ICD-10-CM | POA: Diagnosis not present

## 2015-06-27 DIAGNOSIS — M2140 Flat foot [pes planus] (acquired), unspecified foot: Secondary | ICD-10-CM | POA: Diagnosis present

## 2015-06-27 DIAGNOSIS — M2141 Flat foot [pes planus] (acquired), right foot: Secondary | ICD-10-CM | POA: Insufficient documentation

## 2015-06-27 DIAGNOSIS — M069 Rheumatoid arthritis, unspecified: Secondary | ICD-10-CM | POA: Insufficient documentation

## 2015-06-27 DIAGNOSIS — M76821 Posterior tibial tendinitis, right leg: Secondary | ICD-10-CM | POA: Diagnosis not present

## 2015-06-27 DIAGNOSIS — M62471 Contracture of muscle, right ankle and foot: Secondary | ICD-10-CM | POA: Insufficient documentation

## 2015-06-27 DIAGNOSIS — Z96642 Presence of left artificial hip joint: Secondary | ICD-10-CM | POA: Diagnosis not present

## 2015-06-27 DIAGNOSIS — G629 Polyneuropathy, unspecified: Secondary | ICD-10-CM | POA: Diagnosis not present

## 2015-06-27 DIAGNOSIS — Z79899 Other long term (current) drug therapy: Secondary | ICD-10-CM | POA: Insufficient documentation

## 2015-06-27 DIAGNOSIS — M7741 Metatarsalgia, right foot: Secondary | ICD-10-CM | POA: Diagnosis not present

## 2015-06-27 DIAGNOSIS — Z79891 Long term (current) use of opiate analgesic: Secondary | ICD-10-CM | POA: Diagnosis not present

## 2015-06-27 DIAGNOSIS — E7800 Pure hypercholesterolemia, unspecified: Secondary | ICD-10-CM | POA: Diagnosis not present

## 2015-06-27 DIAGNOSIS — Z96652 Presence of left artificial knee joint: Secondary | ICD-10-CM | POA: Insufficient documentation

## 2015-06-27 DIAGNOSIS — K219 Gastro-esophageal reflux disease without esophagitis: Secondary | ICD-10-CM | POA: Insufficient documentation

## 2015-06-27 DIAGNOSIS — F419 Anxiety disorder, unspecified: Secondary | ICD-10-CM | POA: Diagnosis not present

## 2015-06-27 DIAGNOSIS — M2041 Other hammer toe(s) (acquired), right foot: Secondary | ICD-10-CM | POA: Diagnosis not present

## 2015-06-27 HISTORY — PX: CALCANEAL OSTEOTOMY: SHX1281

## 2015-06-27 HISTORY — PX: BUNIONECTOMY WITH HAMMERTOE RECONSTRUCTION AND GASTROC SLIDE: SHX5601

## 2015-06-27 SURGERY — BUNIONECTOMY WITH HAMMERTOE RECONSTRUCTION AND GASTROC SLIDE
Anesthesia: General | Site: Leg Lower | Laterality: Right

## 2015-06-27 MED ORDER — ONDANSETRON HCL 4 MG/2ML IJ SOLN
4.0000 mg | Freq: Four times a day (QID) | INTRAMUSCULAR | Status: DC | PRN
  Administered 2015-06-27: 4 mg via INTRAVENOUS
  Filled 2015-06-27: qty 2

## 2015-06-27 MED ORDER — SODIUM CHLORIDE 0.9 % IV SOLN
INTRAVENOUS | Status: DC | PRN
  Administered 2015-06-27: 40 mL

## 2015-06-27 MED ORDER — HYDROMORPHONE HCL 1 MG/ML IJ SOLN
INTRAMUSCULAR | Status: AC
  Filled 2015-06-27: qty 1

## 2015-06-27 MED ORDER — DIPHENHYDRAMINE HCL 12.5 MG/5ML PO ELIX: 12.5000 mg | ORAL_SOLUTION | ORAL | Status: DC | PRN

## 2015-06-27 MED ORDER — FENTANYL CITRATE (PF) 100 MCG/2ML IJ SOLN
INTRAMUSCULAR | Status: AC
  Filled 2015-06-27: qty 4

## 2015-06-27 MED ORDER — DOCUSATE SODIUM 100 MG PO CAPS: 100.0000 mg | ORAL_CAPSULE | Freq: Two times a day (BID) | ORAL | Status: DC

## 2015-06-27 MED ORDER — PANTOPRAZOLE SODIUM 40 MG PO TBEC: 40.0000 mg | DELAYED_RELEASE_TABLET | Freq: Every day | ORAL | Status: DC

## 2015-06-27 MED ORDER — ASPIRIN EC 325 MG PO TBEC: 325.0000 mg | DELAYED_RELEASE_TABLET | Freq: Every day | ORAL | Status: DC

## 2015-06-27 MED ORDER — FENTANYL CITRATE (PF) 100 MCG/2ML IJ SOLN
50.0000 ug | INTRAMUSCULAR | Status: AC | PRN
  Administered 2015-06-27 (×3): 25 ug via INTRAVENOUS
  Administered 2015-06-27: 50 ug via INTRAVENOUS
  Administered 2015-06-27 (×2): 25 ug via INTRAVENOUS
  Administered 2015-06-27 (×2): 50 ug via INTRAVENOUS
  Administered 2015-06-27: 25 ug via INTRAVENOUS

## 2015-06-27 MED ORDER — SCOPOLAMINE 1 MG/3DAYS TD PT72: 1.0000 | MEDICATED_PATCH | Freq: Once | TRANSDERMAL | Status: DC | PRN

## 2015-06-27 MED ORDER — LACTATED RINGERS IV SOLN: INTRAVENOUS | Status: DC

## 2015-06-27 MED ORDER — ACETAMINOPHEN 650 MG RE SUPP: 650.0000 mg | Freq: Four times a day (QID) | RECTAL | Status: DC | PRN

## 2015-06-27 MED ORDER — MIDAZOLAM HCL 2 MG/2ML IJ SOLN
1.0000 mg | INTRAMUSCULAR | Status: DC | PRN
  Administered 2015-06-27: 2 mg via INTRAVENOUS

## 2015-06-27 MED ORDER — ONDANSETRON HCL 4 MG/2ML IJ SOLN
INTRAMUSCULAR | Status: DC | PRN
  Administered 2015-06-27: 4 mg via INTRAVENOUS

## 2015-06-27 MED ORDER — SODIUM CHLORIDE 0.9 % IV SOLN
INTRAVENOUS | Status: DC
  Administered 2015-06-27 – 2015-06-28 (×2): via INTRAVENOUS

## 2015-06-27 MED ORDER — DEXAMETHASONE SODIUM PHOSPHATE 10 MG/ML IJ SOLN
INTRAMUSCULAR | Status: DC | PRN
  Administered 2015-06-27: 10 mg via INTRAVENOUS

## 2015-06-27 MED ORDER — ALPRAZOLAM 1 MG PO TABS
1.0000 mg | ORAL_TABLET | Freq: Four times a day (QID) | ORAL | Status: DC | PRN
  Administered 2015-06-27 – 2015-06-28 (×2): 1 mg via ORAL
  Filled 2015-06-27 (×2): qty 4

## 2015-06-27 MED ORDER — LIDOCAINE HCL (CARDIAC) 20 MG/ML IV SOLN
INTRAVENOUS | Status: AC
  Filled 2015-06-27: qty 5

## 2015-06-27 MED ORDER — KETOROLAC TROMETHAMINE 30 MG/ML IJ SOLN
30.0000 mg | Freq: Once | INTRAMUSCULAR | Status: AC
  Administered 2015-06-27: 30 mg via INTRAVENOUS

## 2015-06-27 MED ORDER — ENOXAPARIN SODIUM 40 MG/0.4ML ~~LOC~~ SOLN: 40.0000 mg | SUBCUTANEOUS | Status: DC

## 2015-06-27 MED ORDER — METOCLOPRAMIDE HCL 5 MG/ML IJ SOLN: 5.0000 mg | Freq: Three times a day (TID) | INTRAMUSCULAR | Status: DC | PRN

## 2015-06-27 MED ORDER — ACETAMINOPHEN 10 MG/ML IV SOLN
INTRAVENOUS | Status: AC
  Filled 2015-06-27: qty 100

## 2015-06-27 MED ORDER — PROMETHAZINE HCL 25 MG/ML IJ SOLN: 6.2500 mg | INTRAMUSCULAR | Status: DC | PRN

## 2015-06-27 MED ORDER — HYDROMORPHONE HCL 1 MG/ML IJ SOLN
0.2500 mg | INTRAMUSCULAR | Status: DC | PRN
  Administered 2015-06-27 (×4): 0.5 mg via INTRAVENOUS

## 2015-06-27 MED ORDER — MORPHINE SULFATE (PF) 2 MG/ML IV SOLN: 2.0000 mg | INTRAVENOUS | Status: DC | PRN

## 2015-06-27 MED ORDER — BUPIVACAINE LIPOSOME 1.3 % IJ SUSP
INTRAMUSCULAR | Status: AC
  Filled 2015-06-27: qty 20

## 2015-06-27 MED ORDER — SENNA 8.6 MG PO TABS: 2.0000 | ORAL_TABLET | Freq: Two times a day (BID) | ORAL | Status: DC

## 2015-06-27 MED ORDER — METOCLOPRAMIDE HCL 5 MG PO TABS: 5.0000 mg | ORAL_TABLET | Freq: Three times a day (TID) | ORAL | Status: DC | PRN

## 2015-06-27 MED ORDER — MEPERIDINE HCL 25 MG/ML IJ SOLN: 6.2500 mg | INTRAMUSCULAR | Status: DC | PRN

## 2015-06-27 MED ORDER — GLYCOPYRROLATE 0.2 MG/ML IJ SOLN: 0.2000 mg | Freq: Once | INTRAMUSCULAR | Status: DC | PRN

## 2015-06-27 MED ORDER — LACTATED RINGERS IV SOLN
INTRAVENOUS | Status: DC
  Administered 2015-06-27 (×2): via INTRAVENOUS

## 2015-06-27 MED ORDER — PROPOFOL 10 MG/ML IV BOLUS
INTRAVENOUS | Status: DC | PRN
  Administered 2015-06-27: 200 mg via INTRAVENOUS

## 2015-06-27 MED ORDER — MIDAZOLAM HCL 2 MG/2ML IJ SOLN
INTRAMUSCULAR | Status: AC
  Filled 2015-06-27: qty 2

## 2015-06-27 MED ORDER — ACETAMINOPHEN 10 MG/ML IV SOLN
1000.0000 mg | INTRAVENOUS | Status: AC
  Administered 2015-06-27: 1000 mg via INTRAVENOUS

## 2015-06-27 MED ORDER — ONDANSETRON HCL 4 MG PO TABS: 4.0000 mg | ORAL_TABLET | Freq: Four times a day (QID) | ORAL | Status: DC | PRN

## 2015-06-27 MED ORDER — OXYCODONE HCL 10 MG PO TABS: 10.0000 mg | ORAL_TABLET | ORAL | Status: DC | PRN

## 2015-06-27 MED ORDER — ACETAMINOPHEN 325 MG PO TABS
650.0000 mg | ORAL_TABLET | Freq: Four times a day (QID) | ORAL | Status: DC | PRN
  Administered 2015-06-27: 650 mg via ORAL
  Filled 2015-06-27: qty 2

## 2015-06-27 MED ORDER — SODIUM CHLORIDE 0.9 % IV SOLN: INTRAVENOUS | Status: DC

## 2015-06-27 MED ORDER — SIMVASTATIN 20 MG PO TABS: 20.0000 mg | ORAL_TABLET | Freq: Every morning | ORAL | Status: DC

## 2015-06-27 MED ORDER — PROPOFOL 500 MG/50ML IV EMUL
INTRAVENOUS | Status: AC
  Filled 2015-06-27: qty 50

## 2015-06-27 MED ORDER — ACETAMINOPHEN 10 MG/ML IV SOLN: 1000.0000 mg | Freq: Once | INTRAVENOUS | Status: DC

## 2015-06-27 MED ORDER — CEFAZOLIN SODIUM-DEXTROSE 2-3 GM-% IV SOLR
2.0000 g | INTRAVENOUS | Status: AC
  Administered 2015-06-27: 2 g via INTRAVENOUS

## 2015-06-27 MED ORDER — DEXAMETHASONE SODIUM PHOSPHATE 10 MG/ML IJ SOLN
INTRAMUSCULAR | Status: AC
  Filled 2015-06-27: qty 1

## 2015-06-27 MED ORDER — FENTANYL CITRATE (PF) 100 MCG/2ML IJ SOLN
INTRAMUSCULAR | Status: AC
  Filled 2015-06-27: qty 2

## 2015-06-27 MED ORDER — DOCUSATE SODIUM 100 MG PO CAPS
100.0000 mg | ORAL_CAPSULE | Freq: Two times a day (BID) | ORAL | Status: DC
  Administered 2015-06-28: 100 mg via ORAL
  Filled 2015-06-27: qty 1

## 2015-06-27 MED ORDER — KETOROLAC TROMETHAMINE 30 MG/ML IJ SOLN
INTRAMUSCULAR | Status: AC
  Filled 2015-06-27: qty 1

## 2015-06-27 MED ORDER — SENNA 8.6 MG PO TABS
1.0000 | ORAL_TABLET | Freq: Two times a day (BID) | ORAL | Status: DC
  Administered 2015-06-28: 8.6 mg via ORAL
  Filled 2015-06-27: qty 1

## 2015-06-27 MED ORDER — 0.9 % SODIUM CHLORIDE (POUR BTL) OPTIME
TOPICAL | Status: DC | PRN
  Administered 2015-06-27: 400 mL

## 2015-06-27 MED ORDER — LIDOCAINE HCL (CARDIAC) 20 MG/ML IV SOLN
INTRAVENOUS | Status: DC | PRN
  Administered 2015-06-27: 60 mg via INTRAVENOUS

## 2015-06-27 MED ORDER — CEFAZOLIN SODIUM-DEXTROSE 2-3 GM-% IV SOLR
INTRAVENOUS | Status: AC
  Filled 2015-06-27: qty 50

## 2015-06-27 MED ORDER — CHLORHEXIDINE GLUCONATE 4 % EX LIQD: 60.0000 mL | Freq: Once | CUTANEOUS | Status: DC

## 2015-06-27 MED ORDER — ONDANSETRON HCL 4 MG/2ML IJ SOLN
INTRAMUSCULAR | Status: AC
  Filled 2015-06-27: qty 2

## 2015-06-27 MED ORDER — OXYCODONE HCL 5 MG PO TABS
10.0000 mg | ORAL_TABLET | ORAL | Status: DC | PRN
  Administered 2015-06-27 – 2015-06-28 (×4): 15 mg via ORAL
  Filled 2015-06-27 (×4): qty 3

## 2015-06-27 MED ORDER — LORATADINE 10 MG PO TABS: 10.0000 mg | ORAL_TABLET | Freq: Every day | ORAL | Status: DC

## 2015-06-27 MED ORDER — MIDAZOLAM HCL 2 MG/2ML IJ SOLN
INTRAMUSCULAR | Status: AC
  Filled 2015-06-27: qty 4

## 2015-06-27 SURGICAL SUPPLY — 109 items
BANDAGE ESMARK 6X9 LF (GAUZE/BANDAGES/DRESSINGS) ×3 IMPLANT
BIT DRILL 2.5X2.75 QC CALB (BIT) ×2 IMPLANT
BLADE ARTHRO LOK 4 BEAVER (BLADE) IMPLANT
BLADE ARTHRO LOK 4MM BEAVER (BLADE)
BLADE AVERAGE 25MMX9MM (BLADE)
BLADE AVERAGE 25X9 (BLADE) IMPLANT
BLADE CCA MICRO SAG (BLADE) IMPLANT
BLADE LONG MED 25X9 (BLADE) ×4 IMPLANT
BLADE LONG MED 25X9MM (BLADE) ×1
BLADE MICRO SAGITTAL (BLADE) ×5 IMPLANT
BLADE OSC/SAG .038X5.5 CUT EDG (BLADE) ×5 IMPLANT
BLADE SURG 15 STRL LF DISP TIS (BLADE) ×9 IMPLANT
BLADE SURG 15 STRL SS (BLADE) ×15
BNDG CMPR 9X6 STRL LF SNTH (GAUZE/BANDAGES/DRESSINGS) ×3
BNDG COHESIVE 4X5 TAN STRL (GAUZE/BANDAGES/DRESSINGS) ×5 IMPLANT
BNDG COHESIVE 6X5 TAN STRL LF (GAUZE/BANDAGES/DRESSINGS) ×5 IMPLANT
BNDG CONFORM 2 STRL LF (GAUZE/BANDAGES/DRESSINGS) IMPLANT
BNDG CONFORM 3 STRL LF (GAUZE/BANDAGES/DRESSINGS) ×5 IMPLANT
BNDG ESMARK 6X9 LF (GAUZE/BANDAGES/DRESSINGS) ×5
BUR EGG 3PK/BX (BURR) IMPLANT
CANISTER SUCT 1200ML W/VALVE (MISCELLANEOUS) ×5 IMPLANT
CHLORAPREP W/TINT 26ML (MISCELLANEOUS) ×5 IMPLANT
COVER BACK TABLE 60X90IN (DRAPES) ×5 IMPLANT
CUFF TOURNIQUET SINGLE 34IN LL (TOURNIQUET CUFF) ×5 IMPLANT
DECANTER SPIKE VIAL GLASS SM (MISCELLANEOUS) IMPLANT
DRAPE C-ARM 42X72 X-RAY (DRAPES) IMPLANT
DRAPE C-ARMOR (DRAPES) IMPLANT
DRAPE EXTREMITY T 121X128X90 (DRAPE) ×5 IMPLANT
DRAPE OEC MINIVIEW 54X84 (DRAPES) ×5 IMPLANT
DRAPE U-SHAPE 47X51 STRL (DRAPES) ×5 IMPLANT
DRSG MEPITEL 4X7.2 (GAUZE/BANDAGES/DRESSINGS) ×5 IMPLANT
DRSG PAD ABDOMINAL 8X10 ST (GAUZE/BANDAGES/DRESSINGS) ×10 IMPLANT
ELECT REM PT RETURN 9FT ADLT (ELECTROSURGICAL) ×5
ELECTRODE REM PT RTRN 9FT ADLT (ELECTROSURGICAL) ×3 IMPLANT
GAUZE SPONGE 4X4 12PLY STRL (GAUZE/BANDAGES/DRESSINGS) ×5 IMPLANT
GLOVE BIO SURGEON STRL SZ8 (GLOVE) ×5 IMPLANT
GLOVE BIOGEL PI IND STRL 7.0 (GLOVE) IMPLANT
GLOVE BIOGEL PI IND STRL 8 (GLOVE) ×6 IMPLANT
GLOVE BIOGEL PI INDICATOR 7.0 (GLOVE) ×2
GLOVE BIOGEL PI INDICATOR 8 (GLOVE) ×4
GLOVE ECLIPSE 6.5 STRL STRAW (GLOVE) ×4 IMPLANT
GLOVE ECLIPSE 7.5 STRL STRAW (GLOVE) ×5 IMPLANT
GLOVE EXAM NITRILE MD LF STRL (GLOVE) ×2 IMPLANT
GOWN STRL REUS W/ TWL LRG LVL3 (GOWN DISPOSABLE) ×3 IMPLANT
GOWN STRL REUS W/ TWL XL LVL3 (GOWN DISPOSABLE) ×6 IMPLANT
GOWN STRL REUS W/TWL LRG LVL3 (GOWN DISPOSABLE) ×5
GOWN STRL REUS W/TWL XL LVL3 (GOWN DISPOSABLE) ×10
GUIDEWIRE 1.1X6IN (WIRE) ×2 IMPLANT
K-WIRE .054X4 (WIRE) IMPLANT
NDL SAFETY ECLIPSE 18X1.5 (NEEDLE) IMPLANT
NDL SUT 6 .5 CRC .975X.05 MAYO (NEEDLE) IMPLANT
NEEDLE HYPO 18GX1.5 SHARP (NEEDLE) ×5
NEEDLE HYPO 22GX1.5 SAFETY (NEEDLE) ×4 IMPLANT
NEEDLE MAYO TAPER (NEEDLE) ×5
NS IRRIG 1000ML POUR BTL (IV SOLUTION) ×5 IMPLANT
OSSEOTI RECONSTRUCTIVE WEDGE 15 X 20 X 4 MM ×2 IMPLANT
PACK BASIN DAY SURGERY FS (CUSTOM PROCEDURE TRAY) ×5 IMPLANT
PAD CAST 4YDX4 CTTN HI CHSV (CAST SUPPLIES) ×3 IMPLANT
PADDING CAST ABS 4INX4YD NS (CAST SUPPLIES) ×2
PADDING CAST ABS COTTON 4X4 ST (CAST SUPPLIES) IMPLANT
PADDING CAST COTTON 4X4 STRL (CAST SUPPLIES) ×5
PADDING CAST COTTON 6X4 STRL (CAST SUPPLIES) ×5 IMPLANT
PASSER SUT SWANSON 36MM LOOP (INSTRUMENTS) ×2 IMPLANT
PENCIL BUTTON HOLSTER BLD 10FT (ELECTRODE) ×5 IMPLANT
PIN GUIDE DRILL TIP 2.8X300 (DRILL) ×2 IMPLANT
PLATE ACE 3.5MM 2HOLE (Plate) ×2 IMPLANT
RETRIEVER SUT HEWSON (MISCELLANEOUS) IMPLANT
SANITIZER HAND PURELL 535ML FO (MISCELLANEOUS) ×5 IMPLANT
SCREW ACUTRAK FUSION 24MM (Screw) ×2 IMPLANT
SCREW CANN RATTLER 5X20 (Screw) ×2 IMPLANT
SCREW CANNULATED PT 8.0 X 50 MM ×2 IMPLANT
SCREW CANNULATED PT 8.0 X 55 MM ×2 IMPLANT
SCREW CORT FT 32X3.5XNONLOCK (Screw) IMPLANT
SCREW CORTICAL 3.5MM  32MM (Screw) ×4 IMPLANT
SCREW CORTICAL 3.5MM 32MM (Screw) ×6 IMPLANT
SCREW FUSION ACUTRAK 30 3000 (Screw) IMPLANT
SCREW FUSION ACUTRAK 30MM 3000 (Screw) ×5 IMPLANT
SCREW QUICK SNAP 2.0X14MM (Screw) ×4 IMPLANT
SHEET MEDIUM DRAPE 40X70 STRL (DRAPES) ×7 IMPLANT
SLEEVE SCD COMPRESS KNEE MED (MISCELLANEOUS) ×5 IMPLANT
SPLINT FAST PLASTER 5X30 (CAST SUPPLIES) ×40
SPLINT PLASTER CAST FAST 5X30 (CAST SUPPLIES) ×60 IMPLANT
SPONGE LAP 18X18 X RAY DECT (DISPOSABLE) ×5 IMPLANT
STAPLER VISISTAT 35W (STAPLE) IMPLANT
STOCKINETTE 6  STRL (DRAPES) ×2
STOCKINETTE 6 STRL (DRAPES) ×3 IMPLANT
SUCTION FRAZIER TIP 10 FR DISP (SUCTIONS) ×5 IMPLANT
SUT 2 FIBERLOOP 20 STRT BLUE (SUTURE)
SUT BONE WAX W31G (SUTURE) IMPLANT
SUT ETHIBOND 2 OS 4 DA (SUTURE) IMPLANT
SUT ETHILON 3 0 PS 1 (SUTURE) ×9 IMPLANT
SUT FIBERWIRE #2 38 T-5 BLUE (SUTURE)
SUT FIBERWIRE 2-0 18 17.9 3/8 (SUTURE)
SUT MERSILENE 2.0 SH NDLE (SUTURE) IMPLANT
SUT MNCRL AB 3-0 PS2 18 (SUTURE) ×9 IMPLANT
SUT PROLENE 0 CT 2 (SUTURE) IMPLANT
SUT VIC AB 0 SH 27 (SUTURE) ×5 IMPLANT
SUT VIC AB 2-0 SH 27 (SUTURE) ×5
SUT VIC AB 2-0 SH 27XBRD (SUTURE) IMPLANT
SUTURE 2 FIBERLOOP 20 STRT BLU (SUTURE) IMPLANT
SUTURE FIBERWR #2 38 T-5 BLUE (SUTURE) IMPLANT
SUTURE FIBERWR 2-0 18 17.9 3/8 (SUTURE) IMPLANT
SYR BULB 3OZ (MISCELLANEOUS) ×5 IMPLANT
SYR CONTROL 10ML LL (SYRINGE) ×2 IMPLANT
TOWEL OR 17X24 6PK STRL BLUE (TOWEL DISPOSABLE) ×12 IMPLANT
TUBE CONNECTING 20'X1/4 (TUBING) ×1
TUBE CONNECTING 20X1/4 (TUBING) ×4 IMPLANT
UNDERPAD 30X30 (UNDERPADS AND DIAPERS) ×5 IMPLANT
YANKAUER SUCT BULB TIP NO VENT (SUCTIONS) IMPLANT

## 2015-06-27 NOTE — Transfer of Care (Signed)
Immediate Anesthesia Transfer of Care Note  Patient: Craig Scott  Procedure(s) Performed: Procedure(s): RIGHT GASTROC RECESSION/POSTERIOR TIBIAL TENOLYSIS/SECOND AND THIRD METATARSAL WEIL OSTEOTOMY AND HAMMERTOE CORRECTION (Right) FLEXOR DIGITORUM LONGUS TRANFER TO NAVICULAR  (Right) CALCANEAL OSTEOTOMY (Right)  Patient Location: PACU  Anesthesia Type:General  Level of Consciousness: awake and patient cooperative  Airway & Oxygen Therapy: Patient Spontanous Breathing and Patient connected to face mask oxygen  Post-op Assessment: Report given to RN and Post -op Vital signs reviewed and stable  Post vital signs: Reviewed and stable  Last Vitals:  Filed Vitals:   06/27/15 0631  BP:   Pulse:   Temp: 36.6 C  Resp:     Complications: No apparent anesthesia complications

## 2015-06-27 NOTE — Brief Op Note (Signed)
06/27/2015  9:50 AM  PATIENT:  Craig Scott  59 y.o. male  PRE-OPERATIVE DIAGNOSIS:  right flatfoot deformity, tight heelcord and posterior tibial tendonitis; 2nd and 3rd hammertoe deformities  POST-OPERATIVE DIAGNOSIS:  right flatfoot deformity, tight heelcord and posterior tibial tendonitis; 2nd and 3rd hammertoe deformities  Procedure(s): 1.  RIGHT GASTROC RECESSION (separate incision) 2.  Right POSTERIOR TIBIAL TENOLYSIS 3.  Right FLEXOR DIGITORUM LONGUS TRANFER TO NAVICULAR  4.  Right medializing CALCANEAL OSTEOTOMY (separate incision) 5.  Right medial cuneiform open wedge osteotomy (separate incision) 6.  Right SECOND AND THIRD METATARSAL WEIL OSTEOTOMies (separate incisions) 7.  Right 2nd and 3rd HAMMERTOE CORRECTIONs (separate incisions) 8.  Right 2nd and 3rd MTPJ dorsal capsulotomies and extensor tendon lengthenings 9.  3 view xrays of the right foot  SURGEON:  Wylene Simmer, MD  ASSISTANT: Mechele Claude, PA-C  ANESTHESIA:   General, regional  EBL:  minimal   TOURNIQUET:  2:00 at 483 mm Hg  COMPLICATIONS:  None apparent  DISPOSITION:  Extubated, awake and stable to recovery.  DICTATION ID:  [id number]

## 2015-06-27 NOTE — Anesthesia Procedure Notes (Signed)
Procedure Name: LMA Insertion Date/Time: 06/27/2015 7:38 AM Performed by: Gilma Bessette D Pre-anesthesia Checklist: Patient identified, Emergency Drugs available, Suction available and Patient being monitored Patient Re-evaluated:Patient Re-evaluated prior to inductionOxygen Delivery Method: Circle System Utilized Preoxygenation: Pre-oxygenation with 100% oxygen Intubation Type: IV induction Ventilation: Mask ventilation without difficulty LMA: LMA inserted LMA Size: 5.0 Number of attempts: 1 Airway Equipment and Method: Bite block Placement Confirmation: positive ETCO2 Tube secured with: Tape Dental Injury: Teeth and Oropharynx as per pre-operative assessment

## 2015-06-27 NOTE — Discharge Instructions (Signed)
John Hewitt, MD °[facility] ° °Please read the following information regarding your care after surgery. ° °Medications  °You only need a prescription for the narcotic pain medicine (ex. oxycodone, Percocet, Norco).  All of the other medicines listed below are available over the counter. °X acetominophen (Tylenol) 650 mg every 4-6 hours as you need for minor pain °X oxycodone as prescribed for moderate to severe pain °?  ° °Narcotic pain medicine (ex. oxycodone, Percocet, Vicodin) will cause constipation.  To prevent this problem, take the following medicines while you are taking any pain medicine. °X docusate sodium (Colace) 100 mg twice a day X senna (Senokot) 2 tablets twice a day ° °X To help prevent blood clots, take an aspirin (325 mg) once a day for a month after surgery.  You should also get up every hour while you are awake to move around.   ° °Weight Bearing °X Do not bear any weight on the operated leg or foot. ° °Cast / Splint / Dressing °X Keep your splint or cast clean and dry.  Don’t put anything (coat hanger, pencil, etc) down inside of it.  If it gets damp, use a hair dryer on the cool setting to dry it.  If it gets soaked, call the office to schedule an appointment for a cast change. °  ° °After your dressing, cast or splint is removed; you may shower, but do not soak or scrub the wound.  Allow the water to run over it, and then gently pat it dry. ° °Swelling °It is normal for you to have swelling where you had surgery.  To reduce swelling and pain, keep your toes above your nose for at least 3 days after surgery.  It may be necessary to keep your foot or leg elevated for several weeks.  If it hurts, it should be elevated. ° °Follow Up °Call my office at [phone] when you are discharged from the hospital or surgery center to schedule an appointment to be seen two weeks after surgery. ° °Call my office at [phone] if you develop a fever >101.5° F, nausea, vomiting, bleeding from  the surgical site or severe pain.   ° ° °

## 2015-06-27 NOTE — H&P (Signed)
Craig Scott is an 59 y.o. male.   Chief Complaint: right foot pain HPI: 59 y/o male with right flatfoot deformity, tight heelcord and posterior tibial tendonitis.  He presents now for flatfoot reconstruction.  He also has 2nd and 3rd hammertoe deformities that he'd like to get fixed as well.  Past Medical History  Diagnosis Date  . High cholesterol   . GERD (gastroesophageal reflux disease)   . Anxiety   . Neuropathy ([place])     both feet  . Arthritis     ra  . Lynch syndrome     has gene that causes cancer    Past Surgical History  Procedure Laterality Date  . Knee surgery Right     bilat   . Colonoscopy   12/09/01    RMR: Normal normal rectum/ A few scattered left-sided diverticula.  The remainder of the colonic  mucosa appeared normal  . Colonoscopy  12/15/2002    RMR: Normal rectum, scattered left-sided diverticula.  The remainder of the colonic mucosa appeared normal  . Colonoscopy  04/10/2004    RMR: Normal rectum/Sigmoid diverticula/The remainder of the colonic mucosa appeared normal  . Esophagogastroduodenoscopy  12/23/2005    RMR:   Esophagogastric peptic stricture with reflux esophagitis, status post  dilation as described above/ Small hiatal hernia, otherwise normal stomach.  Bulbar erosions otherwise normal D1 and D2  . Colonoscopy  12/23/2005    RMR: Normal rectum, scattered sigmoid diverticula Colonic mucosa appeared normal  . Colonoscopy    01/20/2008    RMR: Distal diminutive rectal polyps, status post cold biopsy removal  otherwise, normal rectum/ biopsy removal; scattered sigmoid diverticula; and benign-appearing/ulcers about the ileocecal valve, status post biopsy.  Remainder of colonic mucosa and terminal mucosa appeared normal.  . Colonoscopy  05/22/2010    RMR: distal diminutive rectal polyp s/p bx otherwise normal/few scattered pancolonic diverticula  . Nose surgery    . Colonoscopy  09/16/2012    Procedure: COLONOSCOPY;  Surgeon: Daneil Dolin, MD;   Location: [facility];  Service: Endoscopy;  Laterality: N/A;  7:30  . Eye surgery  as child  . Tympanostomy tube placement Right     early 2014  . Joint replacement  09/30/2010    Right knee  . Total knee arthroplasty Left 10/23/2013    Procedure: LEFT TOTAL KNEE ARTHROPLASTY;  Surgeon: [name], MD;  Location: [facility];  Service: Orthopedics;  Laterality: Left;  . Total hip arthroplasty Left 03/20/2015    Procedure: LEFT TOTAL HIP ARTHROPLASTY ANTERIOR APPROACH;  Surgeon: Gaynelle Arabian, MD;  Location: [facility];  Service: Orthopedics;  Laterality: Left;    Family History  Problem Relation Age of Onset  . Colon cancer Brother     67   . Colon cancer Brother     80  . Colon cancer Cousin     49 years old onset  . Colon cancer Cousin     49 years onset  . Colon cancer Mother     age 23    Social History:  reports that he quit smoking about 9 years ago. His smoking use included Cigars and Cigarettes. He quit after 30 years of use. He has never used smokeless tobacco. He reports that he drinks alcohol. He reports that he does not use illicit drugs.  Allergies:  Allergies  Allergen Reactions  . Lyrica [Pregabalin] Other (See Comments)    "changes personality"  . Celebrex [Celecoxib] Rash  Medications Prior to Admission  Medication Sig Dispense Refill  . ALPRAZolam (XANAX) 1 MG tablet Take 1 mg by mouth every 6 (six) hours as needed for anxiety. anxiety    . cetirizine (ZYRTEC) 10 MG tablet Take 10 mg by mouth daily.    . meloxicam (MOBIC) 15 MG tablet Take 15 mg by mouth daily.    Marland Kitchen omeprazole (PRILOSEC) 20 MG capsule Take 20 mg by mouth daily.    . simvastatin (ZOCOR) 20 MG tablet Take 20 mg by mouth every morning.     Marland Kitchen oxyCODONE 10 MG TABS Take 1-2 tablets (10-20 mg total) by mouth every 3 (three) hours as needed for moderate pain or severe pain. 80 tablet 0  . POTASSIUM GLUCONATE PO Take 1 tablet by mouth daily.      No results found for this or any previous  visit (from the past 48 hour(s)). No results found.  ROS  No recent f/c/n/v/ wtloss  Blood pressure 128/81, pulse 86, temperature 97.9 F (36.6 C), temperature source Oral, resp. rate 18, height 6' (1.829 m), weight 110.678 kg (244 lb), SpO2 98 %. Physical Exam  wn wd male in nad.  A and O x 4.  Mood and affect normal.  EOMI.  Resp unlabored.  R foot with healthy skin.  No lymphadenopathy.  5/5 strength in PF and DF of the ankle and toes.  Normal sens to LT at the forefoot.  2nd and 3rd toes are in a fixed hammertoe position.  Assessment/Plan R flatfoot deformity, tight heelcord, posterior tibial tendonitis and 2nd and 3rd hammertoes.  To OR for flatfoot reconstruction, gastroc recession and correction of hammertoes.  The risks and benefits of the alternative treatment options have been discussed in detail.  The patient wishes to proceed with surgery and specifically understands risks of bleeding, infection, nerve damage, blood clots, need for additional surgery, amputation and death.   Wylene Simmer 03-Jul-2015, 7:17 AM

## 2015-06-27 NOTE — Anesthesia Postprocedure Evaluation (Signed)
  Anesthesia Post-op Note  Patient: Craig Scott  Procedure(s) Performed: Procedure(s): RIGHT GASTROC RECESSION/POSTERIOR TIBIAL TENOLYSIS/SECOND AND THIRD METATARSAL WEIL OSTEOTOMY AND HAMMERTOE CORRECTION (Right) FLEXOR DIGITORUM LONGUS TRANFER TO NAVICULAR  (Right) CALCANEAL OSTEOTOMY (Right)  Patient Location: PACU  Anesthesia Type:General  Level of Consciousness: awake, alert  and oriented  Airway and Oxygen Therapy: Patient Spontanous Breathing  Post-op Pain: moderate  Post-op Assessment: Post-op Vital signs reviewed and Patient's Cardiovascular Status Stable     RLE Motor Response: Purposeful movement RLE Sensation: Full sensation (numbness in toes)      Post-op Vital Signs: Reviewed and stable  Last Vitals:  Filed Vitals:   06/27/15 1145  BP: 141/82  Pulse: 82  Temp:   Resp: 18    Complications: No apparent anesthesia complications

## 2015-06-28 ENCOUNTER — Encounter (HOSPITAL_BASED_OUTPATIENT_CLINIC_OR_DEPARTMENT_OTHER): Payer: Self-pay | Admitting: Orthopedic Surgery

## 2015-06-28 DIAGNOSIS — M2141 Flat foot [pes planus] (acquired), right foot: Secondary | ICD-10-CM | POA: Diagnosis not present

## 2015-06-28 NOTE — Addendum Note (Signed)
Addendum  created 06/28/15 0810 by Ernesta Amble Ambur Province, CRNA   Modules edited: Charges VN

## 2015-06-28 NOTE — Op Note (Signed)
NAME:  Craig Scott, Craig Scott              ACCOUNT NO.:  [id number]  MEDICAL RECORD NO.:  [id number]  LOCATION:                                 FACILITY:  PHYSICIAN:  Wylene Simmer, MD        DATE OF BIRTH:  1955-12-23  DATE OF PROCEDURE:  06/27/2015 DATE OF DISCHARGE:                              OPERATIVE REPORT   PREOPERATIVE DIAGNOSES: 1. Right flatfoot deformity. 2. Tight right heel cord. 3. Right posterior tibial tendinitis. 4. Right second hammertoe deformity and metatarsalgia. 5. Right third hammertoe deformity and metatarsalgia.  POSTOPERATIVE DIAGNOSES: 1. Right flatfoot deformity. 2. Tight right heel cord. 3. Right posterior tibial tendinitis. 4. Right second hammertoe deformity and metatarsalgia. 5. Right third hammertoe deformity and metatarsalgia.  PROCEDURE: 1. Right gastrocnemius recession through a separate incision. 2. Right posterior tibial tendon tenolysis. 3. Right flexor digitorum longus transfer to the navicular. 4. Right medializing calcaneal osteotomy through a separate incision. 5. Right medial cuneiform opening wedge osteotomy through a separate     incision. 6. Right second and third metatarsal Weil osteotomies through separate     incisions. 7. Right second and third hammertoe corrections through separate     incisions. 8. Right second and third MTP joint dorsal capsulotomies and extensor     tendon lengthening. 9. Three view radiographs of the right foot.  SURGEON:  Wylene Simmer, M.D.  ASSISTANT:  Mechele Claude, PA-C.  ANESTHESIA:  General, regional.  ESTIMATED BLOOD LOSS:  Minimal.  TOURNIQUET TIME:  2 hours at 250 mmHg.  COMPLICATIONS:  None apparent.  DISPOSITION:  Extubated, awake, and stable to recovery.  INDICATIONS FOR PROCEDURE:  The patient is a 59 year old male with a long history of right hindfoot and forefoot deformity and pain.  He has failed nonoperative treatment to date including activity modification, oral  anti-inflammatories, shoe wear modification, and physical therapy. He presents now for operative treatment of these painful conditions.  He understands the risks and benefits, the alternative treatment options, and elects surgical treatment.  He specifically understands risks of bleeding, infection, nerve damage, blood clots, need for additional surgery, continued pain, recurrence of his deformities, amputation, and death.  PROCEDURE IN DETAIL:  After preoperative consent was obtained and the correct operative site was identified, the patient was brought to the operating room and placed supine on the operating table.  General anesthesia was induced.  Preoperative antibiotics were administered. Surgical time-out was taken.  The right lower extremity was prepped and draped in standard sterile fashion with tourniquet around the thigh. The extremity was exsanguinated.  The tourniquet was inflated to 250 mmHg.  The medial incision was made on the calf, sharp dissection was carried down through the skin and subcutaneous tissue.  The saphenous nerve and vein were protected.  The gastrocnemius tendon was identified along with the plantaris tendon.  Both were divided from medial to lateral under direct vision.  Wound was irrigated and closed with Monocryl and nylon.  Attention was then turned to the lateral aspect of the heel where an oblique incision was made.  Sharp dissection was carried down through skin, subcutaneous tissue, and periosteum.  The periosteum was then elevated.  Retractors were  placed superiorly and inferiorly over the tuberosity.  An osteotomy was made with the oscillating saw.  The calcaneal tuberosity was translated medially approximately 8 mm.  The osteotomy was fixed with 2 partially threaded 8 mm cannulated screws from the Biomet set.  Both noted to have excellent purchase.  The lateral wall cut surface of bone was smoothed with an osteotome and impactor.  The wound was  irrigated and closed with nylon.  Attention was then turned to the medial aspect of the ankle, where curvilinear incision was made over the posterior tibial tendon sheath. Sharp dissection was carried down through skin and subcutaneous tissue. The tendon sheath was incised.  The posterior tibial tendon was noted to have significant tenosynovitis.  This was debrided sharply with scissors and a knife.  The flexor digitorum longus tendon was then identified. It was dissected into the level of the midfoot and transected just proximal from the knot of Henry.  A whipstitch was placed in the end. The tendon was measured.  A 5 mm Biomet Rattler interference screw was selected.  The tendon was then kept moistened.  The forefoot was noted to still have residual supination after correction of the hindfoot deformity.  The decision was made to proceed with a cotton osteotomy of the medial cuneiform.  A longitudinal incision was made dorsally.  Sharp dissection was carried down through skin and subcutaneous tissue.  The interval between the extensor hallucis longus and tibialis anterior tendon was developed.  A K-wire was placed in the central portion of the cuneiform to mark the osteotomy.  Radiographs confirmed appropriate position of the osteotomy. The osteotomy was then made with an oscillating saw and 2 osteotomes were used to open the osteotomy approximately 4 mm.  A trial of 4 mm wedge was inserted.  This was noted to have the appropriate effect on plantar flexion of the first ray.  A 4 mm Biomet OsseoTi wedge was then inserted and impacted into position.  A one-third tubular plate was then applied dorsally and secured with 2 fully-threaded 3.5 mm cortical screws.  These were from Biomet small frag set.  Attention was then returned to the medial aspect of the ankle.  A K-wire was placed through the navicular tuberosity.  This was confirmed on radiographs and then over drilled with a 5.2 mm  drill bit.  The flexor digitorum longus tendon was then pulled up through the hole in the navicular and secured with the interference screw.  The end of the tendon was then sutured to the adjacent periosteum.  Wound was then irrigated copiously.  The posterior tibial tendon sheath was closed with 0 Vicryl simple sutures.  Subcutaneous tissues were approximated with Monocryl and skin incisions were closed with nylon.  The dorsal incision was closed in the same fashion.  Attention was then turned to the forefoot where the second and third hammertoe deformities were identified.  Longitudinal incisions were made over the second and third MTP joints.  Sharp dissection was carried down through skin and subcutaneous tissue.  The extensor tendons were lengthened in Z-fashion and the dorsal capsules were excised.  The second metatarsal head was exposed and an oscillating saw was used to create a Weil osteotomy removing a small wedge of bone distally.  The osteotomy was then fixed with an Acumed twist-off screw.  The same procedure was then repeated through separate incision for the third MTP joint.  Attention was then turned to the second toe where a transverse incision was made over  the PIP joint.  Sharp dissection was carried down through the skin and subcutaneous tissue.  The head of the proximal phalanx and the base of the middle phalanx were resected with the oscillating saw. The osteotomy was fixed with an Acumed Acutrak screw placed to the tip of the toe.  It was noted have excellent compression across the PIP joint.  The same procedure was repeated for the third toe through a separate transverse incision.  Final AP and lateral radiographs of the forefoot and midfoot and Harris heel radiograph of the hindfoot was obtained.  These show interval correction of the flatfoot deformity with appropriately positioned hardware and no evidence of acute injury.  The second and third toes were noted  to have corrected hammertoe deformities with appropriately positioned hardware.  Sterile dressings were applied followed by a well-padded short-leg splint.  Tourniquet was released at approximately 2 hours.  The patient was awakened from anesthesia and transported to the recovery room in stable condition.  FOLLOWUP PLAN:  The patient will be observed overnight for pain control. He will follow up with me in the office in 2 weeks for suture removal and conversion to a cast.  RADIOGRAPHS:  AP and lateral radiographs of the forefoot, AP and lateral radiographs of the midfoot, and lateral and Harris heel radiographs of the hindfoot were obtained today intraoperatively.  These show interval correction of the flatfoot and hammertoe deformities with no evidence of acute injury.  Hardware is appropriately positioned and of the appropriate length.  Mechele Claude, PA-C, who was present and scrubbed for the duration of the case.  His assistance was essential in positioning the patient, prepping and draping, gaining and maintaining exposure, performing the operation, closing and dressing the wounds, and applying the splint.     Wylene Simmer, MD     JH/MEDQ  D:  06/27/2015  T:  06/28/2015  Job:  [id number]

## 2015-08-27 ENCOUNTER — Other Ambulatory Visit: Payer: Self-pay

## 2015-08-27 ENCOUNTER — Encounter: Payer: Self-pay | Admitting: Internal Medicine

## 2015-08-27 ENCOUNTER — Ambulatory Visit (INDEPENDENT_AMBULATORY_CARE_PROVIDER_SITE_OTHER): Payer: BC Managed Care – PPO | Admitting: Internal Medicine

## 2015-08-27 VITALS — BP 137/78 | HR 88 | Temp 98.3°F | Ht 70.0 in | Wt 251.6 lb

## 2015-08-27 DIAGNOSIS — K219 Gastro-esophageal reflux disease without esophagitis: Secondary | ICD-10-CM

## 2015-08-27 DIAGNOSIS — Z1509 Genetic susceptibility to other malignant neoplasm: Secondary | ICD-10-CM | POA: Diagnosis not present

## 2015-08-27 DIAGNOSIS — R160 Hepatomegaly, not elsewhere classified: Secondary | ICD-10-CM

## 2015-08-27 DIAGNOSIS — Z86010 Personal history of colonic polyps: Secondary | ICD-10-CM

## 2015-08-27 MED ORDER — PEG 3350-KCL-NA BICARB-NACL 420 G PO SOLR: 4000.0000 mL | Freq: Once | ORAL | Status: DC

## 2015-08-27 NOTE — Patient Instructions (Signed)
Schedule surveillance colonoscopy (history of polyps - Lynch syndrome)  Will need Propofol  Split Prep -  prior to Christmas  Hepatic ultrasound  - hepatomegaly

## 2015-08-27 NOTE — Progress Notes (Signed)
  Primary Care Physician:  FUSCO,LAWRENCE J., MD Primary Gastroenterologist:  Dr. Ash Mcelwain  Pre-Procedure History & Physical: HPI:  Craig Scott is a 59 y.o. male with Lynch syndrome presents for consideration of a surveillance colonoscopy. He had multiple colonoscopies in the past and is been noted to have early interval development of significant colorectal adenomas. Last colonoscopy nearly 3 years ago; he is overdue for surveillance. He has no rectal bleeding, abdominal pain or other lower GI tract symptoms. Long-standing GERD for which he takes omeprazole 20 mg twice daily. History of a peptic stricture. No dysphagia currently. He has a long history of heavy daily beer consumption although he states he's cut back significantly over the past 1 year. He has had multiple orthopedic procedures. History multiple family members with colon cancer. Labs including LFTs look good from July of this year. Noted to have hepatomegaly on physical examination when he was previously here-did not follow through on recommended hepatic ultrasound.  Past Medical History  Diagnosis Date  . High cholesterol   . GERD (gastroesophageal reflux disease)   . Anxiety   . Neuropathy (HCC)     both feet  . Arthritis     ra  . Lynch syndrome     has gene that causes cancer  . Tubular adenoma     Past Surgical History  Procedure Laterality Date  . Knee surgery Right     bilat   . Colonoscopy   12/09/01    RMR: Normal normal rectum/ A few scattered left-sided diverticula.  The remainder of the colonic  mucosa appeared normal  . Colonoscopy  12/15/2002    RMR: Normal rectum, scattered left-sided diverticula.  The remainder of the colonic mucosa appeared normal  . Colonoscopy  04/10/2004    RMR: Normal rectum/Sigmoid diverticula/The remainder of the colonic mucosa appeared normal  . Esophagogastroduodenoscopy  12/23/2005    RMR:   Esophagogastric peptic stricture with reflux esophagitis, status post  dilation as  described above/ Small hiatal hernia, otherwise normal stomach.  Bulbar erosions otherwise normal D1 and D2  . Colonoscopy  12/23/2005    RMR: Normal rectum, scattered sigmoid diverticula Colonic mucosa appeared normal  . Colonoscopy    01/20/2008    RMR: Distal diminutive rectal polyps, status post cold biopsy removal  otherwise, normal rectum/ biopsy removal; scattered sigmoid diverticula; and benign-appearing/ulcers about the ileocecal valve, status post biopsy.  Remainder of colonic mucosa and terminal mucosa appeared normal.  . Colonoscopy  05/22/2010    RMR: distal diminutive rectal polyp s/p bx otherwise normal/few scattered pancolonic diverticula  . Nose surgery    . Colonoscopy  09/16/2012    Dr.Chayse Zatarain- normal rectum, few scattered pancolonic diverticulosis, one diminutive polyp in the base of the cecum. 3x3 area of hyper pigmentation i the mid descending segment, this was a flat benign-appearing area o/w the remainder of the colonic mucosa appeared normal. bx= tubular adenoma and benign colonic mucosa  . Eye surgery  as child  . Tympanostomy tube placement Right     early 2014  . Joint replacement  09/30/2010    Right knee  . Total knee arthroplasty Left 10/23/2013    Procedure: LEFT TOTAL KNEE ARTHROPLASTY;  Surgeon: Frank V Aluisio, MD;  Location: [facility];  Service: Orthopedics;  Laterality: Left;  . Total hip arthroplasty Left 03/20/2015    Procedure: LEFT TOTAL HIP ARTHROPLASTY ANTERIOR APPROACH;  Surgeon: Frank Aluisio, MD;  Location: [facility];  Service: Orthopedics;  Laterality: Left;  . Bunionectomy with   hammertoe reconstruction and gastroc slide Right 06/27/2015    Procedure: RIGHT GASTROC RECESSION/POSTERIOR TIBIAL TENOLYSIS/SECOND AND THIRD METATARSAL WEIL OSTEOTOMY AND HAMMERTOE CORRECTION;  Surgeon: John Hewitt, MD;  Location: [facility];  Service: Orthopedics;  Laterality: Right;  . Calcaneal osteotomy Right 06/27/2015    Procedure: CALCANEAL OSTEOTOMY;  Surgeon: John  Hewitt, MD;  Location: [facility];  Service: Orthopedics;  Laterality: Right;    Prior to Admission medications   Medication Sig Start Date End Date Taking? Authorizing Provider  ALPRAZolam (XANAX) 1 MG tablet Take 1 mg by mouth every 6 (six) hours as needed for anxiety. anxiety   Yes Historical Provider, MD  aspirin EC 325 MG tablet Take 1 tablet (325 mg total) by mouth daily. 06/27/15  Yes Justin Pike Ollis, PA-C  cetirizine (ZYRTEC) 10 MG tablet Take 10 mg by mouth daily.   Yes Historical Provider, MD  docusate sodium (COLACE) 100 MG capsule Take 1 capsule (100 mg total) by mouth 2 (two) times daily. While taking narcotic pain medicine. 06/27/15  Yes Justin Pike Ollis, PA-C  omeprazole (PRILOSEC) 20 MG capsule Take 20 mg by mouth daily.   Yes Historical Provider, MD  Oxycodone HCl 10 MG TABS Take 1 tablet (10 mg total) by mouth every 4 (four) hours as needed for moderate pain or severe pain. 06/27/15  Yes Justin Pike Ollis, PA-C  senna (SENOKOT) 8.6 MG TABS tablet Take 2 tablets (17.2 mg total) by mouth 2 (two) times daily. 06/27/15  Yes Justin Pike Ollis, PA-C  simvastatin (ZOCOR) 20 MG tablet Take 20 mg by mouth every morning.    Yes Historical Provider, MD  POTASSIUM GLUCONATE PO Take 1 tablet by mouth daily.    Historical Provider, MD    Allergies as of 08/27/2015 - Review Complete 08/27/2015  Allergen Reaction Noted  . Lyrica [pregabalin] Other (See Comments) 03/14/2015  . Celebrex [celecoxib] Rash 01/12/2013    Family History  Problem Relation Age of Onset  . Colon cancer Brother     49   . Colon cancer Brother     59  . Colon cancer Cousin     27 years old onset  . Colon cancer Cousin     49 years onset  . Colon cancer Mother     age 46     Social History   Social History  . Marital Status: Married    Spouse Name: N/A  . Number of Children: 2  . Years of Education: 12   Occupational History  . [employer]     [place]   Social History Main  Topics  . Smoking status: Former Smoker -- 30 years    Types: Cigars, Cigarettes    Quit date: 09/14/2005  . Smokeless tobacco: Never Used     Comment: quit 4-5 years ago  . Alcohol Use: Yes     Comment: daily, multiple beers in the evening   . Drug Use: No     Comment: past use of marijuana  . Sexual Activity: Not on file   Other Topics Concern  . Not on file   Social History Narrative    Review of Systems: See HPI, otherwise negative ROS  Physical Exam: BP 137/78 mmHg  Pulse 88  Temp(Src) 98.3 F (36.8 C) (Oral)  Ht 5' 10" (1.778 m)  Wt 251 lb 9.6 oz (114.125 kg)  BMI 36.10 kg/m2 General:   Alert,   pleasant and cooperative in NAD Eyes:  Sclera clear, no   icterus.   Conjunctiva pink. Neck:  Supple; no masses or thyromegaly. No significant cervical adenopathy. Lungs:  Clear throughout to auscultation.   No wheezes, crackles, or rhonchi. No acute distress. Heart:  Regular rate and rhythm; no murmurs, clicks, rubs,  or gallops. Abdomen: Non-distended, normal bowel sounds.  Soft and nontender ;  liver edge is palpable 3 fingerbreadths below the right costal margin. Margin is smooth without tenderness. I do not appreciate a spleen or other abnormality.   Pulses:  Normal pulses noted. Extremities:  Without clubbing or edema.  Wearing a brace right ankle.  Impression:   Pleasant 59-year-old gentleman with Lynch syndrome. History of colonic adenoma. He is actually overdue for surveillance colonoscopy. GERD symptoms well controlled on omeprazole 20 mg twice daily. Hepatomegaly in the setting of a fairly heavy regular alcohol use in the way of beer.  GERD symptoms well controlled on twice a day omeprazole. History of hepatomegaly. Previously recommended ultrasound not done yet. LFTs normal.  Recommendations:   He needs a surveillance colonoscopy now. Because of challenges with adequate conscious sedation previously, will enlist the help of [facility]. [facility] for deep sedation  for this case.The risks, benefits, limitations, alternatives and imponderables have been reviewed with the patient. Questions have been answered. All parties are agreeable.   Continue omeprazole twice daily for GERD. Limited alcohol consumption recommended  Hepatic ultrasound in the near future. Further recommendations to follow.     Notice: This dictation was prepared with Dragon dictation along with smaller phrase technology. Any transcriptional errors that result from this process are unintentional and may not be corrected upon review. 

## 2015-08-30 ENCOUNTER — Observation Stay (HOSPITAL_COMMUNITY)
Admission: RE | Admit: 2015-08-30 | Discharge: 2015-08-30 | Disposition: A | Discharge status: Home / Self Care | Payer: BC Managed Care – PPO | Source: Ambulatory Visit | Attending: Internal Medicine | Admitting: Internal Medicine

## 2015-08-30 DIAGNOSIS — R160 Hepatomegaly, not elsewhere classified: Secondary | ICD-10-CM | POA: Diagnosis present

## 2015-09-02 ENCOUNTER — Telehealth: Payer: Self-pay | Admitting: Internal Medicine

## 2015-09-02 NOTE — Patient Instructions (Signed)
Craig Scott  09/02/2015     @PREFPERIOPPHARMACY @   Your procedure is scheduled on  09/05/2015  Report to [facility] at  830  A.M.  Call this number if you have problems the morning of surgery:  [phone]   Remember:  Do not eat food or drink liquids after midnight.  Take these medicines the morning of surgery with A SIP OF WATER  Xanax, zyrtec, mobic, prilosec.   Do not wear jewelry, make-up or nail polish.  Do not wear lotions, powders, or perfumes.  You may wear deodorant.  Do not shave 48 hours prior to surgery.  Men may shave face and neck.  Do not bring valuables to the hospital.  [facility] is not responsible for any belongings or valuables.  Contacts, dentures or bridgework may not be worn into surgery.  Leave your suitcase in the car.  After surgery it may be brought to your room.  For patients admitted to the hospital, discharge time will be determined by your treatment team.  Patients discharged the day of surgery will not be allowed to drive home.   Name and phone number of your driver:   family Special instructions:  Follow the diet and prep instructions given to you by Dr Roseanne Kaufman office.  Please read over the following fact sheets that you were given. Pain Booklet, Coughing and Deep Breathing, Surgical Site Infection Prevention, Anesthesia Post-op Instructions and Care and Recovery After Surgery      Colonoscopy A colonoscopy is an exam to look at the entire large intestine (colon). This exam can help find problems such as tumors, polyps, inflammation, and areas of bleeding. The exam takes about 1 hour.  LET [facility] CARE PROVIDER KNOW ABOUT:   Any allergies you have.  All medicines you are taking, including vitamins, herbs, eye drops, creams, and over-the-counter medicines.  Previous problems you or members of your family have had with the use of anesthetics.  Any blood disorders you have.  Previous surgeries you have  had.  Medical conditions you have. RISKS AND COMPLICATIONS  Generally, this is a safe procedure. However, as with any procedure, complications can occur. Possible complications include:  Bleeding.  Tearing or rupture of the colon wall.  Reaction to medicines given during the exam.  Infection (rare). BEFORE THE PROCEDURE   Ask your health care provider about changing or stopping your regular medicines.  You may be prescribed an oral bowel prep. This involves drinking a large amount of medicated liquid, starting the day before your procedure. The liquid will cause you to have multiple loose stools until your stool is almost clear or light green. This cleans out your colon in preparation for the procedure.  Do not eat or drink anything else once you have started the bowel prep, unless your health care provider tells you it is safe to do so.  Arrange for someone to drive you home after the procedure. PROCEDURE   You will be given medicine to help you relax (sedative).  You will lie on your side with your knees bent.  A long, flexible tube with a light and camera on the end (colonoscope) will be inserted through the rectum and into the colon. The camera sends video back to a computer screen as it moves through the colon. The colonoscope also releases carbon dioxide gas to inflate the colon. This helps your health care provider see the area better.  During the exam, your health care  provider may take a small tissue sample (biopsy) to be examined under a microscope if any abnormalities are found.  The exam is finished when the entire colon has been viewed. AFTER THE PROCEDURE   Do not drive for 24 hours after the exam.  You may have a small amount of blood in your stool.  You may pass moderate amounts of gas and have mild abdominal cramping or bloating. This is caused by the gas used to inflate your colon during the exam.  Ask when your test results will be ready and how you will  get your results. Make sure you get your test results.   This information is not intended to replace advice given to you by your health care provider. Make sure you discuss any questions you have with your health care provider.   Document Released: 08/28/2000 Document Revised: 06/21/2013 Document Reviewed: 05/08/2013 Elsevier Interactive Patient Education 2016 Elsevier Inc. PATIENT INSTRUCTIONS POST-ANESTHESIA  IMMEDIATELY FOLLOWING SURGERY:  Do not drive or operate machinery for the first twenty four hours after surgery.  Do not make any important decisions for twenty four hours after surgery or while taking narcotic pain medications or sedatives.  If you develop intractable nausea and vomiting or a severe headache please notify your doctor immediately.  FOLLOW-UP:  Please make an appointment with your surgeon as instructed. You do not need to follow up with anesthesia unless specifically instructed to do so.  WOUND CARE INSTRUCTIONS (if applicable):  Keep a dry clean dressing on the anesthesia/puncture wound site if there is drainage.  Once the wound has quit draining you may leave it open to air.  Generally you should leave the bandage intact for twenty four hours unless there is drainage.  If the epidural site drains for more than 36-48 hours please call the anesthesia department.  QUESTIONS?:  Please feel free to call your physician or the hospital operator if you have any questions, and they will be happy to assist you.

## 2015-09-03 ENCOUNTER — Encounter (HOSPITAL_COMMUNITY)
Admission: RE | Admit: 2015-09-03 | Discharge: 2015-09-03 | Disposition: A | Discharge status: Home / Self Care | Payer: BC Managed Care – PPO | Source: Ambulatory Visit | Attending: Internal Medicine | Admitting: Internal Medicine

## 2015-09-03 ENCOUNTER — Encounter (HOSPITAL_COMMUNITY): Payer: Self-pay

## 2015-09-03 ENCOUNTER — Other Ambulatory Visit: Payer: Self-pay

## 2015-09-03 DIAGNOSIS — Z01810 Encounter for preprocedural cardiovascular examination: Secondary | ICD-10-CM | POA: Diagnosis not present

## 2015-09-03 DIAGNOSIS — Z1509 Genetic susceptibility to other malignant neoplasm: Secondary | ICD-10-CM | POA: Diagnosis not present

## 2015-09-03 DIAGNOSIS — D125 Benign neoplasm of sigmoid colon: Secondary | ICD-10-CM | POA: Diagnosis not present

## 2015-09-03 DIAGNOSIS — E7800 Pure hypercholesterolemia, unspecified: Secondary | ICD-10-CM | POA: Diagnosis not present

## 2015-09-03 DIAGNOSIS — F419 Anxiety disorder, unspecified: Secondary | ICD-10-CM | POA: Diagnosis not present

## 2015-09-03 DIAGNOSIS — Z01812 Encounter for preprocedural laboratory examination: Secondary | ICD-10-CM | POA: Diagnosis not present

## 2015-09-03 DIAGNOSIS — Z7982 Long term (current) use of aspirin: Secondary | ICD-10-CM | POA: Diagnosis not present

## 2015-09-03 DIAGNOSIS — M069 Rheumatoid arthritis, unspecified: Secondary | ICD-10-CM | POA: Diagnosis not present

## 2015-09-03 DIAGNOSIS — Z87891 Personal history of nicotine dependence: Secondary | ICD-10-CM | POA: Diagnosis not present

## 2015-09-03 DIAGNOSIS — D120 Benign neoplasm of cecum: Secondary | ICD-10-CM | POA: Diagnosis not present

## 2015-09-03 DIAGNOSIS — K5730 Diverticulosis of large intestine without perforation or abscess without bleeding: Secondary | ICD-10-CM | POA: Diagnosis not present

## 2015-09-03 DIAGNOSIS — Z800 Family history of malignant neoplasm of digestive organs: Secondary | ICD-10-CM | POA: Diagnosis not present

## 2015-09-03 DIAGNOSIS — Z79899 Other long term (current) drug therapy: Secondary | ICD-10-CM | POA: Diagnosis not present

## 2015-09-03 DIAGNOSIS — Z1211 Encounter for screening for malignant neoplasm of colon: Secondary | ICD-10-CM | POA: Diagnosis not present

## 2015-09-03 DIAGNOSIS — Z96653 Presence of artificial knee joint, bilateral: Secondary | ICD-10-CM | POA: Diagnosis not present

## 2015-09-03 DIAGNOSIS — Z96642 Presence of left artificial hip joint: Secondary | ICD-10-CM | POA: Diagnosis not present

## 2015-09-03 DIAGNOSIS — G629 Polyneuropathy, unspecified: Secondary | ICD-10-CM | POA: Diagnosis not present

## 2015-09-03 DIAGNOSIS — K219 Gastro-esophageal reflux disease without esophagitis: Secondary | ICD-10-CM | POA: Diagnosis not present

## 2015-09-03 LAB — CBC WITH DIFFERENTIAL/PLATELET
BASOS PCT: 0 %
Basophils Absolute: 0 10*3/uL (ref 0.0–0.1)
EOS ABS: 0.2 10*3/uL (ref 0.0–0.7)
Eosinophils Relative: 4 %
HCT: 40.2 % (ref 39.0–52.0)
Hemoglobin: 13.9 g/dL (ref 13.0–17.0)
Lymphocytes Relative: 20 %
Lymphs Abs: 1.3 10*3/uL (ref 0.7–4.0)
MCH: 30.2 pg (ref 26.0–34.0)
MCHC: 34.6 g/dL (ref 30.0–36.0)
MCV: 87.4 fL (ref 78.0–100.0)
MONO ABS: 0.7 10*3/uL (ref 0.1–1.0)
MONOS PCT: 11 %
Neutro Abs: 4 10*3/uL (ref 1.7–7.7)
Neutrophils Relative %: 65 %
Platelets: 246 10*3/uL (ref 150–400)
RBC: 4.6 MIL/uL (ref 4.22–5.81)
RDW: 12.6 % (ref 11.5–15.5)
WBC: 6.2 10*3/uL (ref 4.0–10.5)

## 2015-09-03 LAB — BASIC METABOLIC PANEL
Anion gap: 7 (ref 5–15)
BUN: 14 mg/dL (ref 6–20)
CALCIUM: 8.9 mg/dL (ref 8.9–10.3)
CO2: 25 mmol/L (ref 22–32)
CREATININE: 0.79 mg/dL (ref 0.61–1.24)
Chloride: 102 mmol/L (ref 101–111)
GFR calc non Af Amer: >60 mL/min (ref >60)
Glucose, Bld: 125 mg/dL — ABNORMAL HIGH (ref 65–99)
Potassium: 3.9 mmol/L (ref 3.5–5.1)
SODIUM: 134 mmol/L — AB (ref 135–145)

## 2015-09-03 NOTE — Pre-Procedure Instructions (Signed)
Patient given information to sign up for my chart. 

## 2015-09-04 NOTE — Telephone Encounter (Signed)
i

## 2015-09-05 ENCOUNTER — Other Ambulatory Visit: Payer: Self-pay | Admitting: Internal Medicine

## 2015-09-05 ENCOUNTER — Encounter (HOSPITAL_COMMUNITY): Payer: Self-pay | Admitting: *Deleted

## 2015-09-05 ENCOUNTER — Ambulatory Visit (HOSPITAL_COMMUNITY)
Admission: RE | Admit: 2015-09-05 | Discharge: 2015-09-05 | Disposition: A | Discharge status: Home / Self Care | Payer: BC Managed Care – PPO | Source: Ambulatory Visit | Attending: Internal Medicine | Admitting: Internal Medicine

## 2015-09-05 ENCOUNTER — Ambulatory Visit (HOSPITAL_COMMUNITY): Payer: BC Managed Care – PPO | Admitting: Anesthesiology

## 2015-09-05 ENCOUNTER — Encounter (HOSPITAL_COMMUNITY)
Admission: RE | Disposition: A | Discharge status: Home / Self Care | Payer: Self-pay | Source: Ambulatory Visit | Attending: Internal Medicine

## 2015-09-05 ENCOUNTER — Other Ambulatory Visit: Payer: Self-pay

## 2015-09-05 DIAGNOSIS — Z96653 Presence of artificial knee joint, bilateral: Secondary | ICD-10-CM | POA: Insufficient documentation

## 2015-09-05 DIAGNOSIS — Z1509 Genetic susceptibility to other malignant neoplasm: Secondary | ICD-10-CM | POA: Insufficient documentation

## 2015-09-05 DIAGNOSIS — G629 Polyneuropathy, unspecified: Secondary | ICD-10-CM | POA: Insufficient documentation

## 2015-09-05 DIAGNOSIS — M069 Rheumatoid arthritis, unspecified: Secondary | ICD-10-CM | POA: Insufficient documentation

## 2015-09-05 DIAGNOSIS — Z01812 Encounter for preprocedural laboratory examination: Secondary | ICD-10-CM | POA: Insufficient documentation

## 2015-09-05 DIAGNOSIS — Z800 Family history of malignant neoplasm of digestive organs: Secondary | ICD-10-CM | POA: Insufficient documentation

## 2015-09-05 DIAGNOSIS — Z8481 Family history of carrier of genetic disease: Secondary | ICD-10-CM | POA: Diagnosis not present

## 2015-09-05 DIAGNOSIS — K635 Polyp of colon: Secondary | ICD-10-CM

## 2015-09-05 DIAGNOSIS — Z86010 Personal history of colonic polyps: Secondary | ICD-10-CM | POA: Insufficient documentation

## 2015-09-05 DIAGNOSIS — K219 Gastro-esophageal reflux disease without esophagitis: Secondary | ICD-10-CM | POA: Insufficient documentation

## 2015-09-05 DIAGNOSIS — R160 Hepatomegaly, not elsewhere classified: Secondary | ICD-10-CM

## 2015-09-05 DIAGNOSIS — Z96642 Presence of left artificial hip joint: Secondary | ICD-10-CM | POA: Insufficient documentation

## 2015-09-05 DIAGNOSIS — Z1211 Encounter for screening for malignant neoplasm of colon: Secondary | ICD-10-CM | POA: Insufficient documentation

## 2015-09-05 DIAGNOSIS — Z01810 Encounter for preprocedural cardiovascular examination: Secondary | ICD-10-CM | POA: Insufficient documentation

## 2015-09-05 DIAGNOSIS — Z79899 Other long term (current) drug therapy: Secondary | ICD-10-CM | POA: Insufficient documentation

## 2015-09-05 DIAGNOSIS — F419 Anxiety disorder, unspecified: Secondary | ICD-10-CM | POA: Insufficient documentation

## 2015-09-05 DIAGNOSIS — K5730 Diverticulosis of large intestine without perforation or abscess without bleeding: Secondary | ICD-10-CM | POA: Insufficient documentation

## 2015-09-05 DIAGNOSIS — E7800 Pure hypercholesterolemia, unspecified: Secondary | ICD-10-CM | POA: Insufficient documentation

## 2015-09-05 DIAGNOSIS — D125 Benign neoplasm of sigmoid colon: Secondary | ICD-10-CM | POA: Insufficient documentation

## 2015-09-05 DIAGNOSIS — D120 Benign neoplasm of cecum: Secondary | ICD-10-CM | POA: Insufficient documentation

## 2015-09-05 DIAGNOSIS — Z7982 Long term (current) use of aspirin: Secondary | ICD-10-CM | POA: Insufficient documentation

## 2015-09-05 DIAGNOSIS — Z87891 Personal history of nicotine dependence: Secondary | ICD-10-CM | POA: Insufficient documentation

## 2015-09-05 HISTORY — PX: POLYPECTOMY: SHX5525

## 2015-09-05 HISTORY — PX: COLONOSCOPY WITH PROPOFOL: SHX5780

## 2015-09-05 SURGERY — COLONOSCOPY WITH PROPOFOL
Anesthesia: Monitor Anesthesia Care

## 2015-09-05 MED ORDER — MIDAZOLAM HCL 2 MG/2ML IJ SOLN
INTRAMUSCULAR | Status: AC
  Filled 2015-09-05: qty 2

## 2015-09-05 MED ORDER — LACTATED RINGERS IV SOLN
INTRAVENOUS | Status: DC
  Administered 2015-09-05: 10:00:00 via INTRAVENOUS

## 2015-09-05 MED ORDER — PROPOFOL 10 MG/ML IV BOLUS
INTRAVENOUS | Status: AC
Start: 2015-09-05 — End: 2015-09-05
  Filled 2015-09-05: qty 20

## 2015-09-05 MED ORDER — MIDAZOLAM HCL 5 MG/5ML IJ SOLN
INTRAMUSCULAR | Status: DC | PRN
  Administered 2015-09-05: 2 mg via INTRAVENOUS

## 2015-09-05 MED ORDER — PROPOFOL 10 MG/ML IV BOLUS
INTRAVENOUS | Status: AC
  Filled 2015-09-05: qty 20

## 2015-09-05 MED ORDER — LIDOCAINE HCL (CARDIAC) 10 MG/ML IV SOLN
INTRAVENOUS | Status: DC | PRN
  Administered 2015-09-05: 50 mg via INTRAVENOUS

## 2015-09-05 MED ORDER — PROPOFOL 500 MG/50ML IV EMUL
INTRAVENOUS | Status: DC | PRN
  Administered 2015-09-05: 150 ug/kg/min via INTRAVENOUS
  Administered 2015-09-05: 100 ug/kg/min via INTRAVENOUS

## 2015-09-05 MED ORDER — MIDAZOLAM HCL 2 MG/2ML IJ SOLN
1.0000 mg | INTRAMUSCULAR | Status: DC | PRN
  Administered 2015-09-05: 2 mg via INTRAVENOUS

## 2015-09-05 NOTE — Interval H&P Note (Signed)
History and Physical Interval Note:  09/05/2015 9:41 AM  Craig Scott  has presented today for surgery, with the diagnosis of history of colon polyps, lynch syndrome  The various methods of treatment have been discussed with the patient and family. After consideration of risks, benefits and other options for treatment, the patient has consented to  Procedure(s) with comments: COLONOSCOPY WITH PROPOFOL (N/A) - 0830 as a surgical intervention .  The patient's history has been reviewed, patient examined, no change in status, stable for surgery.  I have reviewed the patient's chart and labs.  Questions were answered to the patient's satisfaction.     [name]  No change; high risk screening TCS; hx of lynch; The risks, benefits, limitations, alternatives and imponderables have been reviewed with the patient. Questions have been answered. All parties are agreeable.

## 2015-09-05 NOTE — Op Note (Signed)
[facility] [address] [place], [address]   COLONOSCOPY PROCEDURE REPORT  PATIENT: Craig Scott, Craig Scott  MR#: [id number] BIRTHDATE: 1956/05/25 , 38  yrs. old GENDER: male ENDOSCOPIST: R.  Garfield Cornea, MD FACP [facility] REFERRED KC:LEXNT Gerarda Fraction, M.D. PROCEDURE DATE:  09/28/2015 PROCEDURE:   Colonoscopy with snare polypectomy INDICATIONS:Surveillance examination; history of colonic adenoma; history of Lynch syndrome. MEDICATIONS: Deep sedation per Dr.  [facility] ASA CLASS:       Class II  CONSENT: The risks, benefits, alternatives and imponderables including but not limited to bleeding, perforation as well as the possibility of a missed lesion have been reviewed.  The potential for biopsy, lesion removal, etc. have also been discussed. Questions have been answered.  All parties agreeable.  Please see the history and physical in the medical record for more information.  DESCRIPTION OF PROCEDURE:   After the risks benefits and alternatives of the procedure were thoroughly explained, informed consent was obtained.  The digital rectal exam revealed no abnormalities of the rectum.   The EC-[id number] ([id number])  endoscope was introduced through the anus and advanced to the cecum, which was identified by both the appendix and ileocecal valve. No adverse events experienced.   The quality of the prep was adequate  The instrument was then slowly withdrawn as the colon was fully examined. Estimated blood loss is zero unless otherwise noted in this procedure report.      COLON FINDINGS: Normal-appearing rectal mucosa.  (1) 8 mm pedunculated polyp in the mid sigmoid segment and (1) 3 mm polyp in the base of the cecum.  Patient had rare colonic diverticula; otherwise, the remainder of the colonic mucosa appeared normal.  The above-mentioned polyps were cold snare removed.  Retroflexion was performed. .  Withdrawal time=15 minutes 0 seconds.  The scope was  withdrawn and the procedure completed. COMPLICATIONS: There were no immediate complications.  ENDOSCOPIC IMPRESSION: Colonic polyps?"removed as described above. Rare colonic diverticula  RECOMMENDATIONS: Follow-up on pathology. At a minimum, repeat surveillance colonoscopy in 18 months. Patient should not wait any longer than 18 months for his next examination  eSigned:  R. Garfield Cornea, MD [facility] 09/28/2015 11:31 AM   cc:  CPT CODES: ICD CODES:  The ICD and CPT codes recommended by this software are interpretations from the data that the clinical staff has captured with the software.  The verification of the translation of this report to the ICD and CPT codes and modifiers is the sole responsibility of the health care institution and practicing physician where this report was generated.  [employer]. will not be held responsible for the validity of the ICD and CPT codes included on this report.  AMA assumes no liability for data contained or not contained herein. CPT is a Designer, television/film set of the [employer].  PATIENT NAME:  Craig Scott, Craig Scott MR#: [id number]

## 2015-09-05 NOTE — Discharge Instructions (Signed)
Colonoscopy Discharge Instructions  Read the instructions outlined below and refer to this sheet in the next few weeks. These discharge instructions provide you with general information on caring for yourself after you leave the hospital. Your doctor may also give you specific instructions. While your treatment has been planned according to the most current medical practices available, unavoidable complications occasionally occur. If you have any problems or questions after discharge, call Dr. Gala Romney at [phone]. ACTIVITY  You may resume your regular activity, but move at a slower pace for the next 24 hours.   Take frequent rest periods for the next 24 hours.   Walking will help get rid of the air and reduce the bloated feeling in your belly (abdomen).   No driving for 24 hours (because of the medicine (anesthesia) used during the test).    Do not sign any important legal documents or operate any machinery for 24 hours (because of the anesthesia used during the test).  NUTRITION  Drink plenty of fluids.   You may resume your normal diet as instructed by your doctor.   Begin with a light meal and progress to your normal diet. Heavy or fried foods are harder to digest and may make you feel sick to your stomach (nauseated).   Avoid alcoholic beverages for 24 hours or as instructed.  MEDICATIONS  You may resume your normal medications unless your doctor tells you otherwise.  WHAT YOU CAN EXPECT TODAY  Some feelings of bloating in the abdomen.   Passage of more gas than usual.   Spotting of blood in your stool or on the toilet paper.  IF YOU HAD POLYPS REMOVED DURING THE COLONOSCOPY:  No aspirin products for 7 days or as instructed.   No alcohol for 7 days or as instructed.   Eat a soft diet for the next 24 hours.  FINDING OUT THE RESULTS OF YOUR TEST Not all test results are available during your visit. If your test results are not back during the visit, make an appointment  with your caregiver to find out the results. Do not assume everything is normal if you have not heard from your caregiver or the medical facility. It is important for you to follow up on all of your test results.  SEEK IMMEDIATE MEDICAL ATTENTION IF:  You have more than a spotting of blood in your stool.   Your belly is swollen (abdominal distention).   You are nauseated or vomiting.   You have a temperature over 101.   You have abdominal pain or discomfort that is severe or gets worse throughout the day.   Polyp information provided  Further recommendations to follow pending review of pathology report  You should plan Colon Polyps Polyps are lumps of extra tissue growing inside the body. Polyps can grow in the large intestine (colon). Most colon polyps are noncancerous (benign). However, some colon polyps can become cancerous over time. Polyps that are larger than a pea may be harmful. To be safe, caregivers remove and test all polyps. CAUSES  Polyps form when mutations in the genes cause your cells to grow and divide even though no more tissue is needed. RISK FACTORS There are a number of risk factors that can increase your chances of getting colon polyps. They include:  Being older than 50 years.  Family history of colon polyps or colon cancer.  Long-term colon diseases, such as colitis or Crohn disease.  Being overweight.  Smoking.  Being inactive.  Drinking too much  alcohol. SYMPTOMS  Most small polyps do not cause symptoms. If symptoms are present, they may include:  Blood in the stool. The stool may look dark red or black.  Constipation or diarrhea that lasts longer than 1 week. DIAGNOSIS People often do not know they have polyps until their caregiver finds them during a regular checkup. Your caregiver can use 4 tests to check for polyps:  Digital rectal exam. The caregiver wears gloves and feels inside the rectum. This test would find polyps only in the  rectum.  Barium enema. The caregiver puts a liquid called barium into your rectum before taking X-rays of your colon. Barium makes your colon look white. Polyps are dark, so they are easy to see in the X-ray pictures.  Sigmoidoscopy. A thin, flexible tube (sigmoidoscope) is placed into your rectum. The sigmoidoscope has a light and tiny camera in it. The caregiver uses the sigmoidoscope to look at the last third of your colon.  Colonoscopy. This test is like sigmoidoscopy, but the caregiver looks at the entire colon. This is the most common method for finding and removing polyps. TREATMENT  Any polyps will be removed during a sigmoidoscopy or colonoscopy. The polyps are then tested for cancer. PREVENTION  To help lower your risk of getting more colon polyps:  Eat plenty of fruits and vegetables. Avoid eating fatty foods.  Do not smoke.  Avoid drinking alcohol.  Exercise every day.  Lose weight if recommended by your caregiver.  Eat plenty of calcium and folate. Foods that are rich in calcium include milk, cheese, and broccoli. Foods that are rich in folate include chickpeas, kidney beans, and spinach. HOME CARE INSTRUCTIONS Keep all follow-up appointments as directed by your caregiver. You may need periodic exams to check for polyps. SEEK MEDICAL CARE IF: You notice bleeding during a bowel movement.   This information is not intended to replace advice given to you by your health care provider. Make sure you discuss any questions you have with your health care provider.   Document Released: 05/27/2004 Document Revised: 09/21/2014 Document Reviewed: 11/10/2011 Elsevier Interactive Patient Education [employer].  to return in 18 months for a repeat colonoscopy regardless of report on polyps removed today

## 2015-09-05 NOTE — Transfer of Care (Signed)
Immediate Anesthesia Transfer of Care Note  Patient: Craig Scott  Procedure(s) Performed: Procedure(s) with comments: COLONOSCOPY WITH PROPOFOL (N/A) - 0830 POLYPECTOMY  Patient Location: PACU  Anesthesia Type:MAC  Level of Consciousness: awake and patient cooperative  Airway & Oxygen Therapy: Patient Spontanous Breathing and Patient connected to nasal cannula oxygen  Post-op Assessment: Report given to RN, Post -op Vital signs reviewed and stable and Patient moving all extremities  Post vital signs: Reviewed and stable    Complications: No apparent anesthesia complications

## 2015-09-05 NOTE — H&P (View-Only) (Signed)
Primary Care Physician:  Glo Herring., MD Primary Gastroenterologist:  Dr. Gala Romney  Pre-Procedure History & Physical: HPI:  Craig Scott is a 59 y.o. male with Lynch syndrome presents for consideration of a surveillance colonoscopy. He had multiple colonoscopies in the past and is been noted to have early interval development of significant colorectal adenomas. Last colonoscopy nearly 3 years ago; he is overdue for surveillance. He has no rectal bleeding, abdominal pain or other lower GI tract symptoms. Long-standing GERD for which he takes omeprazole 20 mg twice daily. History of a peptic stricture. No dysphagia currently. He has a long history of heavy daily beer consumption although he states he's cut back significantly over the past 1 year. He has had multiple orthopedic procedures. History multiple family members with colon cancer. Labs including LFTs look good from July of this year. Noted to have hepatomegaly on physical examination when he was previously here-did not follow through on recommended hepatic ultrasound.  Past Medical History  Diagnosis Date  . High cholesterol   . GERD (gastroesophageal reflux disease)   . Anxiety   . Neuropathy ([place])     both feet  . Arthritis     ra  . Lynch syndrome     has gene that causes cancer  . Tubular adenoma     Past Surgical History  Procedure Laterality Date  . Knee surgery Right     bilat   . Colonoscopy   12/09/01    RMR: Normal normal rectum/ A few scattered left-sided diverticula.  The remainder of the colonic  mucosa appeared normal  . Colonoscopy  12/15/2002    RMR: Normal rectum, scattered left-sided diverticula.  The remainder of the colonic mucosa appeared normal  . Colonoscopy  04/10/2004    RMR: Normal rectum/Sigmoid diverticula/The remainder of the colonic mucosa appeared normal  . Esophagogastroduodenoscopy  12/23/2005    RMR:   Esophagogastric peptic stricture with reflux esophagitis, status post  dilation as  described above/ Small hiatal hernia, otherwise normal stomach.  Bulbar erosions otherwise normal D1 and D2  . Colonoscopy  12/23/2005    RMR: Normal rectum, scattered sigmoid diverticula Colonic mucosa appeared normal  . Colonoscopy    01/20/2008    RMR: Distal diminutive rectal polyps, status post cold biopsy removal  otherwise, normal rectum/ biopsy removal; scattered sigmoid diverticula; and benign-appearing/ulcers about the ileocecal valve, status post biopsy.  Remainder of colonic mucosa and terminal mucosa appeared normal.  . Colonoscopy  05/22/2010    RMR: distal diminutive rectal polyp s/p bx otherwise normal/few scattered pancolonic diverticula  . Nose surgery    . Colonoscopy  09/16/2012    Dr.Melora Menon- normal rectum, few scattered pancolonic diverticulosis, one diminutive polyp in the base of the cecum. 3x3 area of hyper pigmentation i the mid descending segment, this was a flat benign-appearing area o/w the remainder of the colonic mucosa appeared normal. bx= tubular adenoma and benign colonic mucosa  . Eye surgery  as child  . Tympanostomy tube placement Right     early 2014  . Joint replacement  09/30/2010    Right knee  . Total knee arthroplasty Left 10/23/2013    Procedure: LEFT TOTAL KNEE ARTHROPLASTY;  Surgeon: [name], MD;  Location: [facility];  Service: Orthopedics;  Laterality: Left;  . Total hip arthroplasty Left 03/20/2015    Procedure: LEFT TOTAL HIP ARTHROPLASTY ANTERIOR APPROACH;  Surgeon: Gaynelle Arabian, MD;  Location: [facility];  Service: Orthopedics;  Laterality: Left;  . Bunionectomy with  hammertoe reconstruction and gastroc slide Right 06/27/2015    Procedure: RIGHT GASTROC RECESSION/POSTERIOR TIBIAL TENOLYSIS/SECOND AND THIRD METATARSAL WEIL OSTEOTOMY AND HAMMERTOE CORRECTION;  Surgeon: Wylene Simmer, MD;  Location: [place];  Service: Orthopedics;  Laterality: Right;  . Calcaneal osteotomy Right 06/27/2015    Procedure: CALCANEAL OSTEOTOMY;  Surgeon: Wylene Simmer, MD;  Location: [facility] [place] [facility];  Service: Orthopedics;  Laterality: Right;    Prior to Admission medications   Medication Sig Start Date End Date Taking? Authorizing Provider  ALPRAZolam Duanne Moron) 1 MG tablet Take 1 mg by mouth every 6 (six) hours as needed for anxiety. anxiety   Yes Historical Provider, MD  aspirin EC 325 MG tablet Take 1 tablet (325 mg total) by mouth daily. 06/27/15  Yes Marya Amsler Ollis, PA-C  cetirizine (ZYRTEC) 10 MG tablet Take 10 mg by mouth daily.   Yes Historical Provider, MD  docusate sodium (COLACE) 100 MG capsule Take 1 capsule (100 mg total) by mouth 2 (two) times daily. While taking narcotic pain medicine. 06/27/15  Yes Marya Amsler Ollis, PA-C  omeprazole (PRILOSEC) 20 MG capsule Take 20 mg by mouth daily.   Yes Historical Provider, MD  Oxycodone HCl 10 MG TABS Take 1 tablet (10 mg total) by mouth every 4 (four) hours as needed for moderate pain or severe pain. 06/27/15  Yes Justin Pike Ollis, PA-C  senna (SENOKOT) 8.6 MG TABS tablet Take 2 tablets (17.2 mg total) by mouth 2 (two) times daily. 06/27/15  Yes Marya Amsler Ollis, PA-C  simvastatin (ZOCOR) 20 MG tablet Take 20 mg by mouth every morning.    Yes Historical Provider, MD  POTASSIUM GLUCONATE PO Take 1 tablet by mouth daily.    Historical Provider, MD    Allergies as of 08/27/2015 - Review Complete 08/27/2015  Allergen Reaction Noted  . Lyrica [pregabalin] Other (See Comments) 03/14/2015  . Celebrex [celecoxib] Rash 01/12/2013    Family History  Problem Relation Age of Onset  . Colon cancer Brother     60   . Colon cancer Brother     22  . Colon cancer Cousin     105 years old onset  . Colon cancer Cousin     49 years onset  . Colon cancer Mother     age 10     Social History   Social History  . Marital Status: Married    Spouse Name: N/A  . Number of Children: 2  . Years of Education: 12   Occupational History  . [employer]     [place]   Social History Main  Topics  . Smoking status: Former Smoker -- 30 years    Types: 56, Cigarettes    Quit date: 09/14/2005  . Smokeless tobacco: Never Used     Comment: quit 4-5 years ago  . Alcohol Use: Yes     Comment: daily, multiple beers in the evening   . Drug Use: No     Comment: past use of marijuana  . Sexual Activity: Not on file   Other Topics Concern  . Not on file   Social History Narrative    Review of Systems: See HPI, otherwise negative ROS  Physical Exam: BP 137/78 mmHg  Pulse 88  Temp(Src) 98.3 F (36.8 C) (Oral)  Ht 5\' 10"  (1.778 m)  Wt 251 lb 9.6 oz (114.125 kg)  BMI 36.10 kg/m2 General:   Alert,   pleasant and cooperative in NAD Eyes:  Sclera clear, no  icterus.   Conjunctiva pink. Neck:  Supple; no masses or thyromegaly. No significant cervical adenopathy. Lungs:  Clear throughout to auscultation.   No wheezes, crackles, or rhonchi. No acute distress. Heart:  Regular rate and rhythm; no murmurs, clicks, rubs,  or gallops. Abdomen: Non-distended, normal bowel sounds.  Soft and nontender ;  liver edge is palpable 3 fingerbreadths below the right costal margin. Margin is smooth without tenderness. I do not appreciate a spleen or other abnormality.   Pulses:  Normal pulses noted. Extremities:  Without clubbing or edema.  Wearing a brace right ankle.  Impression:   Pleasant 59 year old gentleman with Lynch syndrome. History of colonic adenoma. He is actually overdue for surveillance colonoscopy. GERD symptoms well controlled on omeprazole 20 mg twice daily. Hepatomegaly in the setting of a fairly heavy regular alcohol use in the way of beer.  GERD symptoms well controlled on twice a day omeprazole. History of hepatomegaly. Previously recommended ultrasound not done yet. LFTs normal.  Recommendations:   He needs a surveillance colonoscopy now. Because of challenges with adequate conscious sedation previously, will enlist the help of Dr. [facility] for deep sedation  for this case.The risks, benefits, limitations, alternatives and imponderables have been reviewed with the patient. Questions have been answered. All parties are agreeable.   Continue omeprazole twice daily for GERD. Limited alcohol consumption recommended  Hepatic ultrasound in the near future. Further recommendations to follow.     Notice: This dictation was prepared with Dragon dictation along with smaller phrase technology. Any transcriptional errors that result from this process are unintentional and may not be corrected upon review.

## 2015-09-05 NOTE — Anesthesia Preprocedure Evaluation (Signed)
Anesthesia Evaluation  Patient identified by MRN, date of birth, ID band Patient awake    Reviewed: Allergy & Precautions, NPO status   Airway Mallampati: II       Dental  (+) Teeth Intact,    Pulmonary former smoker,    Pulmonary exam normal        Cardiovascular Normal cardiovascular exam     Neuro/Psych Anxiety    GI/Hepatic GERD  Medicated and Controlled,(+)     substance abuse  alcohol use,   Endo/Other    Renal/GU      Musculoskeletal  (+) Arthritis , Osteoarthritis,    Abdominal Normal abdominal exam  (+)   Peds  Hematology   Anesthesia Other Findings   Reproductive/Obstetrics                             Anesthesia Physical Anesthesia Plan  ASA: III  Anesthesia Plan: MAC   Post-op Pain Management:    Induction: Intravenous  Airway Management Planned: Nasal Cannula  Additional Equipment:   Intra-op Plan:   Post-operative Plan:   Informed Consent: I have reviewed the patients History and Physical, chart, labs and discussed the procedure including the risks, benefits and alternatives for the proposed anesthesia with the patient or authorized representative who has indicated his/her understanding and acceptance.     Plan Discussed with: CRNA  Anesthesia Plan Comments:         Anesthesia Quick Evaluation

## 2015-09-05 NOTE — Anesthesia Postprocedure Evaluation (Signed)
Anesthesia Post Note  Patient: Craig Scott  Procedure(s) Performed: Procedure(s) (LRB): COLONOSCOPY WITH PROPOFOL (N/A) POLYPECTOMY  Patient location during evaluation: PACU Anesthesia Type: MAC Level of consciousness: awake and alert Pain management: pain level controlled Vital Signs Assessment: post-procedure vital signs reviewed and stable Respiratory status: spontaneous breathing Cardiovascular status: blood pressure returned to baseline Anesthetic complications: no    Last Vitals:  Filed Vitals:   09/05/15 0922 09/05/15 1137  BP: 132/89 127/76  Pulse: 77   Temp: 36.3 C 36.7 C  Resp: 17     Last Pain: There were no vitals filed for this visit.               Drucie Opitz

## 2015-09-05 NOTE — Progress Notes (Signed)
Patient just needs a hepatitis C antibody assay done

## 2015-09-05 NOTE — Anesthesia Procedure Notes (Signed)
Procedure Name: MAC Date/Time: 09/05/2015 10:55 AM Performed by: Vista Deck Pre-anesthesia Checklist: Patient identified, Emergency Drugs available, Suction available, Timeout performed and Patient being monitored Patient Re-evaluated:Patient Re-evaluated prior to inductionOxygen Delivery Method: Non-rebreather mask

## 2015-09-07 ENCOUNTER — Encounter: Payer: Self-pay | Admitting: Internal Medicine

## 2015-09-11 ENCOUNTER — Encounter (HOSPITAL_COMMUNITY): Payer: Self-pay | Admitting: Internal Medicine

## 2015-09-19 ENCOUNTER — Ambulatory Visit (HOSPITAL_COMMUNITY): Payer: BC Managed Care – PPO | Attending: Orthopedic Surgery | Admitting: Physical Therapy

## 2015-09-19 DIAGNOSIS — Z7409 Other reduced mobility: Secondary | ICD-10-CM | POA: Diagnosis present

## 2015-09-19 DIAGNOSIS — M6281 Muscle weakness (generalized): Secondary | ICD-10-CM

## 2015-09-19 DIAGNOSIS — R531 Weakness: Secondary | ICD-10-CM | POA: Diagnosis present

## 2015-09-19 DIAGNOSIS — M25571 Pain in right ankle and joints of right foot: Secondary | ICD-10-CM | POA: Diagnosis not present

## 2015-09-19 DIAGNOSIS — R262 Difficulty in walking, not elsewhere classified: Secondary | ICD-10-CM | POA: Insufficient documentation

## 2015-09-19 DIAGNOSIS — M25671 Stiffness of right ankle, not elsewhere classified: Secondary | ICD-10-CM | POA: Insufficient documentation

## 2015-09-19 NOTE — Patient Instructions (Signed)
   Ankle Pump  Sit at edge of chair, bed or plynth table. Pull foot back toward you, as you feel calf muscle stretch out; then point your toes as far as you can. Hold each repetition for 1-2 seconds. Relax and repeat 10-15, 2-3 times per day.     Ankle circles  Rotate ankle in a clockwise and counter clockwise direction. Repeat 10 times each direction, 2-3 times per day.     ANKLE ABC's   While in a seated position, write out the alphabet in the air with your big toe.  Your ankle should be moving as you perform this, not your knee or hip.  Perform 1-2 times, twice a day.     Seated Towel Scrunches with foot.  Sit with a towel on a smooth surface. Place your foot on the towel. Using your toes, scrunch the towel up. Unfold fold the towel and repeat the process.  Repeat 2-3 times, twice a day.

## 2015-09-19 NOTE — Therapy (Signed)
[facility] [place], [place], [address] Phone: [phone]   Fax:  [phone]  Physical Therapy Evaluation  Patient Details  Name: Craig Scott MRN: [id number] Date of Birth: 07-06-1956 Referring Provider: Wylene Simmer MD   Encounter Date: 09/19/2015      PT End of Session - 09/19/15 1216    Visit Number 1   Number of Visits 12   Date for PT Re-Evaluation 10/17/15   Authorization Type [employer]    Authorization Time Period 09/19/15 to 11/17/15   PT Start Time 0800   PT Stop Time 0846   PT Time Calculation (min) 46 min   Activity Tolerance Patient tolerated treatment well   Behavior During Therapy [facility] for tasks assessed/performed      Past Medical History  Diagnosis Date  . High cholesterol   . GERD (gastroesophageal reflux disease)   . Anxiety   . Neuropathy ([place])     both feet  . Arthritis     ra  . Lynch syndrome     has gene that causes cancer  . Tubular adenoma     Past Surgical History  Procedure Laterality Date  . Knee surgery Right     bilat   . Colonoscopy   12/09/01    RMR: Normal normal rectum/ A few scattered left-sided diverticula.  The remainder of the colonic  mucosa appeared normal  . Colonoscopy  12/15/2002    RMR: Normal rectum, scattered left-sided diverticula.  The remainder of the colonic mucosa appeared normal  . Colonoscopy  04/10/2004    RMR: Normal rectum/Sigmoid diverticula/The remainder of the colonic mucosa appeared normal  . Esophagogastroduodenoscopy  12/23/2005    RMR:   Esophagogastric peptic stricture with reflux esophagitis, status post  dilation as described above/ Small hiatal hernia, otherwise normal stomach.  Bulbar erosions otherwise normal D1 and D2  . Colonoscopy  12/23/2005    RMR: Normal rectum, scattered sigmoid diverticula Colonic mucosa appeared normal  . Colonoscopy    01/20/2008    RMR: Distal diminutive rectal polyps, status post cold biopsy removal  otherwise,  normal rectum/ biopsy removal; scattered sigmoid diverticula; and benign-appearing/ulcers about the ileocecal valve, status post biopsy.  Remainder of colonic mucosa and terminal mucosa appeared normal.  . Colonoscopy  05/22/2010    RMR: distal diminutive rectal polyp s/p bx otherwise normal/few scattered pancolonic diverticula  . Nose surgery      cartiledge removal  . Colonoscopy  09/16/2012    Dr.Rourk- normal rectum, few scattered pancolonic diverticulosis, one diminutive polyp in the base of the cecum. 3x3 area of hyper pigmentation i the mid descending segment, this was a flat benign-appearing area o/w the remainder of the colonic mucosa appeared normal. bx= tubular adenoma and benign colonic mucosa  . Eye surgery  as child  . Tympanostomy tube placement Right     early 2014  . Joint replacement  09/30/2010    Right knee  . Total knee arthroplasty Left 10/23/2013    Procedure: LEFT TOTAL KNEE ARTHROPLASTY;  Surgeon: [name], MD;  Location: [facility];  Service: Orthopedics;  Laterality: Left;  . Total hip arthroplasty Left 03/20/2015    Procedure: LEFT TOTAL HIP ARTHROPLASTY ANTERIOR APPROACH;  Surgeon: Gaynelle Arabian, MD;  Location: [facility];  Service: Orthopedics;  Laterality: Left;  . Bunionectomy with hammertoe reconstruction and gastroc slide Right 06/27/2015    Procedure: RIGHT GASTROC RECESSION/POSTERIOR TIBIAL TENOLYSIS/SECOND AND THIRD METATARSAL WEIL OSTEOTOMY AND HAMMERTOE CORRECTION;  Surgeon:  Wylene Simmer, MD;  Location: [place];  Service: Orthopedics;  Laterality: Right;  . Calcaneal osteotomy Right 06/27/2015    Procedure: CALCANEAL OSTEOTOMY;  Surgeon: Wylene Simmer, MD;  Location: [place];  Service: Orthopedics;  Laterality: Right;  . Colonoscopy with propofol N/A 09/05/2015    Procedure: COLONOSCOPY WITH PROPOFOL;  Surgeon: Daneil Dolin, MD;  [facility];  Service: Endoscopy;  Laterality: N/A;  0830  . Polypectomy  09/05/2015     Procedure: POLYPECTOMY;  Surgeon: Daneil Dolin, MD;  [facility];  Service: Endoscopy;;    There were no vitals filed for this visit.  Visit Diagnosis:  Pain in joint, ankle and foot, right - Plan: PT plan of care cert/re-cert  Difficulty walking due to ankle and foot joint - Plan: PT plan of care cert/re-cert  Ankle stiffness, right - Plan: PT plan of care cert/re-cert  Muscle weakness - Plan: PT plan of care cert/re-cert      Subjective Assessment - 09/19/15 0805    Subjective Has not had a lot of pain since his surgery; reports that he is still having hip pain, although it had gotten better since surgery. Altered sensation R foot/toes.    Pertinent History Patient had extensive surgery on R foot including toes, heel, and arch; did not have therapy after this surgery. Has been working for the past three weeks with boot on; MD took him out of boot almost a week ago. Had a R hip replacement in July but cannot remember his approach/precautions.    How long can you stand comfortably? 1/5- immediate discomfort    How long can you walk comfortably? 1/5- combination of hip and foot pain limits ambulation    Patient Stated Goals be able to wear regular shoes, walk normally    Currently in Pain? Yes   Pain Score 2   low on pain scale, just a bit right now             [facility] PT Assessment - 09/19/15 0001    Assessment   Medical Diagnosis Orthopedic aftercare/posterior tibial tendonitis R    Referring Provider Wylene Simmer MD    Onset Date/Surgical Date 06/27/15   Next MD Visit Early February with Dr. Doran Durand    Precautions   Precautions None   Precaution Comments total hip replacement last july; extensive surgery R foot    Restrictions   Weight Bearing Restrictions No   Balance Screen   Has the patient fallen in the past 6 months Yes   How many times? 1- had been drinking and stepped on board in yard when wearing boot    Has the patient had a decrease in activity level  because of a fear of falling?  No   Is the patient reluctant to leave their home because of a fear of falling?  No   Observation/Other Assessments   Observations R foot remains relatively flat footed with inversion of calcaeous at rest and very limited mobility inversion/eversion   Posture/Postural Control   Posture Comments varus knees, hip ER, reduced weight bearing R LE, flexed at hips    AROM   Right Ankle Dorsiflexion 5   Right Ankle Plantar Flexion --  wfl    Right Ankle Inversion 4   Right Ankle Eversion 14   Left Ankle Dorsiflexion 5   Left Ankle Plantar Flexion --  wfl   Left Ankle Inversion 24   Left Ankle Eversion 20  Strength   Right Knee Flexion 4/5   Right Knee Extension 5/5   Left Knee Flexion 5/5   Left Knee Extension 5/5   Right Ankle Dorsiflexion 4+/5   Right Ankle Plantar Flexion 2/5   Right Ankle Inversion 4-/5  range limited    Right Ankle Eversion 4-/5  range limited    Left Ankle Dorsiflexion 5/5   Left Ankle Plantar Flexion 4-/5   Left Ankle Inversion 5/5   Left Ankle Eversion 5/5   Palpation   Palpation comment no tenderness noted around achilles, arch, toes of foot; fair flexibilty of operative toes    Ambulation/Gait   Gait Comments reduced gait speed, flexed at hips, reduced R ankle motion throughout gait with reduced pushoff, reduced gait speed, reduced stance time R/step length L, slight limp on corners                            PT Education - 09/19/15 1216    Education provided Yes   Education Details prognosis, plan of care, HEP    Person(s) Educated Patient   Methods Explanation;Handout   Comprehension Verbalized understanding;Returned demonstration          PT Short Term Goals - 09/19/15 1222    PT SHORT TERM GOAL #1   Title Patient will improve R ankle ROM by 7-10 degrees on all planes in order to improve range and overall gait mechancis    Time 3   Period Weeks   Status New   PT SHORT TERM GOAL #2    Title Patient will be able to verballly describe the importance of proper, supportive footwear that is sized correctly, as well as how to size it correctly,  in order to reduce pain and improve overall mechanics during mobilty    Time 3   Period Weeks   Status New   PT SHORT TERM GOAL #3   Title Patient will be independent in correctly and consistently performing appropraite HEP, to be updated PRN    Time 3   Period Weeks   Status New           PT Long Term Goals - 09/19/15 1225    PT LONG TERM GOAL #1   Title Patietn will demonstrate significant improvements in gait, including equal step/stride lengths, reduced base of support, in order to enhance mobility and promote overall efficiency of gait    Time 6   Period Weeks   Status New   PT LONG TERM GOAL #2   Title Patient will be able to wear regular shoes on a regular basis secondary to improved ankle stabilty and strength, with pain no more than 2/10 and efficient gait mechanics in order to improve comfort of overall mobilty    Time 6   Period Weeks   Status New   PT LONG TERM GOAL #3   Title Patient will demonstate 5/5 strength in R ankle in order to enhance overall stability and improve efficiency of mobltiy    Time 6   Period Weeks   Status New               Plan - 09/19/15 1218    Clinical Impression Statement Patient presents status post extensive surgery to R foot and ankle today, reports that he has been at work for the past three weeks with no major problems with pain however reports that it feels like his foot is really expanding throughout the day.  Upon examination patient demonstrates reduced muscle strength and severe ankle stiffness as well as significant gait deviations, however he does appear to be doing much better in general as compared to before surgery. At this time patient will benefit from skilled PT services in order to address functional limitations and assist in optimizing level of function.    Pt  will benefit from skilled therapeutic intervention in order to improve on the following deficits Abnormal gait;Decreased endurance;Improper body mechanics;Decreased strength;Impaired flexibility;Postural dysfunction;Decreased mobility;Difficulty walking;Decreased range of motion;Pain;Decreased coordination   Rehab Potential Good   Clinical Impairments Affecting Rehab Potential hip pain    PT Frequency 2x / week   PT Duration 6 weeks   PT Treatment/Interventions ADLs/Self Care Home Management;Gait training;Neuromuscular re-education;Ultrasound;Stair training;Functional mobility training;Patient/family education;Passive range of motion;Cryotherapy;Therapeutic activities;Electrical Stimulation;Therapeutic exercise;Manual techniques;DME Instruction;Balance training   PT Next Visit Plan review HEP and goals; functional ankle mobility and functional stretching, gait training    PT Home Exercise Plan given    Consulted and Agree with Plan of Care Patient         Problem List Patient Active Problem List   Diagnosis Date Noted  . History of colonic polyps   . Flatfoot 06/27/2015  . OA (osteoarthritis) of hip 03/20/2015  . Hyponatremia 10/24/2013  . OA (osteoarthritis) of knee 10/23/2013  . Hepatomegaly 09/01/2012  . Lynch syndrome 09/01/2012  . GERD (gastroesophageal reflux disease) 09/01/2012  . Knee pain 08/12/2011    Deniece Ree PT, DPT [phone]  [place] [address] [place], [place], [address] Phone: [phone]   Fax:  [phone]  Name: Craig Scott MRN: [id number] Date of Birth: 1955/09/29

## 2015-09-26 ENCOUNTER — Ambulatory Visit (HOSPITAL_COMMUNITY): Payer: BC Managed Care – PPO | Admitting: Physical Therapy

## 2015-09-26 DIAGNOSIS — M6281 Muscle weakness (generalized): Secondary | ICD-10-CM

## 2015-09-26 DIAGNOSIS — M25571 Pain in right ankle and joints of right foot: Secondary | ICD-10-CM

## 2015-09-26 DIAGNOSIS — Z7409 Other reduced mobility: Secondary | ICD-10-CM

## 2015-09-26 DIAGNOSIS — R531 Weakness: Secondary | ICD-10-CM

## 2015-09-26 DIAGNOSIS — M25671 Stiffness of right ankle, not elsewhere classified: Secondary | ICD-10-CM

## 2015-09-26 DIAGNOSIS — R262 Difficulty in walking, not elsewhere classified: Secondary | ICD-10-CM

## 2015-09-26 NOTE — Therapy (Signed)
[place], [place], [address] Phone: [phone]   Fax:  [phone]  Physical Therapy Treatment  Patient Details  Name: Craig Scott MRN: [id number] Date of Birth: 1956/08/21 Referring Provider: Wylene Simmer MD   Encounter Date: 09/26/2015      PT End of Session - 09/26/15 1044    Visit Number 2   Number of Visits 12   Date for PT Re-Evaluation 10/17/15   Authorization Type BC[employer]    Authorization Time Period 09/19/15 to 11/17/15   PT Start Time 0800   PT Stop Time 0845   PT Time Calculation (min) 45 min   Activity Tolerance Patient tolerated treatment well   Behavior During Therapy [facility] for tasks assessed/performed      Past Medical History  Diagnosis Date  . High cholesterol   . GERD (gastroesophageal reflux disease)   . Anxiety   . Neuropathy ([place])     both feet  . Arthritis     ra  . Lynch syndrome     has gene that causes cancer  . Tubular adenoma     Past Surgical History  Procedure Laterality Date  . Knee surgery Right     bilat   . Colonoscopy   12/09/01    RMR: Normal normal rectum/ A few scattered left-sided diverticula.  The remainder of the colonic  mucosa appeared normal  . Colonoscopy  12/15/2002    RMR: Normal rectum, scattered left-sided diverticula.  The remainder of the colonic mucosa appeared normal  . Colonoscopy  04/10/2004    RMR: Normal rectum/Sigmoid diverticula/The remainder of the colonic mucosa appeared normal  . Esophagogastroduodenoscopy  12/23/2005    RMR:   Esophagogastric peptic stricture with reflux esophagitis, status post  dilation as described above/ Small hiatal hernia, otherwise normal stomach.  Bulbar erosions otherwise normal D1 and D2  . Colonoscopy  12/23/2005    RMR: Normal rectum, scattered sigmoid diverticula Colonic mucosa appeared normal  . Colonoscopy    01/20/2008    RMR: Distal diminutive rectal polyps, status post cold biopsy removal  otherwise,  normal rectum/ biopsy removal; scattered sigmoid diverticula; and benign-appearing/ulcers about the ileocecal valve, status post biopsy.  Remainder of colonic mucosa and terminal mucosa appeared normal.  . Colonoscopy  05/22/2010    RMR: distal diminutive rectal polyp s/p bx otherwise normal/few scattered pancolonic diverticula  . Nose surgery      cartiledge removal  . Colonoscopy  09/16/2012    Dr.Rourk- normal rectum, few scattered pancolonic diverticulosis, one diminutive polyp in the base of the cecum. 3x3 area of hyper pigmentation i the mid descending segment, this was a flat benign-appearing area o/w the remainder of the colonic mucosa appeared normal. bx= tubular adenoma and benign colonic mucosa  . Eye surgery  as child  . Tympanostomy tube placement Right     early 2014  . Joint replacement  09/30/2010    Right knee  . Total knee arthroplasty Left 10/23/2013    Procedure: LEFT TOTAL KNEE ARTHROPLASTY;  Surgeon: [name], MD;  Location: [facility];  Service: Orthopedics;  Laterality: Left;  . Total hip arthroplasty Left 03/20/2015    Procedure: LEFT TOTAL HIP ARTHROPLASTY ANTERIOR APPROACH;  Surgeon: Gaynelle Arabian, MD;  Location: [facility];  Service: Orthopedics;  Laterality: Left;  . Bunionectomy with hammertoe reconstruction and gastroc slide Right 06/27/2015    Procedure: RIGHT GASTROC RECESSION/POSTERIOR TIBIAL TENOLYSIS/SECOND AND THIRD METATARSAL WEIL OSTEOTOMY AND HAMMERTOE CORRECTION;  Surgeon:  Wylene Simmer, MD;  Location: [place];  Service: Orthopedics;  Laterality: Right;  . Calcaneal osteotomy Right 06/27/2015    Procedure: CALCANEAL OSTEOTOMY;  Surgeon: Wylene Simmer, MD;  Location: [place];  Service: Orthopedics;  Laterality: Right;  . Colonoscopy with propofol N/A 09/05/2015    Procedure: COLONOSCOPY WITH PROPOFOL;  Surgeon: Daneil Dolin, MD;  [facility];  Service: Endoscopy;  Laterality: N/A;  0830  . Polypectomy  09/05/2015     Procedure: POLYPECTOMY;  Surgeon: Daneil Dolin, MD;  [facility];  Service: Endoscopy;;    There were no vitals filed for this visit.  Visit Diagnosis:  Pain in joint, ankle and foot, right  Difficulty walking due to ankle and foot joint  Ankle stiffness, right  Muscle weakness  Weakness generalized  Decreased mobility and endurance      Subjective Assessment - 09/26/15 1039    Subjective PT comes today with antalgia, wearing work boots with insoles.  Pt states his foot always hurts some.  Reports compliance with HEP.   Currently in Pain? Yes   Pain Score 2    Pain Location Foot   Pain Orientation Right            [facility] PT Assessment - 09/26/15 1043    Assessment   Medical Diagnosis Orthopedic aftercare/posterior tibial tendonitis R    AROM   Right Ankle Dorsiflexion 10   Right Ankle Inversion 5   Right Ankle Eversion 5                     [facility] Adult PT Treatment/Exercise - 09/26/15 0814    Manual Therapy   Manual Therapy Soft tissue mobilization;Joint mobilization   Manual therapy comments scar massage and joint mobility for ankle and toes   Ankle Exercises: Stretches   Slant Board Stretch 3 reps;30 seconds   Other Stretch hamstring stretch 3X30"   Ankle Exercises: Seated   ABC's 1 rep   Ankle Circles/Pumps 10 reps   Towel Crunch Limitations   Towel Crunch Weights (lbs) 0   Towel Crunch Limitations 2 minutes   Towel Inversion/Eversion 5 reps                PT Education - 09/26/15 1049    Education provided Yes   Education Details HEP review and goals/measuremetns from initial evaluation   Person(s) Educated Patient   Methods Explanation;Handout;Demonstration   Comprehension Verbalized understanding;Returned demonstration          PT Short Term Goals - 09/19/15 1222    PT SHORT TERM GOAL #1   Title Patient will improve R ankle ROM by 7-10 degrees on all planes in order to improve range and overall gait mechancis     Time 3   Period Weeks   Status New   PT SHORT TERM GOAL #2   Title Patient will be able to verballly describe the importance of proper, supportive footwear that is sized correctly, as well as how to size it correctly,  in order to reduce pain and improve overall mechanics during mobilty    Time 3   Period Weeks   Status New   PT SHORT TERM GOAL #3   Title Patient will be independent in correctly and consistently performing appropraite HEP, to be updated PRN    Time 3   Period Weeks   Status New           PT Long Term Goals -  09/19/15 1225    PT LONG TERM GOAL #1   Title Patietn will demonstrate significant improvements in gait, including equal step/stride lengths, reduced base of support, in order to enhance mobility and promote overall efficiency of gait    Time 6   Period Weeks   Status New   PT LONG TERM GOAL #2   Title Patient will be able to wear regular shoes on a regular basis secondary to improved ankle stabilty and strength, with pain no more than 2/10 and efficient gait mechanics in order to improve comfort of overall mobilty    Time 6   Period Weeks   Status New   PT LONG TERM GOAL #3   Title Patient will demonstate 5/5 strength in R ankle in order to enhance overall stability and improve efficiency of mobltiy    Time 6   Period Weeks   Status New               Plan - 09/26/15 1044    Clinical Impression Statement Progressed with standing gastroc/hamstring stretches today and began towel inversion/eversion.  ROM remeasured today with overall improvement in dorsiflexion to 10 degrees.  Unable to achieve greater than 5 degress each way with Inversion/eversion when knee/LE mobiliized to decrease substituion.  Reviewed HEP and evaluation and giiven copy today.  Scar tissue massage/soft tissue and joint mobility completed to ankle and toes.     Pt will benefit from skilled therapeutic intervention in order to improve on the following deficits Abnormal  gait;Decreased endurance;Improper body mechanics;Decreased strength;Impaired flexibility;Postural dysfunction;Decreased mobility;Difficulty walking;Decreased range of motion;Pain;Decreased coordination   Rehab Potential Good   Clinical Impairments Affecting Rehab Potential hip pain    PT Frequency 2x / week   PT Duration 6 weeks   PT Treatment/Interventions ADLs/Self Care Home Management;Gait training;Neuromuscular re-education;Ultrasound;Stair training;Functional mobility training;Patient/family education;Passive range of motion;Cryotherapy;Therapeutic activities;Electrical Stimulation;Therapeutic exercise;Manual techniques;DME Instruction;Balance training   PT Next Visit Plan Continue to progress towards goals.  Add rockerboard, heel/toe raises and seated BAPS next session.     PT Home Exercise Plan given    Consulted and Agree with Plan of Care Patient        Problem List Patient Active Problem List   Diagnosis Date Noted  . History of colonic polyps   . Flatfoot 06/27/2015  . OA (osteoarthritis) of hip 03/20/2015  . Hyponatremia 10/24/2013  . OA (osteoarthritis) of knee 10/23/2013  . Hepatomegaly 09/01/2012  . Lynch syndrome 09/01/2012  . GERD (gastroesophageal reflux disease) 09/01/2012  . Knee pain 08/12/2011    Teena Irani, PTA/CLT [phone]  09/26/2015, 10:49 AM  [place] [address] [place], [place], [address] Phone: [phone]   Fax:  [phone]  Name: WHIT TONES MRN: [id number] Date of Birth: [birth year]

## 2015-10-01 ENCOUNTER — Ambulatory Visit (HOSPITAL_COMMUNITY): Payer: BC Managed Care – PPO | Admitting: Physical Therapy

## 2015-10-01 DIAGNOSIS — M25671 Stiffness of right ankle, not elsewhere classified: Secondary | ICD-10-CM

## 2015-10-01 DIAGNOSIS — M6281 Muscle weakness (generalized): Secondary | ICD-10-CM

## 2015-10-01 DIAGNOSIS — Z7409 Other reduced mobility: Secondary | ICD-10-CM

## 2015-10-01 DIAGNOSIS — R262 Difficulty in walking, not elsewhere classified: Secondary | ICD-10-CM

## 2015-10-01 DIAGNOSIS — M25571 Pain in right ankle and joints of right foot: Secondary | ICD-10-CM

## 2015-10-01 DIAGNOSIS — R531 Weakness: Secondary | ICD-10-CM

## 2015-10-01 NOTE — Therapy (Signed)
[place], [place], [address] Phone: [phone]   Fax:  [phone]  Physical Therapy Treatment  Patient Details  Name: Craig [place] MRN: [id number] Date of Birth: November 25, 1955 Referring Provider: Wylene Simmer MD   Encounter Date: 10/01/2015      PT End of Session - 10/01/15 0851    Visit Number 3   Number of Visits 12   Date for PT Re-Evaluation 10/17/15   Authorization Type BCBS    Authorization Time Period 09/19/15 to 11/17/15   PT Start Time 0801   PT Stop Time 0844   PT Time Calculation (min) 43 min   Activity Tolerance Patient tolerated treatment well   Behavior During Therapy [facility] for tasks assessed/performed      Past Medical History  Diagnosis Date  . High cholesterol   . GERD (gastroesophageal reflux disease)   . Anxiety   . Neuropathy ([place])     both feet  . Arthritis     ra  . Lynch syndrome     has gene that causes cancer  . Tubular adenoma     Past Surgical History  Procedure Laterality Date  . Knee surgery Right     bilat   . Colonoscopy   12/09/01    RMR: Normal normal rectum/ A few scattered left-sided diverticula.  The remainder of the colonic  mucosa appeared normal  . Colonoscopy  12/15/2002    RMR: Normal rectum, scattered left-sided diverticula.  The remainder of the colonic mucosa appeared normal  . Colonoscopy  04/10/2004    RMR: Normal rectum/Sigmoid diverticula/The remainder of the colonic mucosa appeared normal  . Esophagogastroduodenoscopy  12/23/2005    RMR:   Esophagogastric peptic stricture with reflux esophagitis, status post  dilation as described above/ Small hiatal hernia, otherwise normal stomach.  Bulbar erosions otherwise normal D1 and D2  . Colonoscopy  12/23/2005    RMR: Normal rectum, scattered sigmoid diverticula Colonic mucosa appeared normal  . Colonoscopy    01/20/2008    RMR: Distal diminutive rectal polyps, status post cold biopsy removal  otherwise,  normal rectum/ biopsy removal; scattered sigmoid diverticula; and benign-appearing/ulcers about the ileocecal valve, status post biopsy.  Remainder of colonic mucosa and terminal mucosa appeared normal.  . Colonoscopy  05/22/2010    RMR: distal diminutive rectal polyp s/p bx otherwise normal/few scattered pancolonic diverticula  . Nose surgery      cartiledge removal  . Colonoscopy  09/16/2012    Dr.Rourk- normal rectum, few scattered pancolonic diverticulosis, one diminutive polyp in the base of the cecum. 3x3 area of hyper pigmentation i the mid descending segment, this was a flat benign-appearing area o/w the remainder of the colonic mucosa appeared normal. bx= tubular adenoma and benign colonic mucosa  . Eye surgery  as child  . Tympanostomy tube placement Right     early 2014  . Joint replacement  09/30/2010    Right knee  . Total knee arthroplasty Left 10/23/2013    Procedure: LEFT TOTAL KNEE ARTHROPLASTY;  Surgeon: [name], MD;  [facility];  Service: Orthopedics;  Laterality: Left;  . Total hip arthroplasty Left 03/20/2015    Procedure: LEFT TOTAL HIP ARTHROPLASTY ANTERIOR APPROACH;  Surgeon: Gaynelle Arabian, MD;  [facility];  Service: Orthopedics;  Laterality: Left;  . Bunionectomy with hammertoe reconstruction and gastroc slide Right 06/27/2015    Procedure: RIGHT GASTROC RECESSION/POSTERIOR TIBIAL TENOLYSIS/SECOND AND THIRD METATARSAL WEIL OSTEOTOMY AND HAMMERTOE CORRECTION;  Surgeon:  Wylene Simmer, MD;  Location: [place];  Service: Orthopedics;  Laterality: Right;  . Calcaneal osteotomy Right 06/27/2015    Procedure: CALCANEAL OSTEOTOMY;  Surgeon: Wylene Simmer, MD;  Location: [place];  Service: Orthopedics;  Laterality: Right;  . Colonoscopy with propofol N/A 09/05/2015    Procedure: COLONOSCOPY WITH PROPOFOL;  Surgeon: Daneil Dolin, MD;  [facility];  Service: Endoscopy;  Laterality: N/A;  0830  . Polypectomy  09/05/2015     Procedure: POLYPECTOMY;  Surgeon: Daneil Dolin, MD;  [facility];  Service: Endoscopy;;    There were no vitals filed for this visit.  Visit Diagnosis:  Pain in joint, ankle and foot, right  Difficulty walking due to ankle and foot joint  Ankle stiffness, right  Muscle weakness  Weakness generalized  Decreased mobility and endurance      Subjective Assessment - 10/01/15 0802    Subjective Patient reports that he is doing well today, still having pain but he states that his hip is his most painful new part    Pertinent History Patient had extensive surgery on R foot including toes, heel, and arch; did not have therapy after this surgery. Has been working for the past three weeks with boot on; MD took him out of boot almost a week ago. Had a R hip replacement in July but cannot remember his approach/precautions.    Currently in Pain? Yes   Pain Score 4    Pain Location Foot  and hip                          [facility] Adult PT Treatment/Exercise - 10/01/15 0001    Manual Therapy   Manual Therapy Soft tissue mobilization   Manual therapy comments performed separately from all other skilled intervetntions    Soft tissue mobilization scar massage    Ankle Exercises: Stretches   Gastroc Stretch 3 reps;30 seconds   Slant Board Stretch 3 reps;30 seconds   Other Stretch hamstring stretch 3X30"   Ankle Exercises: Seated   ABC's 3 reps   Ankle Circles/Pumps 15 reps   BAPS Sitting;Level 1   Other Seated Ankle Exercises inversion/eversion 1x10 with 3 second holds    Other Seated Ankle Exercises pen coin 1x10, 2 second holds    Ankle Exercises: Standing   Rocker Board Other (comment);3 minutes  AP and lateral    Other Standing Ankle Exercises gait x27ft with cues for heel-toe pattern                 PT Education - 10/01/15 0850    Education provided Yes   Education Details education regarding need for support footwear that provides support to  ankle/foot; encouraged to do pen-coin exercise at home and perform passive inversion/eversion at home within pain limitations; role of inversion/eversion in maintaining balance on unstable surfaces    Person(s) Educated Patient   Methods Explanation   Comprehension Verbalized understanding          PT Short Term Goals - 09/19/15 1222    PT SHORT TERM GOAL #1   Title Patient will improve R ankle ROM by 7-10 degrees on all planes in order to improve range and overall gait mechancis    Time 3   Period Weeks   Status New   PT SHORT TERM GOAL #2   Title Patient will be able to verballly describe the importance of proper, supportive footwear that  is sized correctly, as well as how to size it correctly,  in order to reduce pain and improve overall mechanics during mobilty    Time 3   Period Weeks   Status New   PT SHORT TERM GOAL #3   Title Patient will be independent in correctly and consistently performing appropraite HEP, to be updated PRN    Time 3   Period Weeks   Status New           PT Long Term Goals - 09/19/15 1225    PT LONG TERM GOAL #1   Title Patietn will demonstrate significant improvements in gait, including equal step/stride lengths, reduced base of support, in order to enhance mobility and promote overall efficiency of gait    Time 6   Period Weeks   Status New   PT LONG TERM GOAL #2   Title Patient will be able to wear regular shoes on a regular basis secondary to improved ankle stabilty and strength, with pain no more than 2/10 and efficient gait mechanics in order to improve comfort of overall mobilty    Time 6   Period Weeks   Status New   PT LONG TERM GOAL #3   Title Patient will demonstate 5/5 strength in R ankle in order to enhance overall stability and improve efficiency of mobltiy    Time 6   Period Weeks   Status New               Plan - 10/01/15 [id number]    Clinical Impression Statement Continued functional exercises and stretches today for  ankle mobility in general with on-going ROM difficulties especially with inversion/eversion by patient due to ankle stiffness. Patietn does report that he has had quite a bit of difficulty with walking over uneven surfaces and around his property in general due to ankle stiffness. Introduced seated BAPS and standing rocking board today with some difficulty and Mod assist for  correct from on BaPS. Finished session with gait training.    Pt will benefit from skilled therapeutic intervention in order to improve on the following deficits Abnormal gait;Decreased endurance;Improper body mechanics;Decreased strength;Impaired flexibility;Postural dysfunction;Decreased mobility;Difficulty walking;Decreased range of motion;Pain;Decreased coordination   Rehab Potential Good   Clinical Impairments Affecting Rehab Potential hip pain    PT Frequency 2x / week   PT Duration 6 weeks   PT Treatment/Interventions ADLs/Self Care Home Management;Gait training;Neuromuscular re-education;Ultrasound;Stair training;Functional mobility training;Patient/family education;Passive range of motion;Cryotherapy;Therapeutic activities;Electrical Stimulation;Therapeutic exercise;Manual techniques;DME Instruction;Balance training   PT Next Visit Plan Continue to progress towards goals.  Add rockerboard, heel/toe raises and seated BAPS next session.  Continue manual to foot/ankle.    PT Home Exercise Plan given    Consulted and Agree with Plan of Care Patient        Problem List Patient Active Problem List   Diagnosis Date Noted  . History of colonic polyps   . Flatfoot 06/27/2015  . OA (osteoarthritis) of hip 03/20/2015  . Hyponatremia 10/24/2013  . OA (osteoarthritis) of knee 10/23/2013  . Hepatomegaly 09/01/2012  . Lynch syndrome 09/01/2012  . GERD (gastroesophageal reflux disease) 09/01/2012  . Knee pain 08/12/2011    Deniece Ree PT, DPT [phone]  [place] [address] [place], [place], [address] Phone: [phone]   Fax:  [phone]  Name: CALUM STLOUIS MRN: [id number] Date of Birth: 03-Mar-1956

## 2015-10-03 ENCOUNTER — Ambulatory Visit (HOSPITAL_COMMUNITY): Payer: BC Managed Care – PPO | Admitting: Physical Therapy

## 2015-10-08 ENCOUNTER — Ambulatory Visit (HOSPITAL_COMMUNITY): Payer: BC Managed Care – PPO | Admitting: Physical Therapy

## 2015-10-08 DIAGNOSIS — M25671 Stiffness of right ankle, not elsewhere classified: Secondary | ICD-10-CM

## 2015-10-08 DIAGNOSIS — M25571 Pain in right ankle and joints of right foot: Secondary | ICD-10-CM | POA: Diagnosis not present

## 2015-10-08 DIAGNOSIS — R531 Weakness: Secondary | ICD-10-CM

## 2015-10-08 DIAGNOSIS — M6281 Muscle weakness (generalized): Secondary | ICD-10-CM

## 2015-10-08 DIAGNOSIS — R262 Difficulty in walking, not elsewhere classified: Secondary | ICD-10-CM

## 2015-10-08 DIAGNOSIS — Z7409 Other reduced mobility: Secondary | ICD-10-CM

## 2015-10-08 NOTE — Therapy (Signed)
[place], [place], [address] Phone: [phone]   Fax:  [phone]  Physical Therapy Treatment  Patient Details  Name: Craig Scott MRN: [id number] Date of Birth: 04-05-1956 Referring Provider: Wylene Simmer MD   Encounter Date: 10/08/2015      PT End of Session - 10/08/15 0851    Visit Number 4   Number of Visits 12   Date for PT Re-Evaluation 10/17/15   Authorization Type BC[employer]    Authorization Time Period 09/19/15 to 11/17/15   PT Start Time 0801   PT Stop Time 0842   PT Time Calculation (min) 41 min   Activity Tolerance Patient tolerated treatment well   Behavior During Therapy [facility] for tasks assessed/performed      Past Medical History  Diagnosis Date  . High cholesterol   . GERD (gastroesophageal reflux disease)   . Anxiety   . Neuropathy ([place])     both feet  . Arthritis     ra  . Lynch syndrome     has gene that causes cancer  . Tubular adenoma     Past Surgical History  Procedure Laterality Date  . Knee surgery Right     bilat   . Colonoscopy   12/09/01    RMR: Normal normal rectum/ A few scattered left-sided diverticula.  The remainder of the colonic  mucosa appeared normal  . Colonoscopy  12/15/2002    RMR: Normal rectum, scattered left-sided diverticula.  The remainder of the colonic mucosa appeared normal  . Colonoscopy  04/10/2004    RMR: Normal rectum/Sigmoid diverticula/The remainder of the colonic mucosa appeared normal  . Esophagogastroduodenoscopy  12/23/2005    RMR:   Esophagogastric peptic stricture with reflux esophagitis, status post  dilation as described above/ Small hiatal hernia, otherwise normal stomach.  Bulbar erosions otherwise normal D1 and D2  . Colonoscopy  12/23/2005    RMR: Normal rectum, scattered sigmoid diverticula Colonic mucosa appeared normal  . Colonoscopy    01/20/2008    RMR: Distal diminutive rectal polyps, status post cold biopsy removal  otherwise,  normal rectum/ biopsy removal; scattered sigmoid diverticula; and benign-appearing/ulcers about the ileocecal valve, status post biopsy.  Remainder of colonic mucosa and terminal mucosa appeared normal.  . Colonoscopy  05/22/2010    RMR: distal diminutive rectal polyp s/p bx otherwise normal/few scattered pancolonic diverticula  . Nose surgery      cartiledge removal  . Colonoscopy  09/16/2012    Dr.Rourk- normal rectum, few scattered pancolonic diverticulosis, one diminutive polyp in the base of the cecum. 3x3 area of hyper pigmentation i the mid descending segment, this was a flat benign-appearing area o/w the remainder of the colonic mucosa appeared normal. bx= tubular adenoma and benign colonic mucosa  . Eye surgery  as child  . Tympanostomy tube placement Right     early 2014  . Joint replacement  09/30/2010    Right knee  . Total knee arthroplasty Left 10/23/2013    Procedure: LEFT TOTAL KNEE ARTHROPLASTY;  Surgeon: [name], MD;  Location: [facility];  Service: Orthopedics;  Laterality: Left;  . Total hip arthroplasty Left 03/20/2015    Procedure: LEFT TOTAL HIP ARTHROPLASTY ANTERIOR APPROACH;  Surgeon: Gaynelle Arabian, MD;  Location: [facility];  Service: Orthopedics;  Laterality: Left;  . Bunionectomy with hammertoe reconstruction and gastroc slide Right 06/27/2015    Procedure: RIGHT GASTROC RECESSION/POSTERIOR TIBIAL TENOLYSIS/SECOND AND THIRD METATARSAL WEIL OSTEOTOMY AND HAMMERTOE CORRECTION;  Surgeon:  Wylene Simmer, MD;  Location: [place];  Service: Orthopedics;  Laterality: Right;  . Calcaneal osteotomy Right 06/27/2015    Procedure: CALCANEAL OSTEOTOMY;  Surgeon: Wylene Simmer, MD;  Location: [place];  Service: Orthopedics;  Laterality: Right;  . Colonoscopy with propofol N/A 09/05/2015    Procedure: COLONOSCOPY WITH PROPOFOL;  Surgeon: Daneil Dolin, MD;  [facility];  Service: Endoscopy;  Laterality: N/A;  0830  . Polypectomy  09/05/2015     Procedure: POLYPECTOMY;  Surgeon: Daneil Dolin, MD;  [facility];  Service: Endoscopy;;    There were no vitals filed for this visit.  Visit Diagnosis:  Pain in joint, ankle and foot, right  Difficulty walking due to ankle and foot joint  Ankle stiffness, right  Muscle weakness  Weakness generalized  Decreased mobility and endurance      Subjective Assessment - 10/08/15 0803    Subjective Patient reports he is doing well but having some pain due to colder weather; just feeling stiff this morning    Pertinent History Patient had extensive surgery on R foot including toes, heel, and arch; did not have therapy after this surgery. Has been working for the past three weeks with boot on; MD took him out of boot almost a week ago. Had a R hip replacement in July but cannot remember his approach/precautions.    Currently in Pain? Yes   Pain Score 4    Pain Location Foot   Pain Orientation Right                         [facility] Adult PT Treatment/Exercise - 10/08/15 0001    Ambulation/Gait   Gait Comments x237ft with focus heel-toe, push-off    Manual Therapy   Manual Therapy Joint mobilization   Manual therapy comments performed separately from all other skilled intervetntions    Joint Mobilization MTP flexoin/extension, foot supination/pronation, calcaneal inversion/eversion, talar DF   Soft tissue mobilization retromassage foot and ankle    Ankle Exercises: Stretches   Gastroc Stretch 3 reps;30 seconds   Other Stretch hamstring stretch 3X30"; plantar fascia stretch 3x30 seconds    Ankle Exercises: Seated   Ankle Circles/Pumps 15 reps   Other Seated Ankle Exercises inversion/eversion 1x10 with 3 second holds    Ankle Exercises: Standing   BAPS Level 1;Standing;10 reps   Rocker Board 2 minutes;Other (comment)  AP and lateral, B HHA                 PT Education - 10/08/15 0850    Education provided Yes   Education Details encouraged to  perform retro-massage at home    Person(s) Educated Patient   Methods Explanation   Comprehension Verbalized understanding          PT Short Term Goals - 09/19/15 1222    PT SHORT TERM GOAL #1   Title Patient will improve R ankle ROM by 7-10 degrees on all planes in order to improve range and overall gait mechancis    Time 3   Period Weeks   Status New   PT SHORT TERM GOAL #2   Title Patient will be able to verballly describe the importance of proper, supportive footwear that is sized correctly, as well as how to size it correctly,  in order to reduce pain and improve overall mechanics during mobilty    Time 3   Period Weeks   Status New  PT SHORT TERM GOAL #3   Title Patient will be independent in correctly and consistently performing appropraite HEP, to be updated PRN    Time 3   Period Weeks   Status New           PT Long Term Goals - 09/19/15 1225    PT LONG TERM GOAL #1   Title Patietn will demonstrate significant improvements in gait, including equal step/stride lengths, reduced base of support, in order to enhance mobility and promote overall efficiency of gait    Time 6   Period Weeks   Status New   PT LONG TERM GOAL #2   Title Patient will be able to wear regular shoes on a regular basis secondary to improved ankle stabilty and strength, with pain no more than 2/10 and efficient gait mechanics in order to improve comfort of overall mobilty    Time 6   Period Weeks   Status New   PT LONG TERM GOAL #3   Title Patient will demonstate 5/5 strength in R ankle in order to enhance overall stability and improve efficiency of mobltiy    Time 6   Period Weeks   Status New               Plan - 10/08/15 0834    Clinical Impression Statement Introduced plantar flexor stretch and manual to R foot/ankle today with good tolerance by patient, who reported immediate improvement in pain and ffeelings of mobility in foot after joint mobilizations. Otherwise continued  with functional exercises today with focus on mobility and on four planes of ankle motion. Attempted BAPS board today with fair performance and Min(A) from PT. Finished session with focus on Magazine features editor with definite improvement in heel-toe pattern after joint mobilizations today. Encouraged patient perform retromassage to foot/ankle at home.     Pt will benefit from skilled therapeutic intervention in order to improve on the following deficits Abnormal gait;Decreased endurance;Improper body mechanics;Decreased strength;Impaired flexibility;Postural dysfunction;Decreased mobility;Difficulty walking;Decreased range of motion;Pain;Decreased coordination   Rehab Potential Good   Clinical Impairments Affecting Rehab Potential hip pain    PT Frequency 2x / week   PT Duration 6 weeks   PT Treatment/Interventions ADLs/Self Care Home Management;Gait training;Neuromuscular re-education;Ultrasound;Stair training;Functional mobility training;Patient/family education;Passive range of motion;Cryotherapy;Therapeutic activities;Electrical Stimulation;Therapeutic exercise;Manual techniques;DME Instruction;Balance training   PT Next Visit Plan Continue to progress towards goals.  Add rockerboard, heel/toe raises and seated BAPS next session.  Continue manual to foot/ankle.    PT Home Exercise Plan given    Consulted and Agree with Plan of Care Patient        Problem List Patient Active Problem List   Diagnosis Date Noted  . History of colonic polyps   . Flatfoot 06/27/2015  . OA (osteoarthritis) of hip 03/20/2015  . Hyponatremia 10/24/2013  . OA (osteoarthritis) of knee 10/23/2013  . Hepatomegaly 09/01/2012  . Lynch syndrome 09/01/2012  . GERD (gastroesophageal reflux disease) 09/01/2012  . Knee pain 08/12/2011    Deniece Ree PT, DPT [phone]  [place] [address] [place], [place], [address] Phone: [phone]   Fax:  [phone]  Name:  Craig Scott MRN: [id number] Date of Birth: August 09, 1956

## 2015-10-10 ENCOUNTER — Ambulatory Visit (HOSPITAL_COMMUNITY): Payer: BC Managed Care – PPO | Admitting: Physical Therapy

## 2015-10-10 DIAGNOSIS — M6281 Muscle weakness (generalized): Secondary | ICD-10-CM

## 2015-10-10 DIAGNOSIS — M25571 Pain in right ankle and joints of right foot: Secondary | ICD-10-CM

## 2015-10-10 DIAGNOSIS — Z7409 Other reduced mobility: Secondary | ICD-10-CM

## 2015-10-10 DIAGNOSIS — R531 Weakness: Secondary | ICD-10-CM

## 2015-10-10 DIAGNOSIS — M25671 Stiffness of right ankle, not elsewhere classified: Secondary | ICD-10-CM

## 2015-10-10 DIAGNOSIS — R262 Difficulty in walking, not elsewhere classified: Secondary | ICD-10-CM

## 2015-10-10 NOTE — Patient Instructions (Signed)
Plantar Fascia Stretch    Standing with only ball of left foot on stair, push heel down until stretch is felt through arch of foot. Hold _30___ seconds. Relax. Repeat _2___ times per set. Do ___1_ sets per session. Do __2__ sessions per day.  http://orth.exer.us/22   Copyright  VHI. All rights reserved.  Heel Raise: Bilateral (Standing)    Rise on balls of feet. Repeat _10__ times per set. Do ___1_ sets per session. Do ___2_ sessions per day.  http://orth.exer.us/38   Copyright  VHI. All rights reserved.  Toe Raise (Standing)    Rock back on heels. Repeat __10__ times per set. Do ___1_ sets per session. Do 2____ sessions per day.  http://orth.exer.us/42   Copyright  VHI. All rights reserved.

## 2015-10-10 NOTE — Therapy (Signed)
[place], [place], [address] Phone: [phone]   Fax:  [phone]  Physical Therapy Treatment  Patient Details  Name: Craig Scott MRN: [id number] Date of Birth: 1955-10-07 Referring Provider: Wylene Simmer MD   Encounter Date: 10/10/2015      PT End of Session - 10/10/15 0824    Visit Number 5   Number of Visits 12   Date for PT Re-Evaluation 10/17/15   Authorization Type BCBS    Authorization Time Period 09/19/15 to 11/17/15   PT Start Time 0802   PT Stop Time 0844   PT Time Calculation (min) 42 min   Activity Tolerance Patient tolerated treatment well   Behavior During Therapy --      Past Medical History  Diagnosis Date  . High cholesterol   . GERD (gastroesophageal reflux disease)   . Anxiety   . Neuropathy ([place])     both feet  . Arthritis     ra  . Lynch syndrome     has gene that causes cancer  . Tubular adenoma     Past Surgical History  Procedure Laterality Date  . Knee surgery Right     bilat   . Colonoscopy   12/09/01    RMR: Normal normal rectum/ A few scattered left-sided diverticula.  The remainder of the colonic  mucosa appeared normal  . Colonoscopy  12/15/2002    RMR: Normal rectum, scattered left-sided diverticula.  The remainder of the colonic mucosa appeared normal  . Colonoscopy  04/10/2004    RMR: Normal rectum/Sigmoid diverticula/The remainder of the colonic mucosa appeared normal  . Esophagogastroduodenoscopy  12/23/2005    RMR:   Esophagogastric peptic stricture with reflux esophagitis, status post  dilation as described above/ Small hiatal hernia, otherwise normal stomach.  Bulbar erosions otherwise normal D1 and D2  . Colonoscopy  12/23/2005    RMR: Normal rectum, scattered sigmoid diverticula Colonic mucosa appeared normal  . Colonoscopy    01/20/2008    RMR: Distal diminutive rectal polyps, status post cold biopsy removal  otherwise, normal rectum/ biopsy removal;  scattered sigmoid diverticula; and benign-appearing/ulcers about the ileocecal valve, status post biopsy.  Remainder of colonic mucosa and terminal mucosa appeared normal.  . Colonoscopy  05/22/2010    RMR: distal diminutive rectal polyp s/p bx otherwise normal/few scattered pancolonic diverticula  . Nose surgery      cartiledge removal  . Colonoscopy  09/16/2012    Dr.Rourk- normal rectum, few scattered pancolonic diverticulosis, one diminutive polyp in the base of the cecum. 3x3 area of hyper pigmentation i the mid descending segment, this was a flat benign-appearing area o/w the remainder of the colonic mucosa appeared normal. bx= tubular adenoma and benign colonic mucosa  . Eye surgery  as child  . Tympanostomy tube placement Right     early 2014  . Joint replacement  09/30/2010    Right knee  . Total knee arthroplasty Left 10/23/2013    Procedure: LEFT TOTAL KNEE ARTHROPLASTY;  Surgeon: [name], MD;  [facility];  Service: Orthopedics;  Laterality: Left;  . Total hip arthroplasty Left 03/20/2015    Procedure: LEFT TOTAL HIP ARTHROPLASTY ANTERIOR APPROACH;  Surgeon: Gaynelle Arabian, MD;  [facility];  Service: Orthopedics;  Laterality: Left;  . Bunionectomy with hammertoe reconstruction and gastroc slide Right 06/27/2015    Procedure: RIGHT GASTROC RECESSION/POSTERIOR TIBIAL TENOLYSIS/SECOND AND THIRD METATARSAL WEIL OSTEOTOMY AND HAMMERTOE CORRECTION;  Surgeon: Wylene Simmer, MD;  Location: [place];  Service: Orthopedics;  Laterality: Right;  . Calcaneal osteotomy Right 06/27/2015    Procedure: CALCANEAL OSTEOTOMY;  Surgeon: Wylene Simmer, MD;  Location: [place];  Service: Orthopedics;  Laterality: Right;  . Colonoscopy with propofol N/A 09/05/2015    Procedure: COLONOSCOPY WITH PROPOFOL;  Surgeon: Daneil Dolin, MD;  [facility];  Service: Endoscopy;  Laterality: N/A;  0830  . Polypectomy  09/05/2015    Procedure: POLYPECTOMY;   Surgeon: Daneil Dolin, MD;  [facility];  Service: Endoscopy;;    There were no vitals filed for this visit.  Visit Diagnosis:  Pain in joint, ankle and foot, right  Difficulty walking due to ankle and foot joint  Ankle stiffness, right  Muscle weakness  Weakness generalized  Decreased mobility and endurance      Subjective Assessment - 10/10/15 0801    Subjective Pt states that he has gotten use to being in pain.  He does his stretches throughout the day.   Currently in Pain? Yes   Pain Score 5    Pain Location Foot   Pain Orientation Right   Pain Descriptors / Indicators Aching   Pain Onset More than a month ago   Pain Frequency Constant   Aggravating Factors  activity    Pain Relieving Factors elevation                          [facility] Adult PT Treatment/Exercise - 10/10/15 0001    Ambulation/Gait   Gait Comments x232ft with focus heel-toe, push-off    Manual Therapy   Manual Therapy Joint mobilization   Manual therapy comments performed separately from all other skilled intervetntions    Joint Mobilization MTP flexoin/extension, foot supination/pronation, calcaneal inversion/eversion, talar DF   Soft tissue mobilization retromassage foot and ankle    Ankle Exercises: Stretches   Plantar Fascia Stretch 2 reps;30 seconds   Slant Board Stretch 2 reps;30 seconds   Ankle Exercises: Standing   BAPS Standing;Level 2   SLS x4    Rocker Board 2 minutes   Heel Raises 10 reps   Toe Raise 10 reps   Ankle Exercises: Machines for Strengthening   Cybex Leg Press 3 PL x 10                 PT Education - 10/10/15 0844    Education provided Yes   Education Details for new exercises    Person(s) Educated Patient   Methods Explanation;Demonstration;Handout   Comprehension Verbalized understanding;Returned demonstration          PT Short Term Goals - 09/19/15 1222    PT SHORT TERM GOAL #1   Title Patient will improve R ankle ROM  by 7-10 degrees on all planes in order to improve range and overall gait mechancis    Time 3   Period Weeks   Status New   PT SHORT TERM GOAL #2   Title Patient will be able to verballly describe the importance of proper, supportive footwear that is sized correctly, as well as how to size it correctly,  in order to reduce pain and improve overall mechanics during mobilty    Time 3   Period Weeks   Status New   PT SHORT TERM GOAL #3   Title Patient will be independent in correctly and consistently performing appropraite HEP, to be updated PRN    Time 3   Period Weeks  Status New           PT Long Term Goals - 09/19/15 1225    PT LONG TERM GOAL #1   Title Patietn will demonstrate significant improvements in gait, including equal step/stride lengths, reduced base of support, in order to enhance mobility and promote overall efficiency of gait    Time 6   Period Weeks   Status New   PT LONG TERM GOAL #2   Title Patient will be able to wear regular shoes on a regular basis secondary to improved ankle stabilty and strength, with pain no more than 2/10 and efficient gait mechanics in order to improve comfort of overall mobilty    Time 6   Period Weeks   Status New   PT LONG TERM GOAL #3   Title Patient will demonstate 5/5 strength in R ankle in order to enhance overall stability and improve efficiency of mobltiy    Time 6   Period Weeks   Status New               Plan - 10/10/15 [id number]    Clinical Impression Statement Pt continues to need gait training as he ambulates in a flexed position with knees bent and hips ER.  Pt is able to ambulate with a functional gati when verbally cued.  Added heel/toe raises to program as well as cybex foot press.  Pt needs constant verbal and tactile cuing in order to complete exercises in correct form.     Pt will benefit from skilled therapeutic intervention in order to improve on the following deficits Abnormal gait;Decreased  endurance;Improper body mechanics;Decreased strength;Impaired flexibility;Postural dysfunction;Decreased mobility;Difficulty walking;Decreased range of motion;Pain;Decreased coordination   Rehab Potential Good   PT Next Visit Plan Continue with manual and gait traiining. Add towel toe curls    Consulted and Agree with Plan of Care Patient        Problem List Patient Active Problem List   Diagnosis Date Noted  . History of colonic polyps   . Flatfoot 06/27/2015  . OA (osteoarthritis) of hip 03/20/2015  . Hyponatremia 10/24/2013  . OA (osteoarthritis) of knee 10/23/2013  . Hepatomegaly 09/01/2012  . Lynch syndrome 09/01/2012  . GERD (gastroesophageal reflux disease) 09/01/2012  . Knee pain 08/12/2011   Rayetta Humphrey, PT CLT [phone] 10/10/2015, 8:45 AM  [facility], [place], [address] Phone: [phone]   Fax:  [phone]  Name: Craig Scott MRN: [id number] Date of Birth: Feb 12, 1956

## 2015-10-15 ENCOUNTER — Ambulatory Visit (HOSPITAL_COMMUNITY): Payer: BC Managed Care – PPO | Admitting: Physical Therapy

## 2015-10-15 DIAGNOSIS — M25571 Pain in right ankle and joints of right foot: Secondary | ICD-10-CM

## 2015-10-15 DIAGNOSIS — Z7409 Other reduced mobility: Secondary | ICD-10-CM

## 2015-10-15 DIAGNOSIS — M6281 Muscle weakness (generalized): Secondary | ICD-10-CM

## 2015-10-15 DIAGNOSIS — R531 Weakness: Secondary | ICD-10-CM

## 2015-10-15 DIAGNOSIS — R262 Difficulty in walking, not elsewhere classified: Secondary | ICD-10-CM

## 2015-10-15 DIAGNOSIS — M25671 Stiffness of right ankle, not elsewhere classified: Secondary | ICD-10-CM

## 2015-10-15 NOTE — Therapy (Signed)
[place], [place], [address] Phone: [phone]   Fax:  [phone]  Physical Therapy Treatment  Patient Details  Name: Craig Scott MRN: [id number] Date of Birth: 1956-02-08 Referring Provider: Wylene Simmer MD   Encounter Date: 10/15/2015      PT End of Session - 10/15/15 0842    Visit Number 6   Number of Visits 12   Date for PT Re-Evaluation 10/17/15   Authorization Type BCBS    Authorization Time Period 09/19/15 to 11/17/15   PT Start Time 0802   PT Stop Time 0843   PT Time Calculation (min) 41 min   Activity Tolerance Patient tolerated treatment well   Behavior During Therapy [facility] for tasks assessed/performed      Past Medical History  Diagnosis Date  . High cholesterol   . GERD (gastroesophageal reflux disease)   . Anxiety   . Neuropathy ([place])     both feet  . Arthritis     ra  . Lynch syndrome     has gene that causes cancer  . Tubular adenoma     Past Surgical History  Procedure Laterality Date  . Knee surgery Right     bilat   . Colonoscopy   12/09/01    RMR: Normal normal rectum/ A few scattered left-sided diverticula.  The remainder of the colonic  mucosa appeared normal  . Colonoscopy  12/15/2002    RMR: Normal rectum, scattered left-sided diverticula.  The remainder of the colonic mucosa appeared normal  . Colonoscopy  04/10/2004    RMR: Normal rectum/Sigmoid diverticula/The remainder of the colonic mucosa appeared normal  . Esophagogastroduodenoscopy  12/23/2005    RMR:   Esophagogastric peptic stricture with reflux esophagitis, status post  dilation as described above/ Small hiatal hernia, otherwise normal stomach.  Bulbar erosions otherwise normal D1 and D2  . Colonoscopy  12/23/2005    RMR: Normal rectum, scattered sigmoid diverticula Colonic mucosa appeared normal  . Colonoscopy    01/20/2008    RMR: Distal diminutive rectal polyps, status post cold biopsy removal  otherwise,  normal rectum/ biopsy removal; scattered sigmoid diverticula; and benign-appearing/ulcers about the ileocecal valve, status post biopsy.  Remainder of colonic mucosa and terminal mucosa appeared normal.  . Colonoscopy  05/22/2010    RMR: distal diminutive rectal polyp s/p bx otherwise normal/few scattered pancolonic diverticula  . Nose surgery      cartiledge removal  . Colonoscopy  09/16/2012    Dr.Rourk- normal rectum, few scattered pancolonic diverticulosis, one diminutive polyp in the base of the cecum. 3x3 area of hyper pigmentation i the mid descending segment, this was a flat benign-appearing area o/w the remainder of the colonic mucosa appeared normal. bx= tubular adenoma and benign colonic mucosa  . Eye surgery  as child  . Tympanostomy tube placement Right     early 2014  . Joint replacement  09/30/2010    Right knee  . Total knee arthroplasty Left 10/23/2013    Procedure: LEFT TOTAL KNEE ARTHROPLASTY;  Surgeon: [name], MD;  Location: [facility];  Service: Orthopedics;  Laterality: Left;  . Total hip arthroplasty Left 03/20/2015    Procedure: LEFT TOTAL HIP ARTHROPLASTY ANTERIOR APPROACH;  Surgeon: Gaynelle Arabian, MD;  Location: [facility];  Service: Orthopedics;  Laterality: Left;  . Bunionectomy with hammertoe reconstruction and gastroc slide Right 06/27/2015    Procedure: RIGHT GASTROC RECESSION/POSTERIOR TIBIAL TENOLYSIS/SECOND AND THIRD METATARSAL WEIL OSTEOTOMY AND HAMMERTOE CORRECTION;  Surgeon:  Wylene Simmer, MD;  Location: [place];  Service: Orthopedics;  Laterality: Right;  . Calcaneal osteotomy Right 06/27/2015    Procedure: CALCANEAL OSTEOTOMY;  Surgeon: Wylene Simmer, MD;  Location: [place];  Service: Orthopedics;  Laterality: Right;  . Colonoscopy with propofol N/A 09/05/2015    Procedure: COLONOSCOPY WITH PROPOFOL;  Surgeon: Daneil Dolin, MD;  [facility];  Service: Endoscopy;  Laterality: N/A;  0830  . Polypectomy  09/05/2015     Procedure: POLYPECTOMY;  Surgeon: Daneil Dolin, MD;  [facility];  Service: Endoscopy;;    There were no vitals filed for this visit.  Visit Diagnosis:  Pain in joint, ankle and foot, right  Difficulty walking due to ankle and foot joint  Ankle stiffness, right  Muscle weakness  Weakness generalized  Decreased mobility and endurance      Subjective Assessment - 10/15/15 0804    Subjective Patient reports he is doing well today, brought a different pair of shoes and reports that his swelling seems like it is going down in the R foot    Pertinent History Patient had extensive surgery on R foot including toes, heel, and arch; did not have therapy after this surgery. Has been working for the past three weeks with boot on; MD took him out of boot almost a week ago. Had a R hip replacement in July but cannot remember his approach/precautions.    Currently in Pain? Yes   Pain Score 5    Pain Location Foot   Pain Orientation Right                         [facility] Adult PT Treatment/Exercise - 10/15/15 0001    Ambulation/Gait   Gait Comments x265ft, 259ft with cues for proper gait form and sequence    Manual Therapy   Manual Therapy Joint mobilization   Manual therapy comments performed separately from all other skilled intervetntions    Joint Mobilization MTP flexoin/extension, foot supination/pronation, calcaneal inversion/eversion, talar DF   Soft tissue mobilization retromassage foot and ankle    Ankle Exercises: Stretches   Plantar Fascia Stretch 3 reps;30 seconds   Slant Board Stretch 3 reps;30 seconds   Ankle Exercises: Standing   SLS x4 each side    Rocker Board 2 minutes   Heel Raises 10 reps   Toe Raise 10 reps                PT Education - 10/15/15 0841    Education provided Yes   Education Details keep wearing work boots for [employer], gradually try being in regular shoes more at home per pain limits/tolerance    Person(s) Educated  Patient   Methods Explanation   Comprehension Verbalized understanding          PT Short Term Goals - 09/19/15 1222    PT SHORT TERM GOAL #1   Title Patient will improve R ankle ROM by 7-10 degrees on all planes in order to improve range and overall gait mechancis    Time 3   Period Weeks   Status New   PT SHORT TERM GOAL #2   Title Patient will be able to verballly describe the importance of proper, supportive footwear that is sized correctly, as well as how to size it correctly,  in order to reduce pain and improve overall mechanics during mobilty    Time 3   Period Weeks   Status  New   PT SHORT TERM GOAL #3   Title Patient will be independent in correctly and consistently performing appropraite HEP, to be updated PRN    Time 3   Period Weeks   Status New           PT Long Term Goals - 09/19/15 1225    PT LONG TERM GOAL #1   Title Patietn will demonstrate significant improvements in gait, including equal step/stride lengths, reduced base of support, in order to enhance mobility and promote overall efficiency of gait    Time 6   Period Weeks   Status New   PT LONG TERM GOAL #2   Title Patient will be able to wear regular shoes on a regular basis secondary to improved ankle stabilty and strength, with pain no more than 2/10 and efficient gait mechanics in order to improve comfort of overall mobilty    Time 6   Period Weeks   Status New   PT LONG TERM GOAL #3   Title Patient will demonstate 5/5 strength in R ankle in order to enhance overall stability and improve efficiency of mobltiy    Time 6   Period Weeks   Status New               Plan - 10/15/15 0824    Clinical Impression Statement Patient brought less restrictve shoes today with less ankle support; put these shoes on for all standing and gait based exericses today. Continued to focus on functional stretching, manual to foot/ankle, and functional ankle exercise with focus on mobiility and gait training  today. Patient did require cues for good form and posture througout session but did not have increased pain today.    Pt will benefit from skilled therapeutic intervention in order to improve on the following deficits Abnormal gait;Decreased endurance;Improper body mechanics;Decreased strength;Impaired flexibility;Postural dysfunction;Decreased mobility;Difficulty walking;Decreased range of motion;Pain;Decreased coordination   Rehab Potential Good   Clinical Impairments Affecting Rehab Potential hip pain    PT Frequency 2x / week   PT Duration 6 weeks   PT Treatment/Interventions ADLs/Self Care Home Management;Gait training;Neuromuscular re-education;Ultrasound;Stair training;Functional mobility training;Patient/family education;Passive range of motion;Cryotherapy;Therapeutic activities;Electrical Stimulation;Therapeutic exercise;Manual techniques;DME Instruction;Balance training   PT Next Visit Plan Re-assess    PT Home Exercise Plan given    Consulted and Agree with Plan of Care Patient        Problem List Patient Active Problem List   Diagnosis Date Noted  . History of colonic polyps   . Flatfoot 06/27/2015  . OA (osteoarthritis) of hip 03/20/2015  . Hyponatremia 10/24/2013  . OA (osteoarthritis) of knee 10/23/2013  . Hepatomegaly 09/01/2012  . Lynch syndrome 09/01/2012  . GERD (gastroesophageal reflux disease) 09/01/2012  . Knee pain 08/12/2011    Deniece Ree PT, DPT [phone]  [place] [address] [place], [place], [address] Phone: [phone]   Fax:  [phone]  Name: Craig Scott MRN: [id number] Date of Birth: [birth year]

## 2015-10-17 ENCOUNTER — Ambulatory Visit (HOSPITAL_COMMUNITY): Payer: BC Managed Care – PPO | Attending: Orthopedic Surgery | Admitting: Physical Therapy

## 2015-10-17 DIAGNOSIS — R262 Difficulty in walking, not elsewhere classified: Secondary | ICD-10-CM | POA: Diagnosis present

## 2015-10-17 DIAGNOSIS — R531 Weakness: Secondary | ICD-10-CM | POA: Insufficient documentation

## 2015-10-17 DIAGNOSIS — M25671 Stiffness of right ankle, not elsewhere classified: Secondary | ICD-10-CM

## 2015-10-17 DIAGNOSIS — M6281 Muscle weakness (generalized): Secondary | ICD-10-CM | POA: Insufficient documentation

## 2015-10-17 DIAGNOSIS — M25571 Pain in right ankle and joints of right foot: Secondary | ICD-10-CM | POA: Diagnosis present

## 2015-10-17 DIAGNOSIS — Z7409 Other reduced mobility: Secondary | ICD-10-CM | POA: Diagnosis present

## 2015-10-17 NOTE — Therapy (Signed)
[place], [place], [address] Phone: [phone]   Fax:  [phone]  Physical Therapy Treatment (Re-Assessment)  Patient Details  Name: Craig Scott MRN: [id number] Date of Birth: 02/14/1956 Referring Provider: Wylene Simmer MD   Encounter Date: 10/17/2015      PT End of Session - 10/17/15 0853    Visit Number 7   Number of Visits 15   Date for PT Re-Evaluation 11/14/15   Authorization Type BCBS    Authorization Time Period 09/19/15 to 11/17/15   PT Start Time 0801   PT Stop Time 0848   PT Time Calculation (min) 47 min   Activity Tolerance Patient tolerated treatment well   Behavior During Therapy [facility] for tasks assessed/performed      Past Medical History  Diagnosis Date  . High cholesterol   . GERD (gastroesophageal reflux disease)   . Anxiety   . Neuropathy ([place])     both feet  . Arthritis     ra  . Lynch syndrome     has gene that causes cancer  . Tubular adenoma     Past Surgical History  Procedure Laterality Date  . Knee surgery Right     bilat   . Colonoscopy   12/09/01    RMR: Normal normal rectum/ A few scattered left-sided diverticula.  The remainder of the colonic  mucosa appeared normal  . Colonoscopy  12/15/2002    RMR: Normal rectum, scattered left-sided diverticula.  The remainder of the colonic mucosa appeared normal  . Colonoscopy  04/10/2004    RMR: Normal rectum/Sigmoid diverticula/The remainder of the colonic mucosa appeared normal  . Esophagogastroduodenoscopy  12/23/2005    RMR:   Esophagogastric peptic stricture with reflux esophagitis, status post  dilation as described above/ Small hiatal hernia, otherwise normal stomach.  Bulbar erosions otherwise normal D1 and D2  . Colonoscopy  12/23/2005    RMR: Normal rectum, scattered sigmoid diverticula Colonic mucosa appeared normal  . Colonoscopy    01/20/2008    RMR: Distal diminutive rectal polyps, status post cold biopsy  removal  otherwise, normal rectum/ biopsy removal; scattered sigmoid diverticula; and benign-appearing/ulcers about the ileocecal valve, status post biopsy.  Remainder of colonic mucosa and terminal mucosa appeared normal.  . Colonoscopy  05/22/2010    RMR: distal diminutive rectal polyp s/p bx otherwise normal/few scattered pancolonic diverticula  . Nose surgery      cartiledge removal  . Colonoscopy  09/16/2012    Dr.Rourk- normal rectum, few scattered pancolonic diverticulosis, one diminutive polyp in the base of the cecum. 3x3 area of hyper pigmentation i the mid descending segment, this was a flat benign-appearing area o/w the remainder of the colonic mucosa appeared normal. bx= tubular adenoma and benign colonic mucosa  . Eye surgery  as child  . Tympanostomy tube placement Right     early 2014  . Joint replacement  09/30/2010    Right knee  . Total knee arthroplasty Left 10/23/2013    Procedure: LEFT TOTAL KNEE ARTHROPLASTY;  Surgeon: [name], MD;  [facility];  Service: Orthopedics;  Laterality: Left;  . Total hip arthroplasty Left 03/20/2015    Procedure: LEFT TOTAL HIP ARTHROPLASTY ANTERIOR APPROACH;  Surgeon: Gaynelle Arabian, MD;  [facility];  Service: Orthopedics;  Laterality: Left;  . Bunionectomy with hammertoe reconstruction and gastroc slide Right 06/27/2015    Procedure: RIGHT GASTROC RECESSION/POSTERIOR TIBIAL TENOLYSIS/SECOND AND THIRD METATARSAL WEIL OSTEOTOMY AND HAMMERTOE CORRECTION;  Surgeon: Wylene Simmer, MD;  Location: [place];  Service: Orthopedics;  Laterality: Right;  . Calcaneal osteotomy Right 06/27/2015    Procedure: CALCANEAL OSTEOTOMY;  Surgeon: Wylene Simmer, MD;  Location: [place];  Service: Orthopedics;  Laterality: Right;  . Colonoscopy with propofol N/A 09/05/2015    Procedure: COLONOSCOPY WITH PROPOFOL;  Surgeon: Daneil Dolin, MD;  [facility];  Service: Endoscopy;  Laterality: N/A;  0830  .  Polypectomy  09/05/2015    Procedure: POLYPECTOMY;  Surgeon: Daneil Dolin, MD;  [facility];  Service: Endoscopy;;    There were no vitals filed for this visit.  Visit Diagnosis:  Pain in joint, ankle and foot, right  Difficulty walking due to ankle and foot joint  Ankle stiffness, right  Muscle weakness  Weakness generalized  Decreased mobility and endurance      Subjective Assessment - 10/17/15 0803    Subjective Patient reports he feels like he is getting better, reports it is going slower than he'd like but reports that his MD told him it would be a slow process. 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   Pertinent History Patient had extensive surgery on R foot including toes, heel, and arch; did not have therapy after this surgery. Has been working for the past three weeks with boot on; MD took him out of boot almost a week ago. Had a R hip replacement in July but cannot remember his approach/precautions.    How long can you stand comfortably? 2/2- 30-40 minutes max    How long can you walk comfortably? 2/2- immediate discomfort    Patient Stated Goals be able to wear regular shoes, walk normally    Currently in Pain? Yes   Pain Score 6    Pain Location Foot   Pain Orientation Right   Pain Descriptors / Indicators Aching;Throbbing   Pain Onset More than a month ago   Pain Frequency Constant   Aggravating Factors  activity    Pain Relieving Factors elevation             [facility] PT Assessment - 10/17/15 0001    AROM   Right Ankle Dorsiflexion 12   Right Ankle Plantar Flexion --  wfl    Right Ankle Inversion 8  AAROM    Right Ankle Eversion 9   Left Ankle Dorsiflexion 10   Left Ankle Plantar Flexion --  wfl    Left Ankle Inversion 25   Left Ankle Eversion 20   Strength   Right Knee Flexion 4+/5   Right Knee Extension 5/5   Left Knee Flexion 4+/5   Left Knee Extension 5/5    Right Ankle Dorsiflexion 5/5   Right Ankle Plantar Flexion 2/5   Right Ankle Inversion 4-/5  range and pain limited    Right Ankle Eversion 4/5   Left Ankle Dorsiflexion 5/5   Left Ankle Plantar Flexion 4-/5   Left Ankle Inversion 5/5   Palpation   Palpation comment continues to display rigidity in first/second metatarsal, midfoot, calcaneous, and heel and talus                      [facility] Adult PT Treatment/Exercise - 10/17/15 0001    Manual Therapy   Manual Therapy Joint mobilization   Manual therapy comments performed separately from all other skilled intervetntions    Joint Mobilization  MTP flexoin/extension, foot supination/pronation, calcaneal inversion/eversion, talar DF   Ankle Exercises: Standing   Rocker Board 2 minutes;Other (comment)  AP and lateral    Ankle Exercises: Stretches   Plantar Fascia Stretch 3 reps;30 seconds   Slant Board Stretch 3 reps;30 seconds                PT Education - 10/17/15 0835    Education provided Yes   Education Details plan of care moving forward, progress with skilled PT services, continue gradually transitiioning to sneakers at home    Person(s) Educated Patient   Methods Explanation   Comprehension Verbalized understanding          PT Short Term Goals - 10/17/15 4196    PT SHORT TERM GOAL #1   Title Patient will improve R ankle ROM by 7-10 degrees on all planes in order to improve range and overall gait mechancis    Baseline 2/2- improving    Time 3   Period Weeks   Status On-going   PT SHORT TERM GOAL #2   Title Patient will be able to verballly describe the importance of proper, supportive footwear that is sized correctly, as well as how to size it correctly,  in order to reduce pain and improve overall mechanics during mobilty    Baseline 2/2- have discussed and will continue to do so    Time 3   Period Weeks   Status Partially Met   PT SHORT TERM GOAL #3   Title Patient will be independent in  correctly and consistently performing appropraite HEP, to be updated PRN    Baseline 2/2- doing mostly every day    Time 3   Period Weeks   Status Achieved           PT Long Term Goals - 10/17/15 2229    PT LONG TERM GOAL #1   Title Patietn will demonstrate significant improvements in gait, including equal step/stride lengths, reduced base of support, in order to enhance mobility and promote overall efficiency of gait    Baseline 2/2- still working on this    Time 6   Period Weeks   Status On-going   PT LONG TERM GOAL #2   Title Patient will be able to wear regular shoes on a regular basis secondary to improved ankle stabilty and strength, with pain no more than 2/10 and efficient gait mechanics in order to improve comfort of overall mobilty    Baseline 2/2- just introduced regular shoes last session    Time 6   Period Weeks   Status On-going   PT LONG TERM GOAL #3   Title Patient will demonstate 5/5 strength in R ankle in order to enhance overall stability and improve efficiency of mobltiy    Time 6   Period Weeks   Status On-going               Plan - 10/17/15 0830    Clinical Impression Statement Re-assessment performed today. Patient is improving but does continue to demosntrate significant R ankle stiffness, also some weakness with ankle inversion/eversion, gross hypomobilty throughout foot and ankle, and significant gait impairment at thist time. He reports today that he is having more pain and stiffness today due to doing quite a bit of work outside [employer] and just pushing through pain. Patient advised to continue intermittently using sneakers as tolerated at home based on pain/edema limitations, but still to use work boots for his job and when outside.  Continues  to report edema is improving as he is having a consistently easier time getting shoes on. Recommend continuation of skilled PT services in order to address functional limtatiions and assist in reaching  optimal level of function.    Pt will benefit from skilled therapeutic intervention in order to improve on the following deficits Abnormal gait;Decreased endurance;Improper body mechanics;Decreased strength;Impaired flexibility;Postural dysfunction;Decreased mobility;Difficulty walking;Decreased range of motion;Pain;Decreased coordination   Rehab Potential Good   Clinical Impairments Affecting Rehab Potential hip pain    PT Frequency 2x / week   PT Duration 4 weeks   PT Treatment/Interventions ADLs/Self Care Home Management;Gait training;Neuromuscular re-education;Ultrasound;Stair training;Functional mobility training;Patient/family education;Passive range of motion;Cryotherapy;Therapeutic activities;Electrical Stimulation;Therapeutic exercise;Manual techniques;DME Instruction;Balance training   PT Next Visit Plan Manual/joint mobs to foot/ankle; mobility exercises; gait training; continue to use sneakers during sessions ratehr than work [place] given    Consulted and Agree with Plan of Care Patient        Problem List Patient Active Problem List   Diagnosis Date Noted  . History of colonic polyps   . Flatfoot 06/27/2015  . OA (osteoarthritis) of hip 03/20/2015  . Hyponatremia 10/24/2013  . OA (osteoarthritis) of knee 10/23/2013  . Hepatomegaly 09/01/2012  . Lynch syndrome 09/01/2012  . GERD (gastroesophageal reflux disease) 09/01/2012  . Knee pain 08/12/2011   Physical Therapy Progress Note  Dates of Reporting Period: 09/19/15 to 10/17/15  Objective Reports of Subjective Statement: see above   Objective Measurements: see above   Goal Update: see above   Plan: see above   Reason Skilled Services are Required: gross foot/ankle hypomobility, impaired gait, impaired strength, develop advanced HEP, reduced ankle stability, edema     Deniece Ree PT, DPT [place],  [place], [address] Phone: [phone]   Fax:  [phone]  Name: Craig Scott MRN: [id number] Date of Birth: Mar 22, 1956

## 2015-10-22 ENCOUNTER — Ambulatory Visit (HOSPITAL_COMMUNITY): Payer: BC Managed Care – PPO | Admitting: Physical Therapy

## 2015-10-31 ENCOUNTER — Ambulatory Visit (HOSPITAL_COMMUNITY): Payer: BC Managed Care – PPO | Admitting: Physical Therapy

## 2015-10-31 DIAGNOSIS — M25571 Pain in right ankle and joints of right foot: Secondary | ICD-10-CM | POA: Diagnosis not present

## 2015-10-31 DIAGNOSIS — R531 Weakness: Secondary | ICD-10-CM

## 2015-10-31 DIAGNOSIS — Z7409 Other reduced mobility: Secondary | ICD-10-CM

## 2015-10-31 DIAGNOSIS — M25671 Stiffness of right ankle, not elsewhere classified: Secondary | ICD-10-CM

## 2015-10-31 DIAGNOSIS — R262 Difficulty in walking, not elsewhere classified: Secondary | ICD-10-CM

## 2015-10-31 NOTE — Therapy (Signed)
[place], [place], [address] Phone: [phone]   Fax:  [phone]  Physical Therapy Treatment  Patient Details  Name: Craig Scott MRN: [id number] Date of Birth: Jan 18, 1956 Referring Provider: Wylene Simmer MD   Encounter Date: 10/31/2015      PT End of Session - 10/31/15 1731    Visit Number 8   Number of Visits 15   Date for PT Re-Evaluation 11/14/15   Authorization Type BC[employer]    Authorization Time Period 09/19/15 to 11/17/15   PT Start Time 1645   PT Stop Time 1735   PT Time Calculation (min) 50 min   Activity Tolerance Patient limited by pain   Behavior During Therapy [facility] for tasks assessed/performed      Past Medical History  Diagnosis Date  . High cholesterol   . GERD (gastroesophageal reflux disease)   . Anxiety   . Neuropathy ([place])     both feet  . Arthritis     ra  . Lynch syndrome     has gene that causes cancer  . Tubular adenoma     Past Surgical History  Procedure Laterality Date  . Knee surgery Right     bilat   . Colonoscopy   12/09/01    RMR: Normal normal rectum/ A few scattered left-sided diverticula.  The remainder of the colonic  mucosa appeared normal  . Colonoscopy  12/15/2002    RMR: Normal rectum, scattered left-sided diverticula.  The remainder of the colonic mucosa appeared normal  . Colonoscopy  04/10/2004    RMR: Normal rectum/Sigmoid diverticula/The remainder of the colonic mucosa appeared normal  . Esophagogastroduodenoscopy  12/23/2005    RMR:   Esophagogastric peptic stricture with reflux esophagitis, status post  dilation as described above/ Small hiatal hernia, otherwise normal stomach.  Bulbar erosions otherwise normal D1 and D2  . Colonoscopy  12/23/2005    RMR: Normal rectum, scattered sigmoid diverticula Colonic mucosa appeared normal  . Colonoscopy    01/20/2008    RMR: Distal diminutive rectal polyps, status post cold biopsy removal  otherwise, normal  rectum/ biopsy removal; scattered sigmoid diverticula; and benign-appearing/ulcers about the ileocecal valve, status post biopsy.  Remainder of colonic mucosa and terminal mucosa appeared normal.  . Colonoscopy  05/22/2010    RMR: distal diminutive rectal polyp s/p bx otherwise normal/few scattered pancolonic diverticula  . Nose surgery      cartiledge removal  . Colonoscopy  09/16/2012    Dr.Rourk- normal rectum, few scattered pancolonic diverticulosis, one diminutive polyp in the base of the cecum. 3x3 area of hyper pigmentation i the mid descending segment, this was a flat benign-appearing area o/w the remainder of the colonic mucosa appeared normal. bx= tubular adenoma and benign colonic mucosa  . Eye surgery  as child  . Tympanostomy tube placement Right     early 2014  . Joint replacement  09/30/2010    Right knee  . Total knee arthroplasty Left 10/23/2013    Procedure: LEFT TOTAL KNEE ARTHROPLASTY;  Surgeon: [name], MD;  [facility]: [facility];  Service: Orthopedics;  Laterality: Left;  . Total hip arthroplasty Left 03/20/2015    Procedure: LEFT TOTAL HIP ARTHROPLASTY ANTERIOR APPROACH;  Surgeon: Gaynelle Arabian, MD;  Location: [facility];  Service: Orthopedics;  Laterality: Left;  . Bunionectomy with hammertoe reconstruction and gastroc slide Right 06/27/2015    Procedure: RIGHT GASTROC RECESSION/POSTERIOR TIBIAL TENOLYSIS/SECOND AND THIRD METATARSAL WEIL OSTEOTOMY AND HAMMERTOE CORRECTION;  Surgeon:  Wylene Simmer, MD;  Location: [place];  Service: Orthopedics;  Laterality: Right;  . Calcaneal osteotomy Right 06/27/2015    Procedure: CALCANEAL OSTEOTOMY;  Surgeon: Wylene Simmer, MD;  Location: [place];  Service: Orthopedics;  Laterality: Right;  . Colonoscopy with propofol N/A 09/05/2015    Procedure: COLONOSCOPY WITH PROPOFOL;  Surgeon: Daneil Dolin, MD;  [facility];  Service: Endoscopy;  Laterality: N/A;  0830  . Polypectomy  09/05/2015     Procedure: POLYPECTOMY;  Surgeon: Daneil Dolin, MD;  [facility];  Service: Endoscopy;;    There were no vitals filed for this visit.  Visit Diagnosis:  Pain in joint, ankle and foot, right  Difficulty walking due to ankle and foot joint  Ankle stiffness, right  Weakness generalized  Decreased mobility and endurance      Subjective Assessment - 10/31/15 1647    Subjective Pt comes today stating he continues to work 5 days a week.  States he called in yesterday due to increased pain.  Comes today with increased antalgia and reports of 8/10 pain. i   Currently in Pain? Yes   Pain Score 8    Pain Location Foot   Pain Orientation Right   Pain Descriptors / Indicators Aching;Shooting;Stabbing   Aggravating Factors  actvity and weight bearing                         [facility] Adult PT Treatment/Exercise - 10/31/15 1651    Manual Therapy   Manual Therapy Joint mobilization   Manual therapy comments performed separately from all other skilled intervetntions    Joint Mobilization MTP flexion/extension, foot supination/pronation, calcaneal inversion/eversion, talar DF   Ankle Exercises: Standing   BAPS Standing;Level 2;10 reps   Rocker Board 2 minutes;Other (comment)   Heel Raises 10 reps   Toe Raise 10 reps   Ankle Exercises: Stretches   Plantar Fascia Stretch 3 reps;30 seconds   Slant Board Stretch 3 reps;30 seconds                  PT Short Term Goals - 10/17/15 4492    PT SHORT TERM GOAL #1   Title Patient will improve R ankle ROM by 7-10 degrees on all planes in order to improve range and overall gait mechancis    Baseline 2/2- improving    Time 3   Period Weeks   Status On-going   PT SHORT TERM GOAL #2   Title Patient will be able to verballly describe the importance of proper, supportive footwear that is sized correctly, as well as how to size it correctly,  in order to reduce pain and improve overall mechanics during mobilty     Baseline 2/2- have discussed and will continue to do so    Time 3   Period Weeks   Status Partially Met   PT SHORT TERM GOAL #3   Title Patient will be independent in correctly and consistently performing appropraite HEP, to be updated PRN    Baseline 2/2- doing mostly every day    Time 3   Period Weeks   Status Achieved           PT Long Term Goals - 10/17/15 0100    PT LONG TERM GOAL #1   Title Patietn will demonstrate significant improvements in gait, including equal step/stride lengths, reduced base of support, in order to enhance mobility and promote overall efficiency of gait  Baseline 2/2- still working on this    Time 6   Period Weeks   Status On-going   PT LONG TERM GOAL #2   Title Patient will be able to wear regular shoes on a regular basis secondary to improved ankle stabilty and strength, with pain no more than 2/10 and efficient gait mechanics in order to improve comfort of overall mobilty    Baseline 2/2- just introduced regular shoes last session    Time 6   Period Weeks   Status On-going   PT LONG TERM GOAL #3   Title Patient will demonstate 5/5 strength in R ankle in order to enhance overall stability and improve efficiency of mobltiy    Time 6   Period Weeks   Status On-going               Plan - 10/31/15 1742    Clinical Impression Statement Focus today on improving mobility in foot and ankle.  Resumed therex and added BAPS.  Pt with difficulty keeping BAPS in neutral positioning as tendency to externally rotate Rt LE.  PT admitted to not completing his therex regularly at home.  Reminded importance of completeing therex, expecially stretches for optimal benefits.  Manual completed to foot, ankle and toes to improve mobiltiy which continues to be limited.  Pt c/o sharp sudden pain on lateral plantar aspect of foot during manual and increased pain there after when leaving clinic.  Unsure what caused sudden increase in pain.  PT did work full work day  and is wearing work Photographer.  Pt is also wearing knee high compression stockings.    Pt will benefit from skilled therapeutic intervention in order to improve on the following deficits Abnormal gait;Decreased endurance;Improper body mechanics;Decreased strength;Impaired flexibility;Postural dysfunction;Decreased mobility;Difficulty walking;Decreased range of motion;Pain;Decreased coordination   Rehab Potential Good   Clinical Impairments Affecting Rehab Potential hip pain    PT Frequency 2x / week   PT Duration 4 weeks   PT Treatment/Interventions ADLs/Self Care Home Management;Gait training;Neuromuscular re-education;Ultrasound;Stair training;Functional mobility training;Patient/family education;Passive range of motion;Cryotherapy;Therapeutic activities;Electrical Stimulation;Therapeutic exercise;Manual techniques;DME Instruction;Balance training   PT Next Visit Plan Manual/joint mobs to foot/ankle; mobility exercises; gait training; continue to use sneakers during sessions ratehr than work Watson given    Consulted and Agree with Plan of Care Patient        Problem List Patient Active Problem List   Diagnosis Date Noted  . History of colonic polyps   . Flatfoot 06/27/2015  . OA (osteoarthritis) of hip 03/20/2015  . Hyponatremia 10/24/2013  . OA (osteoarthritis) of knee 10/23/2013  . Hepatomegaly 09/01/2012  . Lynch syndrome 09/01/2012  . GERD (gastroesophageal reflux disease) 09/01/2012  . Knee pain 08/12/2011    Teena Irani, PTA/CLT [phone] 10/31/2015, 5:49 PM  [facility] [address] [place], [place], [address] Phone: [phone]   Fax:  [phone]  Name: Craig Scott MRN: [id number] Date of Birth: 1956-08-10

## 2015-11-05 ENCOUNTER — Encounter (HOSPITAL_COMMUNITY): Payer: Self-pay

## 2015-11-05 ENCOUNTER — Ambulatory Visit (HOSPITAL_COMMUNITY): Payer: BC Managed Care – PPO

## 2015-11-05 DIAGNOSIS — R531 Weakness: Secondary | ICD-10-CM

## 2015-11-05 DIAGNOSIS — M25571 Pain in right ankle and joints of right foot: Secondary | ICD-10-CM | POA: Diagnosis not present

## 2015-11-05 DIAGNOSIS — M25671 Stiffness of right ankle, not elsewhere classified: Secondary | ICD-10-CM

## 2015-11-05 DIAGNOSIS — R262 Difficulty in walking, not elsewhere classified: Secondary | ICD-10-CM

## 2015-11-05 DIAGNOSIS — M6281 Muscle weakness (generalized): Secondary | ICD-10-CM

## 2015-11-05 DIAGNOSIS — Z7409 Other reduced mobility: Secondary | ICD-10-CM

## 2015-11-05 NOTE — Therapy (Signed)
[place], [place], [address] Phone: [phone]   Fax:  [phone]  Physical Therapy Treatment  Patient Details  Name: Craig Scott MRN: [id number] Date of Birth: Jan 10, 1956 Referring Provider: Wylene Simmer MD   Encounter Date: 11/05/2015      PT End of Session - 11/05/15 1949    Visit Number 9   Number of Visits 15   Date for PT Re-Evaluation 11/14/15   Authorization Type BCBS    Authorization Time Period 09/19/15 to 11/17/15   PT Start Time 1648   PT Stop Time 1733   PT Time Calculation (min) 45 min   Activity Tolerance Patient tolerated treatment well;Patient limited by pain   Behavior During Therapy [facility] for tasks assessed/performed      Past Medical History  Diagnosis Date  . High cholesterol   . GERD (gastroesophageal reflux disease)   . Anxiety   . Neuropathy ([place])     both feet  . Arthritis     ra  . Lynch syndrome     has gene that causes cancer  . Tubular adenoma     Past Surgical History  Procedure Laterality Date  . Knee surgery Right     bilat   . Colonoscopy   12/09/01    RMR: Normal normal rectum/ A few scattered left-sided diverticula.  The remainder of the colonic  mucosa appeared normal  . Colonoscopy  12/15/2002    RMR: Normal rectum, scattered left-sided diverticula.  The remainder of the colonic mucosa appeared normal  . Colonoscopy  04/10/2004    RMR: Normal rectum/Sigmoid diverticula/The remainder of the colonic mucosa appeared normal  . Esophagogastroduodenoscopy  12/23/2005    RMR:   Esophagogastric peptic stricture with reflux esophagitis, status post  dilation as described above/ Small hiatal hernia, otherwise normal stomach.  Bulbar erosions otherwise normal D1 and D2  . Colonoscopy  12/23/2005    RMR: Normal rectum, scattered sigmoid diverticula Colonic mucosa appeared normal  . Colonoscopy    01/20/2008    RMR: Distal diminutive rectal polyps, status post cold  biopsy removal  otherwise, normal rectum/ biopsy removal; scattered sigmoid diverticula; and benign-appearing/ulcers about the ileocecal valve, status post biopsy.  Remainder of colonic mucosa and terminal mucosa appeared normal.  . Colonoscopy  05/22/2010    RMR: distal diminutive rectal polyp s/p bx otherwise normal/few scattered pancolonic diverticula  . Nose surgery      cartiledge removal  . Colonoscopy  09/16/2012    Dr.Rourk- normal rectum, few scattered pancolonic diverticulosis, one diminutive polyp in the base of the cecum. 3x3 area of hyper pigmentation i the mid descending segment, this was a flat benign-appearing area o/w the remainder of the colonic mucosa appeared normal. bx= tubular adenoma and benign colonic mucosa  . Eye surgery  as child  . Tympanostomy tube placement Right     early 2014  . Joint replacement  09/30/2010    Right knee  . Total knee arthroplasty Left 10/23/2013    Procedure: LEFT TOTAL KNEE ARTHROPLASTY;  Surgeon: [name], MD;  [facility];  Service: Orthopedics;  Laterality: Left;  . Total hip arthroplasty Left 03/20/2015    Procedure: LEFT TOTAL HIP ARTHROPLASTY ANTERIOR APPROACH;  Surgeon: Gaynelle Arabian, MD;  [facility];  Service: Orthopedics;  Laterality: Left;  . Bunionectomy with hammertoe reconstruction and gastroc slide Right 06/27/2015    Procedure: RIGHT GASTROC RECESSION/POSTERIOR TIBIAL TENOLYSIS/SECOND AND THIRD METATARSAL WEIL OSTEOTOMY AND HAMMERTOE  CORRECTION;  Surgeon: Wylene Simmer, MD;  Location: [facility];  Service: Orthopedics;  Laterality: Right;  . Calcaneal osteotomy Right 06/27/2015    Procedure: CALCANEAL OSTEOTOMY;  Surgeon: Wylene Simmer, MD;  Location: [facility];  Service: Orthopedics;  Laterality: Right;  . Colonoscopy with propofol N/A 09/05/2015    Procedure: COLONOSCOPY WITH PROPOFOL;  Surgeon: Daneil Dolin, MD;  [facility];  Service: Endoscopy;  Laterality: N/A;  0830  .  Polypectomy  09/05/2015    Procedure: POLYPECTOMY;  Surgeon: Daneil Dolin, MD;  [facility];  Service: Endoscopy;;    There were no vitals filed for this visit.  Visit Diagnosis:  Pain in joint, ankle and foot, right  Difficulty walking due to ankle and foot joint  Ankle stiffness, right  Weakness generalized  Decreased mobility and endurance  Muscle weakness      Subjective Assessment - 11/05/15 1657    Subjective Pt c/o R foot/ankle pain upon arrival to clinic that was rated an 8/10 on a VAS. Pt noted that he attributes his pain to working all day. However, he did further report that he "had a great day yesterday" with minimal R ankle/foot pain experienced. Pt voiced frustration with inconsistent progress, but admitted that he oftentimes pushes himself too much at work and at home.    Pertinent History Patient had extensive surgery on R foot including toes, heel, and arch; did not have therapy after this surgery. Has been working for the past three weeks with boot on; MD took him out of boot almost a week ago. Had a R hip replacement in July but cannot remember his approach/precautions.    Patient Stated Goals be able to wear regular shoes, walk normally    Currently in Pain? Yes   Pain Score 8    Pain Location Foot   Pain Orientation Right   Pain Descriptors / Indicators Aching;Sharp   Pain Type Chronic pain;Surgical pain   Pain Onset More than a month ago   Pain Frequency Constant   Aggravating Factors  WB activities    Pain Relieving Factors R LE elevation and pain meds    Effect of Pain on Daily Activities limiting his ability to ambulate long distances and complete work related activities   Multiple Pain Sites No                         [facility] Adult PT Treatment/Exercise - 11/05/15 0001    Manual Therapy   Manual Therapy Joint mobilization   Manual therapy comments performed separately from all other skilled intervetntions    Joint  Mobilization MTP flexion/extension, foot supination/pronation, calcaneal inversion/eversion, talar DF   Soft tissue mobilization retrograde massage of R foot and ankle in elevated position    Ankle Exercises: Stretches   Plantar Fascia Stretch 30 seconds;4 reps   Gastroc Stretch 30 seconds;Other (comment);4 reps  4-way    Ankle Exercises: Seated   Ankle Circles/Pumps 15 reps   BAPS Level 1;Sitting;15 reps   Other Seated Ankle Exercises R ankle 4-way isometics x 1 set of 15 reps with 5 sec hold    Other Seated Ankle Exercises pen coin 1x15, 2 second holds                 PT Education - 11/05/15 1958    Education provided Yes   Education Details Edcuated pt on pain management strategies, current/updated HEP, and R LE elevation  to address R ankle/foot edema    Person(s) Educated Patient   Methods Explanation;Demonstration;Tactile cues;Verbal cues   Comprehension Verbalized understanding;Returned demonstration;Tactile cues required;Need further instruction          PT Short Term Goals - 10/17/15 9833    PT SHORT TERM GOAL #1   Title Patient will improve R ankle ROM by 7-10 degrees on all planes in order to improve range and overall gait mechancis    Baseline 2/2- improving    Time 3   Period Weeks   Status On-going   PT SHORT TERM GOAL #2   Title Patient will be able to verballly describe the importance of proper, supportive footwear that is sized correctly, as well as how to size it correctly,  in order to reduce pain and improve overall mechanics during mobilty    Baseline 2/2- have discussed and will continue to do so    Time 3   Period Weeks   Status Partially Met   PT SHORT TERM GOAL #3   Title Patient will be independent in correctly and consistently performing appropraite HEP, to be updated PRN    Baseline 2/2- doing mostly every day    Time 3   Period Weeks   Status Achieved           PT Long Term Goals - 10/17/15 8250    PT LONG TERM GOAL #1   Title  Patietn will demonstrate significant improvements in gait, including equal step/stride lengths, reduced base of support, in order to enhance mobility and promote overall efficiency of gait    Baseline 2/2- still working on this    Time 6   Period Weeks   Status On-going   PT LONG TERM GOAL #2   Title Patient will be able to wear regular shoes on a regular basis secondary to improved ankle stabilty and strength, with pain no more than 2/10 and efficient gait mechanics in order to improve comfort of overall mobilty    Baseline 2/2- just introduced regular shoes last session    Time 6   Period Weeks   Status On-going   PT LONG TERM GOAL #3   Title Patient will demonstate 5/5 strength in R ankle in order to enhance overall stability and improve efficiency of mobltiy    Time 6   Period Weeks   Status On-going               Plan - 11/05/15 1716    Clinical Impression Statement PT tx focused on edema management, manual techniques to improve jt mobility/ROM, basic R ankle ROM ther ex, R ankle isometrics, and intrinsic foot strengthening. Standing ther ex were held this visit due to increased R foot pain reported upon arrival to PT tx. Pt tolerated today's tx well with pain reduced from an 8/10 to 6/10 on a VAS. Re-introduced pen and coin (doming) ther ex with instructions provided to pt to complete as part of HEP to improve the intrinsic muscle strength of his R foot. Minimal muscle activation assessed with pen and coin ther ex with initial reps. Improved activation noted once tactile and verbal cues were provided. Instructed the pt on the importance of pain management with use of ice, elevation, and proper shoe wear. Pt verbalized full understanding. Pt would benefit from skilled PT in order to address current pain, strength, and ROM deficits.    Pt will benefit from skilled therapeutic intervention in order to improve on the following deficits Abnormal gait;Decreased endurance;Improper body  mechanics;Decreased strength;Impaired flexibility;Postural dysfunction;Decreased mobility;Difficulty walking;Decreased range of motion;Pain;Decreased coordination   Rehab Potential Good   Clinical Impairments Affecting Rehab Potential hip pain    PT Frequency 2x / week   PT Duration 4 weeks   PT Treatment/Interventions ADLs/Self Care Home Management;Gait training;Neuromuscular re-education;Ultrasound;Stair training;Functional mobility training;Patient/family education;Passive range of motion;Cryotherapy;Therapeutic activities;Electrical Stimulation;Therapeutic exercise;Manual techniques;DME Instruction;Balance training   PT Home Exercise Plan Reviewed HEP with addition of pen and coin ( doming) intrinsic foot muscle strengthening. Handout was not provided. Pt able to verbally demo full understnaidng with added ther ex to HEP.    Consulted and Agree with Plan of Care Patient        Problem List Patient Active Problem List   Diagnosis Date Noted  . History of colonic polyps   . Flatfoot 06/27/2015  . OA (osteoarthritis) of hip 03/20/2015  . Hyponatremia 10/24/2013  . OA (osteoarthritis) of knee 10/23/2013  . Hepatomegaly 09/01/2012  . Lynch syndrome 09/01/2012  . GERD (gastroesophageal reflux disease) 09/01/2012  . Knee pain 08/12/2011    Garen Lah, PT, DPT   11/05/2015, 8:06 PM  [facility] [address] [place], [place], [address] Phone: [phone]   Fax:  [phone]  Name: Craig Scott MRN: [id number] Date of Birth: [birth year]

## 2015-11-07 ENCOUNTER — Ambulatory Visit (HOSPITAL_COMMUNITY): Payer: BC Managed Care – PPO | Admitting: Physical Therapy

## 2015-11-07 DIAGNOSIS — R531 Weakness: Secondary | ICD-10-CM

## 2015-11-07 DIAGNOSIS — M6281 Muscle weakness (generalized): Secondary | ICD-10-CM

## 2015-11-07 DIAGNOSIS — M25571 Pain in right ankle and joints of right foot: Secondary | ICD-10-CM | POA: Diagnosis not present

## 2015-11-07 DIAGNOSIS — Z7409 Other reduced mobility: Secondary | ICD-10-CM

## 2015-11-07 DIAGNOSIS — R262 Difficulty in walking, not elsewhere classified: Secondary | ICD-10-CM

## 2015-11-07 DIAGNOSIS — M25671 Stiffness of right ankle, not elsewhere classified: Secondary | ICD-10-CM

## 2015-11-07 NOTE — Therapy (Signed)
[place], [place], [address] Phone: [phone]   Fax:  [phone]  Physical Therapy Treatment  Patient Details  Name: Craig Scott MRN: [id number] Date of Birth: 26-Jun-1956 Referring Provider: Wylene Simmer MD   Encounter Date: 11/07/2015      PT End of Session - 11/07/15 1758    Visit Number 10   Number of Visits 15   Date for PT Re-Evaluation 11/14/15   Authorization Type BC[employer]    Authorization Time Period 09/19/15 to 11/17/15   PT Start Time 1648   PT Stop Time 1735   PT Time Calculation (min) 47 min   Activity Tolerance Patient tolerated treatment well;Patient limited by pain   Behavior During Therapy [facility] for tasks assessed/performed      Past Medical History  Diagnosis Date  . High cholesterol   . GERD (gastroesophageal reflux disease)   . Anxiety   . Neuropathy ([place])     both feet  . Arthritis     ra  . Lynch syndrome     has gene that causes cancer  . Tubular adenoma     Past Surgical History  Procedure Laterality Date  . Knee surgery Right     bilat   . Colonoscopy   12/09/01    RMR: Normal normal rectum/ A few scattered left-sided diverticula.  The remainder of the colonic  mucosa appeared normal  . Colonoscopy  12/15/2002    RMR: Normal rectum, scattered left-sided diverticula.  The remainder of the colonic mucosa appeared normal  . Colonoscopy  04/10/2004    RMR: Normal rectum/Sigmoid diverticula/The remainder of the colonic mucosa appeared normal  . Esophagogastroduodenoscopy  12/23/2005    RMR:   Esophagogastric peptic stricture with reflux esophagitis, status post  dilation as described above/ Small hiatal hernia, otherwise normal stomach.  Bulbar erosions otherwise normal D1 and D2  . Colonoscopy  12/23/2005    RMR: Normal rectum, scattered sigmoid diverticula Colonic mucosa appeared normal  . Colonoscopy    01/20/2008    RMR: Distal diminutive rectal polyps, status post cold  biopsy removal  otherwise, normal rectum/ biopsy removal; scattered sigmoid diverticula; and benign-appearing/ulcers about the ileocecal valve, status post biopsy.  Remainder of colonic mucosa and terminal mucosa appeared normal.  . Colonoscopy  05/22/2010    RMR: distal diminutive rectal polyp s/p bx otherwise normal/few scattered pancolonic diverticula  . Nose surgery      cartiledge removal  . Colonoscopy  09/16/2012    Dr.Rourk- normal rectum, few scattered pancolonic diverticulosis, one diminutive polyp in the base of the cecum. 3x3 area of hyper pigmentation i the mid descending segment, this was a flat benign-appearing area o/w the remainder of the colonic mucosa appeared normal. bx= tubular adenoma and benign colonic mucosa  . Eye surgery  as child  . Tympanostomy tube placement Right     early 2014  . Joint replacement  09/30/2010    Right knee  . Total knee arthroplasty Left 10/23/2013    Procedure: LEFT TOTAL KNEE ARTHROPLASTY;  Surgeon: [name], MD;  Location: [facility];  Service: Orthopedics;  Laterality: Left;  . Total hip arthroplasty Left 03/20/2015    Procedure: LEFT TOTAL HIP ARTHROPLASTY ANTERIOR APPROACH;  Surgeon: Gaynelle Arabian, MD;  Location: [facility];  Service: Orthopedics;  Laterality: Left;  . Bunionectomy with hammertoe reconstruction and gastroc slide Right 06/27/2015    Procedure: RIGHT GASTROC RECESSION/POSTERIOR TIBIAL TENOLYSIS/SECOND AND THIRD METATARSAL WEIL OSTEOTOMY AND HAMMERTOE  CORRECTION;  Surgeon: Wylene Simmer, MD;  Location: [facility];  Service: Orthopedics;  Laterality: Right;  . Calcaneal osteotomy Right 06/27/2015    Procedure: CALCANEAL OSTEOTOMY;  Surgeon: Wylene Simmer, MD;  Location: [facility];  Service: Orthopedics;  Laterality: Right;  . Colonoscopy with propofol N/A 09/05/2015    Procedure: COLONOSCOPY WITH PROPOFOL;  Surgeon: Daneil Dolin, MD;  [facility];  Service: Endoscopy;  Laterality: N/A;  0830  .  Polypectomy  09/05/2015    Procedure: POLYPECTOMY;  Surgeon: Daneil Dolin, MD;  [facility];  Service: Endoscopy;;    There were no vitals filed for this visit.  Visit Diagnosis:  Pain in joint, ankle and foot, right  Difficulty walking due to ankle and foot joint  Ankle stiffness, right  Weakness generalized  Decreased mobility and endurance  Muscle weakness      Subjective Assessment - 11/07/15 1704    Subjective PT states his pain is 8/10.  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    Currently in Pain? Yes   Pain Score 8    Pain Location Foot   Pain Orientation Right   Pain Descriptors / Indicators Aching   Pain Type Chronic pain                         [facility] Adult PT Treatment/Exercise - 11/07/15 1758    Manual Therapy   Manual Therapy Joint mobilization   Manual therapy comments performed separately from all other skilled intervetntions    Joint Mobilization MTP flexion/extension, foot supination/pronation, calcaneal inversion/eversion, talar DF   Soft tissue mobilization retrograde massage of R foot and ankle in elevated position    Ankle Exercises: Seated   Towel Crunch 5 reps   Towel Inversion/Eversion 5 reps   BAPS Sitting;15 reps;Level 2   Ankle Exercises: Stretches   Plantar Fascia Stretch 30 seconds;4 reps   Slant Board Stretch 3 reps;30 seconds  3 ways                  PT Short Term Goals - 10/17/15 6283    PT SHORT TERM GOAL #1   Title Patient will improve R ankle ROM by 7-10 degrees on all planes in order to improve range and overall gait mechancis    Baseline 2/2- improving    Time 3   Period Weeks   Status On-going   PT SHORT TERM GOAL #2   Title Patient will be able to verballly describe the importance of proper, supportive footwear that is sized correctly, as well as how to size it correctly,  in order to reduce pain and  improve overall mechanics during mobilty    Baseline 2/2- have discussed and will continue to do so    Time 3   Period Weeks   Status Partially Met   PT SHORT TERM GOAL #3   Title Patient will be independent in correctly and consistently performing appropraite HEP, to be updated PRN    Baseline 2/2- doing mostly every day    Time 3   Period Weeks   Status Achieved           PT Long Term Goals - 10/17/15 1517    PT LONG TERM GOAL #1   Title Patietn will demonstrate significant improvements in gait, including equal step/stride lengths, reduced  base of support, in order to enhance mobility and promote overall efficiency of gait    Baseline 2/2- still working on this    Time 6   Period Weeks   Status On-going   PT LONG TERM GOAL #2   Title Patient will be able to wear regular shoes on a regular basis secondary to improved ankle stabilty and strength, with pain no more than 2/10 and efficient gait mechanics in order to improve comfort of overall mobilty    Baseline 2/2- just introduced regular shoes last session    Time 6   Period Weeks   Status On-going   PT LONG TERM GOAL #3   Title Patient will demonstate 5/5 strength in R ankle in order to enhance overall stability and improve efficiency of mobltiy    Time 6   Period Weeks   Status On-going               Plan - 11/07/15 1802    Clinical Impression Statement Continued focus on improving joint mobility, muscular control, ROM and overall pain.  Continued to hold majority of weight bearing exercises with exception of stretches due to high pain reporting.  Advanced seated BAPS to level #2 with most difficulty completing Circles and lateral movements. Manual completed with noted improvment in ROM and edema.  Pt verbalized reduced pain at EOS to 6/10, however noted antalgia when initially began to walk after returning to standing from supine.     Pt will benefit from skilled therapeutic intervention in order to improve on the  following deficits Abnormal gait;Decreased endurance;Improper body mechanics;Decreased strength;Impaired flexibility;Postural dysfunction;Decreased mobility;Difficulty walking;Decreased range of motion;Pain;Decreased coordination   Rehab Potential Good   Clinical Impairments Affecting Rehab Potential hip pain    PT Frequency 2x / week   PT Duration 4 weeks   PT Treatment/Interventions ADLs/Self Care Home Management;Gait training;Neuromuscular re-education;Ultrasound;Stair training;Functional mobility training;Patient/family education;Passive range of motion;Cryotherapy;Therapeutic activities;Electrical Stimulation;Therapeutic exercise;Manual techniques;DME Instruction;Balance training   PT Next Visit Plan [employer] Manual/joint mobs to foot/ankle; mobility exercises; gait training   Consulted and Agree with Plan of Care Patient        Problem List Patient Active Problem List   Diagnosis Date Noted  . History of colonic polyps   . Flatfoot 06/27/2015  . OA (osteoarthritis) of hip 03/20/2015  . Hyponatremia 10/24/2013  . OA (osteoarthritis) of knee 10/23/2013  . Hepatomegaly 09/01/2012  . Lynch syndrome 09/01/2012  . GERD (gastroesophageal reflux disease) 09/01/2012  . Knee pain 08/12/2011    Teena Irani, PTA/CLT [phone]  11/07/2015, 6:10 PM  [facility] [address] [place], [place], [address] Phone: [phone]   Fax:  [phone]  Name: Craig Scott MRN: [id number] Date of Birth: [birth year]

## 2015-11-12 ENCOUNTER — Ambulatory Visit (HOSPITAL_COMMUNITY): Payer: BC Managed Care – PPO

## 2015-11-12 DIAGNOSIS — M25571 Pain in right ankle and joints of right foot: Secondary | ICD-10-CM | POA: Diagnosis not present

## 2015-11-12 DIAGNOSIS — Z7409 Other reduced mobility: Secondary | ICD-10-CM

## 2015-11-12 DIAGNOSIS — R262 Difficulty in walking, not elsewhere classified: Secondary | ICD-10-CM

## 2015-11-12 DIAGNOSIS — M6281 Muscle weakness (generalized): Secondary | ICD-10-CM

## 2015-11-12 DIAGNOSIS — R531 Weakness: Secondary | ICD-10-CM

## 2015-11-12 DIAGNOSIS — M25671 Stiffness of right ankle, not elsewhere classified: Secondary | ICD-10-CM

## 2015-11-12 NOTE — Therapy (Signed)
[place], [place], [address] Phone: [phone]   Fax:  [phone]  Physical Therapy Treatment  Patient Details  Name: Craig Scott MRN: [id number] Date of Birth: 1956/03/22 Referring Provider: Dr. Wylene Simmer  Encounter Date: 11/12/2015      Craig Scott End of Session - 11/12/15 1600    Visit Number 11   Number of Visits 23   Date for Craig Scott Re-Evaluation 12/12/15   Authorization Type BCBS    Authorization Time Period 11/18/15 to 12/30/15   Craig Scott Start Time 1600   Craig Scott Stop Time 1646   Craig Scott Time Calculation (min) 46 min   Activity Tolerance Patient tolerated treatment well   Behavior During Therapy [facility] for tasks assessed/performed      Past Medical History  Diagnosis Date  . High cholesterol   . GERD (gastroesophageal reflux disease)   . Anxiety   . Neuropathy ([place])     both feet  . Arthritis     ra  . Lynch syndrome     has gene that causes cancer  . Tubular adenoma     Past Surgical History  Procedure Laterality Date  . Knee surgery Right     bilat   . Colonoscopy   12/09/01    RMR: Normal normal rectum/ A few scattered left-sided diverticula.  The remainder of the colonic  mucosa appeared normal  . Colonoscopy  12/15/2002    RMR: Normal rectum, scattered left-sided diverticula.  The remainder of the colonic mucosa appeared normal  . Colonoscopy  04/10/2004    RMR: Normal rectum/Sigmoid diverticula/The remainder of the colonic mucosa appeared normal  . Esophagogastroduodenoscopy  12/23/2005    RMR:   Esophagogastric peptic stricture with reflux esophagitis, status post  dilation as described above/ Small hiatal hernia, otherwise normal stomach.  Bulbar erosions otherwise normal D1 and D2  . Colonoscopy  12/23/2005    RMR: Normal rectum, scattered sigmoid diverticula Colonic mucosa appeared normal  . Colonoscopy    01/20/2008    RMR: Distal diminutive rectal polyps, status post cold biopsy removal  otherwise,  normal rectum/ biopsy removal; scattered sigmoid diverticula; and benign-appearing/ulcers about the ileocecal valve, status post biopsy.  Remainder of colonic mucosa and terminal mucosa appeared normal.  . Colonoscopy  05/22/2010    RMR: distal diminutive rectal polyp s/p bx otherwise normal/few scattered pancolonic diverticula  . Nose surgery      cartiledge removal  . Colonoscopy  09/16/2012    Dr.Rourk- normal rectum, few scattered pancolonic diverticulosis, one diminutive polyp in the base of the cecum. 3x3 area of hyper pigmentation i the mid descending segment, this was a flat benign-appearing area o/w the remainder of the colonic mucosa appeared normal. bx= tubular adenoma and benign colonic mucosa  . Eye surgery  as child  . Tympanostomy tube placement Right     early 2014  . Joint replacement  09/30/2010    Right knee  . Total knee arthroplasty Left 10/23/2013    Procedure: LEFT TOTAL KNEE ARTHROPLASTY;  Surgeon: [name], MD;  [facility];  Service: Orthopedics;  Laterality: Left;  . Total hip arthroplasty Left 03/20/2015    Procedure: LEFT TOTAL HIP ARTHROPLASTY ANTERIOR APPROACH;  Surgeon: Gaynelle Arabian, MD;  [facility];  Service: Orthopedics;  Laterality: Left;  . Bunionectomy with hammertoe reconstruction and gastroc slide Right 06/27/2015    Procedure: RIGHT GASTROC RECESSION/POSTERIOR TIBIAL TENOLYSIS/SECOND AND THIRD METATARSAL WEIL OSTEOTOMY AND HAMMERTOE CORRECTION;  Surgeon: Jenny Reichmann  Doran Durand, MD;  Location: [facility];  Service: Orthopedics;  Laterality: Right;  . Calcaneal osteotomy Right 06/27/2015    Procedure: CALCANEAL OSTEOTOMY;  Surgeon: Wylene Simmer, MD;  Location: [facility];  Service: Orthopedics;  Laterality: Right;  . Colonoscopy with propofol N/A 09/05/2015    Procedure: COLONOSCOPY WITH PROPOFOL;  Surgeon: Daneil Dolin, MD;  [facility];  Service: Endoscopy;  Laterality: N/A;  0830  . Polypectomy  09/05/2015     Procedure: POLYPECTOMY;  Surgeon: Daneil Dolin, MD;  [facility];  Service: Endoscopy;;    There were no vitals filed for this visit.  Visit Diagnosis:  Pain in joint, ankle and foot, right - Plan: Craig Scott plan of care cert/re-cert  Difficulty walking due to ankle and foot joint - Plan: Craig Scott plan of care cert/re-cert  Ankle stiffness, right - Plan: Craig Scott plan of care cert/re-cert  Weakness generalized - Plan: Craig Scott plan of care cert/re-cert  Decreased mobility and endurance - Plan: Craig Scott plan of care cert/re-cert  Muscle weakness - Plan: Craig Scott plan of care cert/re-cert      Subjective Assessment - 11/12/15 1609    Subjective Craig Scott reported a 50% improvement since beginning with Craig Scott with less severe pain and improved tolerance with long distance ambulation. R foot pain was rated a 3/10 on a VAS upon arrival. Craig Scott reported improved compliance with his current HEP and pain/edema management strategies. Craig Scott has noticed less R ankle/foot edema since he began to elevate his R LE 3x/day. Furthermore, Craig Scott noted that he was able to donn regular sneakers today, which surprisingly were more comfortable than his work boots. He reports continued weakness, tightness, and pain of the R ankle/foot and is in agreement with continued Craig Scott.    Pertinent History Patient had extensive surgery on R foot including toes, heel, and arch; did not have therapy after this surgery. Has been working for the past three weeks with boot on; MD took him out of boot almost a week ago. Had a R hip replacement in July but cannot remember his approach/precautions.    Limitations Standing;Walking   How long can you stand comfortably? 11/12/15= 1-2 hours   How long can you walk comfortably? 11/12/15= 40 minutes    Patient Stated Goals be able to wear regular shoes, walk normally    Currently in Pain? Yes   Pain Score 3   R foot/ankle 0-10/10 on a VAS    Pain Location Foot  toes and heel    Pain Orientation Right   Pain Descriptors /  Indicators Aching;Burning;Sharp   Pain Type Surgical pain;Chronic pain   Pain Radiating Towards N/A   Pain Onset More than a month ago   Pain Frequency Intermittent   Aggravating Factors  WB activities and long distance ambulation    Pain Relieving Factors R LE elevation and pain meds    Effect of Pain on Daily Activities limiting his ability to ambulate long distances and ambulate on unlevel terrain   Multiple Pain Sites No            [facility] Craig Scott Assessment - 11/12/15 0001    Assessment   Medical Diagnosis Orthopedic aftercare/posterior tibial tendonitis R    Referring Provider Dr. Wylene Simmer   Onset Date/Surgical Date 06/27/15   Precautions   Precautions None   Precaution Comments total hip replacement last july; extensive surgery R foot    Restrictions   Weight Bearing Restrictions No   Figure 8 Edema  Figure 8 - Right  58.0 cm    Figure 8 - Left  57.0 cm   Posture/Postural Control   Posture Comments Genu varus    ROM / Strength   AROM / PROM / Strength Strength   AROM   Right Ankle Dorsiflexion 17   Right Ankle Plantar Flexion 32   Right Ankle Inversion 10   Right Ankle Eversion 10   Left Ankle Dorsiflexion 15   Left Ankle Plantar Flexion --  wfl   Left Ankle Inversion 24   Left Ankle Eversion 20   Strength   Strength Assessment Site --   Right/Left Hip Right;Left   Right Hip Flexion 5/5   Right Hip Extension 4+/5   Right Hip ABduction 4+/5   Left Hip Flexion 4/5   Left Hip Extension 4-/5   Left Hip ABduction 4-/5   Right Knee Flexion 5/5   Right Knee Extension 5/5   Left Knee Flexion 5/5   Left Knee Extension 5/5   Right Ankle Dorsiflexion 5/5   Right Ankle Plantar Flexion 3-/5   Right Ankle Inversion 4-/5  range limited    Right Ankle Eversion 4-/5  range limited    Left Ankle Dorsiflexion 5/5   Left Ankle Plantar Flexion 4/5   Left Ankle Inversion 5/5   Left Ankle Eversion 5/5   Palpation   Palpation comment continues to display rigidity in  first/second metatarsal, midfoot, calcaneous, and talus                      [facility] Adult Craig Scott Treatment/Exercise - 11/12/15 0001    Manual Therapy   Manual Therapy Joint mobilization   Manual therapy comments performed separately from all other skilled intervetntions    Joint Mobilization MTP flexion/extension, foot supination/pronation, calcaneal inversion/eversion, talar DF   Ankle Exercises: Seated   Ankle Circles/Pumps 15 reps   BAPS Sitting;15 reps;Other (comment)  use of rockerboard in PF <>DF and Inv<>Eversion    Other Seated Ankle Exercises R ankle isometics x 1 set of 15 reps with 5 sec hold into inversion and eversion    Other Seated Ankle Exercises pen coin 1x15, 2 second holds    Ankle Exercises: Stretches   Plantar Fascia Stretch 30 seconds;4 reps   Gastroc Stretch 30 seconds;Other (comment);4 reps  manually        Ankle Exercises: Standing       Ankle Exercises: Supine   T-Band R ankle PF and DF x 2 sets of 10 reps  with green thera-band                 Craig Scott Education - 11/12/15 1933    Education provided Yes   Education Details Educated Craig Scott on current HEP, use of cryotherapy/elevation to manage pain, and reassessment findings    Person(s) Educated Patient   Methods Explanation   Comprehension Verbalized understanding          Craig Scott Short Term Goals - 11/12/15 1938    Craig Scott SHORT TERM GOAL #1   Title Patient will improve R ankle ROM by 7-10 degrees on all planes in order to improve range and overall gait mechancis    Baseline progressing towards goal; Craig Scott continues to be limited with PF, inversion, and eversion AROM   Time 3   Period Weeks   Status On-going   Craig Scott SHORT TERM GOAL #2   Title Patient will be able to verballly describe the importance of proper, supportive footwear that is sized correctly,  as well as how to size it correctly,  in order to reduce pain and improve overall mechanics during mobilty    Time 3   Period Weeks   Status  Achieved   Craig Scott SHORT TERM GOAL #3   Title Patient will be independent in correctly and consistently performing appropraite HEP, to be updated PRN    Time 3   Period Weeks   Status Achieved                                                 Craig Scott Long Term Goals - 11/12/15 1940    Craig Scott LONG TERM GOAL #1   Title Patient will demonstrate significant improvements in gait, including equal step/stride lengths, reduced base of support, in order to enhance mobility and promote overall efficiency of gait    Baseline progressing towards goal   Time 6   Period Weeks   Status On-going   Craig Scott LONG TERM GOAL #2   Title Patient will be able to wear regular shoes on a regular basis secondary to improved ankle stabilty and strength, with pain no more than 2/10 and efficient gait mechanics in order to improve comfort of overall mobilty    Baseline progressing towards goal; Craig Scott was able to transition to a normal shoe this date with good tolerance reported.    Time 6   Period Weeks   Status On-going   Craig Scott LONG TERM GOAL #3   Title Patient will demonstate 5/5 strength in R ankle in order to enhance overall stability and improve efficiency of mobltiy    Baseline progressing towards goal   Time 6   Period Weeks   Status On-going   Craig Scott LONG TERM GOAL #4   Title Patient will demo decreased R ankle edema by 0.5 cm with figure-8 measurement in order to improve comfort with wearing regular shoes and in order to imrpove R ankle mobility.    Baseline --   Time 6   Period Weeks   Status New               Plan - 11/12/15 1936    Clinical Impression Statement Physical therapy re-assessment completed today. Craig Scott is making steady progress towards stated Craig Scott goals with improved R ankle/foot strength, less severe/frequent R foot pain, and improved tolerance with WB activities; however, he continues to present with significant R ankle hypomobility and weakness, specifically with R ankle inversion and  eversion. Palpable edema was assessed of the R ankle with a 1.0 cm difference when figure-8 measurements were assessed bilaterally. Craig Scott is more compliant with pain and edema management strategies within the last two weeks and has reported less severe pain. Craig Scott arrived to the clinic in regular sneakers with less antalgic gait pattern demo compared to previous Craig Scott visits. According to the Craig Scott, his regular sneakers are more comfortable than his work boots. Craig Scott tx was focused on R ankle/foot AROM/AAROM ther ex, resisted R ankle 4-way, and manual techniques to improve joint mobility. Good tolerance reported with today's Craig Scott tx with R foot pain decreased to a 0/10 on a VAS in a seated position and 2/10 on a VAS in standing. Craig Scott would continue to benefit from skilled Craig Scott for an additional 2x/week for 6 weeks to address current impairments of R ankle/foot weakness, decreased R ankle/foot mobility and ROM, R foot  pain, and edema. Craig Scott is in agreement with continued skilled Craig Scott.    Craig Scott will benefit from skilled therapeutic intervention in order to improve on the following deficits Abnormal gait;Decreased endurance;Improper body mechanics;Decreased strength;Impaired flexibility;Postural dysfunction;Decreased mobility;Difficulty walking;Decreased range of motion;Pain;Decreased coordination   Rehab Potential Good   Clinical Impairments Affecting Rehab Potential hip pain    Craig Scott Frequency 2x / week   Craig Scott Duration 6 weeks   Craig Scott Treatment/Interventions ADLs/Self Care Home Management;Gait training;Neuromuscular re-education;Ultrasound;Stair training;Functional mobility training;Patient/family education;Passive range of motion;Cryotherapy;Therapeutic activities;Electrical Stimulation;Therapeutic exercise;Manual techniques;DME Instruction;Balance training   Craig Scott Next Visit Plan Continue with manual therapy techniques to improve R ankle ROM and pain, resisted ankle DF/PF with green TB, resisted ankle inversion/eversion with red  thera-band, BAPS board on level 4, and modalities to reduce pain    Craig Scott Home Exercise Plan Reviewed HEP with no additions made     Consulted and Agree with Plan of Care Patient        Problem List Patient Active Problem List   Diagnosis Date Noted  . History of colonic polyps   . Flatfoot 06/27/2015  . OA (osteoarthritis) of hip 03/20/2015  . Hyponatremia 10/24/2013  . OA (osteoarthritis) of knee 10/23/2013  . Hepatomegaly 09/01/2012  . Lynch syndrome 09/01/2012  . GERD (gastroesophageal reflux disease) 09/01/2012  . Knee pain 08/12/2011    Craig Scott, Craig Scott, Craig Scott   11/12/2015, 8:12 PM  [facility] [address] [place], [place], [address] Phone: [phone]   Fax:  [phone]  Name: Craig Scott MRN: [id number] Date of Birth: 1956/05/22

## 2015-11-14 ENCOUNTER — Ambulatory Visit (HOSPITAL_COMMUNITY): Payer: BC Managed Care – PPO | Attending: Orthopedic Surgery

## 2015-11-14 DIAGNOSIS — Z7409 Other reduced mobility: Secondary | ICD-10-CM | POA: Diagnosis present

## 2015-11-14 DIAGNOSIS — M25571 Pain in right ankle and joints of right foot: Secondary | ICD-10-CM | POA: Insufficient documentation

## 2015-11-14 DIAGNOSIS — M25671 Stiffness of right ankle, not elsewhere classified: Secondary | ICD-10-CM | POA: Diagnosis present

## 2015-11-14 DIAGNOSIS — M6281 Muscle weakness (generalized): Secondary | ICD-10-CM | POA: Insufficient documentation

## 2015-11-14 DIAGNOSIS — R262 Difficulty in walking, not elsewhere classified: Secondary | ICD-10-CM

## 2015-11-14 DIAGNOSIS — R531 Weakness: Secondary | ICD-10-CM | POA: Insufficient documentation

## 2015-11-14 NOTE — Therapy (Signed)
[place], [place], [address] Phone: [phone]   Fax:  [phone]  Physical Therapy Treatment  Patient Details  Name: Craig Scott MRN: [id number] Date of Birth: 11/19/1955 Referring Provider: Dr. Wylene Simmer  Encounter Date: 11/14/2015      PT End of Session - 11/14/15 1656    Visit Number 12   Number of Visits 23   Date for PT Re-Evaluation 12/12/15   Authorization Type BCBS    Authorization Time Period 11/18/15 to 12/30/15   PT Start Time 1602   PT Stop Time 1655   PT Time Calculation (min) 53 min   Activity Tolerance Patient tolerated treatment well   Behavior During Therapy [facility] for tasks assessed/performed      Past Medical History  Diagnosis Date  . High cholesterol   . GERD (gastroesophageal reflux disease)   . Anxiety   . Neuropathy ([place])     both feet  . Arthritis     ra  . Lynch syndrome     has gene that causes cancer  . Tubular adenoma     Past Surgical History  Procedure Laterality Date  . Knee surgery Right     bilat   . Colonoscopy   12/09/01    RMR: Normal normal rectum/ A few scattered left-sided diverticula.  The remainder of the colonic  mucosa appeared normal  . Colonoscopy  12/15/2002    RMR: Normal rectum, scattered left-sided diverticula.  The remainder of the colonic mucosa appeared normal  . Colonoscopy  04/10/2004    RMR: Normal rectum/Sigmoid diverticula/The remainder of the colonic mucosa appeared normal  . Esophagogastroduodenoscopy  12/23/2005    RMR:   Esophagogastric peptic stricture with reflux esophagitis, status post  dilation as described above/ Small hiatal hernia, otherwise normal stomach.  Bulbar erosions otherwise normal D1 and D2  . Colonoscopy  12/23/2005    RMR: Normal rectum, scattered sigmoid diverticula Colonic mucosa appeared normal  . Colonoscopy    01/20/2008    RMR: Distal diminutive rectal polyps, status post cold biopsy removal  otherwise,  normal rectum/ biopsy removal; scattered sigmoid diverticula; and benign-appearing/ulcers about the ileocecal valve, status post biopsy.  Remainder of colonic mucosa and terminal mucosa appeared normal.  . Colonoscopy  05/22/2010    RMR: distal diminutive rectal polyp s/p bx otherwise normal/few scattered pancolonic diverticula  . Nose surgery      cartiledge removal  . Colonoscopy  09/16/2012    Dr.Rourk- normal rectum, few scattered pancolonic diverticulosis, one diminutive polyp in the base of the cecum. 3x3 area of hyper pigmentation i the mid descending segment, this was a flat benign-appearing area o/w the remainder of the colonic mucosa appeared normal. bx= tubular adenoma and benign colonic mucosa  . Eye surgery  as child  . Tympanostomy tube placement Right     early 2014  . Joint replacement  09/30/2010    Right knee  . Total knee arthroplasty Left 10/23/2013    Procedure: LEFT TOTAL KNEE ARTHROPLASTY;  Surgeon: [name], MD;  Location: [facility];  Service: Orthopedics;  Laterality: Left;  . Total hip arthroplasty Left 03/20/2015    Procedure: LEFT TOTAL HIP ARTHROPLASTY ANTERIOR APPROACH;  Surgeon: Gaynelle Arabian, MD;  Location: [facility];  Service: Orthopedics;  Laterality: Left;  . Bunionectomy with hammertoe reconstruction and gastroc slide Right 06/27/2015    Procedure: RIGHT GASTROC RECESSION/POSTERIOR TIBIAL TENOLYSIS/SECOND AND THIRD METATARSAL WEIL OSTEOTOMY AND HAMMERTOE CORRECTION;  Surgeon: Jenny Reichmann  Doran Durand, MD;  Location: [place];  Service: Orthopedics;  Laterality: Right;  . Calcaneal osteotomy Right 06/27/2015    Procedure: CALCANEAL OSTEOTOMY;  Surgeon: Wylene Simmer, MD;  Location: [place];  Service: Orthopedics;  Laterality: Right;  . Colonoscopy with propofol N/A 09/05/2015    Procedure: COLONOSCOPY WITH PROPOFOL;  Surgeon: Daneil Dolin, MD;  [facility];  Service: Endoscopy;  Laterality: N/A;  0830  . Polypectomy  09/05/2015     Procedure: POLYPECTOMY;  Surgeon: Daneil Dolin, MD;  [facility];  Service: Endoscopy;;    There were no vitals filed for this visit.  Visit Diagnosis:  Pain in joint, ankle and foot, right  Difficulty walking due to ankle and foot joint  Ankle stiffness, right  Weakness generalized  Decreased mobility and endurance  Muscle weakness      Subjective Assessment - 11/14/15 1605    Subjective Pt noted improved comfort in his normal sneakers compared to his work boots. Pt stated " my foot feels better now that I have my shoes on".    Pertinent History Patient had extensive surgery on R foot including toes, heel, and arch; did not have therapy after this surgery. Has been working for the past three weeks with boot on; MD took him out of boot almost a week ago. Had a R hip replacement in July but cannot remember his approach/precautions.    Limitations Standing;Walking   Currently in Pain? Yes   Pain Score 4    Pain Location Foot   Pain Orientation Right   Pain Descriptors / Indicators Aching;Burning   Pain Type Surgical pain;Chronic pain   Pain Onset More than a month ago   Pain Frequency Intermittent   Aggravating Factors  Wearing his work boots    Pain Relieving Factors R LE elevation and pain meds    Effect of Pain on Daily Activities limiting his ability to ambulate long distances and ambulate on unlevel terrain    Multiple Pain Sites No                         [facility] Adult PT Treatment/Exercise - 11/14/15 0001    Manual Therapy   Manual Therapy Joint mobilization;Passive ROM   Manual therapy comments performed separately from all other skilled intervetntions    Joint Mobilization MTP flexion/extension, foot supination/pronation, calcaneal inversion/eversion, talar DF   Soft tissue mobilization --   Passive ROM R ankle PROM in all directions; great toe MTP extension in long sit position    Ankle Exercises: Seated   Ankle Circles/Pumps 15 reps    Towel Crunch Weights (lbs) 1 # x 2 sets of 10 reps    Towel Inversion/Eversion Other (comment)  2 sets of 10 reps with 1# added onto towel    Heel Raises 15 reps   BAPS Sitting;15 reps;Other (comment)  counter clockwise and clockwise   Other Seated Ankle Exercises pen coin 1x15, 2 second holds    Ankle Exercises: Stretches   Plantar Fascia Stretch 30 seconds;4 reps   Gastroc Stretch 30 seconds;Other (comment);4 reps   Slant Board Stretch Other (comment)   Ankle Exercises: Supine   T-Band R ankle 4-way x 2 sets of 10 reps with red and green thera-band   green TB= DF/PF and Red TB= inv/eversion                 PT Education - 11/14/15 1656  Education provided Yes   Education Details cryotherapy and elevation to manage pain and edema, and slow progression to wearing his regular sneakers to avoid exacerbation    Person(s) Educated Patient   Methods Explanation   Comprehension Verbalized understanding          PT Short Term Goals - 11/12/15 1938    PT SHORT TERM GOAL #1   Title Patient will improve R ankle ROM by 7-10 degrees on all planes in order to improve range and overall gait mechancis    Baseline progressing towards goal; Pt continues to be limited with PF, inversion, and eversion AROM   Time 3   Period Weeks   Status On-going   PT SHORT TERM GOAL #2   Title Patient will be able to verballly describe the importance of proper, supportive footwear that is sized correctly, as well as how to size it correctly,  in order to reduce pain and improve overall mechanics during mobilty    Time 3   Period Weeks   Status Achieved   PT SHORT TERM GOAL #3   Title Patient will be independent in correctly and consistently performing appropraite HEP, to be updated PRN    Time 3   Period Weeks   Status Achieved   PT SHORT TERM GOAL #5   Title --   Time --   Period --   Status --   PT SHORT TERM GOAL #6   Title --   Time --   Period --   Status --           PT  Long Term Goals - 11/12/15 1940    PT LONG TERM GOAL #1   Title Patient will demonstrate significant improvements in gait, including equal step/stride lengths, reduced base of support, in order to enhance mobility and promote overall efficiency of gait    Baseline progressing towards goal   Time 6   Period Weeks   Status On-going   PT LONG TERM GOAL #2   Title Patient will be able to wear regular shoes on a regular basis secondary to improved ankle stabilty and strength, with pain no more than 2/10 and efficient gait mechanics in order to improve comfort of overall mobilty    Baseline progressing towards goal; Pt was able to transition to a normal shoe this date with good tolerance reported.    Time 6   Period Weeks   Status On-going   PT LONG TERM GOAL #3   Title Patient will demonstate 5/5 strength in R ankle in order to enhance overall stability and improve efficiency of mobltiy    Baseline progressing towards goal   Time 6   Period Weeks   Status On-going   PT LONG TERM GOAL #4   Title Patient will demo decreased R ankle edema by 0.5 cm with figure-8 measurement in order to improve comfort with wearing regular shoes and in order to imrpove R ankle mobility.    Baseline --   Time 6   Period Weeks   Status New               Plan - 11/14/15 1657    Clinical Impression Statement PT tx was focused on R ankle/foot ROM ther ex, manual techniques to improve jt mobility and tissue extensibility, and resisted ankle ther ex. Good tolerance reported with added resistance with R ankle inversion/eversion with red thera-band. Tactile cues required to improve muscle activation and proper movement pattern with ankle 4-way. Pt  responded well to PT tx with improved R ankle mobility palpated and reported with less severe pain noted at the end of PT tx, which was rated a 2/10 on a VAS. Pt is making steady progress towards stated PT goals.    Pt will benefit from skilled therapeutic intervention  in order to improve on the following deficits Abnormal gait;Decreased endurance;Improper body mechanics;Decreased strength;Impaired flexibility;Postural dysfunction;Decreased mobility;Difficulty walking;Decreased range of motion;Pain;Decreased coordination   Rehab Potential Good   Clinical Impairments Affecting Rehab Potential hip pain    PT Frequency 2x / week   PT Duration 6 weeks   PT Treatment/Interventions ADLs/Self Care Home Management;Gait training;Neuromuscular re-education;Ultrasound;Stair training;Functional mobility training;Patient/family education;Passive range of motion;Cryotherapy;Therapeutic activities;Electrical Stimulation;Therapeutic exercise;Manual techniques;DME Instruction;Balance training   PT Next Visit Plan Continue with manual therapy techniques to improve R ankle ROM and pain, resisted ankle DF/PF with green TB, resisted ankle inversion/eversion with red thera-band, BAPS board on level 4, and modalities to reduce pain    PT Home Exercise Plan Reviewed HEP with no additions made     Consulted and Agree with Plan of Care Patient        Problem List Patient Active Problem List   Diagnosis Date Noted  . History of colonic polyps   . Flatfoot 06/27/2015  . OA (osteoarthritis) of hip 03/20/2015  . Hyponatremia 10/24/2013  . OA (osteoarthritis) of knee 10/23/2013  . Hepatomegaly 09/01/2012  . Lynch syndrome 09/01/2012  . GERD (gastroesophageal reflux disease) 09/01/2012  . Knee pain 08/12/2011    Garen Lah, PT, DPT  11/14/2015, 5:05 PM  [facility] [address] [place], [place], [address] Phone: [phone]   Fax:  [phone]  Name: TRAVYS URQUIJO MRN: [id number] Date of Birth: 1955/11/27

## 2015-11-19 ENCOUNTER — Ambulatory Visit (HOSPITAL_COMMUNITY): Payer: BC Managed Care – PPO | Admitting: Physical Therapy

## 2015-11-19 DIAGNOSIS — Z7409 Other reduced mobility: Secondary | ICD-10-CM

## 2015-11-19 DIAGNOSIS — M25571 Pain in right ankle and joints of right foot: Secondary | ICD-10-CM

## 2015-11-19 DIAGNOSIS — M25671 Stiffness of right ankle, not elsewhere classified: Secondary | ICD-10-CM

## 2015-11-19 DIAGNOSIS — R531 Weakness: Secondary | ICD-10-CM

## 2015-11-19 DIAGNOSIS — M6281 Muscle weakness (generalized): Secondary | ICD-10-CM

## 2015-11-19 DIAGNOSIS — R262 Difficulty in walking, not elsewhere classified: Secondary | ICD-10-CM

## 2015-11-19 NOTE — Therapy (Signed)
[place], [place], [address] Phone: [phone]   Fax:  [phone]  Physical Therapy Treatment  Patient Details  Name: Craig [place] MRN: [id number] Date of Birth: 1956/04/30 Referring Provider: Dr. Wylene Simmer  Encounter Date: 11/19/2015      PT End of Session - 11/19/15 1632    Visit Number 13   Number of Visits 23   Date for PT Re-Evaluation 12/12/15   Authorization Type BCBS    Authorization Time Period 11/18/15 to 12/30/15   PT Start Time 1608   PT Stop Time 1650   PT Time Calculation (min) 42 min   Activity Tolerance Patient tolerated treatment well   Behavior During Therapy [facility] for tasks assessed/performed      Past Medical History  Diagnosis Date  . High cholesterol   . GERD (gastroesophageal reflux disease)   . Anxiety   . Neuropathy ([place])     both feet  . Arthritis     ra  . Lynch syndrome     has gene that causes cancer  . Tubular adenoma     Past Surgical History  Procedure Laterality Date  . Knee surgery Right     bilat   . Colonoscopy   12/09/01    RMR: Normal normal rectum/ A few scattered left-sided diverticula.  The remainder of the colonic  mucosa appeared normal  . Colonoscopy  12/15/2002    RMR: Normal rectum, scattered left-sided diverticula.  The remainder of the colonic mucosa appeared normal  . Colonoscopy  04/10/2004    RMR: Normal rectum/Sigmoid diverticula/The remainder of the colonic mucosa appeared normal  . Esophagogastroduodenoscopy  12/23/2005    RMR:   Esophagogastric peptic stricture with reflux esophagitis, status post  dilation as described above/ Small hiatal hernia, otherwise normal stomach.  Bulbar erosions otherwise normal D1 and D2  . Colonoscopy  12/23/2005    RMR: Normal rectum, scattered sigmoid diverticula Colonic mucosa appeared normal  . Colonoscopy    01/20/2008    RMR: Distal diminutive rectal polyps, status post cold biopsy removal  otherwise,  normal rectum/ biopsy removal; scattered sigmoid diverticula; and benign-appearing/ulcers about the ileocecal valve, status post biopsy.  Remainder of colonic mucosa and terminal mucosa appeared normal.  . Colonoscopy  05/22/2010    RMR: distal diminutive rectal polyp s/p bx otherwise normal/few scattered pancolonic diverticula  . Nose surgery      cartiledge removal  . Colonoscopy  09/16/2012    Dr.Rourk- normal rectum, few scattered pancolonic diverticulosis, one diminutive polyp in the base of the cecum. 3x3 area of hyper pigmentation i the mid descending segment, this was a flat benign-appearing area o/w the remainder of the colonic mucosa appeared normal. bx= tubular adenoma and benign colonic mucosa  . Eye surgery  as child  . Tympanostomy tube placement Right     early 2014  . Joint replacement  09/30/2010    Right knee  . Total knee arthroplasty Left 10/23/2013    Procedure: LEFT TOTAL KNEE ARTHROPLASTY;  Surgeon: [name], MD;  [facility];  Service: Orthopedics;  Laterality: Left;  . Total hip arthroplasty Left 03/20/2015    Procedure: LEFT TOTAL HIP ARTHROPLASTY ANTERIOR APPROACH;  Surgeon: Gaynelle Arabian, MD;  [facility];  Service: Orthopedics;  Laterality: Left;  . Bunionectomy with hammertoe reconstruction and gastroc slide Right 06/27/2015    Procedure: RIGHT GASTROC RECESSION/POSTERIOR TIBIAL TENOLYSIS/SECOND AND THIRD METATARSAL WEIL OSTEOTOMY AND HAMMERTOE CORRECTION;  Surgeon: Jenny Reichmann  Doran Durand, MD;  Location: [place];  Service: Orthopedics;  Laterality: Right;  . Calcaneal osteotomy Right 06/27/2015    Procedure: CALCANEAL OSTEOTOMY;  Surgeon: Wylene Simmer, MD;  Location: [place];  Service: Orthopedics;  Laterality: Right;  . Colonoscopy with propofol N/A 09/05/2015    Procedure: COLONOSCOPY WITH PROPOFOL;  Surgeon: Daneil Dolin, MD;  [facility];  Service: Endoscopy;  Laterality: N/A;  0830  . Polypectomy  09/05/2015     Procedure: POLYPECTOMY;  Surgeon: Daneil Dolin, MD;  [facility];  Service: Endoscopy;;    There were no vitals filed for this visit.  Visit Diagnosis:  Pain in joint, ankle and foot, right  Difficulty walking due to ankle and foot joint  Ankle stiffness, right  Weakness generalized  Decreased mobility and endurance  Muscle weakness                       [facility] Adult PT Treatment/Exercise - 11/19/15 1623    Manual Therapy   Manual Therapy Joint mobilization;Passive ROM   Manual therapy comments performed separately from all other skilled intervetntions    Joint Mobilization MTP flexion/extension, foot supination/pronation, calcaneal inversion/eversion, talar DF   Passive ROM R ankle PROM in all directions; great toe MTP extension in long sit position    Ankle Exercises: Seated   Ankle Circles/Pumps 15 reps   Towel Crunch Weights (lbs) 25 reps    Towel Inversion/Eversion Other (comment)  10 reps with 3# weight each   Heel Raises 15 reps   BAPS Sitting;15 reps;Other (comment)   Other Seated Ankle Exercises pen coin 1x15, 2 second holds    Ankle Exercises: Supine   T-Band R ankle 4-way x 2 sets of 10 reps with red and green thera-band    Ankle Exercises: Stretches   Plantar Fascia Stretch 30 seconds;4 reps  manual                  PT Short Term Goals - 11/12/15 1938    PT SHORT TERM GOAL #1   Title Patient will improve R ankle ROM by 7-10 degrees on all planes in order to improve range and overall gait mechancis    Baseline progressing towards goal; Pt continues to be limited with PF, inversion, and eversion AROM   Time 3   Period Weeks   Status On-going   PT SHORT TERM GOAL #2   Title Patient will be able to verballly describe the importance of proper, supportive footwear that is sized correctly, as well as how to size it correctly,  in order to reduce pain and improve overall mechanics during mobilty    Time 3   Period Weeks    Status Achieved   PT SHORT TERM GOAL #3   Title Patient will be independent in correctly and consistently performing appropraite HEP, to be updated PRN    Time 3   Period Weeks   Status Achieved   PT SHORT TERM GOAL #5   Title --   Time --   Period --   Status --   PT SHORT TERM GOAL #6   Title --   Time --   Period --   Status --           PT Long Term Goals - 11/12/15 1940    PT LONG TERM GOAL #1   Title Patient will demonstrate significant improvements in gait, including equal step/stride lengths, reduced base  of support, in order to enhance mobility and promote overall efficiency of gait    Baseline progressing towards goal   Time 6   Period Weeks   Status On-going   PT LONG TERM GOAL #2   Title Patient will be able to wear regular shoes on a regular basis secondary to improved ankle stabilty and strength, with pain no more than 2/10 and efficient gait mechanics in order to improve comfort of overall mobilty    Baseline progressing towards goal; Pt was able to transition to a normal shoe this date with good tolerance reported.    Time 6   Period Weeks   Status On-going   PT LONG TERM GOAL #3   Title Patient will demonstate 5/5 strength in R ankle in order to enhance overall stability and improve efficiency of mobltiy    Baseline progressing towards goal   Time 6   Period Weeks   Status On-going   PT LONG TERM GOAL #4   Title Patient will demo decreased R ankle edema by 0.5 cm with figure-8 measurement in order to improve comfort with wearing regular shoes and in order to imrpove R ankle mobility.    Baseline --   Time 6   Period Weeks   Status New               Plan - 11/19/15 1828    Clinical Impression Statement Continued focus on NWB therex for Rt foot/ankle including strengthening, ROM and manual techniques to decrease pain and improve overall mobilty.  Inversion/eversion continues to be weakest with minimal ROM achieved against TBand resisitance.  Pt  with overall reduced pain at end of session following manual techniques, however noted antalgia when leaving.  Pt contnues to wear tennis shoes which have helped with overall comfort.  Encouraged patient to continue HEP for optimal results.    Pt will benefit from skilled therapeutic intervention in order to improve on the following deficits Abnormal gait;Decreased endurance;Improper body mechanics;Decreased strength;Impaired flexibility;Postural dysfunction;Decreased mobility;Difficulty walking;Decreased range of motion;Pain;Decreased coordination   Rehab Potential Good   Clinical Impairments Affecting Rehab Potential hip pain    PT Frequency 2x / week   PT Duration 6 weeks   PT Treatment/Interventions ADLs/Self Care Home Management;Gait training;Neuromuscular re-education;Ultrasound;Stair training;Functional mobility training;Patient/family education;Passive range of motion;Cryotherapy;Therapeutic activities;Electrical Stimulation;Therapeutic exercise;Manual techniques;DME Instruction;Balance training   PT Next Visit Plan Continue with manual therapy techniques to improve R ankle ROM and pain, resisted ankle DF/PF with green TB, resisted ankle inversion/eversion with red thera-band, BAPS board on level 4, and modalities to reduce pain    PT Home Exercise Plan Reviewed HEP with no additions made     Consulted and Agree with Plan of Care Patient        Problem List Patient Active Problem List   Diagnosis Date Noted  . History of colonic polyps   . Flatfoot 06/27/2015  . OA (osteoarthritis) of hip 03/20/2015  . Hyponatremia 10/24/2013  . OA (osteoarthritis) of knee 10/23/2013  . Hepatomegaly 09/01/2012  . Lynch syndrome 09/01/2012  . GERD (gastroesophageal reflux disease) 09/01/2012  . Knee pain 08/12/2011    Teena Irani, PTA/CLT [phone]  11/19/2015, 6:34 PM  [facility] [address] [place], [place], [address] Phone: [phone]    Fax:  [phone]  Name: YARNELL NEWBY MRN: [id number] Date of Birth: [birth year]

## 2015-11-21 ENCOUNTER — Ambulatory Visit (HOSPITAL_COMMUNITY): Payer: BC Managed Care – PPO

## 2015-11-26 ENCOUNTER — Ambulatory Visit (HOSPITAL_COMMUNITY): Payer: BC Managed Care – PPO | Admitting: Physical Therapy

## 2015-11-26 DIAGNOSIS — M6281 Muscle weakness (generalized): Secondary | ICD-10-CM

## 2015-11-26 DIAGNOSIS — M25571 Pain in right ankle and joints of right foot: Secondary | ICD-10-CM

## 2015-11-26 DIAGNOSIS — R531 Weakness: Secondary | ICD-10-CM

## 2015-11-26 DIAGNOSIS — R262 Difficulty in walking, not elsewhere classified: Secondary | ICD-10-CM

## 2015-11-26 DIAGNOSIS — Z7409 Other reduced mobility: Secondary | ICD-10-CM

## 2015-11-26 DIAGNOSIS — M25671 Stiffness of right ankle, not elsewhere classified: Secondary | ICD-10-CM

## 2015-11-26 NOTE — Therapy (Signed)
[place], [place], [address] Phone: [phone]   Fax:  [phone]  Physical Therapy Treatment  Patient Details  Name: Craig Scott MRN: [id number] Date of Birth: 1955-12-22 Referring Provider: Dr. Wylene Simmer  Encounter Date: 11/26/2015      PT End of Session - 11/26/15 1735    Visit Number 14   Number of Visits 23   Date for PT Re-Evaluation 12/12/15   Authorization Type BC[employer]    Authorization Time Period 11/18/15 to 12/30/15   PT Start Time 1608  running late from previous patient    PT Stop Time 1650   PT Time Calculation (min) 42 min   Activity Tolerance Patient tolerated treatment well   Behavior During Therapy [facility] for tasks assessed/performed      Past Medical History  Diagnosis Date  . High cholesterol   . GERD (gastroesophageal reflux disease)   . Anxiety   . Neuropathy ([place])     both feet  . Arthritis     ra  . Lynch syndrome     has gene that causes cancer  . Tubular adenoma     Past Surgical History  Procedure Laterality Date  . Knee surgery Right     bilat   . Colonoscopy   12/09/01    RMR: Normal normal rectum/ A few scattered left-sided diverticula.  The remainder of the colonic  mucosa appeared normal  . Colonoscopy  12/15/2002    RMR: Normal rectum, scattered left-sided diverticula.  The remainder of the colonic mucosa appeared normal  . Colonoscopy  04/10/2004    RMR: Normal rectum/Sigmoid diverticula/The remainder of the colonic mucosa appeared normal  . Esophagogastroduodenoscopy  12/23/2005    RMR:   Esophagogastric peptic stricture with reflux esophagitis, status post  dilation as described above/ Small hiatal hernia, otherwise normal stomach.  Bulbar erosions otherwise normal D1 and D2  . Colonoscopy  12/23/2005    RMR: Normal rectum, scattered sigmoid diverticula Colonic mucosa appeared normal  . Colonoscopy    01/20/2008    RMR: Distal diminutive rectal polyps,  status post cold biopsy removal  otherwise, normal rectum/ biopsy removal; scattered sigmoid diverticula; and benign-appearing/ulcers about the ileocecal valve, status post biopsy.  Remainder of colonic mucosa and terminal mucosa appeared normal.  . Colonoscopy  05/22/2010    RMR: distal diminutive rectal polyp s/p bx otherwise normal/few scattered pancolonic diverticula  . Nose surgery      cartiledge removal  . Colonoscopy  09/16/2012    Dr.Rourk- normal rectum, few scattered pancolonic diverticulosis, one diminutive polyp in the base of the cecum. 3x3 area of hyper pigmentation i the mid descending segment, this was a flat benign-appearing area o/w the remainder of the colonic mucosa appeared normal. bx= tubular adenoma and benign colonic mucosa  . Eye surgery  as child  . Tympanostomy tube placement Right     early 2014  . Joint replacement  09/30/2010    Right knee  . Total knee arthroplasty Left 10/23/2013    Procedure: LEFT TOTAL KNEE ARTHROPLASTY;  Surgeon: [name], MD;  Location: [facility];  Service: Orthopedics;  Laterality: Left;  . Total hip arthroplasty Left 03/20/2015    Procedure: LEFT TOTAL HIP ARTHROPLASTY ANTERIOR APPROACH;  Surgeon: Gaynelle Arabian, MD;  Location: [facility];  Service: Orthopedics;  Laterality: Left;  . Bunionectomy with hammertoe reconstruction and gastroc slide Right 06/27/2015    Procedure: RIGHT GASTROC RECESSION/POSTERIOR TIBIAL TENOLYSIS/SECOND AND THIRD METATARSAL WEIL  OSTEOTOMY AND HAMMERTOE CORRECTION;  Surgeon: Wylene Simmer, MD;  Location: [facility];  Service: Orthopedics;  Laterality: Right;  . Calcaneal osteotomy Right 06/27/2015    Procedure: CALCANEAL OSTEOTOMY;  Surgeon: Wylene Simmer, MD;  Location: [place];  Service: Orthopedics;  Laterality: Right;  . Colonoscopy with propofol N/A 09/05/2015    Procedure: COLONOSCOPY WITH PROPOFOL;  Surgeon: Daneil Dolin, MD;  [facility];  Service: Endoscopy;  Laterality:  N/A;  0830  . Polypectomy  09/05/2015    Procedure: POLYPECTOMY;  Surgeon: Daneil Dolin, MD;  [facility];  Service: Endoscopy;;    There were no vitals filed for this visit.  Visit Diagnosis:  Pain in joint, ankle and foot, right  Difficulty walking due to ankle and foot joint  Ankle stiffness, right  Weakness generalized  Decreased mobility and endurance  Muscle weakness      Subjective Assessment - 11/26/15 1608    Subjective Patient reports that he feels like he is staying about the same, has to put his foot up at night and puts ice on it; does them about 3 times a day. Nothing has gotten easier for hiim to do. It is still difficult for him to put all of his weight on his foot, it is still very sensitive.    Pertinent History Patient had extensive surgery on R foot including toes, heel, and arch; did not have therapy after this surgery. Has been working for the past three weeks with boot on; MD took him out of boot almost a week ago. Had a R hip replacement in July but cannot remember his approach/precautions.    How long can you stand comfortably? 3/14- patient reports that he is able to stand perhaps an hour    How long can you walk comfortably? 3/14- 60 minutse    Patient Stated Goals be able to wear regular shoes, walk normally    Currently in Pain? Yes   Pain Score 5    Pain Location Foot   Pain Orientation Right   Pain Descriptors / Indicators Sharp;Numbness;Tingling   Pain Type Chronic pain;Surgical pain   Pain Radiating Towards none    Pain Onset More than a month ago   Pain Frequency Intermittent   Aggravating Factors  weight bearing, touching surfaces with his bare skin    Pain Relieving Factors elevating and putting ice on    Effect of Pain on Daily Activities can't walk a long distance, still has a hard time walking over grass and gravel                          [facility] Adult PT Treatment/Exercise - 11/26/15 0001    Manual Therapy    Manual Therapy Joint mobilization;Passive ROM   Manual therapy comments performed separately from all other skilled intervetntions    Joint Mobilization MTP flexion/extension, foot supination/pronation, calcaneal inversion/eversion, talar DF   Ankle Exercises: Stretches   Plantar Fascia Stretch 30 seconds;4 reps   Slant Board Stretch 3 reps;30 seconds   Ankle Exercises: Seated   Ankle Circles/Pumps 20 reps   BAPS Sitting;15 reps;Other (comment)   Ankle Exercises: Standing   SLS x4 each side    Heel Raises 15 reps  cues for form    Toe Raise 15 reps  cues for form    Other Standing Ankle Exercises rocker board 2 minutes latearl, 2 minute AP U HHA  PT Education - 11/26/15 1732    Education provided No          PT Short Term Goals - 11/12/15 1938    PT SHORT TERM GOAL #1   Title Patient will improve R ankle ROM by 7-10 degrees on all planes in order to improve range and overall gait mechancis    Baseline progressing towards goal; Pt continues to be limited with PF, inversion, and eversion AROM   Time 3   Period Weeks   Status On-going   PT SHORT TERM GOAL #2   Title Patient will be able to verballly describe the importance of proper, supportive footwear that is sized correctly, as well as how to size it correctly,  in order to reduce pain and improve overall mechanics during mobilty    Time 3   Period Weeks   Status Achieved   PT SHORT TERM GOAL #3   Title Patient will be independent in correctly and consistently performing appropraite HEP, to be updated PRN    Time 3   Period Weeks   Status Achieved   PT SHORT TERM GOAL #5   Title --   Time --   Period --   Status --   PT SHORT TERM GOAL #6   Title --   Time --   Period --   Status --           PT Long Term Goals - 11/12/15 1940    PT LONG TERM GOAL #1   Title Patient will demonstrate significant improvements in gait, including equal step/stride lengths, reduced base of support, in  order to enhance mobility and promote overall efficiency of gait    Baseline progressing towards goal   Time 6   Period Weeks   Status On-going   PT LONG TERM GOAL #2   Title Patient will be able to wear regular shoes on a regular basis secondary to improved ankle stabilty and strength, with pain no more than 2/10 and efficient gait mechanics in order to improve comfort of overall mobilty    Baseline progressing towards goal; Pt was able to transition to a normal shoe this date with good tolerance reported.    Time 6   Period Weeks   Status On-going   PT LONG TERM GOAL #3   Title Patient will demonstate 5/5 strength in R ankle in order to enhance overall stability and improve efficiency of mobltiy    Baseline progressing towards goal   Time 6   Period Weeks   Status On-going   PT LONG TERM GOAL #4   Title Patient will demo decreased R ankle edema by 0.5 cm with figure-8 measurement in order to improve comfort with wearing regular shoes and in order to imrpove R ankle mobility.    Baseline --   Time 6   Period Weeks   Status New               Plan - 11/26/15 1735    Clinical Impression Statement Contineud functional mobility and stretching for R foot/ankle complex as well as standing rockerboard and some light closed chain exercises for ankle today; patient reported most relief from session from manaul to R foot, whcih was performed towards beginning of session separately from all otehr skilled interventions. Pain reduced from 5/10 to 3/10 at end of session however continue to note giat impairments and antalgic gait pattern at end of ssesion.    Pt will benefit from skilled therapeutic intervention in  order to improve on the following deficits Abnormal gait;Decreased endurance;Improper body mechanics;Decreased strength;Impaired flexibility;Postural dysfunction;Decreased mobility;Difficulty walking;Decreased range of motion;Pain;Decreased coordination   Rehab Potential Good    Clinical Impairments Affecting Rehab Potential hip pain    PT Frequency 2x / week   PT Duration 6 weeks   PT Treatment/Interventions ADLs/Self Care Home Management;Gait training;Neuromuscular re-education;Ultrasound;Stair training;Functional mobility training;Patient/family education;Passive range of motion;Cryotherapy;Therapeutic activities;Electrical Stimulation;Therapeutic exercise;Manual techniques;DME Instruction;Balance training   PT Next Visit Plan Continue with manual therapy techniques to improve R ankle ROM and pain, resisted ankle DF/PF with green TB, resisted ankle inversion/eversion with red thera-band, BAPS board on level 4, and modalities to reduce pain    PT Home Exercise Plan Reviewed HEP with no additions made     Consulted and Agree with Plan of Care Patient        Problem List Patient Active Problem List   Diagnosis Date Noted  . History of colonic polyps   . Flatfoot 06/27/2015  . OA (osteoarthritis) of hip 03/20/2015  . Hyponatremia 10/24/2013  . OA (osteoarthritis) of knee 10/23/2013  . Hepatomegaly 09/01/2012  . Lynch syndrome 09/01/2012  . GERD (gastroesophageal reflux disease) 09/01/2012  . Knee pain 08/12/2011    Deniece Ree PT, DPT [phone]  [facility] [address] [place], [place], [address] Phone: [phone]   Fax:  [phone]  Name: Craig Scott MRN: [id number] Date of Birth: [birth year]

## 2015-11-28 ENCOUNTER — Ambulatory Visit (HOSPITAL_COMMUNITY): Payer: BC Managed Care – PPO | Admitting: Physical Therapy

## 2015-11-28 DIAGNOSIS — R262 Difficulty in walking, not elsewhere classified: Secondary | ICD-10-CM

## 2015-11-28 DIAGNOSIS — M6281 Muscle weakness (generalized): Secondary | ICD-10-CM

## 2015-11-28 DIAGNOSIS — M25671 Stiffness of right ankle, not elsewhere classified: Secondary | ICD-10-CM

## 2015-11-28 DIAGNOSIS — M25571 Pain in right ankle and joints of right foot: Secondary | ICD-10-CM

## 2015-11-28 DIAGNOSIS — R531 Weakness: Secondary | ICD-10-CM

## 2015-11-28 NOTE — Therapy (Signed)
[place], [place], [address] Phone: [phone]   Fax:  [phone]  Physical Therapy Treatment  Patient Details  Name: Craig Scott MRN: [id number] Date of Birth: 19-Nov-1955 Referring Provider: Dr. Wylene Simmer  Encounter Date: 11/28/2015      PT End of Session - 11/28/15 1609    Visit Number 15   Number of Visits 23   Date for PT Re-Evaluation 12/12/15   Authorization Type BCBS    Authorization Time Period 11/18/15 to 12/30/15   PT Start Time 1600   PT Stop Time 1645   PT Time Calculation (min) 45 min   Activity Tolerance Patient tolerated treatment well   Behavior During Therapy [facility] for tasks assessed/performed      Past Medical History  Diagnosis Date  . High cholesterol   . GERD (gastroesophageal reflux disease)   . Anxiety   . Neuropathy ([place])     both feet  . Arthritis     ra  . Lynch syndrome     has gene that causes cancer  . Tubular adenoma     Past Surgical History  Procedure Laterality Date  . Knee surgery Right     bilat   . Colonoscopy   12/09/01    RMR: Normal normal rectum/ A few scattered left-sided diverticula.  The remainder of the colonic  mucosa appeared normal  . Colonoscopy  12/15/2002    RMR: Normal rectum, scattered left-sided diverticula.  The remainder of the colonic mucosa appeared normal  . Colonoscopy  04/10/2004    RMR: Normal rectum/Sigmoid diverticula/The remainder of the colonic mucosa appeared normal  . Esophagogastroduodenoscopy  12/23/2005    RMR:   Esophagogastric peptic stricture with reflux esophagitis, status post  dilation as described above/ Small hiatal hernia, otherwise normal stomach.  Bulbar erosions otherwise normal D1 and D2  . Colonoscopy  12/23/2005    RMR: Normal rectum, scattered sigmoid diverticula Colonic mucosa appeared normal  . Colonoscopy    01/20/2008    RMR: Distal diminutive rectal polyps, status post cold biopsy removal  otherwise,  normal rectum/ biopsy removal; scattered sigmoid diverticula; and benign-appearing/ulcers about the ileocecal valve, status post biopsy.  Remainder of colonic mucosa and terminal mucosa appeared normal.  . Colonoscopy  05/22/2010    RMR: distal diminutive rectal polyp s/p bx otherwise normal/few scattered pancolonic diverticula  . Nose surgery      cartiledge removal  . Colonoscopy  09/16/2012    Dr.Rourk- normal rectum, few scattered pancolonic diverticulosis, one diminutive polyp in the base of the cecum. 3x3 area of hyper pigmentation i the mid descending segment, this was a flat benign-appearing area o/w the remainder of the colonic mucosa appeared normal. bx= tubular adenoma and benign colonic mucosa  . Eye surgery  as child  . Tympanostomy tube placement Right     early 2014  . Joint replacement  09/30/2010    Right knee  . Total knee arthroplasty Left 10/23/2013    Procedure: LEFT TOTAL KNEE ARTHROPLASTY;  Surgeon: [name], MD;  [facility];  Service: Orthopedics;  Laterality: Left;  . Total hip arthroplasty Left 03/20/2015    Procedure: LEFT TOTAL HIP ARTHROPLASTY ANTERIOR APPROACH;  Surgeon: Gaynelle Arabian, MD;  [facility];  Service: Orthopedics;  Laterality: Left;  . Bunionectomy with hammertoe reconstruction and gastroc slide Right 06/27/2015    Procedure: RIGHT GASTROC RECESSION/POSTERIOR TIBIAL TENOLYSIS/SECOND AND THIRD METATARSAL WEIL OSTEOTOMY AND HAMMERTOE CORRECTION;  Surgeon: Jenny Reichmann  Doran Durand, MD;  Location: [facility];  Service: Orthopedics;  Laterality: Right;  . Calcaneal osteotomy Right 06/27/2015    Procedure: CALCANEAL OSTEOTOMY;  Surgeon: Wylene Simmer, MD;  Location: [facility];  Service: Orthopedics;  Laterality: Right;  . Colonoscopy with propofol N/A 09/05/2015    Procedure: COLONOSCOPY WITH PROPOFOL;  Surgeon: Daneil Dolin, MD;  [facility];  Service: Endoscopy;  Laterality: N/A;  0830  . Polypectomy  09/05/2015     Procedure: POLYPECTOMY;  Surgeon: Daneil Dolin, MD;  [facility];  Service: Endoscopy;;    There were no vitals filed for this visit.  Visit Diagnosis:  Pain in joint, ankle and foot, right  Difficulty walking due to ankle and foot joint  Ankle stiffness, right  Weakness generalized  Muscle weakness      Subjective Assessment - 11/28/15 1604    Subjective Pt states he's been wearing his tennis shoes for 2 days and feels it is better.  STates his foot started hurting 1.5 hours ago, so that is an improvement.  Currently 4/10   Currently in Pain? Yes   Pain Score 4    Pain Location Foot   Pain Orientation Right   Pain Descriptors / Indicators Tingling   Pain Type Chronic pain                         [facility] Adult PT Treatment/Exercise - 11/28/15 0001    Manual Therapy   Manual Therapy Joint mobilization;Passive ROM   Manual therapy comments performed separately from all other skilled intervetntions    Joint Mobilization MTP flexion/extension, foot supination/pronation, calcaneal inversion/eversion, talar DF   Ankle Exercises: Stretches   Plantar Fascia Stretch 30 seconds;2 reps   Slant Board Stretch 3 reps;30 seconds   Ankle Exercises: Standing   BAPS Level 2;Standing;10 reps   SLS x4 each side   Lt: 10", Rt: 3"   Heel Raises 15 reps   Toe Raise 15 reps   Other Standing Ankle Exercises rocker board 2 minutes latearl, 2 minute AP U HHA                   PT Short Term Goals - 11/12/15 1938    PT SHORT TERM GOAL #1   Title Patient will improve R ankle ROM by 7-10 degrees on all planes in order to improve range and overall gait mechancis    Baseline progressing towards goal; Pt continues to be limited with PF, inversion, and eversion AROM   Time 3   Period Weeks   Status On-going   PT SHORT TERM GOAL #2   Title Patient will be able to verballly describe the importance of proper, supportive footwear that is sized correctly, as well  as how to size it correctly,  in order to reduce pain and improve overall mechanics during mobilty    Time 3   Period Weeks   Status Achieved   PT SHORT TERM GOAL #3   Title Patient will be independent in correctly and consistently performing appropraite HEP, to be updated PRN    Time 3   Period Weeks   Status Achieved   PT SHORT TERM GOAL #5   Title --   Time --   Period --   Status --   PT SHORT TERM GOAL #6   Title --   Time --   Period --   Status --  PT Long Term Goals - 11/12/15 1940    PT LONG TERM GOAL #1   Title Patient will demonstrate significant improvements in gait, including equal step/stride lengths, reduced base of support, in order to enhance mobility and promote overall efficiency of gait    Baseline progressing towards goal   Time 6   Period Weeks   Status On-going   PT LONG TERM GOAL #2   Title Patient will be able to wear regular shoes on a regular basis secondary to improved ankle stabilty and strength, with pain no more than 2/10 and efficient gait mechanics in order to improve comfort of overall mobilty    Baseline progressing towards goal; Pt was able to transition to a normal shoe this date with good tolerance reported.    Time 6   Period Weeks   Status On-going   PT LONG TERM GOAL #3   Title Patient will demonstate 5/5 strength in R ankle in order to enhance overall stability and improve efficiency of mobltiy    Baseline progressing towards goal   Time 6   Period Weeks   Status On-going   PT LONG TERM GOAL #4   Title Patient will demo decreased R ankle edema by 0.5 cm with figure-8 measurement in order to improve comfort with wearing regular shoes and in order to imrpove R ankle mobility.    Baseline --   Time 6   Period Weeks   Status New               Plan - 11/28/15 1708    Clinical Impression Statement Pt with overall improved symptoms.  No antalgia noted upon inital ambulation and througout course of treatment.  PT  believes the tennis shoes are helping to reduce his pain.  Also noted improved mobility in foot today.  Progressed to standing BAPS today without pain, most difficulty going into eversion.    Pt will benefit from skilled therapeutic intervention in order to improve on the following deficits Abnormal gait;Decreased endurance;Improper body mechanics;Decreased strength;Impaired flexibility;Postural dysfunction;Decreased mobility;Difficulty walking;Decreased range of motion;Pain;Decreased coordination   Rehab Potential Good   Clinical Impairments Affecting Rehab Potential hip pain    PT Frequency 2x / week   PT Duration 6 weeks   PT Treatment/Interventions ADLs/Self Care Home Management;Gait training;Neuromuscular re-education;Ultrasound;Stair training;Functional mobility training;Patient/family education;Passive range of motion;Cryotherapy;Therapeutic activities;Electrical Stimulation;Therapeutic exercise;Manual techniques;DME Instruction;Balance training   PT Next Visit Plan Continue with manual therapy techniques to improve R ankle ROM and pain, resisted ankle DF/PF with green TB, resisted ankle inversion/eversion with red thera-band, BAPS board on level 4, and modalities to reduce pain    PT Home Exercise Plan Reviewed HEP with no additions made     Consulted and Agree with Plan of Care Patient        Problem List Patient Active Problem List   Diagnosis Date Noted  . History of colonic polyps   . Flatfoot 06/27/2015  . OA (osteoarthritis) of hip 03/20/2015  . Hyponatremia 10/24/2013  . OA (osteoarthritis) of knee 10/23/2013  . Hepatomegaly 09/01/2012  . Lynch syndrome 09/01/2012  . GERD (gastroesophageal reflux disease) 09/01/2012  . Knee pain 08/12/2011    Teena Irani, PTA/CLT [phone]  11/28/2015, 5:12 PM  [facility] [address] [place], [place], [address] Phone: [phone]   Fax:  [phone]  Name: Craig Scott MRN:  [id number] Date of Birth: [birth year]

## 2015-12-03 ENCOUNTER — Ambulatory Visit (HOSPITAL_COMMUNITY): Payer: BC Managed Care – PPO

## 2015-12-03 DIAGNOSIS — M25671 Stiffness of right ankle, not elsewhere classified: Secondary | ICD-10-CM

## 2015-12-03 DIAGNOSIS — Z7409 Other reduced mobility: Secondary | ICD-10-CM

## 2015-12-03 DIAGNOSIS — M25571 Pain in right ankle and joints of right foot: Secondary | ICD-10-CM

## 2015-12-03 DIAGNOSIS — R262 Difficulty in walking, not elsewhere classified: Secondary | ICD-10-CM

## 2015-12-03 DIAGNOSIS — M6281 Muscle weakness (generalized): Secondary | ICD-10-CM

## 2015-12-03 DIAGNOSIS — R531 Weakness: Secondary | ICD-10-CM

## 2015-12-03 NOTE — Therapy (Signed)
[place], [place], [address] Phone: [phone]   Fax:  [phone]  Physical Therapy Treatment  Patient Details  Name: Craig Scott MRN: [id number] Date of Birth: [birth year] Referring Provider: Dr. Wylene Simmer  Encounter Date: 12/03/2015      PT End of Session - 12/03/15 1631    Visit Number 16   Number of Visits 23   Date for PT Re-Evaluation 12/12/15   Authorization Type BC[employer]    Authorization Time Period 11/18/15 to 12/30/15   PT Start Time 1603   PT Stop Time 1645   PT Time Calculation (min) 42 min   Activity Tolerance Patient tolerated treatment well   Behavior During Therapy [facility] for tasks assessed/performed      Past Medical History  Diagnosis Date  . High cholesterol   . GERD (gastroesophageal reflux disease)   . Anxiety   . Neuropathy ([place])     both feet  . Arthritis     ra  . Lynch syndrome     has gene that causes cancer  . Tubular adenoma     Past Surgical History  Procedure Laterality Date  . Knee surgery Right     bilat   . Colonoscopy   12/09/01    RMR: Normal normal rectum/ A few scattered left-sided diverticula.  The remainder of the colonic  mucosa appeared normal  . Colonoscopy  12/15/2002    RMR: Normal rectum, scattered left-sided diverticula.  The remainder of the colonic mucosa appeared normal  . Colonoscopy  04/10/2004    RMR: Normal rectum/Sigmoid diverticula/The remainder of the colonic mucosa appeared normal  . Esophagogastroduodenoscopy  12/23/2005    RMR:   Esophagogastric peptic stricture with reflux esophagitis, status post  dilation as described above/ Small hiatal hernia, otherwise normal stomach.  Bulbar erosions otherwise normal D1 and D2  . Colonoscopy  12/23/2005    RMR: Normal rectum, scattered sigmoid diverticula Colonic mucosa appeared normal  . Colonoscopy    01/20/2008    RMR: Distal diminutive rectal polyps, status post cold biopsy removal  otherwise,  normal rectum/ biopsy removal; scattered sigmoid diverticula; and benign-appearing/ulcers about the ileocecal valve, status post biopsy.  Remainder of colonic mucosa and terminal mucosa appeared normal.  . Colonoscopy  05/22/2010    RMR: distal diminutive rectal polyp s/p bx otherwise normal/few scattered pancolonic diverticula  . Nose surgery      cartiledge removal  . Colonoscopy  09/16/2012    Dr.Rourk- normal rectum, few scattered pancolonic diverticulosis, one diminutive polyp in the base of the cecum. 3x3 area of hyper pigmentation i the mid descending segment, this was a flat benign-appearing area o/w the remainder of the colonic mucosa appeared normal. bx= tubular adenoma and benign colonic mucosa  . Eye surgery  as child  . Tympanostomy tube placement Right     early 2014  . Joint replacement  09/30/2010    Right knee  . Total knee arthroplasty Left 10/23/2013    Procedure: LEFT TOTAL KNEE ARTHROPLASTY;  Surgeon: [name], MD;  Location: [facility];  Service: Orthopedics;  Laterality: Left;  . Total hip arthroplasty Left 03/20/2015    Procedure: LEFT TOTAL HIP ARTHROPLASTY ANTERIOR APPROACH;  Surgeon: Gaynelle Arabian, MD;  Location: [facility];  Service: Orthopedics;  Laterality: Left;  . Bunionectomy with hammertoe reconstruction and gastroc slide Right 06/27/2015    Procedure: RIGHT GASTROC RECESSION/POSTERIOR TIBIAL TENOLYSIS/SECOND AND THIRD METATARSAL WEIL OSTEOTOMY AND HAMMERTOE CORRECTION;  Surgeon: Jenny Reichmann  Doran Durand, MD;  Location: [place];  Service: Orthopedics;  Laterality: Right;  . Calcaneal osteotomy Right 06/27/2015    Procedure: CALCANEAL OSTEOTOMY;  Surgeon: Wylene Simmer, MD;  Location: [place];  Service: Orthopedics;  Laterality: Right;  . Colonoscopy with propofol N/A 09/05/2015    Procedure: COLONOSCOPY WITH PROPOFOL;  Surgeon: Daneil Dolin, MD;  [facility];  Service: Endoscopy;  Laterality: N/A;  0830  . Polypectomy  09/05/2015     Procedure: POLYPECTOMY;  Surgeon: Daneil Dolin, MD;  [facility];  Service: Endoscopy;;    There were no vitals filed for this visit.  Visit Diagnosis:  Pain in joint, ankle and foot, right  Difficulty walking due to ankle and foot joint  Ankle stiffness, right  Weakness generalized  Muscle weakness  Decreased mobility and endurance      Subjective Assessment - 12/03/15 1607    Subjective Pt reported "my foot is getting better" with less severe R foot pain noted within the last 2 weeks. Pt further reported that he is wearing his sneakers all the time rather than his boots and as a result, he has less pain.Pt reports continued compliance with R LE elevation and use of ice.    Pertinent History Patient had extensive surgery on R foot including toes, heel, and arch; did not have therapy after this surgery. Has been working for the past three weeks with boot on; MD took him out of boot almost a week ago. Had a R hip replacement in July but cannot remember his approach/precautions.    Limitations Standing;Walking   Patient Stated Goals be able to wear regular shoes, walk normally    Currently in Pain? Yes   Pain Score 4   R foot/ankle pain has ranged 2-6/10 on a VAS   Pain Location Foot   Pain Orientation Right   Pain Radiating Towards none    Pain Onset More than a month ago   Pain Frequency Intermittent   Aggravating Factors  WB activities   Pain Relieving Factors elevating and use of ice    Effect of Pain on Daily Activities difficulty with long distance ambulation and prolonged standing    Multiple Pain Sites No                         [facility] Adult PT Treatment/Exercise - 12/03/15 0001    Manual Therapy   Manual Therapy Joint mobilization;Passive ROM   Manual therapy comments performed separately from all other skilled intervetntions    Joint Mobilization MTP flexion/extension, foot supination/pronation, calcaneal inversion/eversion, talar DF    Passive ROM R ankle PROM in all directions; great toe MTP extension in long sit position    Ankle Exercises: Stretches   Plantar Fascia Stretch 30 seconds;Other (comment);4 reps  manual stretch   Gastroc Stretch 30 seconds;Other (comment);4 reps  manual stretch   Ankle Exercises: Standing   Heel Raises 15 reps;Other (comment)  with UE support    Toe Raise 15 reps;Other (comment)  with UE support    Other Standing Ankle Exercises rocker board 2 minutes latearl, 2 minute AP U HHA    Ankle Exercises: Seated   Towel Inversion/Eversion Other (comment)  10 reps with 3# weight each   BAPS Sitting;15 reps;Other (comment)  Rocker board; R ankle inv/ev and PF/DF   Ankle Exercises: Supine   T-Band R ankle 4-way x 2 sets of 10 reps with green thera-band  Ankle Exercises: Aerobic   Stationary Bike Nustep x 7 minutes at the beginning of PT tx on level 1; LE only                 PT Education - 12/03/15 1630    Education provided No          PT Short Term Goals - 11/12/15 1938    PT SHORT TERM GOAL #1   Title Patient will improve R ankle ROM by 7-10 degrees on all planes in order to improve range and overall gait mechancis    Baseline progressing towards goal; Pt continues to be limited with PF, inversion, and eversion AROM   Time 3   Period Weeks   Status On-going   PT SHORT TERM GOAL #2   Title Patient will be able to verballly describe the importance of proper, supportive footwear that is sized correctly, as well as how to size it correctly,  in order to reduce pain and improve overall mechanics during mobilty    Time 3   Period Weeks   Status Achieved   PT SHORT TERM GOAL #3   Title Patient will be independent in correctly and consistently performing appropraite HEP, to be updated PRN    Time 3   Period Weeks   Status Achieved           PT Long Term Goals - 11/12/15 1940    PT LONG TERM GOAL #1   Title Patient will demonstrate significant improvements in gait,  including equal step/stride lengths, reduced base of support, in order to enhance mobility and promote overall efficiency of gait    Baseline progressing towards goal   Time 6   Period Weeks   Status On-going   PT LONG TERM GOAL #2   Title Patient will be able to wear regular shoes on a regular basis secondary to improved ankle stabilty and strength, with pain no more than 2/10 and efficient gait mechanics in order to improve comfort of overall mobilty    Baseline progressing towards goal; Pt was able to transition to a normal shoe this date with good tolerance reported.    Time 6   Period Weeks   Status On-going   PT LONG TERM GOAL #3   Title Patient will demonstate 5/5 strength in R ankle in order to enhance overall stability and improve efficiency of mobltiy    Baseline progressing towards goal   Time 6   Period Weeks   Status On-going   PT LONG TERM GOAL #4   Title Patient will demo decreased R ankle edema by 0.5 cm with figure-8 measurement in order to improve comfort with wearing regular shoes and in order to imrpove R ankle mobility.    Baseline --   Time 6   Period Weeks   Status New               Plan - 12/03/15 1632    Clinical Impression Statement Initiated PT tx with warm-up on Nustep with use of LE only in order to improve ankle ROM and strength. Progressed R ankle resisted inversion/eversion from red to green thera-band with good tolerance reported and assessed. Improved muscle activation of R peroneal brevis/longus muscles assessed during ankle 4-way and weighted towel eversion with 3#. Minor R foot edema assessed during today's PT visit. Therapist educated pt to continue with ice and elevation, especially at the end of his day in order to manage pain and edema. Pt verbalized full understanding  and agreement. Completed R ankle/foot PROM and manual techniques with LE elevated in order to reduce edema. Pt is making slow, but steady progress towards stated goals with  less severe R foot/ankle pain. Continue with current POC and progress with standing ankle stabilization/strengthening ther ex as tolerated.    Pt will benefit from skilled therapeutic intervention in order to improve on the following deficits Abnormal gait;Decreased endurance;Improper body mechanics;Decreased strength;Impaired flexibility;Postural dysfunction;Decreased mobility;Difficulty walking;Decreased range of motion;Pain;Decreased coordination   Rehab Potential Good   Clinical Impairments Affecting Rehab Potential hip pain    PT Frequency 2x / week   PT Duration 6 weeks   PT Treatment/Interventions ADLs/Self Care Home Management;Gait training;Neuromuscular re-education;Ultrasound;Stair training;Functional mobility training;Patient/family education;Passive range of motion;Cryotherapy;Therapeutic activities;Electrical Stimulation;Therapeutic exercise;Manual techniques;DME Instruction;Balance training   PT Next Visit Plan Continue with manual therapy techniques to improve R ankle ROM and pain, resisted ankle 4-way with green TB, BAPS board on level 4, and modalities to reduce pain    PT Home Exercise Plan Reviewed HEP with no additions made     Consulted and Agree with Plan of Care Patient        Problem List Patient Active Problem List   Diagnosis Date Noted  . History of colonic polyps   . Flatfoot 06/27/2015  . OA (osteoarthritis) of hip 03/20/2015  . Hyponatremia 10/24/2013  . OA (osteoarthritis) of knee 10/23/2013  . Hepatomegaly 09/01/2012  . Lynch syndrome 09/01/2012  . GERD (gastroesophageal reflux disease) 09/01/2012  . Knee pain 08/12/2011    Garen Lah, PT, DPT   12/03/2015, 8:03 PM  [facility] [address] [place], [place], [address] Phone: [phone]   Fax:  [phone]  Name: Craig Scott MRN: [id number] Date of Birth: [birth year]

## 2015-12-05 ENCOUNTER — Ambulatory Visit (HOSPITAL_COMMUNITY): Payer: BC Managed Care – PPO | Admitting: Physical Therapy

## 2015-12-05 DIAGNOSIS — Z7409 Other reduced mobility: Secondary | ICD-10-CM

## 2015-12-05 DIAGNOSIS — M25571 Pain in right ankle and joints of right foot: Secondary | ICD-10-CM | POA: Diagnosis not present

## 2015-12-05 DIAGNOSIS — R531 Weakness: Secondary | ICD-10-CM

## 2015-12-05 DIAGNOSIS — M25671 Stiffness of right ankle, not elsewhere classified: Secondary | ICD-10-CM

## 2015-12-05 DIAGNOSIS — M6281 Muscle weakness (generalized): Secondary | ICD-10-CM

## 2015-12-05 DIAGNOSIS — R262 Difficulty in walking, not elsewhere classified: Secondary | ICD-10-CM

## 2015-12-05 NOTE — Therapy (Signed)
[place], [place], [address] Phone: [phone]   Fax:  [phone]  Physical Therapy Treatment  Patient Details  Name: Craig Scott MRN: [id number] Date of Birth: 13-Apr-1956 Referring Provider: Dr. Wylene Simmer  Encounter Date: 12/05/2015      PT End of Session - 12/05/15 1724    Visit Number 17   Number of Visits 23   Date for PT Re-Evaluation 12/12/15   Authorization Type BCBS    Authorization Time Period 11/18/15 to 12/30/15   PT Start Time 1610  patient arrived at 4:03, but on Nustep independently for 7 minutes    PT Stop Time 1642   PT Time Calculation (min) 32 min   Activity Tolerance Patient tolerated treatment well   Behavior During Therapy [facility] for tasks assessed/performed      Past Medical History  Diagnosis Date  . High cholesterol   . GERD (gastroesophageal reflux disease)   . Anxiety   . Neuropathy ([place])     both feet  . Arthritis     ra  . Lynch syndrome     has gene that causes cancer  . Tubular adenoma     Past Surgical History  Procedure Laterality Date  . Knee surgery Right     bilat   . Colonoscopy   12/09/01    RMR: Normal normal rectum/ A few scattered left-sided diverticula.  The remainder of the colonic  mucosa appeared normal  . Colonoscopy  12/15/2002    RMR: Normal rectum, scattered left-sided diverticula.  The remainder of the colonic mucosa appeared normal  . Colonoscopy  04/10/2004    RMR: Normal rectum/Sigmoid diverticula/The remainder of the colonic mucosa appeared normal  . Esophagogastroduodenoscopy  12/23/2005    RMR:   Esophagogastric peptic stricture with reflux esophagitis, status post  dilation as described above/ Small hiatal hernia, otherwise normal stomach.  Bulbar erosions otherwise normal D1 and D2  . Colonoscopy  12/23/2005    RMR: Normal rectum, scattered sigmoid diverticula Colonic mucosa appeared normal  . Colonoscopy    01/20/2008    RMR: Distal  diminutive rectal polyps, status post cold biopsy removal  otherwise, normal rectum/ biopsy removal; scattered sigmoid diverticula; and benign-appearing/ulcers about the ileocecal valve, status post biopsy.  Remainder of colonic mucosa and terminal mucosa appeared normal.  . Colonoscopy  05/22/2010    RMR: distal diminutive rectal polyp s/p bx otherwise normal/few scattered pancolonic diverticula  . Nose surgery      cartiledge removal  . Colonoscopy  09/16/2012    Dr.Rourk- normal rectum, few scattered pancolonic diverticulosis, one diminutive polyp in the base of the cecum. 3x3 area of hyper pigmentation i the mid descending segment, this was a flat benign-appearing area o/w the remainder of the colonic mucosa appeared normal. bx= tubular adenoma and benign colonic mucosa  . Eye surgery  as child  . Tympanostomy tube placement Right     early 2014  . Joint replacement  09/30/2010    Right knee  . Total knee arthroplasty Left 10/23/2013    Procedure: LEFT TOTAL KNEE ARTHROPLASTY;  Surgeon: [name], MD;  [facility];  Service: Orthopedics;  Laterality: Left;  . Total hip arthroplasty Left 03/20/2015    Procedure: LEFT TOTAL HIP ARTHROPLASTY ANTERIOR APPROACH;  Surgeon: Gaynelle Arabian, MD;  [facility];  Service: Orthopedics;  Laterality: Left;  . Bunionectomy with hammertoe reconstruction and gastroc slide Right 06/27/2015    Procedure: RIGHT GASTROC RECESSION/POSTERIOR  TIBIAL TENOLYSIS/SECOND AND THIRD METATARSAL WEIL OSTEOTOMY AND HAMMERTOE CORRECTION;  Surgeon: Wylene Simmer, MD;  Location: [place];  Service: Orthopedics;  Laterality: Right;  . Calcaneal osteotomy Right 06/27/2015    Procedure: CALCANEAL OSTEOTOMY;  Surgeon: Wylene Simmer, MD;  Location: [place];  Service: Orthopedics;  Laterality: Right;  . Colonoscopy with propofol N/A 09/05/2015    Procedure: COLONOSCOPY WITH PROPOFOL;  Surgeon: Daneil Dolin, MD;  [facility];   Service: Endoscopy;  Laterality: N/A;  0830  . Polypectomy  09/05/2015    Procedure: POLYPECTOMY;  Surgeon: Daneil Dolin, MD;  [facility];  Service: Endoscopy;;    There were no vitals filed for this visit.  Visit Diagnosis:  Pain in joint, ankle and foot, right  Difficulty walking due to ankle and foot joint  Ankle stiffness, right  Weakness generalized  Muscle weakness  Decreased mobility and endurance      Subjective Assessment - 12/05/15 1613    Subjective Patient reports that he had a big setback, reports that he was walking barefoot to the bathroom and back and had severe foot pain for most of the day; reports he has been hobbling around and is frustrated that he won't be able to wear flip flops anymore.    Pertinent History Patient had extensive surgery on R foot including toes, heel, and arch; did not have therapy after this surgery. Has been working for the past three weeks with boot on; MD took him out of boot almost a week ago. Had a R hip replacement in July but cannot remember his approach/precautions.    Currently in Pain? Yes   Pain Score 4    Pain Location Foot   Pain Orientation Right   Pain Descriptors / Indicators Sharp;Shooting;Sore;Stabbing   Pain Type Chronic pain   Pain Radiating Towards none    Pain Onset More than a month ago   Pain Frequency Intermittent   Aggravating Factors  WB activities    Pain Relieving Factors elevating and use of ice    Effect of Pain on Daily Activities difficulty with long distance ambulation and prolonged standing                         [facility] Adult PT Treatment/Exercise - 12/05/15 0001    Ambulation/Gait   Gait Comments gait training focusing on heel-toe pattern 2x232ft, cues for form    Manual Therapy   Manual Therapy Joint mobilization;Passive ROM   Manual therapy comments performed separately from all other skilled intervetntions    Joint Mobilization MTP flexion/extension, foot  supination/pronation, calcaneal inversion/eversion, talar DF   Ankle Exercises: Stretches   Plantar Fascia Stretch 30 seconds;Other (comment);4 reps   Gastroc Stretch 30 seconds;Other (comment);4 reps   Ankle Exercises: Supine   T-Band R ankle 4-way x 2 sets of 15 reps with green thera-band    Ankle Exercises: Standing   Other Standing Ankle Exercises rocker board 2 minutes latearl, 2 minute AP U HHA      Nustep level 1 x7 minutes today as warmup, unsupervised and not included in billing             PT Education - 12/05/15 1723    Education provided No          PT Short Term Goals - 11/12/15 1938    PT SHORT TERM GOAL #1   Title Patient will improve R ankle ROM by 7-10 degrees  on all planes in order to improve range and overall gait mechancis    Baseline progressing towards goal; Pt continues to be limited with PF, inversion, and eversion AROM   Time 3   Period Weeks   Status On-going   PT SHORT TERM GOAL #2   Title Patient will be able to verballly describe the importance of proper, supportive footwear that is sized correctly, as well as how to size it correctly,  in order to reduce pain and improve overall mechanics during mobilty    Time 3   Period Weeks   Status Achieved   PT SHORT TERM GOAL #3   Title Patient will be independent in correctly and consistently performing appropraite HEP, to be updated PRN    Time 3   Period Weeks   Status Achieved   PT SHORT TERM GOAL #5   Title --   Time --   Period --   Status --   PT SHORT TERM GOAL #6   Title --   Time --   Period --   Status --           PT Long Term Goals - 11/12/15 1940    PT LONG TERM GOAL #1   Title Patient will demonstrate significant improvements in gait, including equal step/stride lengths, reduced base of support, in order to enhance mobility and promote overall efficiency of gait    Baseline progressing towards goal   Time 6   Period Weeks   Status On-going   PT LONG TERM GOAL #2    Title Patient will be able to wear regular shoes on a regular basis secondary to improved ankle stabilty and strength, with pain no more than 2/10 and efficient gait mechanics in order to improve comfort of overall mobilty    Baseline progressing towards goal; Pt was able to transition to a normal shoe this date with good tolerance reported.    Time 6   Period Weeks   Status On-going   PT LONG TERM GOAL #3   Title Patient will demonstate 5/5 strength in R ankle in order to enhance overall stability and improve efficiency of mobltiy    Baseline progressing towards goal   Time 6   Period Weeks   Status On-going   PT LONG TERM GOAL #4   Title Patient will demo decreased R ankle edema by 0.5 cm with figure-8 measurement in order to improve comfort with wearing regular shoes and in order to imrpove R ankle mobility.    Baseline --   Time 6   Period Weeks   Status New               Plan - 12/05/15 1728    Clinical Impression Statement Patient arrived today reporting significant exacerbation of symptoms today, reports the only thing he can think of that increased his pain to such a severe extent is that he went barefoot for awhile to get to the bathroom this morning. Began session with unsupervised exercise on Nustep and progressed to functional stretching ; attempted  4 way ankle in supine however patient unable to efficiently perform inversion/eversion movement, compensating with hip and complaining of severe pain during exercise. Terminated therband exercise today and focused on mobilization of foot due to patient's complaints of pain and tightness of foot/ankle; mobilizations performed grade 2  and progressed to cautious grade 3 as pain allowed. Patient reported improved symptoms after mobilization. Followed mobilization immediately with rockerboard and gait training  today with  cues for form and heel-toe.     Pt will benefit from skilled therapeutic intervention in order to improve on the  following deficits Abnormal gait;Decreased endurance;Improper body mechanics;Decreased strength;Impaired flexibility;Postural dysfunction;Decreased mobility;Difficulty walking;Decreased range of motion;Pain;Decreased coordination   Rehab Potential Good   Clinical Impairments Affecting Rehab Potential hip pain    PT Frequency 2x / week   PT Duration 6 weeks   PT Treatment/Interventions ADLs/Self Care Home Management;Gait training;Neuromuscular re-education;Ultrasound;Stair training;Functional mobility training;Patient/family education;Passive range of motion;Cryotherapy;Therapeutic activities;Electrical Stimulation;Therapeutic exercise;Manual techniques;DME Instruction;Balance training   PT Next Visit Plan Continue with manual therapy techniques to improve R ankle ROM and pain, resisted ankle 4-way with green TB, BAPS board on level 4, and modalities to reduce pain    PT Home Exercise Plan Reviewed HEP with no additions made     Consulted and Agree with Plan of Care Patient        Problem List Patient Active Problem List   Diagnosis Date Noted  . History of colonic polyps   . Flatfoot 06/27/2015  . OA (osteoarthritis) of hip 03/20/2015  . Hyponatremia 10/24/2013  . OA (osteoarthritis) of knee 10/23/2013  . Hepatomegaly 09/01/2012  . Lynch syndrome 09/01/2012  . GERD (gastroesophageal reflux disease) 09/01/2012  . Knee pain 08/12/2011    Deniece Ree PT, DPT [phone]  [facility] [address] [place], [place], [address] Phone: [phone]   Fax:  [phone]  Name: Craig Scott MRN: [id number] Date of Birth: 08/18/1956

## 2015-12-10 ENCOUNTER — Ambulatory Visit (HOSPITAL_COMMUNITY): Payer: BC Managed Care – PPO

## 2015-12-10 DIAGNOSIS — M25571 Pain in right ankle and joints of right foot: Secondary | ICD-10-CM

## 2015-12-10 DIAGNOSIS — M6281 Muscle weakness (generalized): Secondary | ICD-10-CM

## 2015-12-10 DIAGNOSIS — Z7409 Other reduced mobility: Secondary | ICD-10-CM

## 2015-12-10 DIAGNOSIS — R531 Weakness: Secondary | ICD-10-CM

## 2015-12-10 DIAGNOSIS — M25671 Stiffness of right ankle, not elsewhere classified: Secondary | ICD-10-CM

## 2015-12-10 DIAGNOSIS — R262 Difficulty in walking, not elsewhere classified: Secondary | ICD-10-CM

## 2015-12-10 NOTE — Therapy (Signed)
[facility] [place], [place], [address] Phone: [phone]   Fax:  [phone]  Physical Therapy Treatment  Patient Details  Name: Craig Scott MRN: [id number] Date of Birth: [birth year] Referring Provider: Dr. Wylene Simmer  Encounter Date: 12/10/2015      PT End of Session - 12/10/15 1900    Visit Number 18   Number of Visits 23   Date for PT Re-Evaluation 12/12/15   Authorization Type [employer]    Authorization Time Period 11/18/15 to 12/30/15   PT Start Time 1603   PT Stop Time 1650   PT Time Calculation (min) 47 min   Activity Tolerance Patient tolerated treatment well   Behavior During Therapy [facility] for tasks assessed/performed      Past Medical History  Diagnosis Date  . High cholesterol   . GERD (gastroesophageal reflux disease)   . Anxiety   . Neuropathy ([place])     both feet  . Arthritis     ra  . Lynch syndrome     has gene that causes cancer  . Tubular adenoma     Past Surgical History  Procedure Laterality Date  . Knee surgery Right     bilat   . Colonoscopy   12/09/01    RMR: Normal normal rectum/ A few scattered left-sided diverticula.  The remainder of the colonic  mucosa appeared normal  . Colonoscopy  12/15/2002    RMR: Normal rectum, scattered left-sided diverticula.  The remainder of the colonic mucosa appeared normal  . Colonoscopy  04/10/2004    RMR: Normal rectum/Sigmoid diverticula/The remainder of the colonic mucosa appeared normal  . Esophagogastroduodenoscopy  12/23/2005    RMR:   Esophagogastric peptic stricture with reflux esophagitis, status post  dilation as described above/ Small hiatal hernia, otherwise normal stomach.  Bulbar erosions otherwise normal D1 and D2  . Colonoscopy  12/23/2005    RMR: Normal rectum, scattered sigmoid diverticula Colonic mucosa appeared normal  . Colonoscopy    01/20/2008    RMR: Distal diminutive rectal polyps, status post cold biopsy removal  otherwise,  normal rectum/ biopsy removal; scattered sigmoid diverticula; and benign-appearing/ulcers about the ileocecal valve, status post biopsy.  Remainder of colonic mucosa and terminal mucosa appeared normal.  . Colonoscopy  05/22/2010    RMR: distal diminutive rectal polyp s/p bx otherwise normal/few scattered pancolonic diverticula  . Nose surgery      cartiledge removal  . Colonoscopy  09/16/2012    Dr.Rourk- normal rectum, few scattered pancolonic diverticulosis, one diminutive polyp in the base of the cecum. 3x3 area of hyper pigmentation i the mid descending segment, this was a flat benign-appearing area o/w the remainder of the colonic mucosa appeared normal. bx= tubular adenoma and benign colonic mucosa  . Eye surgery  as child  . Tympanostomy tube placement Right     early 2014  . Joint replacement  09/30/2010    Right knee  . Total knee arthroplasty Left 10/23/2013    Procedure: LEFT TOTAL KNEE ARTHROPLASTY;  Surgeon: [name], MD;  Location: [facility];  Service: Orthopedics;  Laterality: Left;  . Total hip arthroplasty Left 03/20/2015    Procedure: LEFT TOTAL HIP ARTHROPLASTY ANTERIOR APPROACH;  Surgeon: Gaynelle Arabian, MD;  Location: [facility];  Service: Orthopedics;  Laterality: Left;  . Bunionectomy with hammertoe reconstruction and gastroc slide Right 06/27/2015    Procedure: RIGHT GASTROC RECESSION/POSTERIOR TIBIAL TENOLYSIS/SECOND AND THIRD METATARSAL WEIL OSTEOTOMY AND HAMMERTOE CORRECTION;  Surgeon: Jenny Reichmann  Doran Durand, MD;  Location: [facility];  Service: Orthopedics;  Laterality: Right;  . Calcaneal osteotomy Right 06/27/2015    Procedure: CALCANEAL OSTEOTOMY;  Surgeon: Wylene Simmer, MD;  Location: [facility];  Service: Orthopedics;  Laterality: Right;  . Colonoscopy with propofol N/A 09/05/2015    Procedure: COLONOSCOPY WITH PROPOFOL;  Surgeon: Daneil Dolin, MD;  Location: [facility];  Service: Endoscopy;  Laterality: N/A;  0830  . Polypectomy  09/05/2015     Procedure: POLYPECTOMY;  Surgeon: Daneil Dolin, MD;  Location: [facility];  Service: Endoscopy;;    There were no vitals filed for this visit.  Visit Diagnosis:  Pain in joint, ankle and foot, right  Difficulty walking due to ankle and foot joint  Ankle stiffness, right  Weakness generalized  Muscle weakness  Decreased mobility and endurance      Subjective Assessment - 12/10/15 1618    Subjective R ankle/foot pain was rated a 1-2/10 on a VAS upon arrival. Pt noted " my foot feels good today", which he attributes to not working today.    Pertinent History Patient had extensive surgery on R foot including toes, heel, and arch; did not have therapy after this surgery. Has been working for the past three weeks with boot on; MD took him out of boot almost a week ago. Had a R hip replacement in July but cannot remember his approach/precautions.    Limitations Standing;Walking   Patient Stated Goals be able to wear regular shoes, reduce pain, and walk normally    Currently in Pain? Yes   Pain Score 2    Pain Location Foot   Pain Orientation Right   Pain Descriptors / Indicators Sore   Pain Type Chronic pain   Pain Radiating Towards None   Pain Onset More than a month ago   Pain Frequency Intermittent   Aggravating Factors  WB activities and long distance ambulation on uneven terrain    Pain Relieving Factors ice and elevation of the R foot    Effect of Pain on Daily Activities difficulty with long distance ambulation and prolonged standing with work related activities    Multiple Pain Sites No                         [facility] Adult PT Treatment/Exercise - 12/10/15 0001    Manual Therapy   Manual Therapy Joint mobilization;Passive ROM;Soft tissue mobilization   Manual therapy comments performed separately from all other skilled intervetntions    Joint Mobilization MTP flexion/extension, foot supination/pronation, calcaneal inversion/eversion, talar DF   Soft  tissue mobilization retrograde massage of R foot and ankle in elevated position    Passive ROM R ankle PROM in all directions; great toe MTP extension in long sit position    Ankle Exercises: Stretches   Plantar Fascia Stretch 30 seconds;Other (comment);4 reps  manual stretch   Gastroc Stretch 30 seconds;Other (comment);4 reps  manual stretch   Ankle Exercises: Supine   T-Band R ankle 4-way x 2 sets of 10 reps with green thera-band    Ankle Exercises: Standing   Heel Raises 15 reps;Other (comment)  with UE support    Other Standing Ankle Exercises forward step-ups on 4" step x 1 set of 10 reps with B UE support;   Forward step-ups on BOSU x 1 set 10 reps with B UE support    Other Standing Ankle Exercises rocker board 2 minutes medial <>lateral, 2 minute  A<>P with unilateral UE support    Ankle Exercises: Aerobic   Stationary Bike Nustep x 7 minutes at the beginning of PT tx on level 1-4; LE only                 PT Education - 12/10/15 1859    Education provided Yes   Education Details proper shoe wear and complainc with HEP    Person(s) Educated Patient   Methods Explanation   Comprehension Verbalized understanding          PT Short Term Goals - 11/12/15 1938    PT SHORT TERM GOAL #1   Title Patient will improve R ankle ROM by 7-10 degrees on all planes in order to improve range and overall gait mechancis    Baseline progressing towards goal; Pt continues to be limited with PF, inversion, and eversion AROM   Time 3   Period Weeks   Status On-going   PT SHORT TERM GOAL #2   Title Patient will be able to verballly describe the importance of proper, supportive footwear that is sized correctly, as well as how to size it correctly,  in order to reduce pain and improve overall mechanics during mobilty    Time 3   Period Weeks   Status Achieved   PT SHORT TERM GOAL #3   Title Patient will be independent in correctly and consistently performing appropraite HEP, to be  updated PRN    Time 3   Period Weeks   Status Achieved   PT SHORT TERM GOAL #5   Title --   Time --   Period --   Status --   PT SHORT TERM GOAL #6   Title --   Time --   Period --   Status --           PT Long Term Goals - 11/12/15 1940    PT LONG TERM GOAL #1   Title Patient will demonstrate significant improvements in gait, including equal step/stride lengths, reduced base of support, in order to enhance mobility and promote overall efficiency of gait    Baseline progressing towards goal   Time 6   Period Weeks   Status On-going   PT LONG TERM GOAL #2   Title Patient will be able to wear regular shoes on a regular basis secondary to improved ankle stabilty and strength, with pain no more than 2/10 and efficient gait mechanics in order to improve comfort of overall mobilty    Baseline progressing towards goal; Pt was able to transition to a normal shoe this date with good tolerance reported.    Time 6   Period Weeks   Status On-going   PT LONG TERM GOAL #3   Title Patient will demonstate 5/5 strength in R ankle in order to enhance overall stability and improve efficiency of mobltiy    Baseline progressing towards goal   Time 6   Period Weeks   Status On-going   PT LONG TERM GOAL #4   Title Patient will demo decreased R ankle edema by 0.5 cm with figure-8 measurement in order to improve comfort with wearing regular shoes and in order to imrpove R ankle mobility.    Baseline --   Time 6   Period Weeks   Status New               Plan - 12/10/15 1901    Clinical Impression Statement PT tx was focused on manual therapy techniques to reduce  swelling and improve mobility of the R foot/ankle, resisted R ankle OKC ther ex, and progressed R ankle CKC strengthening/stabilization ther ex. Initiated PT tx with warm-up on the Nustep x 7 minutes with resistance increased to level 4 this visit with good tolerance reported. Less palpable edema assessed this visit of the R  ankle/foot. Pt reported good compliance with ice and elevation of the R foot. Improved R ankle/foot mobility assessed once manual techniques were completed. Pt continues to present with limited R great hallux extension and weakness of intrinsic muscles of the R foot. Progressed standing R ankle stabilization activities with addition of step-ups on 4" step with further progression to BOSU with B UE support. Verbal cues were required to maintain neutral foot alignment during step-ups and to improve eccentric control when returning to resting position. Improved technique assessed once cues given.  Pt tolerated progressed ther ex well without exacerbating R ankle/foot pain. R foot/ankle pain was rated a 2/10 on a VAS at the completion of PT visit. Pt continues to make slow, but steady progress towards stated goals and would benefit from continued skilled PT to address current deficits. Continue with current POC and progress with R ankle/foot stabilization ther ex in standing as tolerated.    Pt will benefit from skilled therapeutic intervention in order to improve on the following deficits Abnormal gait;Decreased endurance;Improper body mechanics;Decreased strength;Impaired flexibility;Postural dysfunction;Decreased mobility;Difficulty walking;Decreased range of motion;Pain;Decreased coordination   Rehab Potential Good   Clinical Impairments Affecting Rehab Potential hip pain    PT Frequency 2x / week   PT Duration 6 weeks   PT Treatment/Interventions ADLs/Self Care Home Management;Gait training;Neuromuscular re-education;Ultrasound;Stair training;Functional mobility training;Patient/family education;Passive range of motion;Cryotherapy;Therapeutic activities;Electrical Stimulation;Therapeutic exercise;Manual techniques;DME Instruction;Balance training   PT Next Visit Plan Continue with manual therapy techniques to improve R ankle ROM and pain, resisted ankle 4-way with green TB,and standing ankle stabilization  ther act/ex.    PT Home Exercise Plan Reviewed HEP with no additions made     Consulted and Agree with Plan of Care Patient        Problem List Patient Active Problem List   Diagnosis Date Noted  . History of colonic polyps   . Flatfoot 06/27/2015  . OA (osteoarthritis) of hip 03/20/2015  . Hyponatremia 10/24/2013  . OA (osteoarthritis) of knee 10/23/2013  . Hepatomegaly 09/01/2012  . Lynch syndrome 09/01/2012  . GERD (gastroesophageal reflux disease) 09/01/2012  . Knee pain 08/12/2011    Garen Lah, PT, DPT   12/10/2015, 7:09 PM  [facility] [address] [place], [place], [address] Phone: [phone]   Fax:  [phone]  Name: Craig Scott MRN: [id number] Date of Birth: 09/27/1955

## 2015-12-12 ENCOUNTER — Ambulatory Visit (HOSPITAL_COMMUNITY): Payer: BC Managed Care – PPO

## 2015-12-12 ENCOUNTER — Encounter (HOSPITAL_COMMUNITY): Payer: Self-pay

## 2015-12-12 DIAGNOSIS — R262 Difficulty in walking, not elsewhere classified: Secondary | ICD-10-CM

## 2015-12-12 DIAGNOSIS — Z7409 Other reduced mobility: Secondary | ICD-10-CM

## 2015-12-12 DIAGNOSIS — M6281 Muscle weakness (generalized): Secondary | ICD-10-CM

## 2015-12-12 DIAGNOSIS — M25571 Pain in right ankle and joints of right foot: Secondary | ICD-10-CM

## 2015-12-12 DIAGNOSIS — R531 Weakness: Secondary | ICD-10-CM

## 2015-12-12 DIAGNOSIS — M25671 Stiffness of right ankle, not elsewhere classified: Secondary | ICD-10-CM

## 2015-12-12 NOTE — Therapy (Signed)
[facility] [address] [place], [place], [address] Phone: [phone]   Fax:  [phone]  Physical Therapy Treatment ( 30-day Progress Note)  Patient Details  Name: Craig Scott MRN: [id number] Date of Birth: 05-Nov-1955 Referring Provider: Dr. Wylene Simmer  Encounter Date: 12/12/2015      PT End of Session - 12/12/15 1610    Visit Number 19   Number of Visits 23   Date for PT Re-Evaluation 12/30/15   Authorization Type [employer]    Authorization Time Period 11/18/15 to 12/30/15   PT Start Time 1602   PT Stop Time 1645   PT Time Calculation (min) 43 min   Activity Tolerance Patient tolerated treatment well   Behavior During Therapy [facility] for tasks assessed/performed      Past Medical History  Diagnosis Date  . High cholesterol   . GERD (gastroesophageal reflux disease)   . Anxiety   . Neuropathy ([place])     both feet  . Arthritis     ra  . Lynch syndrome     has gene that causes cancer  . Tubular adenoma     Past Surgical History  Procedure Laterality Date  . Knee surgery Right     bilat   . Colonoscopy   12/09/01    RMR: Normal normal rectum/ A few scattered left-sided diverticula.  The remainder of the colonic  mucosa appeared normal  . Colonoscopy  12/15/2002    RMR: Normal rectum, scattered left-sided diverticula.  The remainder of the colonic mucosa appeared normal  . Colonoscopy  04/10/2004    RMR: Normal rectum/Sigmoid diverticula/The remainder of the colonic mucosa appeared normal  . Esophagogastroduodenoscopy  12/23/2005    RMR:   Esophagogastric peptic stricture with reflux esophagitis, status post  dilation as described above/ Small hiatal hernia, otherwise normal stomach.  Bulbar erosions otherwise normal D1 and D2  . Colonoscopy  12/23/2005    RMR: Normal rectum, scattered sigmoid diverticula Colonic mucosa appeared normal  . Colonoscopy    01/20/2008    RMR: Distal diminutive rectal polyps, status post cold  biopsy removal  otherwise, normal rectum/ biopsy removal; scattered sigmoid diverticula; and benign-appearing/ulcers about the ileocecal valve, status post biopsy.  Remainder of colonic mucosa and terminal mucosa appeared normal.  . Colonoscopy  05/22/2010    RMR: distal diminutive rectal polyp s/p bx otherwise normal/few scattered pancolonic diverticula  . Nose surgery      cartiledge removal  . Colonoscopy  09/16/2012    Dr.Rourk- normal rectum, few scattered pancolonic diverticulosis, one diminutive polyp in the base of the cecum. 3x3 area of hyper pigmentation i the mid descending segment, this was a flat benign-appearing area o/w the remainder of the colonic mucosa appeared normal. bx= tubular adenoma and benign colonic mucosa  . Eye surgery  as child  . Tympanostomy tube placement Right     early 2014  . Joint replacement  09/30/2010    Right knee  . Total knee arthroplasty Left 10/23/2013    Procedure: LEFT TOTAL KNEE ARTHROPLASTY;  Surgeon: [name], MD;  Location: [facility];  Service: Orthopedics;  Laterality: Left;  . Total hip arthroplasty Left 03/20/2015    Procedure: LEFT TOTAL HIP ARTHROPLASTY ANTERIOR APPROACH;  Surgeon: Gaynelle Arabian, MD;  Location: [facility];  Service: Orthopedics;  Laterality: Left;  . Bunionectomy with hammertoe reconstruction and gastroc slide Right 06/27/2015    Procedure: RIGHT GASTROC RECESSION/POSTERIOR TIBIAL TENOLYSIS/SECOND AND THIRD METATARSAL WEIL OSTEOTOMY AND HAMMERTOE  CORRECTION;  Surgeon: Wylene Simmer, MD;  Location: [place];  Service: Orthopedics;  Laterality: Right;  . Calcaneal osteotomy Right 06/27/2015    Procedure: CALCANEAL OSTEOTOMY;  Surgeon: Wylene Simmer, MD;  Location: [place];  Service: Orthopedics;  Laterality: Right;  . Colonoscopy with propofol N/A 09/05/2015    Procedure: COLONOSCOPY WITH PROPOFOL;  Surgeon: Daneil Dolin, MD;  Location: [facility];  Service: Endoscopy;  Laterality: N/A;  0830  .  Polypectomy  09/05/2015    Procedure: POLYPECTOMY;  Surgeon: Daneil Dolin, MD;  Location: [facility];  Service: Endoscopy;;    There were no vitals filed for this visit.  Visit Diagnosis:  Pain in joint, ankle and foot, right  Difficulty walking due to ankle and foot joint  Ankle stiffness, right  Weakness generalized  Muscle weakness  Decreased mobility and endurance      Subjective Assessment - 12/12/15 1609    Subjective R foot pain was rated a 6/10 on a VAS upon arrival. Pt reported that his R foot pain fluctuates from day to day, but has noticed improvements in regards to his pain levels since his last PT reassessment. He noted that he is able to walk longer distances with less R foot discomfort. Pt reported good compliance with his current HEP. In addition, he reports frequent use of ice to assist with pain management.    Pertinent History Patient had extensive surgery on R foot including toes, heel, and arch; did not have therapy after this surgery. Has been working for the past three weeks with boot on; MD took him out of boot almost a week ago. Had a R hip replacement in July but cannot remember his approach/precautions.    Limitations Standing;Walking   How long can you walk comfortably? 15 minutes at a time    Patient Stated Goals be able to wear regular shoes, reduce pain, and walk normally    Currently in Pain? Yes   Pain Score 6   R foot pain has ranged between 0-9/10 on a VAS since last week; R foot pain usually remains around a 4/10 on a VAS    Pain Location Foot   Pain Orientation Right   Pain Descriptors / Indicators Sore   Pain Type Chronic pain   Pain Onset More than a month ago   Pain Frequency Intermittent   Aggravating Factors  WB activities, R ankle inversion/eversion AROM, and initial steps in the morning    Pain Relieving Factors ice and R LE elevation    Effect of Pain on Daily Activities Difficulty with long distance ambulation and prolonged  standing with work related activities            [facility] PT Assessment - 12/12/15 0001    Assessment   Medical Diagnosis Orthopedic aftercare/posterior tibial tendonitis R    Referring Provider Dr. Wylene Simmer   Onset Date/Surgical Date 06/27/15   Precautions   Precautions None   Precaution Comments total hip replacement last july; extensive surgery R foot    Restrictions   Weight Bearing Restrictions No   Figure 8 Edema   Figure 8 - Right  58.0 cm    Figure 8 - Left  57.0 cm   Posture/Postural Control   Posture Comments Genu varus    AROM   Right Ankle Dorsiflexion 19   Right Ankle Plantar Flexion 44   Right Ankle Inversion 12   Right Ankle Eversion 15   Left Ankle Dorsiflexion  15   Left Ankle Plantar Flexion --  wfl   Strength   Right Hip Flexion 5/5   Right Hip Extension 4+/5   Right Hip ABduction 4+/5   Left Hip Flexion 4/5   Left Hip Extension 4-/5   Left Hip ABduction 4-/5   Right Knee Flexion 5/5   Right Knee Extension 5/5   Left Knee Flexion 5/5   Left Knee Extension 5/5   Right Ankle Dorsiflexion 5/5   Right Ankle Plantar Flexion 3/5   Right Ankle Inversion 4-/5  range limited    Right Ankle Eversion 4/5  range limited    Left Ankle Dorsiflexion 5/5   Left Ankle Plantar Flexion 4/5   Left Ankle Inversion 5/5   Left Ankle Eversion 5/5   Palpation   Palpation comment continues to display rigidity in first/second metatarsal, midfoot, calcaneous, and talus    Ambulation/Gait   Ambulation/Gait Yes   Ambulation/Gait Assistance 7: Independent   Assistive device None   Gait Pattern Step-through pattern;Decreased stance time - right;Decreased arm swing - right;Decreased arm swing - left   Gait Comments decreased R great hallux extension assessed during heel off                     [facility] Adult PT Treatment/Exercise - 12/12/15 0001    Manual Therapy   Manual Therapy Joint mobilization;Passive ROM;Soft tissue mobilization   Manual therapy  comments performed separately from all other skilled intervetntions    Joint Mobilization MTP flexion/extension, foot supination/pronation, calcaneal inversion/eversion, talar DF   Passive ROM R ankle PROM in all directions; great toe MTP extension in long sit position    Ankle Exercises: Stretches   Plantar Fascia Stretch 30 seconds;Other (comment);4 reps  manual stretch   Gastroc Stretch 30 seconds;Other (comment);4 reps  manual stretch   Other Stretch R anterior tibialis muscle stretch 3 sets of 30 sec hold   Ankle Exercises: Supine   T-Band --   Ankle Exercises: Standing   Heel Raises 15 reps;Other (comment)  with UE support    Toe Raise --  with UE support    Other Standing Ankle Exercises rocker board 2 minutes medial <>lateral, 2 minute A<>P with unilateral UE support    Ankle Exercises: Aerobic   Stationary Bike Nustep x 7 minutes at the beginning of PT tx on level 1-4; LE only    Ankle Exercises: Seated   Other Seated Ankle Exercises R foot inversion/eversion/PF/DF on rocker board x 20 reps with 3 sec hold                 PT Education - 12/12/15 1833    Education provided Yes   Education Details Current HEP with focus on daily R ankle ROM and stretching ther ex    Person(s) Educated Patient   Methods Explanation;Demonstration   Comprehension Returned demonstration;Verbalized understanding          PT Short Term Goals - 12/12/15 1822    PT SHORT TERM GOAL #1   Title Patient will improve R ankle ROM by 7-10 degrees on all planes in order to improve range and overall gait mechancis    Baseline progressing towards goal; Pt continues to be limited with PF, inversion, and eversion AROM   Time 3   Status On-going   PT SHORT TERM GOAL #2   Title Patient will be able to verballly describe the importance of proper, supportive footwear that is sized correctly, as well as how to size  it correctly,  in order to reduce pain and improve overall mechanics during mobilty     Time 3   Period Weeks   Status Achieved   PT SHORT TERM GOAL #3   Title Patient will be independent in correctly and consistently performing appropraite HEP, to be updated PRN    Time 3   Period Weeks   Status Achieved           PT Long Term Goals - 12/12/15 1823    PT LONG TERM GOAL #1   Title Patient will demonstrate significant improvements in gait, including equal step/stride lengths, reduced base of support, in order to enhance mobility and promote overall efficiency of gait    Baseline progressing towards goal   Time 6   Period Weeks   Status On-going   PT LONG TERM GOAL #2   Title Patient will be able to wear regular shoes on a regular basis secondary to improved ankle stabilty and strength, with pain no more than 2/10 and efficient gait mechanics in order to improve comfort of overall mobilty    Baseline progressing towards goal;    Time 6   Period Weeks   Status On-going   PT LONG TERM GOAL #3   Title Patient will demonstate 5/5 strength in R ankle in order to enhance overall stability and improve efficiency of mobltiy    Baseline progressing towards goal   Time 6   Period Weeks   Status On-going   PT LONG TERM GOAL #4   Title Patient will demo decreased R ankle edema by 0.5 cm with figure-8 measurement in order to improve comfort with wearing regular shoes and in order to imrpove R ankle mobility.    Baseline No changes assessed in figure-8 edema measurements since last re-eval; however, his edema fluctuates from day to day    Time 6   Period Weeks   Status On-going               Plan - 12/12/15 1610    Clinical Impression Statement Physical therapy 30-day progress note completed today. Mr. Casola is making steady progress towards stated PT goals with improved R ankle/foot strength, less severe/frequent R foot pain, and improved tolerance with WB activities. However, he continues to present with R ankle/foot hypomobility and weakness, which is limiting his  ability to tolerate WB activities for long periods of time. Limited R great hallux extension assessed with PROM assessment this visit, which limits his ability to complete heel off to toe off transition of gait. His gait pattern and quality fluctuates from visit to visit due to his pain levels. R ankle DF AROM is WNL with most limitations palpated/assessed with inversion and plantar flexion. Pt is responding well to manual therapy techniques and AROM ther ex with improved mobility assessed since last PT reassessment. PT tx was focused on R ankle/foot AROM/AAROM ther ex and manual techniques to improve joint mobility and reduce pain. Good tolerance reported with today's PT tx with R foot pain decreased from a 6/10 to 3-4/10 on a VAS. Pt is compliant with pain/edema management strategies and HEP with good understanding demo and verbalized by the pt. Mr. Frickey would continue to benefit from skilled PT per POC to address current impairments of R ankle/foot weakness, decreased R ankle/foot/great hallux mobility and ROM, R foot pain, and edema. Pt is in agreement with continued skilled PT and is happy with his progress thus far.    Pt will benefit from skilled  therapeutic intervention in order to improve on the following deficits Abnormal gait;Decreased endurance;Improper body mechanics;Decreased strength;Impaired flexibility;Postural dysfunction;Decreased mobility;Difficulty walking;Decreased range of motion;Pain;Decreased coordination   Rehab Potential Good   Clinical Impairments Affecting Rehab Potential hip pain    PT Frequency 2x / week   PT Duration 6 weeks   PT Treatment/Interventions ADLs/Self Care Home Management;Gait training;Neuromuscular re-education;Ultrasound;Stair training;Functional mobility training;Patient/family education;Passive range of motion;Cryotherapy;Therapeutic activities;Electrical Stimulation;Therapeutic exercise;Manual techniques;DME Instruction;Balance training   PT Next Visit Plan  Continue with manual therapy techniques to improve R ankle ROM and pain, resisted ankle 4-way with green TB,and standing ankle stabilization ther act/ex.    PT Home Exercise Plan Reviewed HEP with no additions made     Consulted and Agree with Plan of Care Patient        Problem List Patient Active Problem List   Diagnosis Date Noted  . History of colonic polyps   . Flatfoot 06/27/2015  . OA (osteoarthritis) of hip 03/20/2015  . Hyponatremia 10/24/2013  . OA (osteoarthritis) of knee 10/23/2013  . Hepatomegaly 09/01/2012  . Lynch syndrome 09/01/2012  . GERD (gastroesophageal reflux disease) 09/01/2012  . Knee pain 08/12/2011    Garen Lah, PT, DPT   12/12/2015, 6:44 PM  [place] [address] [place], [place], [address] Phone: [phone]   Fax:  [phone]  Name: DEMARIEN SERPE MRN: [id number] Date of Birth: 08/05/1956

## 2015-12-17 ENCOUNTER — Ambulatory Visit (HOSPITAL_COMMUNITY): Payer: BC Managed Care – PPO

## 2015-12-19 ENCOUNTER — Encounter (HOSPITAL_COMMUNITY): Payer: Self-pay

## 2015-12-19 ENCOUNTER — Ambulatory Visit (HOSPITAL_COMMUNITY): Payer: BC Managed Care – PPO | Attending: Orthopedic Surgery

## 2015-12-19 DIAGNOSIS — R262 Difficulty in walking, not elsewhere classified: Secondary | ICD-10-CM | POA: Insufficient documentation

## 2015-12-19 DIAGNOSIS — M25671 Stiffness of right ankle, not elsewhere classified: Secondary | ICD-10-CM | POA: Diagnosis present

## 2015-12-19 DIAGNOSIS — M25571 Pain in right ankle and joints of right foot: Secondary | ICD-10-CM | POA: Diagnosis not present

## 2015-12-19 DIAGNOSIS — M6281 Muscle weakness (generalized): Secondary | ICD-10-CM

## 2015-12-19 NOTE — Therapy (Signed)
[place], [place], [address] Phone: [phone]   Fax:  [phone]  Physical Therapy Treatment/ Reassessment  Patient Details  Name: Craig Scott MRN: [id number] Date of Birth: 06-30-1956 Referring Provider: Dr. Wylene Simmer  Encounter Date: 12/19/2015      PT End of Session - 12/19/15 1615    Visit Number 20   Number of Visits 30   Date for PT Re-Evaluation 01/17/16   Authorization Type [employer]    Authorization Time Period 12/19/2015 to 01/30/2016   PT Start Time 1605   PT Stop Time 1648   PT Time Calculation (min) 43 min   Activity Tolerance Patient tolerated treatment well   Behavior During Therapy [name] for tasks assessed/performed      Past Medical History  Diagnosis Date  . High cholesterol   . GERD (gastroesophageal reflux disease)   . Anxiety   . Neuropathy ([place])     both feet  . Arthritis     ra  . Lynch syndrome     has gene that causes cancer  . Tubular adenoma     Past Surgical History  Procedure Laterality Date  . Knee surgery Right     bilat   . Colonoscopy   12/09/01    RMR: Normal normal rectum/ A few scattered left-sided diverticula.  The remainder of the colonic  mucosa appeared normal  . Colonoscopy  12/15/2002    RMR: Normal rectum, scattered left-sided diverticula.  The remainder of the colonic mucosa appeared normal  . Colonoscopy  04/10/2004    RMR: Normal rectum/Sigmoid diverticula/The remainder of the colonic mucosa appeared normal  . Esophagogastroduodenoscopy  12/23/2005    RMR:   Esophagogastric peptic stricture with reflux esophagitis, status post  dilation as described above/ Small hiatal hernia, otherwise normal stomach.  Bulbar erosions otherwise normal D1 and D2  . Colonoscopy  12/23/2005    RMR: Normal rectum, scattered sigmoid diverticula Colonic mucosa appeared normal  . Colonoscopy    01/20/2008    RMR: Distal diminutive rectal polyps, status post cold biopsy  removal  otherwise, normal rectum/ biopsy removal; scattered sigmoid diverticula; and benign-appearing/ulcers about the ileocecal valve, status post biopsy.  Remainder of colonic mucosa and terminal mucosa appeared normal.  . Colonoscopy  05/22/2010    RMR: distal diminutive rectal polyp s/p bx otherwise normal/few scattered pancolonic diverticula  . Nose surgery      cartiledge removal  . Colonoscopy  09/16/2012    Dr.Rourk- normal rectum, few scattered pancolonic diverticulosis, one diminutive polyp in the base of the cecum. 3x3 area of hyper pigmentation i the mid descending segment, this was a flat benign-appearing area o/w the remainder of the colonic mucosa appeared normal. bx= tubular adenoma and benign colonic mucosa  . Eye surgery  as child  . Tympanostomy tube placement Right     early 2014  . Joint replacement  09/30/2010    Right knee  . Total knee arthroplasty Left 10/23/2013    Procedure: LEFT TOTAL KNEE ARTHROPLASTY;  Surgeon: [name], MD;  Location: [facility];  Service: Orthopedics;  Laterality: Left;  . Total hip arthroplasty Left 03/20/2015    Procedure: LEFT TOTAL HIP ARTHROPLASTY ANTERIOR APPROACH;  Surgeon: Gaynelle Arabian, MD;  Location: [facility];  Service: Orthopedics;  Laterality: Left;  . Bunionectomy with hammertoe reconstruction and gastroc slide Right 06/27/2015    Procedure: RIGHT GASTROC RECESSION/POSTERIOR TIBIAL TENOLYSIS/SECOND AND THIRD METATARSAL WEIL OSTEOTOMY AND HAMMERTOE CORRECTION;  Surgeon:  Wylene Simmer, MD;  Location: [place];  Service: Orthopedics;  Laterality: Right;  . Calcaneal osteotomy Right 06/27/2015    Procedure: CALCANEAL OSTEOTOMY;  Surgeon: Wylene Simmer, MD;  Location: [place];  Service: Orthopedics;  Laterality: Right;  . Colonoscopy with propofol N/A 09/05/2015    Procedure: COLONOSCOPY WITH PROPOFOL;  Surgeon: Daneil Dolin, MD;  Location: [facility];  Service: Endoscopy;  Laterality: N/A;  0830  .  Polypectomy  09/05/2015    Procedure: POLYPECTOMY;  Surgeon: Daneil Dolin, MD;  Location: [facility];  Service: Endoscopy;;    There were no vitals filed for this visit.      Subjective Assessment - 12/19/15 1846    Subjective R foot pain was rated a 3/10 on a VAS upon arrival. Pt reported that his R foot pain "feels better today". Pt reported that overall his pain is less severe and that he is able to ambulate longer distances with less rest breaks. Pt reported that his R foot pain fluctuates from day to day, but has noticed improvements in regards to his pain levels since his last PT reassessment.    Pertinent History Patient had extensive surgery on R foot including toes, heel, and arch; did not have therapy after this surgery. Has been working for the past three weeks with boot on; MD took him out of boot almost a week ago. Had a R hip replacement in July but cannot remember his approach/precautions.    Limitations Standing;Walking   How long can you sit comfortably? Unlimited    How long can you stand comfortably? ~1 hour    How long can you walk comfortably? 15 minutes at a time    Patient Stated Goals be able to wear regular shoes, reduce pain, and walk normally    Currently in Pain? Yes   Pain Score 3    Pain Location Foot   Pain Orientation Right   Pain Descriptors / Indicators Sore   Pain Type Chronic pain   Pain Onset More than a month ago   Pain Frequency Intermittent   Aggravating Factors  WB activities, R ankle inversion/eversion AROM, and initial steps in the morning    Pain Relieving Factors ice and R LE elevation    Effect of Pain on Daily Activities Difficulty with long distance ambulation and prolonged standing with work related activities    Multiple Pain Sites No            [facility] PT Assessment - 12/19/15 0001    Assessment   Medical Diagnosis Orthopedic aftercare/posterior tibial tendonitis R    Referring Provider Dr. Wylene Simmer   Onset Date/Surgical  Date 06/27/15   Precautions   Precautions None   Precaution Comments total hip replacement last july; extensive surgery R foot    Restrictions   Weight Bearing Restrictions No   Figure 8 Edema   Figure 8 - Right  58.0 cm    Figure 8 - Left  57.0 cm   Posture/Postural Control   Posture Comments Genu varus    AROM   Right Ankle Dorsiflexion 19   Right Ankle Plantar Flexion 44   Right Ankle Inversion 12   Right Ankle Eversion 15   Left Ankle Dorsiflexion 15   Left Ankle Plantar Flexion --  wfl   Strength   Right Hip Flexion 5/5   Right Hip Extension 4+/5   Right Hip ABduction 4+/5   Left Hip Flexion 4/5   Left  Hip Extension 4-/5   Left Hip ABduction 4-/5   Right Knee Flexion 5/5   Right Knee Extension 5/5   Left Knee Flexion 5/5   Left Knee Extension 5/5   Right Ankle Dorsiflexion 5/5   Right Ankle Plantar Flexion 3/5   Right Ankle Inversion 4-/5  range limited    Right Ankle Eversion 4/5  range limited    Left Ankle Dorsiflexion 5/5   Left Ankle Plantar Flexion 4/5   Left Ankle Inversion 5/5   Left Ankle Eversion 5/5   Palpation   Palpation comment continues to display rigidity in first/second metatarsal, midfoot, calcaneous, and talus    Ambulation/Gait   Ambulation/Gait Yes   Ambulation/Gait Assistance 7: Independent   Assistive device None   Gait Pattern Step-through pattern;Decreased stance time - right;Decreased arm swing - right;Decreased arm swing - left   Gait Comments decreased R great hallux extension assessed during heel off                     [facility] Adult PT Treatment/Exercise - 12/19/15 0001    Manual Therapy   Manual Therapy Joint mobilization;Passive ROM;Soft tissue mobilization   Manual therapy comments performed separately from all other skilled intervetntions    Joint Mobilization MTP flexion/extension, foot supination/pronation, calcaneal inversion/eversion, talar DF   Passive ROM R ankle PROM in all directions; great toe MTP  extension in long sit position    Ankle Exercises: Stretches   Plantar Fascia Stretch 30 seconds;Other (comment);4 reps  manual stretch   Gastroc Stretch 30 seconds;Other (comment);4 reps  manual stretch   Other Stretch R anterior tibialis muscle stretch 3 sets of 30 sec hold   Ankle Exercises: Standing   Heel Raises 15 reps;Other (comment)  with UE support; 2 sets    Toe Raise --  with UE support    Other Standing Ankle Exercises Step-ups on BOSU x 2 sets of 10 reps with B UE support   Ankle Exercises: Aerobic   Stationary Bike Nustep x 10 minutes at the beginning of PT tx on level 1-4; LE only    Ankle Exercises: Supine   T-Band R ankle inversion/eversion x 2 sets of 10 reps with red thera-band             PT Education - 12/19/15 1858    Education provided Yes   Education Details HEP   Person(s) Educated Patient   Methods Explanation;Demonstration   Comprehension Verbalized understanding;Returned demonstration          PT Short Term Goals - 12/19/15 1901    PT SHORT TERM GOAL #1   Title Patient will improve R ankle ROM by 7-10 degrees on all planes in order to improve range and overall gait mechancis    Baseline progressing towards goal; Pt continues to be limited with PF, inversion, and eversion AROM   Time 3   Period Weeks   Status On-going   PT SHORT TERM GOAL #2   Title Patient will be able to verballly describe the importance of proper, supportive footwear that is sized correctly, as well as how to size it correctly,  in order to reduce pain and improve overall mechanics during mobilty    Time 3   Period Weeks   Status Achieved   PT SHORT TERM GOAL #3   Title Patient will be independent in correctly and consistently performing appropraite HEP, to be updated PRN    Time 3   Period Weeks   Status Achieved  PT SHORT TERM GOAL #4   Time 4           PT Long Term Goals - 12/19/15 1902    PT LONG TERM GOAL #1   Title Patient will demonstrate significant  improvements in gait, including equal step/stride lengths, reduced base of support, in order to enhance mobility and promote overall efficiency of gait    Baseline progressing towards goal   Time 6   Period Weeks   Status On-going   PT LONG TERM GOAL #2   Title Patient will be able to wear regular shoes on a regular basis secondary to improved ankle stabilty and strength, with pain no more than 2/10 and efficient gait mechanics in order to improve comfort of overall mobilty    Baseline progressing towards goal;    Time 6   Period Weeks   Status On-going   PT LONG TERM GOAL #3   Title Patient will demonstate 5/5 strength in R ankle in order to enhance overall stability and improve efficiency of mobltiy    Baseline progressing towards goal   Time 6   Period Weeks   Status On-going   PT LONG TERM GOAL #4   Title Patient will demo decreased R ankle edema by 0.5 cm with figure-8 measurement in order to improve comfort with wearing regular shoes and in order to imrpove R ankle mobility.    Baseline No changes assessed in figure-8 edema measurements since last re-eval; however, his edema fluctuates from day to day    Time 6   Period Weeks   Status On-going               Plan - 12/19/15 1616    Clinical Impression Statement PT tx was focused on R ankle/foot AROM/AAROM ther ex, manual techniques to improve joint mobility and reduce pain, and standing ankle strengthening ther ex. Initiated PT tx on the Nu-step x 10 minutes in order to improve LE conditioning and ankle ROM/endurance. Good tolerance and mobility assessed after manual therapy techniques completed this visit. Completed anterior step-ups on BOSU to improve ankle stabilization. Pt reported excellent tolerance with step-ups and heel raises with pain remaining at baseline level. Good tolerance reported with today's PT tx with R foot pain rated a from 3/10 on a VAS. Physical therapy reassessment completed today. Mr. Gotcher is making  steady progress towards stated PT goals with improved R ankle/foot strength, less severe/frequent R foot pain, and improved tolerance with WB activities. However, he continues to present with R ankle/foot hypomobility and weakness, which is limiting his ability to tolerate WB activities for long periods of time. His gait pattern and quality fluctuates from visit to visit due to his pain levels. R ankle DF AROM is WNL with most limitations palpated/assessed with inversion and plantar flexion. Pt is responding well to manual therapy techniques and AROM ther ex with improved mobility assessed since last PT reassessment. Mr. Naqvi would continue to benefit from skilled PT per POC for an additional 2x/week for 6 weeks in order to address current impairments of R ankle/foot weakness, decreased R ankle/foot/great hallux mobility and ROM, R foot pain, and edema. Pt is in agreement with continued skilled PT and is happy with his progress thus far.     Rehab Potential Good   Clinical Impairments Affecting Rehab Potential hip pain    PT Frequency 2x / week   PT Duration 6 weeks   PT Treatment/Interventions ADLs/Self Care Home Management;Gait training;Neuromuscular re-education;Ultrasound;Stair training;Functional mobility training;Patient/family  education;Passive range of motion;Cryotherapy;Therapeutic activities;Electrical Stimulation;Therapeutic exercise;Manual techniques;DME Instruction;Balance training   PT Next Visit Plan Continue with manual therapy techniques to improve R ankle ROM and pain, heel raises, step-ups on BOSU, and standing ankle stabilization ther act/ex.    PT Home Exercise Plan Reviewed HEP with no additions made     Consulted and Agree with Plan of Care Patient      Patient will benefit from skilled therapeutic intervention in order to improve the following deficits and impairments:  Abnormal gait, Decreased endurance, Improper body mechanics, Decreased strength, Impaired flexibility,  Postural dysfunction, Decreased mobility, Difficulty walking, Decreased range of motion, Pain, Decreased coordination  Visit Diagnosis: Pain in right ankle and joints of right foot - Plan: PT plan of care cert/re-cert  Difficulty in walking, not elsewhere classified - Plan: PT plan of care cert/re-cert  Stiffness of right ankle, not elsewhere classified - Plan: PT plan of care cert/re-cert  Muscle weakness (generalized) - Plan: PT plan of care cert/re-cert     Problem List Patient Active Problem List   Diagnosis Date Noted  . History of colonic polyps   . Flatfoot 06/27/2015  . OA (osteoarthritis) of hip 03/20/2015  . Hyponatremia 10/24/2013  . OA (osteoarthritis) of knee 10/23/2013  . Hepatomegaly 09/01/2012  . Lynch syndrome 09/01/2012  . GERD (gastroesophageal reflux disease) 09/01/2012  . Knee pain 08/12/2011    Garen Lah, PT, DPT   12/19/2015, 7:32 PM  [facility] [address] [place], [place], [address] Phone: [phone]   Fax:  [phone]  Name: DONTAY BURDELL MRN: [id number] Date of Birth: 1955-10-31

## 2015-12-24 ENCOUNTER — Ambulatory Visit (HOSPITAL_COMMUNITY): Payer: BC Managed Care – PPO

## 2015-12-24 ENCOUNTER — Encounter (HOSPITAL_COMMUNITY): Payer: Self-pay

## 2015-12-24 DIAGNOSIS — M25571 Pain in right ankle and joints of right foot: Secondary | ICD-10-CM | POA: Diagnosis not present

## 2015-12-24 DIAGNOSIS — M25671 Stiffness of right ankle, not elsewhere classified: Secondary | ICD-10-CM

## 2015-12-24 DIAGNOSIS — R262 Difficulty in walking, not elsewhere classified: Secondary | ICD-10-CM

## 2015-12-24 DIAGNOSIS — M6281 Muscle weakness (generalized): Secondary | ICD-10-CM

## 2015-12-24 NOTE — Therapy (Signed)
[place], [place], [address] Phone: [phone]   Fax:  [phone]  Physical Therapy Treatment  Patient Details  Name: Craig Scott MRN: [id number] Date of Birth: 11-Apr-1956 Referring Provider: Dr. Wylene Simmer  Encounter Date: 12/24/2015      PT End of Session - 12/24/15 1600    Visit Number 21   Number of Visits 30   Date for PT Re-Evaluation 01/17/16   Authorization Type BC[employer]    Authorization Time Period 12/19/2015 to 01/30/2016   PT Start Time 1601   PT Stop Time 1645   PT Time Calculation (min) 44 min   Activity Tolerance Patient tolerated treatment well   Behavior During Therapy [facility] for tasks assessed/performed      Past Medical History  Diagnosis Date  . High cholesterol   . GERD (gastroesophageal reflux disease)   . Anxiety   . Neuropathy ([place])     both feet  . Arthritis     ra  . Lynch syndrome     has gene that causes cancer  . Tubular adenoma     Past Surgical History  Procedure Laterality Date  . Knee surgery Right     bilat   . Colonoscopy   12/09/01    RMR: Normal normal rectum/ A few scattered left-sided diverticula.  The remainder of the colonic  mucosa appeared normal  . Colonoscopy  12/15/2002    RMR: Normal rectum, scattered left-sided diverticula.  The remainder of the colonic mucosa appeared normal  . Colonoscopy  04/10/2004    RMR: Normal rectum/Sigmoid diverticula/The remainder of the colonic mucosa appeared normal  . Esophagogastroduodenoscopy  12/23/2005    RMR:   Esophagogastric peptic stricture with reflux esophagitis, status post  dilation as described above/ Small hiatal hernia, otherwise normal stomach.  Bulbar erosions otherwise normal D1 and D2  . Colonoscopy  12/23/2005    RMR: Normal rectum, scattered sigmoid diverticula Colonic mucosa appeared normal  . Colonoscopy    01/20/2008    RMR: Distal diminutive rectal polyps, status post cold biopsy removal   otherwise, normal rectum/ biopsy removal; scattered sigmoid diverticula; and benign-appearing/ulcers about the ileocecal valve, status post biopsy.  Remainder of colonic mucosa and terminal mucosa appeared normal.  . Colonoscopy  05/22/2010    RMR: distal diminutive rectal polyp s/p bx otherwise normal/few scattered pancolonic diverticula  . Nose surgery      cartiledge removal  . Colonoscopy  09/16/2012    Dr.Rourk- normal rectum, few scattered pancolonic diverticulosis, one diminutive polyp in the base of the cecum. 3x3 area of hyper pigmentation i the mid descending segment, this was a flat benign-appearing area o/w the remainder of the colonic mucosa appeared normal. bx= tubular adenoma and benign colonic mucosa  . Eye surgery  as child  . Tympanostomy tube placement Right     early 2014  . Joint replacement  09/30/2010    Right knee  . Total knee arthroplasty Left 10/23/2013    Procedure: LEFT TOTAL KNEE ARTHROPLASTY;  Surgeon: [name], MD;  Location: [facility];  Service: Orthopedics;  Laterality: Left;  . Total hip arthroplasty Left 03/20/2015    Procedure: LEFT TOTAL HIP ARTHROPLASTY ANTERIOR APPROACH;  Surgeon: Gaynelle Arabian, MD;  Location: [facility];  Service: Orthopedics;  Laterality: Left;  . Bunionectomy with hammertoe reconstruction and gastroc slide Right 06/27/2015    Procedure: RIGHT GASTROC RECESSION/POSTERIOR TIBIAL TENOLYSIS/SECOND AND THIRD METATARSAL WEIL OSTEOTOMY AND HAMMERTOE CORRECTION;  Surgeon: Jenny Reichmann  Doran Durand, MD;  Location: [facility];  Service: Orthopedics;  Laterality: Right;  . Calcaneal osteotomy Right 06/27/2015    Procedure: CALCANEAL OSTEOTOMY;  Surgeon: Wylene Simmer, MD;  Location: [facility];  Service: Orthopedics;  Laterality: Right;  . Colonoscopy with propofol N/A 09/05/2015    Procedure: COLONOSCOPY WITH PROPOFOL;  Surgeon: Daneil Dolin, MD;  [facility];  Service: Endoscopy;  Laterality: N/A;  0830  . Polypectomy   09/05/2015    Procedure: POLYPECTOMY;  Surgeon: Daneil Dolin, MD;  [facility];  Service: Endoscopy;;    There were no vitals filed for this visit.      Subjective Assessment - 12/24/15 1600    Subjective Patient reported that he had to stand for two hours this morning at work and as a result has increased R ankle/foot pain. He reports that his pain has subsided since this morning. R foot pain was rated a 5/10 on a VAS upon arrival.    Pertinent History Patient had extensive surgery on R foot including toes, heel, and arch;   Limitations Standing;Walking   How long can you sit comfortably? Unlimited    How long can you stand comfortably? ~1 hour    How long can you walk comfortably? 15 minutes at a time    Patient Stated Goals be able to wear regular shoes, reduce pain, and walk normally    Currently in Pain? Yes   Pain Score 5    Pain Location Foot   Pain Orientation Right   Pain Descriptors / Indicators Aching;Sore   Pain Type Chronic pain   Pain Onset More than a month ago   Pain Frequency Intermittent   Aggravating Factors  WB activities, R ankle inversion/eversion AROM, and initial steps in the morning    Pain Relieving Factors ice and R LE elevation    Effect of Pain on Daily Activities Difficulty with long distance ambulation and prolonged standing   Multiple Pain Sites No                         [facility] Adult PT Treatment/Exercise - 12/24/15 0001    Gait Comments decreased R great hallux extension assessed during heel off   Posture/Postural Control   Posture Comments Genu varus    Manual Therapy   Manual Therapy Joint mobilization;Passive ROM;Soft tissue mobilization   Manual therapy comments performed separately from all other skilled intervetntions    Joint Mobilization MTP flexion/extension, foot supination/pronation, calcaneal inversion/eversion, talar DF   Passive ROM R ankle PROM in all directions; great toe MTP extension in long sit  position    Ankle Exercises: Stretches   Plantar Fascia Stretch 30 seconds;Other (comment);4 reps  manual stretch   Slant Board Stretch 3 reps;30 seconds;Other (comment)  R foot    Other Stretch R anterior tibialis muscle stretch 3 sets of 30 sec hold   Ankle Exercises: Standing   Heel Raises 15 reps;Other (comment)  with UE support; 2 sets    Toe Raise --  with UE support    Other Standing Ankle Exercises Step-ups on BOSU x 2 sets of 10 reps with B UE support   Ankle Exercises: Aerobic   Stationary Bike Nustep x 8 minutes at the beginning of PT tx on level 1-4; LE only    Ankle Exercises: Supine   T-Band R ankle inversion/eversion x 2 sets of 10 reps with red thera-band  PT Education - 12/24/15 1641    Education provided Yes   Education Details HEP   Person(s) Educated Patient   Methods Explanation   Comprehension Verbalized understanding          PT Short Term Goals - 12/19/15 1901    PT SHORT TERM GOAL #1   Title Patient will improve R ankle ROM by 7-10 degrees on all planes in order to improve range and overall gait mechancis    Baseline progressing towards goal; Pt continues to be limited with PF, inversion, and eversion AROM   Time 3   Period Weeks   Status On-going   PT SHORT TERM GOAL #2   Title Patient will be able to verballly describe the importance of proper, supportive footwear that is sized correctly, as well as how to size it correctly,  in order to reduce pain and improve overall mechanics during mobilty    Time 3   Period Weeks   Status Achieved   PT SHORT TERM GOAL #3   Title Patient will be independent in correctly and consistently performing appropraite HEP, to be updated PRN    Time 3   Period Weeks   Status Achieved   PT SHORT TERM GOAL #4   Time 4           PT Long Term Goals - 12/19/15 1902    PT LONG TERM GOAL #1   Title Patient will demonstrate significant improvements in gait, including equal step/stride  lengths, reduced base of support, in order to enhance mobility and promote overall efficiency of gait    Baseline progressing towards goal   Time 6   Period Weeks   Status On-going   PT LONG TERM GOAL #2   Title Patient will be able to wear regular shoes on a regular basis secondary to improved ankle stabilty and strength, with pain no more than 2/10 and efficient gait mechanics in order to improve comfort of overall mobilty    Baseline progressing towards goal;    Time 6   Period Weeks   Status On-going   PT LONG TERM GOAL #3   Title Patient will demonstate 5/5 strength in R ankle in order to enhance overall stability and improve efficiency of mobltiy    Baseline progressing towards goal   Time 6   Period Weeks   Status On-going   PT LONG TERM GOAL #4   Title Patient will demo decreased R ankle edema by 0.5 cm with figure-8 measurement in order to improve comfort with wearing regular shoes and in order to imrpove R ankle mobility.    Baseline No changes assessed in figure-8 edema measurements since last re-eval; however, his edema fluctuates from day to day    Time 6   Period Weeks   Status On-going               Plan - 12/24/15 1642    Clinical Impression Statement PT tx was focused on R ankle/foot AROM/AAROM ther ex, manual techniques to improve joint mobility and reduce pain, and standing ankle strengthening ther ex. Gastroc-soleus stretch completed with use of slant board in a standing position this visit. Good tolerance reported with completed ther ex this visit with R foot/ankle pain reduced to a 3-4/10 on a VAS. Instructed the patient to add heel raises to his HEP. Verbal and visual cues provided for proper heel raise technique with good understanding demo. Improved R great hallux extension assessed s/p manual therapy techniques. The patient  is making steady progress towards stated goals and would continue to benefit from skilled PT per POC weeks in order to address current  impairments of R ankle/foot weakness, decreased R ankle/foot/great hallux mobility and ROM, R foot pain, and edema. Pt is in agreement with continued skilled PT and is happy with his progress thus far.      Rehab Potential Good   Clinical Impairments Affecting Rehab Potential hip pain    PT Frequency 2x / week   PT Duration 6 weeks   PT Treatment/Interventions ADLs/Self Care Home Management;Gait training;Neuromuscular re-education;Ultrasound;Stair training;Functional mobility training;Patient/family education;Passive range of motion;Cryotherapy;Therapeutic activities;Electrical Stimulation;Therapeutic exercise;Manual techniques;DME Instruction;Balance training   PT Next Visit Plan Continue with manual therapy techniques to improve R ankle ROM and pain, heel raises, step-ups on BOSU, and standing ankle stabilization ther act/ex.    PT Home Exercise Plan Reviewed HEP with no additions made        Patient will benefit from skilled therapeutic intervention in order to improve the following deficits and impairments:  Abnormal gait, Decreased endurance, Improper body mechanics, Decreased strength, Impaired flexibility, Postural dysfunction, Decreased mobility, Difficulty walking, Decreased range of motion, Pain, Decreased coordination  Visit Diagnosis: Pain in right ankle and joints of right foot  Difficulty in walking, not elsewhere classified  Stiffness of right ankle, not elsewhere classified  Muscle weakness (generalized)     Problem List Patient Active Problem List   Diagnosis Date Noted  . History of colonic polyps   . Flatfoot 06/27/2015  . OA (osteoarthritis) of hip 03/20/2015  . Hyponatremia 10/24/2013  . OA (osteoarthritis) of knee 10/23/2013  . Hepatomegaly 09/01/2012  . Lynch syndrome 09/01/2012  . GERD (gastroesophageal reflux disease) 09/01/2012  . Knee pain 08/12/2011    Garen Lah, PT, DPT   12/24/2015, 4:57 PM  [facility] [address] [place], [place], [address] Phone: [phone]   Fax:  [phone]  Name: LERONE CHAVOYA MRN: [id number] Date of Birth: 1956/02/16

## 2015-12-26 ENCOUNTER — Ambulatory Visit (HOSPITAL_COMMUNITY): Payer: BC Managed Care – PPO

## 2015-12-26 ENCOUNTER — Encounter (HOSPITAL_COMMUNITY): Payer: Self-pay

## 2015-12-26 DIAGNOSIS — R262 Difficulty in walking, not elsewhere classified: Secondary | ICD-10-CM

## 2015-12-26 DIAGNOSIS — M25571 Pain in right ankle and joints of right foot: Secondary | ICD-10-CM | POA: Diagnosis not present

## 2015-12-26 DIAGNOSIS — M6281 Muscle weakness (generalized): Secondary | ICD-10-CM

## 2015-12-26 DIAGNOSIS — M25671 Stiffness of right ankle, not elsewhere classified: Secondary | ICD-10-CM

## 2015-12-26 NOTE — Therapy (Signed)
[place], [place], [address] Phone: [phone]   Fax:  [phone]  Physical Therapy Treatment  Patient Details  Name: Craig Scott MRN: [id number] Date of Birth: 10-14-1955 Referring Provider: Dr. Wylene Simmer  Encounter Date: 12/26/2015      PT End of Session - 12/26/15 1611    Visit Number 22   Number of Visits 30   Date for PT Re-Evaluation 01/17/16   Authorization Type BC[employer]    Authorization Time Period 12/19/2015 to 01/30/2016   PT Start Time 1602   PT Stop Time 1649   PT Time Calculation (min) 47 min   Activity Tolerance Treatment limited secondary to agitation   Behavior During Therapy [facility] for tasks assessed/performed      Past Medical History  Diagnosis Date  . High cholesterol   . GERD (gastroesophageal reflux disease)   . Anxiety   . Neuropathy ([place])     both feet  . Arthritis     ra  . Lynch syndrome     has gene that causes cancer  . Tubular adenoma     Past Surgical History  Procedure Laterality Date  . Knee surgery Right     bilat   . Colonoscopy   12/09/01    RMR: Normal normal rectum/ A few scattered left-sided diverticula.  The remainder of the colonic  mucosa appeared normal  . Colonoscopy  12/15/2002    RMR: Normal rectum, scattered left-sided diverticula.  The remainder of the colonic mucosa appeared normal  . Colonoscopy  04/10/2004    RMR: Normal rectum/Sigmoid diverticula/The remainder of the colonic mucosa appeared normal  . Esophagogastroduodenoscopy  12/23/2005    RMR:   Esophagogastric peptic stricture with reflux esophagitis, status post  dilation as described above/ Small hiatal hernia, otherwise normal stomach.  Bulbar erosions otherwise normal D1 and D2  . Colonoscopy  12/23/2005    RMR: Normal rectum, scattered sigmoid diverticula Colonic mucosa appeared normal  . Colonoscopy    01/20/2008    RMR: Distal diminutive rectal polyps, status post cold biopsy removal   otherwise, normal rectum/ biopsy removal; scattered sigmoid diverticula; and benign-appearing/ulcers about the ileocecal valve, status post biopsy.  Remainder of colonic mucosa and terminal mucosa appeared normal.  . Colonoscopy  05/22/2010    RMR: distal diminutive rectal polyp s/p bx otherwise normal/few scattered pancolonic diverticula  . Nose surgery      cartiledge removal  . Colonoscopy  09/16/2012    Dr.Rourk- normal rectum, few scattered pancolonic diverticulosis, one diminutive polyp in the base of the cecum. 3x3 area of hyper pigmentation i the mid descending segment, this was a flat benign-appearing area o/w the remainder of the colonic mucosa appeared normal. bx= tubular adenoma and benign colonic mucosa  . Eye surgery  as child  . Tympanostomy tube placement Right     early 2014  . Joint replacement  09/30/2010    Right knee  . Total knee arthroplasty Left 10/23/2013    Procedure: LEFT TOTAL KNEE ARTHROPLASTY;  Surgeon: [name], MD;  [facility];  Service: Orthopedics;  Laterality: Left;  . Total hip arthroplasty Left 03/20/2015    Procedure: LEFT TOTAL HIP ARTHROPLASTY ANTERIOR APPROACH;  Surgeon: Gaynelle Arabian, MD;  [facility];  Service: Orthopedics;  Laterality: Left;  . Bunionectomy with hammertoe reconstruction and gastroc slide Right 06/27/2015    Procedure: RIGHT GASTROC RECESSION/POSTERIOR TIBIAL TENOLYSIS/SECOND AND THIRD METATARSAL WEIL OSTEOTOMY AND HAMMERTOE CORRECTION;  Surgeon:  Wylene Simmer, MD;  Location: [facility];  Service: Orthopedics;  Laterality: Right;  . Calcaneal osteotomy Right 06/27/2015    Procedure: CALCANEAL OSTEOTOMY;  Surgeon: Wylene Simmer, MD;  Location: [facility];  Service: Orthopedics;  Laterality: Right;  . Colonoscopy with propofol N/A 09/05/2015    Procedure: COLONOSCOPY WITH PROPOFOL;  Surgeon: Daneil Dolin, MD;  [facility];  Service: Endoscopy;  Laterality: N/A;  0830  . Polypectomy   09/05/2015    Procedure: POLYPECTOMY;  Surgeon: Daneil Dolin, MD;  [facility];  Service: Endoscopy;;    There were no vitals filed for this visit.      Subjective Assessment - 12/26/15 1605    Subjective Patient reported "my foot feels pretty good today". No changes reported since last PT visit.    Pertinent History Patient had extensive surgery on R foot including toes, heel, and arch;   Limitations Standing;Walking   Patient Stated Goals be able to wear regular shoes, reduce pain, and walk normally    Currently in Pain? Yes   Pain Score 5    Pain Location Foot   Pain Orientation Right   Pain Descriptors / Indicators Aching;Sore   Pain Type Chronic pain   Pain Onset More than a month ago   Pain Frequency Intermittent   Aggravating Factors  WB activities, R ankle inversion/eversion AROM to end-range, and initial steps   Pain Relieving Factors ice and R LE elevation   Effect of Pain on Daily Activities Difficulty with long distance ambulation an prolonged standing    Multiple Pain Sites No                         [facility] Adult PT Treatment/Exercise - 12/26/15 0001    Ambulation/Gait   Ambulation/Gait Yes   Ambulation/Gait Assistance 7: Independent   Assistive device None   Gait Pattern Step-through pattern;Decreased stance time - right;Decreased arm swing - right;Decreased arm swing - left   Gait Comments decreased R great hallux extension assessed during heel off   Posture/Postural Control   Posture Comments Genu varus    Manual Therapy   Manual Therapy Joint mobilization;Passive ROM;Soft tissue mobilization   Manual therapy comments performed separately from all other skilled intervetntions    Joint Mobilization MTP flexion/extension, foot supination/pronation, calcaneal inversion/eversion, talar DF   Passive ROM R ankle PROM in all directions; great toe MTP extension in long sit position    Ankle Exercises: Stretches   Plantar Fascia Stretch  30 seconds;Other (comment);4 reps  manual stretch   Slant Board Stretch 3 reps;30 seconds;Other (comment)  R foot    Other Stretch R anterior tibialis muscle stretch 3 sets of 30 sec hold   Ankle Exercises: Standing   Heel Raises 15 reps;Other (comment)  with UE support; 2 sets    Other Standing Ankle Exercises Forward and lateral step-ups on BOSU x 1 set of 10 reps with B UE support   Other Standing Ankle Exercises rocker board 2 minutes medial <>lateral, 2 minute A<>P with unilateral UE support    Ankle Exercises: Aerobic   Stationary Bike Nustep x 7 minutes at the beginning of PT tx on level 1-4; LE only    Ankle Exercises: Supine   T-Band R ankle inversion/eversion x 2 sets of 10 reps with red thera-band                 PT Education - 12/26/15 1610  Education provided Yes   Education Details Current HEP and use of stationary bike at home    Person(s) Educated Patient   Methods Explanation;Demonstration   Comprehension Verbalized understanding;Returned demonstration          PT Short Term Goals - 12/19/15 1901    PT SHORT TERM GOAL #1   Title Patient will improve R ankle ROM by 7-10 degrees on all planes in order to improve range and overall gait mechancis    Baseline progressing towards goal; Pt continues to be limited with PF, inversion, and eversion AROM   Time 3   Period Weeks   Status On-going   PT SHORT TERM GOAL #2   Title Patient will be able to verballly describe the importance of proper, supportive footwear that is sized correctly, as well as how to size it correctly,  in order to reduce pain and improve overall mechanics during mobilty    Time 3   Period Weeks   Status Achieved   PT SHORT TERM GOAL #3   Title Patient will be independent in correctly and consistently performing appropraite HEP, to be updated PRN    Time 3   Period Weeks   Status Achieved   PT SHORT TERM GOAL #4   Time 4           PT Long Term Goals - 12/19/15 1902    PT  LONG TERM GOAL #1   Title Patient will demonstrate significant improvements in gait, including equal step/stride lengths, reduced base of support, in order to enhance mobility and promote overall efficiency of gait    Baseline progressing towards goal   Time 6   Period Weeks   Status On-going   PT LONG TERM GOAL #2   Title Patient will be able to wear regular shoes on a regular basis secondary to improved ankle stabilty and strength, with pain no more than 2/10 and efficient gait mechanics in order to improve comfort of overall mobilty    Baseline progressing towards goal;    Time 6   Period Weeks   Status On-going   PT LONG TERM GOAL #3   Title Patient will demonstate 5/5 strength in R ankle in order to enhance overall stability and improve efficiency of mobltiy    Baseline progressing towards goal   Time 6   Period Weeks   Status On-going   PT LONG TERM GOAL #4   Title Patient will demo decreased R ankle edema by 0.5 cm with figure-8 measurement in order to improve comfort with wearing regular shoes and in order to imrpove R ankle mobility.    Baseline No changes assessed in figure-8 edema measurements since last re-eval; however, his edema fluctuates from day to day    Time 6   Period Weeks   Status On-going            Plan - 12/26/15 1613    Clinical Impression Statement PT tx was focused on R ankle/foot AROM/AAROM ther ex, manual techniques to improve joint mobility and reduce pain, and standing ankle strengthening ther ex. R foot/ankle pain reduced to a 3/10 on a VAS. In addition, patient presented with improved R ankle inversion/eversion AROM and R great hallux extension assessed once manual therapy techniques were completed. Good tolerance reported with completed ther ex/act. The patient is making steady progress towards stated goals and would continue to benefit from skilled PT per POC weeks in order to address current impairments of R ankle/foot weakness, decreased R  ankle/foot/great hallux mobility and ROM, R foot pain, and edema.    Rehab Potential Good   PT Frequency 2x / week   PT Duration 6 weeks   PT Treatment/Interventions ADLs/Self Care Home Management;Gait training;Neuromuscular re-education;Ultrasound;Stair training;Functional mobility training;Patient/family education;Passive range of motion;Cryotherapy;Therapeutic activities;Electrical Stimulation;Therapeutic exercise;Manual techniques;DME Instruction;Balance training   PT Next Visit Plan Continue with manual therapy techniques to improve R ankle ROM and pain, heel raises, step-ups on BOSU, and standing ankle stabilization ther act/ex.    PT Home Exercise Plan Reviewed HEP with no additions made     Consulted and Agree with Plan of Care Patient      Patient will benefit from skilled therapeutic intervention in order to improve the following deficits and impairments:  Abnormal gait, Decreased endurance, Improper body mechanics, Decreased strength, Impaired flexibility, Postural dysfunction, Decreased mobility, Difficulty walking, Decreased range of motion, Pain, Decreased coordination  Visit Diagnosis: Pain in right ankle and joints of right foot  Difficulty in walking, not elsewhere classified  Stiffness of right ankle, not elsewhere classified  Muscle weakness (generalized)     Problem List Patient Active Problem List   Diagnosis Date Noted  . History of colonic polyps   . Flatfoot 06/27/2015  . OA (osteoarthritis) of hip 03/20/2015  . Hyponatremia 10/24/2013  . OA (osteoarthritis) of knee 10/23/2013  . Hepatomegaly 09/01/2012  . Lynch syndrome 09/01/2012  . GERD (gastroesophageal reflux disease) 09/01/2012  . Knee pain 08/12/2011   Garen Lah, PT, DPT  12/26/2015, 4:50 PM  [facility] [address] [place], [place], [address] Phone: [phone]   Fax:  [phone]  Name: Craig Scott MRN: [id number] Date of Birth:  Sep 25, 1955

## 2015-12-31 ENCOUNTER — Ambulatory Visit (HOSPITAL_COMMUNITY): Payer: BC Managed Care – PPO | Admitting: Physical Therapy

## 2015-12-31 DIAGNOSIS — M6281 Muscle weakness (generalized): Secondary | ICD-10-CM

## 2015-12-31 DIAGNOSIS — M25571 Pain in right ankle and joints of right foot: Secondary | ICD-10-CM | POA: Diagnosis not present

## 2015-12-31 DIAGNOSIS — M25671 Stiffness of right ankle, not elsewhere classified: Secondary | ICD-10-CM

## 2015-12-31 DIAGNOSIS — R262 Difficulty in walking, not elsewhere classified: Secondary | ICD-10-CM

## 2015-12-31 NOTE — Therapy (Signed)
[facility], [place], [address] Phone: [phone]   Fax:  [phone]  Physical Therapy Treatment  Patient Details  Name: Craig Scott MRN: [id number] Date of Birth: Aug 09, 1956 Referring Provider: Dr. Wylene Simmer  Encounter Date: 12/31/2015      PT End of Session - 12/31/15 1731    Visit Number 23   Number of Visits 30   Date for PT Re-Evaluation 01/17/16   Authorization Type BC[employer]    Authorization Time Period 12/19/2015 to 01/30/2016   PT Start Time 1608   PT Stop Time 1655   PT Time Calculation (min) 47 min   Activity Tolerance Treatment limited secondary to agitation   Behavior During Therapy [facility] for tasks assessed/performed      Past Medical History  Diagnosis Date  . High cholesterol   . GERD (gastroesophageal reflux disease)   . Anxiety   . Neuropathy ([place])     both feet  . Arthritis     ra  . Lynch syndrome     has gene that causes cancer  . Tubular adenoma     Past Surgical History  Procedure Laterality Date  . Knee surgery Right     bilat   . Colonoscopy   12/09/01    RMR: Normal normal rectum/ A few scattered left-sided diverticula.  The remainder of the colonic  mucosa appeared normal  . Colonoscopy  12/15/2002    RMR: Normal rectum, scattered left-sided diverticula.  The remainder of the colonic mucosa appeared normal  . Colonoscopy  04/10/2004    RMR: Normal rectum/Sigmoid diverticula/The remainder of the colonic mucosa appeared normal  . Esophagogastroduodenoscopy  12/23/2005    RMR:   Esophagogastric peptic stricture with reflux esophagitis, status post  dilation as described above/ Small hiatal hernia, otherwise normal stomach.  Bulbar erosions otherwise normal D1 and D2  . Colonoscopy  12/23/2005    RMR: Normal rectum, scattered sigmoid diverticula Colonic mucosa appeared normal  . Colonoscopy    01/20/2008    RMR: Distal diminutive rectal polyps, status post cold biopsy removal   otherwise, normal rectum/ biopsy removal; scattered sigmoid diverticula; and benign-appearing/ulcers about the ileocecal valve, status post biopsy.  Remainder of colonic mucosa and terminal mucosa appeared normal.  . Colonoscopy  05/22/2010    RMR: distal diminutive rectal polyp s/p bx otherwise normal/few scattered pancolonic diverticula  . Nose surgery      cartiledge removal  . Colonoscopy  09/16/2012    Dr.Rourk- normal rectum, few scattered pancolonic diverticulosis, one diminutive polyp in the base of the cecum. 3x3 area of hyper pigmentation i the mid descending segment, this was a flat benign-appearing area o/w the remainder of the colonic mucosa appeared normal. bx= tubular adenoma and benign colonic mucosa  . Eye surgery  as child  . Tympanostomy tube placement Right     early 2014  . Joint replacement  09/30/2010    Right knee  . Total knee arthroplasty Left 10/23/2013    Procedure: LEFT TOTAL KNEE ARTHROPLASTY;  Surgeon: [name], MD;  Location: [facility];  Service: Orthopedics;  Laterality: Left;  . Total hip arthroplasty Left 03/20/2015    Procedure: LEFT TOTAL HIP ARTHROPLASTY ANTERIOR APPROACH;  Surgeon: Gaynelle Arabian, MD;  Location: [facility];  Service: Orthopedics;  Laterality: Left;  . Bunionectomy with hammertoe reconstruction and gastroc slide Right 06/27/2015    Procedure: RIGHT GASTROC RECESSION/POSTERIOR TIBIAL TENOLYSIS/SECOND AND THIRD METATARSAL WEIL OSTEOTOMY AND HAMMERTOE CORRECTION;  Surgeon:  Wylene Simmer, MD;  Location: [place];  Service: Orthopedics;  Laterality: Right;  . Calcaneal osteotomy Right 06/27/2015    Procedure: CALCANEAL OSTEOTOMY;  Surgeon: Wylene Simmer, MD;  Location: [place];  Service: Orthopedics;  Laterality: Right;  . Colonoscopy with propofol N/A 09/05/2015    Procedure: COLONOSCOPY WITH PROPOFOL;  Surgeon: Daneil Dolin, MD;  [facility];  Service: Endoscopy;  Laterality: N/A;  0830  . Polypectomy   09/05/2015    Procedure: POLYPECTOMY;  Surgeon: Daneil Dolin, MD;  [facility];  Service: Endoscopy;;    There were no vitals filed for this visit.      Subjective Assessment - 12/31/15 1728    Subjective PT states he's been doing alot of walking today and is aggrevated.  States his foot always feels better when he leaves here and can tell some improvments over time.   Currently in Pain? Yes   Pain Score 5    Pain Location Foot   Pain Orientation Right   Pain Descriptors / Indicators Aching   Pain Type Chronic pain                         [facility] Adult PT Treatment/Exercise - 12/31/15 1616    Manual Therapy   Manual Therapy Joint mobilization;Passive ROM   Manual therapy comments performed separately from all other skilled intervetntions    Joint Mobilization MTP flexion/extension, foot supination/pronation, calcaneal inversion/eversion, talar DF   Passive ROM R ankle PROM in all directions; great toe MTP extension in long sit position    Ankle Exercises: Stretches   Slant Board Stretch 3 reps;30 seconds;Other (comment)   Ankle Exercises: Standing   Heel Raises 15 reps   Other Standing Ankle Exercises Forward and lateral step-ups on BOSU x 1 set of 10 reps with B UE support   Ankle Exercises: Aerobic   Stationary Bike Nustep x 7 minutes at the beginning of PT tx on level 1-4; LE only    Ankle Exercises: Supine   T-Band R ankle inversion/eversion x 2 sets of 10 reps with red thera-band                   PT Short Term Goals - 12/19/15 1901    PT SHORT TERM GOAL #1   Title Patient will improve R ankle ROM by 7-10 degrees on all planes in order to improve range and overall gait mechancis    Baseline progressing towards goal; Pt continues to be limited with PF, inversion, and eversion AROM   Time 3   Period Weeks   Status On-going   PT SHORT TERM GOAL #2   Title Patient will be able to verballly describe the importance of proper,  supportive footwear that is sized correctly, as well as how to size it correctly,  in order to reduce pain and improve overall mechanics during mobilty    Time 3   Period Weeks   Status Achieved   PT SHORT TERM GOAL #3   Title Patient will be independent in correctly and consistently performing appropraite HEP, to be updated PRN    Time 3   Period Weeks   Status Achieved   PT SHORT TERM GOAL #4   Time 4           PT Long Term Goals - 12/19/15 1902    PT LONG TERM GOAL #1   Title Patient will demonstrate significant  improvements in gait, including equal step/stride lengths, reduced base of support, in order to enhance mobility and promote overall efficiency of gait    Baseline progressing towards goal   Time 6   Period Weeks   Status On-going   PT LONG TERM GOAL #2   Title Patient will be able to wear regular shoes on a regular basis secondary to improved ankle stabilty and strength, with pain no more than 2/10 and efficient gait mechanics in order to improve comfort of overall mobilty    Baseline progressing towards goal;    Time 6   Period Weeks   Status On-going   PT LONG TERM GOAL #3   Title Patient will demonstate 5/5 strength in R ankle in order to enhance overall stability and improve efficiency of mobltiy    Baseline progressing towards goal   Time 6   Period Weeks   Status On-going   PT LONG TERM GOAL #4   Title Patient will demo decreased R ankle edema by 0.5 cm with figure-8 measurement in order to improve comfort with wearing regular shoes and in order to imrpove R ankle mobility.    Baseline No changes assessed in figure-8 edema measurements since last re-eval; however, his edema fluctuates from day to day    Time 6   Period Weeks   Status On-going               Plan - 12/31/15 1731    Clinical Impression Statement Continued focus on Rt anklel/foot, improving mobility and strength for return to full functional status.  Pt continues to have the same  amount of pain at every visit but leaves without pain following therex and manual .  Expressed importance of completing HEP as he should.  Pt able to complete all therex today without c/o pain.  Noted improvement in mobility as completeing manual to foot.     Rehab Potential Good   PT Frequency 2x / week   PT Duration 6 weeks   PT Treatment/Interventions ADLs/Self Care Home Management;Gait training;Neuromuscular re-education;Ultrasound;Stair training;Functional mobility training;Patient/family education;Passive range of motion;Cryotherapy;Therapeutic activities;Electrical Stimulation;Therapeutic exercise;Manual techniques;DME Instruction;Balance training   PT Next Visit Plan Continue with manual therapy techniques to improve R ankle ROM and pain.  Add SLS and vector stance next session.    PT Home Exercise Plan Reviewed HEP with no additions made     Consulted and Agree with Plan of Care Patient      Patient will benefit from skilled therapeutic intervention in order to improve the following deficits and impairments:  Abnormal gait, Decreased endurance, Improper body mechanics, Decreased strength, Impaired flexibility, Postural dysfunction, Decreased mobility, Difficulty walking, Decreased range of motion, Pain, Decreased coordination  Visit Diagnosis: Pain in right ankle and joints of right foot  Difficulty in walking, not elsewhere classified  Stiffness of right ankle, not elsewhere classified  Muscle weakness (generalized)     Problem List Patient Active Problem List   Diagnosis Date Noted  . History of colonic polyps   . Flatfoot 06/27/2015  . OA (osteoarthritis) of hip 03/20/2015  . Hyponatremia 10/24/2013  . OA (osteoarthritis) of knee 10/23/2013  . Hepatomegaly 09/01/2012  . Lynch syndrome 09/01/2012  . GERD (gastroesophageal reflux disease) 09/01/2012  . Knee pain 08/12/2011    Teena Irani, PTA/CLT [phone] 12/31/2015, 5:48 PM  [facility] [address] [place], [place], [address] Phone: [phone]   Fax:  [phone]  Name: Craig Scott MRN: [id number]  Date of Birth: [birth year]

## 2016-01-02 ENCOUNTER — Ambulatory Visit (HOSPITAL_COMMUNITY): Payer: BC Managed Care – PPO | Admitting: Physical Therapy

## 2016-01-02 DIAGNOSIS — R262 Difficulty in walking, not elsewhere classified: Secondary | ICD-10-CM

## 2016-01-02 DIAGNOSIS — M25571 Pain in right ankle and joints of right foot: Secondary | ICD-10-CM

## 2016-01-02 DIAGNOSIS — M25671 Stiffness of right ankle, not elsewhere classified: Secondary | ICD-10-CM

## 2016-01-02 DIAGNOSIS — M6281 Muscle weakness (generalized): Secondary | ICD-10-CM

## 2016-01-02 NOTE — Therapy (Signed)
[place], [place], [address] Phone: [phone]   Fax:  [phone]  Physical Therapy Treatment  Patient Details  Name: Craig Scott MRN: [id number] Date of Birth: 1956/05/21 Referring Provider: Dr. Wylene Simmer  Encounter Date: 01/02/2016      PT End of Session - 01/02/16 1807    Visit Number 24   Number of Visits 30   Date for PT Re-Evaluation 01/17/16   Authorization Type BCBS    Authorization Time Period 12/19/2015 to 01/30/2016   PT Start Time 1610  started patient on Nustep for first 8 minutes of session, not included in billing    PT Stop Time 1645   PT Time Calculation (min) 35 min   Activity Tolerance Patient tolerated treatment well   Behavior During Therapy [facility] for tasks assessed/performed      Past Medical History  Diagnosis Date  . High cholesterol   . GERD (gastroesophageal reflux disease)   . Anxiety   . Neuropathy ([place])     both feet  . Arthritis     ra  . Lynch syndrome     has gene that causes cancer  . Tubular adenoma     Past Surgical History  Procedure Laterality Date  . Knee surgery Right     bilat   . Colonoscopy   12/09/01    RMR: Normal normal rectum/ A few scattered left-sided diverticula.  The remainder of the colonic  mucosa appeared normal  . Colonoscopy  12/15/2002    RMR: Normal rectum, scattered left-sided diverticula.  The remainder of the colonic mucosa appeared normal  . Colonoscopy  04/10/2004    RMR: Normal rectum/Sigmoid diverticula/The remainder of the colonic mucosa appeared normal  . Esophagogastroduodenoscopy  12/23/2005    RMR:   Esophagogastric peptic stricture with reflux esophagitis, status post  dilation as described above/ Small hiatal hernia, otherwise normal stomach.  Bulbar erosions otherwise normal D1 and D2  . Colonoscopy  12/23/2005    RMR: Normal rectum, scattered sigmoid diverticula Colonic mucosa appeared normal  . Colonoscopy     01/20/2008    RMR: Distal diminutive rectal polyps, status post cold biopsy removal  otherwise, normal rectum/ biopsy removal; scattered sigmoid diverticula; and benign-appearing/ulcers about the ileocecal valve, status post biopsy.  Remainder of colonic mucosa and terminal mucosa appeared normal.  . Colonoscopy  05/22/2010    RMR: distal diminutive rectal polyp s/p bx otherwise normal/few scattered pancolonic diverticula  . Nose surgery      cartiledge removal  . Colonoscopy  09/16/2012    Dr.Rourk- normal rectum, few scattered pancolonic diverticulosis, one diminutive polyp in the base of the cecum. 3x3 area of hyper pigmentation i the mid descending segment, this was a flat benign-appearing area o/w the remainder of the colonic mucosa appeared normal. bx= tubular adenoma and benign colonic mucosa  . Eye surgery  as child  . Tympanostomy tube placement Right     early 2014  . Joint replacement  09/30/2010    Right knee  . Total knee arthroplasty Left 10/23/2013    Procedure: LEFT TOTAL KNEE ARTHROPLASTY;  Surgeon: [name], MD;  [facility];  Service: Orthopedics;  Laterality: Left;  . Total hip arthroplasty Left 03/20/2015    Procedure: LEFT TOTAL HIP ARTHROPLASTY ANTERIOR APPROACH;  Surgeon: Gaynelle Arabian, MD;  [facility];  Service: Orthopedics;  Laterality: Left;  . Bunionectomy with hammertoe reconstruction and gastroc slide Right 06/27/2015    Procedure:  RIGHT GASTROC RECESSION/POSTERIOR TIBIAL TENOLYSIS/SECOND AND THIRD METATARSAL WEIL OSTEOTOMY AND HAMMERTOE CORRECTION;  Surgeon: Wylene Simmer, MD;  Location: [place];  Service: Orthopedics;  Laterality: Right;  . Calcaneal osteotomy Right 06/27/2015    Procedure: CALCANEAL OSTEOTOMY;  Surgeon: Wylene Simmer, MD;  Location: [place];  Service: Orthopedics;  Laterality: Right;  . Colonoscopy with propofol N/A 09/05/2015    Procedure: COLONOSCOPY WITH PROPOFOL;  Surgeon: Daneil Dolin, MD;   [facility];  Service: Endoscopy;  Laterality: N/A;  0830  . Polypectomy  09/05/2015    Procedure: POLYPECTOMY;  Surgeon: Daneil Dolin, MD;  [facility];  Service: Endoscopy;;    There were no vitals filed for this visit.      Subjective Assessment - 01/02/16 1802    Subjective Patient arrives reporting he is feeling relatively good right now, happy with how his foot is doing and can tell improvements over time. Reports he has not been wearing sneakers as much because they bother the side of his foot.    Pertinent History Patient had extensive surgery on R foot including toes, heel, and arch;   Patient Stated Goals be able to wear regular shoes, reduce pain, and walk normally    Currently in Pain? Yes   Pain Score 4    Pain Location Foot   Pain Orientation Right   Pain Descriptors / Indicators Aching   Pain Type Chronic pain   Pain Radiating Towards none    Pain Onset More than a month ago   Pain Frequency Intermittent   Aggravating Factors  WB, initial steps    Pain Relieving Factors ice and elevation    Effect of Pain on Daily Activities difficutly with initial gait, long distance ambulation, prolnioged standing                          [facility] Adult PT Treatment/Exercise - 01/02/16 0001    Ambulation/Gait   Gait Comments 1x428ft with focus on improving pushoff from toes today   Manual Therapy   Manual Therapy Joint mobilization   Manual therapy comments performed separately from all other skilled intervetntions    Joint Mobilization MTP flexion/extension, foot supination/pronation, calcaneal inversion/eversion, talar DF  added CKC talar DF mobilization today as well    Ankle Exercises: Stretches   Slant Board Stretch 3 reps;30 seconds;Other (comment)   Ankle Exercises: Aerobic   Stationary Bike Nustep x 8 minutes at the beginning of PT tx on level 1-4; LE only    Ankle Exercises: Standing   Other Standing Ankle Exercises Forward and  lateral step-ups on BOSU x 1 set of 15 reps with B UE support   Other Standing Ankle Exercises rocker board x2 minutes AP and 2 minutes lateral no UEs, min guard                 PT Education - 01/02/16 1807    Education provided Yes   Education Details re-assess next session    Person(s) Educated Patient   Methods Explanation   Comprehension Verbalized understanding          PT Short Term Goals - 12/19/15 1901    PT SHORT TERM GOAL #1   Title Patient will improve R ankle ROM by 7-10 degrees on all planes in order to improve range and overall gait mechancis    Baseline progressing towards goal; Pt continues to be limited with PF, inversion, and  eversion AROM   Time 3   Period Weeks   Status On-going   PT SHORT TERM GOAL #2   Title Patient will be able to verballly describe the importance of proper, supportive footwear that is sized correctly, as well as how to size it correctly,  in order to reduce pain and improve overall mechanics during mobilty    Time 3   Period Weeks   Status Achieved   PT SHORT TERM GOAL #3   Title Patient will be independent in correctly and consistently performing appropraite HEP, to be updated PRN    Time 3   Period Weeks   Status Achieved   PT SHORT TERM GOAL #4   Time 4           PT Long Term Goals - 12/19/15 1902    PT LONG TERM GOAL #1   Title Patient will demonstrate significant improvements in gait, including equal step/stride lengths, reduced base of support, in order to enhance mobility and promote overall efficiency of gait    Baseline progressing towards goal   Time 6   Period Weeks   Status On-going   PT LONG TERM GOAL #2   Title Patient will be able to wear regular shoes on a regular basis secondary to improved ankle stabilty and strength, with pain no more than 2/10 and efficient gait mechanics in order to improve comfort of overall mobilty    Baseline progressing towards goal;    Time 6   Period Weeks   Status  On-going   PT LONG TERM GOAL #3   Title Patient will demonstate 5/5 strength in R ankle in order to enhance overall stability and improve efficiency of mobltiy    Baseline progressing towards goal   Time 6   Period Weeks   Status On-going   PT LONG TERM GOAL #4   Title Patient will demo decreased R ankle edema by 0.5 cm with figure-8 measurement in order to improve comfort with wearing regular shoes and in order to imrpove R ankle mobility.    Baseline No changes assessed in figure-8 edema measurements since last re-eval; however, his edema fluctuates from day to day    Time 6   Period Weeks   Status On-going               Plan - 01/02/16 1808    Clinical Impression Statement Patient arrived today reporting that he is feeling relatively well; began session with unsupervised exercise on Nustep (not includd in billing at this time). Continued otherwise with functional stretching and proceeded immediately to manual to R foot today, performing typical joint mbilizations and adding closed chain  talar DF mobilzation on 12 inch box, which patient reports really seemed to feel good and help mobiltiy of his ankle. Performed some gait training immecdiately after mobilizations today, with cues for improved toe pushoff during gait. Finsihed exercise with functional exercises in standing, progessed rockerboard to no UE support today, which patient reports is very challenging without use of UEs to assist in controlling and stabilzing board.    Rehab Potential Good   Clinical Impairments Affecting Rehab Potential hip pain    PT Frequency 2x / week   PT Duration 6 weeks   PT Treatment/Interventions ADLs/Self Care Home Management;Gait training;Neuromuscular re-education;Ultrasound;Stair training;Functional mobility training;Patient/family education;Passive range of motion;Cryotherapy;Therapeutic activities;Electrical Stimulation;Therapeutic exercise;Manual techniques;DME Instruction;Balance training    PT Next Visit Plan Re-assess    PT Home Exercise Plan Reviewed HEP with no additions made  Consulted and Agree with Plan of Care Patient      Patient will benefit from skilled therapeutic intervention in order to improve the following deficits and impairments:  Abnormal gait, Decreased endurance, Improper body mechanics, Decreased strength, Impaired flexibility, Postural dysfunction, Decreased mobility, Difficulty walking, Decreased range of motion, Pain, Decreased coordination  Visit Diagnosis: Pain in right ankle and joints of right foot  Difficulty in walking, not elsewhere classified  Stiffness of right ankle, not elsewhere classified  Muscle weakness (generalized)     Problem List Patient Active Problem List   Diagnosis Date Noted  . History of colonic polyps   . Flatfoot 06/27/2015  . OA (osteoarthritis) of hip 03/20/2015  . Hyponatremia 10/24/2013  . OA (osteoarthritis) of knee 10/23/2013  . Hepatomegaly 09/01/2012  . Lynch syndrome 09/01/2012  . GERD (gastroesophageal reflux disease) 09/01/2012  . Knee pain 08/12/2011    Deniece Ree PT, DPT [phone]  [facility] [address] [place], [place], [address] Phone: [phone]   Fax:  [phone]  Name: Craig Scott MRN: [id number] Date of Birth: 1955-11-04

## 2016-01-09 ENCOUNTER — Ambulatory Visit (HOSPITAL_COMMUNITY): Payer: BC Managed Care – PPO | Admitting: Physical Therapy

## 2016-01-09 DIAGNOSIS — R262 Difficulty in walking, not elsewhere classified: Secondary | ICD-10-CM

## 2016-01-09 DIAGNOSIS — M6281 Muscle weakness (generalized): Secondary | ICD-10-CM

## 2016-01-09 DIAGNOSIS — M25571 Pain in right ankle and joints of right foot: Secondary | ICD-10-CM

## 2016-01-09 DIAGNOSIS — M25671 Stiffness of right ankle, not elsewhere classified: Secondary | ICD-10-CM

## 2016-01-09 NOTE — Patient Instructions (Signed)
   SINGLE LEG STANCE - SLS  Stand on one leg and maintain your balance. You may do this at a counter or at a chair for safety.  Hold as long as you can, repeat 3 times each each side, at least once a day.    TANDEM STANCE WITH SUPPORT  Stand in front of a chair, table or counter top for support. Then place the heel of one foot so that it is touching the toes of the other foot. Maintain your balance in this position.   You may do this in front of the counter or in front of a chair for safety.  Hold as long as you can, then switch your feet. Repeat twice each side, twice a day.

## 2016-01-09 NOTE — Therapy (Signed)
[facility] [address] [place], [place], [address] Phone: [phone]   Fax:  [phone]  Physical Therapy Treatment (Discharge)  Patient Details  Name: Craig Scott MRN: [id number] Date of Birth: 29-Nov-1955 Referring Provider: Dr. Wylene Simmer  Encounter Date: 01/09/2016      PT End of Session - 01/09/16 1800    Visit Number 25   Number of Visits 25   Authorization Type [employer]    Authorization Time Period 12/19/2015 to 01/30/2016   PT Start Time 1603   PT Stop Time 1649   PT Time Calculation (min) 46 min   Activity Tolerance Patient tolerated treatment well   Behavior During Therapy [facility] for tasks assessed/performed      Past Medical History  Diagnosis Date  . High cholesterol   . GERD (gastroesophageal reflux disease)   . Anxiety   . Neuropathy ([place])     both feet  . Arthritis     ra  . Lynch syndrome     has gene that causes cancer  . Tubular adenoma     Past Surgical History  Procedure Laterality Date  . Knee surgery Right     bilat   . Colonoscopy   12/09/01    RMR: Normal normal rectum/ A few scattered left-sided diverticula.  The remainder of the colonic  mucosa appeared normal  . Colonoscopy  12/15/2002    RMR: Normal rectum, scattered left-sided diverticula.  The remainder of the colonic mucosa appeared normal  . Colonoscopy  04/10/2004    RMR: Normal rectum/Sigmoid diverticula/The remainder of the colonic mucosa appeared normal  . Esophagogastroduodenoscopy  12/23/2005    RMR:   Esophagogastric peptic stricture with reflux esophagitis, status post  dilation as described above/ Small hiatal hernia, otherwise normal stomach.  Bulbar erosions otherwise normal D1 and D2  . Colonoscopy  12/23/2005    RMR: Normal rectum, scattered sigmoid diverticula Colonic mucosa appeared normal  . Colonoscopy    01/20/2008    RMR: Distal diminutive rectal polyps, status post cold biopsy removal  otherwise, normal rectum/ biopsy  removal; scattered sigmoid diverticula; and benign-appearing/ulcers about the ileocecal valve, status post biopsy.  Remainder of colonic mucosa and terminal mucosa appeared normal.  . Colonoscopy  05/22/2010    RMR: distal diminutive rectal polyp s/p bx otherwise normal/few scattered pancolonic diverticula  . Nose surgery      cartiledge removal  . Colonoscopy  09/16/2012    Dr.Rourk- normal rectum, few scattered pancolonic diverticulosis, one diminutive polyp in the base of the cecum. 3x3 area of hyper pigmentation i the mid descending segment, this was a flat benign-appearing area o/w the remainder of the colonic mucosa appeared normal. bx= tubular adenoma and benign colonic mucosa  . Eye surgery  as child  . Tympanostomy tube placement Right     early 2014  . Joint replacement  09/30/2010    Right knee  . Total knee arthroplasty Left 10/23/2013    Procedure: LEFT TOTAL KNEE ARTHROPLASTY;  Surgeon: [name], MD;  Location: [facility];  Service: Orthopedics;  Laterality: Left;  . Total hip arthroplasty Left 03/20/2015    Procedure: LEFT TOTAL HIP ARTHROPLASTY ANTERIOR APPROACH;  Surgeon: Gaynelle Arabian, MD;  Location: [facility];  Service: Orthopedics;  Laterality: Left;  . Bunionectomy with hammertoe reconstruction and gastroc slide Right 06/27/2015    Procedure: RIGHT GASTROC RECESSION/POSTERIOR TIBIAL TENOLYSIS/SECOND AND THIRD METATARSAL WEIL OSTEOTOMY AND HAMMERTOE CORRECTION;  Surgeon: Wylene Simmer, MD;  Location: [facility];  Service: Orthopedics;  Laterality: Right;  . Calcaneal osteotomy Right 06/27/2015    Procedure: CALCANEAL OSTEOTOMY;  Surgeon: Wylene Simmer, MD;  Location: [facility];  Service: Orthopedics;  Laterality: Right;  . Colonoscopy with propofol N/A 09/05/2015    Procedure: COLONOSCOPY WITH PROPOFOL;  Surgeon: Daneil Dolin, MD;  [facility];  Service: Endoscopy;  Laterality: N/A;  0830  . Polypectomy  09/05/2015    Procedure:  POLYPECTOMY;  Surgeon: Daneil Dolin, MD;  [facility];  Service: Endoscopy;;    There were no vitals filed for this visit.      Subjective Assessment - 01/09/16 1605    Subjective patient arrives reporting he is doing well, having a little bit of pain but otherwise doing OK. He reports that recently he has been able to walk and stand longer; almost fell trying to get up at restaurant but caught his balance. He still cannot stand up in the shower, especially when he closes his eyes to wash his face. He reports he has a hard time getting out of bed with nothing on his foot, not even a sock, is still very difficult.    Pertinent History Patient had extensive surgery on R foot including toes, heel, and arch;   How long can you sit comfortably? 4/27- unlimited    How long can you stand comfortably? 4/27- reports he could stand for about 1-3 hours if he has to    How long can you walk comfortably? 4/27- 60 minutes    Patient Stated Goals be able to wear regular shoes, reduce pain, and walk normally    Currently in Pain? Yes   Pain Score 3    Pain Location Foot   Pain Orientation Right   Pain Descriptors / Indicators Aching   Pain Type Chronic pain   Pain Radiating Towards none    Pain Onset More than a month ago   Pain Frequency Intermittent   Aggravating Factors  WB, initial steps    Pain Relieving Factors ice and elevation    Effect of Pain on Daily Activities difficulty with initial gain, long distance ambulation             [facility] PT Assessment - 01/09/16 0001    AROM   Right Ankle Dorsiflexion 17   Right Ankle Plantar Flexion 44   Right Ankle Inversion 13   Right Ankle Eversion 15   Strength   Right Hip Flexion 5/5   Right Hip Extension 4/5   Right Hip ABduction 4+/5   Left Hip Flexion 5/5   Left Hip Extension 3+/5   Left Hip ABduction 4/5   Right Knee Flexion 4+/5   Right Knee Extension 4+/5   Left Knee Flexion 4+/5   Left Knee Extension 4+/5   Right  Ankle Dorsiflexion 5/5   Right Ankle Plantar Flexion 2/5   Right Ankle Inversion 5/5   Right Ankle Eversion 5/5   Left Ankle Dorsiflexion 5/5   Left Ankle Plantar Flexion 4/5   Left Ankle Inversion 4+/5   Left Ankle Eversion 4+/5   6 minute walk test results    Aerobic Endurance Distance Walked 974   Endurance additional comments 6MWT, no device no breaks    High Level Balance   High Level Balance Comments difficulty with SLS,use of fingertips to maintain balance  PT Education - 01/09/16 1759    Education provided Yes   Education Details DC today, [employer] waiver, balance exercises    Person(s) Educated Patient   Methods Explanation;Demonstration;Handout   Comprehension Verbalized understanding          PT Short Term Goals - 01/09/16 1633    PT SHORT TERM GOAL #1   Title Patient will improve R ankle ROM by 7-10 degrees on all planes in order to improve range and overall gait mechancis    Time 3   Period Weeks   Status Achieved   PT SHORT TERM GOAL #2   Title Patient will be able to verballly describe the importance of proper, supportive footwear that is sized correctly, as well as how to size it correctly,  in order to reduce pain and improve overall mechanics during mobilty    Time 3   Period Weeks   Status Achieved   PT SHORT TERM GOAL #3   Title Patient will be independent in correctly and consistently performing appropraite HEP, to be updated PRN    Baseline 4/27- patient reports that he has been very persistent with this    Time 3   Period Weeks   Status Achieved           PT Long Term Goals - 01/09/16 1635    PT LONG TERM GOAL #1   Title Patient will demonstrate significant improvements in gait, including equal step/stride lengths, reduced base of support, in order to enhance mobility and promote overall efficiency of gait    Time 6   Period Weeks   Status Partially Met   PT LONG TERM GOAL #2   Title Patient  will be able to wear regular shoes on a regular basis secondary to improved ankle stabilty and strength, with pain no more than 2/10 and efficient gait mechanics in order to improve comfort of overall mobilty    Baseline 4/27- constant pain, averages 3-4/10   Time 6   Period Weeks   Status Partially Met   PT LONG TERM GOAL #3   Title Patient will demonstate 5/5 strength in R ankle in order to enhance overall stability and improve efficiency of mobltiy    Time 6   Period Weeks   Status Partially Met   PT LONG TERM GOAL #4   Title Patient will demo decreased R ankle edema by 0.5 cm with figure-8 measurement in order to improve comfort with wearing regular shoes and in order to imrpove R ankle mobility.    Baseline 4/27- not measured however patient reports minimal edema    Time 6   Period Weeks   Status Partially Met               Plan - 01/09/16 1800    Clinical Impression Statement Re-assessment performed today. Patient has made excellent progrses with skilled PT services, and shows great improvement in his R ankle ROM, gait mechanics and tolerance, and abiltiy to particiapte in functional work based task performance. At this time patient appears to have reached maximal benefit from skilled PT services, and is in agreement with DC today. He was given [employer] and advanced balance program for use at home. DC today, no further need for skilled PT services.    Rehab Potential Good   Clinical Impairments Affecting Rehab Potential hip pain    PT Frequency 2x / week   PT Duration 6 weeks   PT Treatment/Interventions ADLs/Self Care Home Management;Gait training;Neuromuscular re-education;Ultrasound;Stair training;Functional mobility  training;Patient/family education;Passive range of motion;Cryotherapy;Therapeutic activities;Electrical Stimulation;Therapeutic exercise;Manual techniques;DME Instruction;Balance training   PT Next Visit Plan DC today    PT Home Exercise Plan Reviewed HEP  with no additions made     Consulted and Agree with Plan of Care Patient      Patient will benefit from skilled therapeutic intervention in order to improve the following deficits and impairments:  Abnormal gait, Decreased endurance, Improper body mechanics, Decreased strength, Impaired flexibility, Postural dysfunction, Decreased mobility, Difficulty walking, Decreased range of motion, Pain, Decreased coordination  Visit Diagnosis: Pain in right ankle and joints of right foot  Difficulty in walking, not elsewhere classified  Stiffness of right ankle, not elsewhere classified  Muscle weakness (generalized)     Problem List Patient Active Problem List   Diagnosis Date Noted  . History of colonic polyps   . Flatfoot 06/27/2015  . OA (osteoarthritis) of hip 03/20/2015  . Hyponatremia 10/24/2013  . OA (osteoarthritis) of knee 10/23/2013  . Hepatomegaly 09/01/2012  . Lynch syndrome 09/01/2012  . GERD (gastroesophageal reflux disease) 09/01/2012  . Knee pain 08/12/2011   PHYSICAL THERAPY DISCHARGE SUMMARY  Visits from Start of Care: 25  Current functional level related to goals / functional outcomes: Patient appears to have reached optimal level of function at this time; DC from skilled PT services    Remaining deficits: Ankle stiffness, muscle weakness, gait impairment    Education / Equipment: [employer] waiver, balance exercise  Plan: Patient agrees to discharge.  Patient goals were partially met. Patient is being discharged due to being pleased with the current functional level.  ?????       Deniece Ree PT, DPT [facility] [address] [place], [place], [address] Phone: [phone]   Fax:  [phone]  Name: Craig Scott MRN: [id number] Date of Birth: 12/26/1955

## 2016-04-18 ENCOUNTER — Emergency Department (HOSPITAL_COMMUNITY)
Admission: EM | Admit: 2016-04-18 | Discharge: 2016-04-18 | Disposition: A | Discharge status: Home / Self Care | Payer: BC Managed Care – PPO | Attending: Emergency Medicine | Admitting: Emergency Medicine

## 2016-04-18 ENCOUNTER — Encounter (HOSPITAL_COMMUNITY): Payer: Self-pay | Admitting: Emergency Medicine

## 2016-04-18 DIAGNOSIS — Y999 Unspecified external cause status: Secondary | ICD-10-CM | POA: Diagnosis not present

## 2016-04-18 DIAGNOSIS — S90821A Blister (nonthermal), right foot, initial encounter: Secondary | ICD-10-CM | POA: Diagnosis present

## 2016-04-18 DIAGNOSIS — Y929 Unspecified place or not applicable: Secondary | ICD-10-CM | POA: Insufficient documentation

## 2016-04-18 DIAGNOSIS — W57XXXA Bitten or stung by nonvenomous insect and other nonvenomous arthropods, initial encounter: Secondary | ICD-10-CM | POA: Insufficient documentation

## 2016-04-18 DIAGNOSIS — Z7982 Long term (current) use of aspirin: Secondary | ICD-10-CM | POA: Insufficient documentation

## 2016-04-18 DIAGNOSIS — S90521A Blister (nonthermal), right ankle, initial encounter: Secondary | ICD-10-CM

## 2016-04-18 DIAGNOSIS — Z79899 Other long term (current) drug therapy: Secondary | ICD-10-CM | POA: Diagnosis not present

## 2016-04-18 DIAGNOSIS — Y939 Activity, unspecified: Secondary | ICD-10-CM | POA: Insufficient documentation

## 2016-04-18 DIAGNOSIS — Z87891 Personal history of nicotine dependence: Secondary | ICD-10-CM | POA: Diagnosis not present

## 2016-04-18 LAB — CBC WITH DIFFERENTIAL/PLATELET
BASOS ABS: 0 10*3/uL (ref 0.0–0.1)
BASOS PCT: 1 %
EOS ABS: 0.2 10*3/uL (ref 0.0–0.7)
EOS PCT: 4 %
HEMATOCRIT: 44.1 % (ref 39.0–52.0)
Hemoglobin: 15.2 g/dL (ref 13.0–17.0)
Lymphocytes Relative: 22 %
Lymphs Abs: 1.2 10*3/uL (ref 0.7–4.0)
MCH: 31.1 pg (ref 26.0–34.0)
MCHC: 34.5 g/dL (ref 30.0–36.0)
MCV: 90.2 fL (ref 78.0–100.0)
MONO ABS: 0.3 10*3/uL (ref 0.1–1.0)
Monocytes Relative: 6 %
NEUTROS ABS: 3.6 10*3/uL (ref 1.7–7.7)
Neutrophils Relative %: 67 %
PLATELETS: 215 10*3/uL (ref 150–400)
RBC: 4.89 MIL/uL (ref 4.22–5.81)
RDW: 12.5 % (ref 11.5–15.5)
WBC: 5.3 10*3/uL (ref 4.0–10.5)

## 2016-04-18 LAB — BASIC METABOLIC PANEL
ANION GAP: 5 (ref 5–15)
BUN: 15 mg/dL (ref 6–20)
CALCIUM: 8.5 mg/dL — AB (ref 8.9–10.3)
CO2: 24 mmol/L (ref 22–32)
CREATININE: 0.75 mg/dL (ref 0.61–1.24)
Chloride: 104 mmol/L (ref 101–111)
Glucose, Bld: 161 mg/dL — ABNORMAL HIGH (ref 65–99)
Potassium: 3.7 mmol/L (ref 3.5–5.1)
SODIUM: 133 mmol/L — AB (ref 135–145)

## 2016-04-18 MED ORDER — DOXYCYCLINE HYCLATE 100 MG PO TABS
100.0000 mg | ORAL_TABLET | Freq: Once | ORAL | Status: AC
  Administered 2016-04-18: 100 mg via ORAL
  Filled 2016-04-18: qty 1

## 2016-04-18 MED ORDER — DOXYCYCLINE HYCLATE 100 MG PO CAPS: 100.0000 mg | ORAL_CAPSULE | Freq: Two times a day (BID) | ORAL | 0 refills | Status: DC

## 2016-04-18 NOTE — Discharge Instructions (Signed)
Your vital signs are within normal limits. Your complete blood count does not show any evidence of acute infection on. Your glucose is elevated at 161. Please have Dr.Fusco, or member of his team on recheck this for you. Please use a protective dressing over the blister area on. Please use doxycycline as a cautionary measure. Please return to the emergency department if any high fever, red streaking going up the leg, or excessive changes in your general condition related to your leg.

## 2016-04-18 NOTE — ED Provider Notes (Signed)
[facility] DEPT Provider Note   CSN: [id number] Arrival date & time: 04/18/16  [id number]  First Provider Contact:  First MD Initiated Contact with Patient 04/18/16 0957        History   Chief Complaint Chief Complaint  Patient presents with  . Blister    HPI Craig Scott is a 60 y.o. male.  Patient is a 60 year old male who presents to the emergency department with a complaint of a blister on his right lower leg.  The patient states that he has had multiple operations on the lower extremities, the right more than the left. He reports having had a knee replacement, hip replacement, and major reconstructive surgery involving the right ankle. He reports history of a brown recluse spider bite in the past, as well as a copperhead bite on the same side. Patient states that he is in the woods a lot, [place]. He thought he had gotten into some poison ivy, or had some insect bites on his right leg. He noticed however that on the inner aspect of the right leg he noted a small area that was getting larger. This is been happening over the last 2-3 days. Today he has a large clear blister present. The area is not hot, it is not unusually tender, and the patient denies any temperature elevations on. He is concerned because of some any previous procedures involving this leg and him being in the woods and in the [facility] a lot, that he may have picked up some form of a staph infection. He presents now for evaluation.       Past Medical History:  Diagnosis Date  . Anxiety   . Arthritis    ra  . GERD (gastroesophageal reflux disease)   . High cholesterol   . Lynch syndrome    has gene that causes cancer  . Neuropathy ([place])    both feet  . Tubular adenoma     Patient Active Problem List   Diagnosis Date Noted  . History of colonic polyps   . Flatfoot 06/27/2015  . OA (osteoarthritis) of hip 03/20/2015  . Hyponatremia 10/24/2013  . OA (osteoarthritis)  of knee 10/23/2013  . Hepatomegaly 09/01/2012  . Lynch syndrome 09/01/2012  . GERD (gastroesophageal reflux disease) 09/01/2012  . Knee pain 08/12/2011    Past Surgical History:  Procedure Laterality Date  . BUNIONECTOMY WITH HAMMERTOE RECONSTRUCTION AND GASTROC SLIDE Right 06/27/2015   Procedure: RIGHT GASTROC RECESSION/POSTERIOR TIBIAL TENOLYSIS/SECOND AND THIRD METATARSAL WEIL OSTEOTOMY AND HAMMERTOE CORRECTION;  Surgeon: Wylene Simmer, MD;  Location: [facility];  Service: Orthopedics;  Laterality: Right;  . CALCANEAL OSTEOTOMY Right 06/27/2015   Procedure: CALCANEAL OSTEOTOMY;  Surgeon: Wylene Simmer, MD;  Location: [facility];  Service: Orthopedics;  Laterality: Right;  . COLONOSCOPY   12/09/01   RMR: Normal normal rectum/ A few scattered left-sided diverticula.  The remainder of the colonic  mucosa appeared normal  . COLONOSCOPY  12/15/2002   RMR: Normal rectum, scattered left-sided diverticula.  The remainder of the colonic mucosa appeared normal  . COLONOSCOPY  04/10/2004   RMR: Normal rectum/Sigmoid diverticula/The remainder of the colonic mucosa appeared normal  . COLONOSCOPY  12/23/2005   RMR: Normal rectum, scattered sigmoid diverticula Colonic mucosa appeared normal  . COLONOSCOPY    01/20/2008   RMR: Distal diminutive rectal polyps, status post cold biopsy removal  otherwise, normal rectum/ biopsy removal; scattered sigmoid diverticula; and benign-appearing/ulcers about  the ileocecal valve, status post biopsy.  Remainder of colonic mucosa and terminal mucosa appeared normal.  . COLONOSCOPY  05/22/2010   RMR: distal diminutive rectal polyp s/p bx otherwise normal/few scattered pancolonic diverticula  . COLONOSCOPY  09/16/2012   Dr.Rourk- normal rectum, few scattered pancolonic diverticulosis, one diminutive polyp in the base of the cecum. 3x3 area of hyper pigmentation i the mid descending segment, this was a flat benign-appearing area o/w the remainder  of the colonic mucosa appeared normal. bx= tubular adenoma and benign colonic mucosa  . COLONOSCOPY WITH PROPOFOL N/A 09/05/2015   Procedure: COLONOSCOPY WITH PROPOFOL;  Surgeon: Daneil Dolin, MD;  [facility];  Service: Endoscopy;  Laterality: N/A;  0830  . ESOPHAGOGASTRODUODENOSCOPY  12/23/2005   RMR:   Esophagogastric peptic stricture with reflux esophagitis, status post  dilation as described above/ Small hiatal hernia, otherwise normal stomach.  Bulbar erosions otherwise normal D1 and D2  . EYE SURGERY  as child  . JOINT REPLACEMENT  09/30/2010   Right knee  . KNEE SURGERY Right    bilat   . NOSE SURGERY     cartiledge removal  . POLYPECTOMY  09/05/2015   Procedure: POLYPECTOMY;  Surgeon: Daneil Dolin, MD;  [facility];  Service: Endoscopy;;  . TOTAL HIP ARTHROPLASTY Left 03/20/2015   Procedure: LEFT TOTAL HIP ARTHROPLASTY ANTERIOR APPROACH;  Surgeon: Gaynelle Arabian, MD;  [facility];  Service: Orthopedics;  Laterality: Left;  . TOTAL KNEE ARTHROPLASTY Left 10/23/2013   Procedure: LEFT TOTAL KNEE ARTHROPLASTY;  Surgeon: [name], MD;  [facility];  Service: Orthopedics;  Laterality: Left;  . TYMPANOSTOMY TUBE PLACEMENT Right    early 2014       Home Medications    Prior to Admission medications   Medication Sig Start Date End Date Taking? Authorizing Provider  ALPRAZolam Duanne Moron) 1 MG tablet Take 1 mg by mouth every 6 (six) hours as needed for anxiety. anxiety    Historical Provider, MD  aspirin EC 325 MG tablet Take 1 tablet (325 mg total) by mouth daily. 06/27/15   Corky Sing, PA-C  cetirizine (ZYRTEC) 10 MG tablet Take 10 mg by mouth daily.    Historical Provider, MD  meloxicam (MOBIC) 7.5 MG tablet Take 7.5 mg by mouth 2 (two) times daily.    Historical Provider, MD  Omega-3 Fatty Acids (FISH OIL PO) Take 1 capsule by mouth daily.    Historical Provider, MD  omeprazole (PRILOSEC) 20 MG capsule Take 20 mg by mouth daily.     Historical Provider, MD  polyethylene glycol-electrolytes (NULYTELY/GOLYTELY) 420 G solution Take 4,000 mLs by mouth once. 08/27/15   Daneil Dolin, MD  POTASSIUM GLUCONATE PO Take 1 tablet by mouth daily.    Historical Provider, MD  simvastatin (ZOCOR) 20 MG tablet Take 20 mg by mouth every morning.     Historical Provider, MD    Family History Family History  Problem Relation Age of Onset  . Colon cancer Brother     42   . Colon cancer Brother     25  . Colon cancer Mother     age 67   . Colon cancer Cousin     72 years old onset  . Colon cancer Cousin     58 years onset    Social History Social History  Substance Use Topics  . Smoking status: Former Smoker    Packs/day: 0.50    Years: 30.00  Types: Cigars, Cigarettes    Quit date: 09/14/2005  . Smokeless tobacco: Never Used     Comment: quit 4-5 years ago  . Alcohol use 4.2 oz/week    7 Cans of beer per week     Comment: daily, multiple beers in the evening      Allergies   Lyrica [pregabalin] and Celebrex [celecoxib]   Review of Systems Review of Systems  Constitutional: Negative for chills and fever.  Endocrine: Negative for cold intolerance and heat intolerance.  Musculoskeletal: Positive for arthralgias.  Psychiatric/Behavioral: The patient is nervous/anxious.   All other systems reviewed and are negative.    Physical Exam Updated Vital Signs BP 137/83 (BP Location: Left Arm)   Pulse 100   Temp 98.2 F (36.8 C) (Oral)   Resp 16   Ht 6' (1.829 m)   Wt 101.6 kg   SpO2 96%   BMI 30.38 kg/m   Physical Exam  Constitutional: He is oriented to person, place, and time. He appears well-developed and well-nourished.  Non-toxic appearance.  HENT:  Head: Normocephalic.  Right Ear: Tympanic membrane and external ear normal.  Left Ear: Tympanic membrane and external ear normal.  Eyes: EOM and lids are normal. Pupils are equal, round, and reactive to light.  Neck: Normal range of motion. Neck supple.  Carotid bruit is not present.  Cardiovascular: Normal rate, regular rhythm, normal heart sounds, intact distal pulses and normal pulses.   Pulmonary/Chest: Breath sounds normal. No respiratory distress.  Abdominal: Soft. Bowel sounds are normal. There is no tenderness. There is no guarding.  Musculoskeletal: Normal range of motion.  There are multiple well-healed surgical scars involving the right knee, and the right ankle. There is a clear list are on the medial aspect of the right ankle. There is no red streaks around the blister area. There is no redness appreciated air. And there is no increased warmth appreciated of the lower extremity. There no lesions noted between the toes. There is no puncture wound or other lesions of the plantar surface of the foot. There no palpable nodes in the inguinal area, and no tenderness in the inguinal area.  Lymphadenopathy:       Head (right side): No submandibular adenopathy present.       Head (left side): No submandibular adenopathy present.    He has no cervical adenopathy.  Neurological: He is alert and oriented to person, place, and time. He has normal strength. No cranial nerve deficit or sensory deficit.  Skin: Skin is warm and dry.  Psychiatric: He has a normal mood and affect. His speech is normal.  Nursing note and vitals reviewed.    ED Treatments / Results  Labs (all labs ordered are listed, but only abnormal results are displayed) Labs Reviewed  CBC WITH DIFFERENTIAL/PLATELET  BASIC METABOLIC PANEL    EKG  EKG Interpretation None       Radiology No results found.  Procedures Procedures (including critical care time)  Medications Ordered in ED Medications - No data to display   Initial Impression / Assessment and Plan / ED Course  I have reviewed the triage vital signs and the nursing notes.  Pertinent labs & imaging results that were available during my care of the patient were reviewed by me and considered in my  medical decision making (see chart for details).  Clinical Course    *I have reviewed nursing notes, vital signs, and all appropriate lab and imaging results for this patient.**  Final Clinical Impressions(s) /  ED Diagnoses  The complete blood count is well within normal limits. The basic metabolic panel shows a glucose to be elevated at 161, otherwise within normal limits. The vital signs are well within normal limits on. There is no evidence of acute cellulitis clinically. The patient will be given a protective dressing to the blister area of the right lower extremity. He will be given on doxycycline as a precaution measure since he has been out of the woods a lot and [place]. The patient is to follow-up with primary physician. Patient will return to the emergency department sooner if any changes, problems, or concerns.    Final diagnoses:  None    New Prescriptions New Prescriptions   No medications on file     Lily Kocher, Hershal Coria 04/18/16 1207    Milton Ferguson, MD 04/19/16 [phone]

## 2016-04-18 NOTE — ED Triage Notes (Signed)
Pt reports blister on medial right ankle x3 days, unknown cause.  Pt had reconstructive surgery on right foot last October, pt is concerned about staph infection.

## 2016-04-18 NOTE — ED Notes (Signed)
PA at bedside.

## 2016-05-08 ENCOUNTER — Other Ambulatory Visit (HOSPITAL_COMMUNITY): Payer: Self-pay | Admitting: Family Medicine

## 2016-05-08 ENCOUNTER — Ambulatory Visit (HOSPITAL_COMMUNITY)
Admission: RE | Admit: 2016-05-08 | Discharge: 2016-05-08 | Disposition: A | Discharge status: Home / Self Care | Payer: BC Managed Care – PPO | Source: Ambulatory Visit | Attending: Family Medicine | Admitting: Family Medicine

## 2016-05-08 DIAGNOSIS — M79674 Pain in right toe(s): Secondary | ICD-10-CM

## 2016-05-08 DIAGNOSIS — M79675 Pain in left toe(s): Secondary | ICD-10-CM | POA: Insufficient documentation

## 2016-06-23 ENCOUNTER — Encounter (HOSPITAL_BASED_OUTPATIENT_CLINIC_OR_DEPARTMENT_OTHER): Payer: Self-pay | Admitting: *Deleted

## 2016-06-24 ENCOUNTER — Other Ambulatory Visit: Payer: Self-pay | Admitting: Orthopedic Surgery

## 2016-06-25 ENCOUNTER — Ambulatory Visit (HOSPITAL_BASED_OUTPATIENT_CLINIC_OR_DEPARTMENT_OTHER): Payer: BC Managed Care – PPO | Admitting: Certified Registered"

## 2016-06-25 ENCOUNTER — Ambulatory Visit (HOSPITAL_BASED_OUTPATIENT_CLINIC_OR_DEPARTMENT_OTHER)
Admission: RE | Admit: 2016-06-25 | Discharge: 2016-06-25 | Disposition: A | Discharge status: Home / Self Care | Payer: BC Managed Care – PPO | Source: Ambulatory Visit | Attending: Orthopedic Surgery | Admitting: Orthopedic Surgery

## 2016-06-25 ENCOUNTER — Encounter (HOSPITAL_BASED_OUTPATIENT_CLINIC_OR_DEPARTMENT_OTHER): Payer: Self-pay | Admitting: *Deleted

## 2016-06-25 ENCOUNTER — Encounter (HOSPITAL_BASED_OUTPATIENT_CLINIC_OR_DEPARTMENT_OTHER)
Admission: RE | Disposition: A | Discharge status: Home / Self Care | Payer: Self-pay | Source: Ambulatory Visit | Attending: Orthopedic Surgery

## 2016-06-25 DIAGNOSIS — F419 Anxiety disorder, unspecified: Secondary | ICD-10-CM | POA: Insufficient documentation

## 2016-06-25 DIAGNOSIS — T8484XA Pain due to internal orthopedic prosthetic devices, implants and grafts, initial encounter: Secondary | ICD-10-CM | POA: Insufficient documentation

## 2016-06-25 DIAGNOSIS — Z87891 Personal history of nicotine dependence: Secondary | ICD-10-CM | POA: Insufficient documentation

## 2016-06-25 DIAGNOSIS — G629 Polyneuropathy, unspecified: Secondary | ICD-10-CM | POA: Diagnosis not present

## 2016-06-25 DIAGNOSIS — Z7982 Long term (current) use of aspirin: Secondary | ICD-10-CM | POA: Diagnosis not present

## 2016-06-25 DIAGNOSIS — M1990 Unspecified osteoarthritis, unspecified site: Secondary | ICD-10-CM | POA: Diagnosis not present

## 2016-06-25 DIAGNOSIS — K219 Gastro-esophageal reflux disease without esophagitis: Secondary | ICD-10-CM | POA: Diagnosis not present

## 2016-06-25 DIAGNOSIS — Z96652 Presence of left artificial knee joint: Secondary | ICD-10-CM | POA: Insufficient documentation

## 2016-06-25 DIAGNOSIS — S90211A Contusion of right great toe with damage to nail, initial encounter: Secondary | ICD-10-CM | POA: Insufficient documentation

## 2016-06-25 DIAGNOSIS — Z96641 Presence of right artificial hip joint: Secondary | ICD-10-CM | POA: Insufficient documentation

## 2016-06-25 DIAGNOSIS — Y831 Surgical operation with implant of artificial internal device as the cause of abnormal reaction of the patient, or of later complication, without mention of misadventure at the time of the procedure: Secondary | ICD-10-CM | POA: Insufficient documentation

## 2016-06-25 DIAGNOSIS — Z9889 Other specified postprocedural states: Secondary | ICD-10-CM

## 2016-06-25 HISTORY — PX: HARDWARE REMOVAL: SHX979

## 2016-06-25 SURGERY — REMOVAL, HARDWARE
Anesthesia: General | Site: Foot | Laterality: Right

## 2016-06-25 MED ORDER — GLYCOPYRROLATE 0.2 MG/ML IJ SOLN: 0.2000 mg | Freq: Once | INTRAMUSCULAR | Status: DC | PRN

## 2016-06-25 MED ORDER — OXYCODONE HCL 5 MG PO TABS
5.0000 mg | ORAL_TABLET | Freq: Once | ORAL | Status: AC | PRN
  Administered 2016-06-25: 5 mg via ORAL

## 2016-06-25 MED ORDER — FENTANYL CITRATE (PF) 100 MCG/2ML IJ SOLN
INTRAMUSCULAR | Status: AC
  Filled 2016-06-25: qty 2

## 2016-06-25 MED ORDER — SENNA 8.6 MG PO TABS: 2.0000 | ORAL_TABLET | Freq: Two times a day (BID) | ORAL | 0 refills | Status: DC

## 2016-06-25 MED ORDER — ONDANSETRON HCL 4 MG/2ML IJ SOLN
INTRAMUSCULAR | Status: DC | PRN
  Administered 2016-06-25: 4 mg via INTRAVENOUS

## 2016-06-25 MED ORDER — OXYCODONE HCL 5 MG/5ML PO SOLN: 5.0000 mg | Freq: Once | ORAL | Status: AC | PRN

## 2016-06-25 MED ORDER — HYDROMORPHONE HCL 1 MG/ML IJ SOLN
0.2500 mg | INTRAMUSCULAR | Status: DC | PRN
  Administered 2016-06-25: 0.5 mg via INTRAVENOUS

## 2016-06-25 MED ORDER — MIDAZOLAM HCL 2 MG/2ML IJ SOLN
INTRAMUSCULAR | Status: AC
  Filled 2016-06-25: qty 2

## 2016-06-25 MED ORDER — SCOPOLAMINE 1 MG/3DAYS TD PT72: 1.0000 | MEDICATED_PATCH | Freq: Once | TRANSDERMAL | Status: DC | PRN

## 2016-06-25 MED ORDER — CEFAZOLIN SODIUM-DEXTROSE 2-4 GM/100ML-% IV SOLN
INTRAVENOUS | Status: AC
  Filled 2016-06-25: qty 100

## 2016-06-25 MED ORDER — DOCUSATE SODIUM 100 MG PO CAPS: 100.0000 mg | ORAL_CAPSULE | Freq: Two times a day (BID) | ORAL | 0 refills | Status: DC

## 2016-06-25 MED ORDER — LIDOCAINE HCL (CARDIAC) 20 MG/ML IV SOLN
INTRAVENOUS | Status: DC | PRN
  Administered 2016-06-25: 60 mg via INTRAVENOUS

## 2016-06-25 MED ORDER — CEFAZOLIN SODIUM-DEXTROSE 2-4 GM/100ML-% IV SOLN
2.0000 g | INTRAVENOUS | Status: AC
  Administered 2016-06-25: 2 g via INTRAVENOUS

## 2016-06-25 MED ORDER — 0.9 % SODIUM CHLORIDE (POUR BTL) OPTIME
TOPICAL | Status: DC | PRN
  Administered 2016-06-25: 200 mL

## 2016-06-25 MED ORDER — PROMETHAZINE HCL 25 MG/ML IJ SOLN: 6.2500 mg | INTRAMUSCULAR | Status: DC | PRN

## 2016-06-25 MED ORDER — OXYCODONE HCL 5 MG PO TABS
ORAL_TABLET | ORAL | Status: AC
  Filled 2016-06-25: qty 1

## 2016-06-25 MED ORDER — CHLORHEXIDINE GLUCONATE 4 % EX LIQD: 60.0000 mL | Freq: Once | CUTANEOUS | Status: DC

## 2016-06-25 MED ORDER — HYDROCODONE-ACETAMINOPHEN 5-325 MG PO TABS: 1.0000 | ORAL_TABLET | ORAL | 0 refills | Status: DC | PRN

## 2016-06-25 MED ORDER — HYDROMORPHONE HCL 1 MG/ML IJ SOLN
INTRAMUSCULAR | Status: AC
  Filled 2016-06-25: qty 1

## 2016-06-25 MED ORDER — LACTATED RINGERS IV SOLN
INTRAVENOUS | Status: DC
  Administered 2016-06-25 (×2): via INTRAVENOUS

## 2016-06-25 MED ORDER — FENTANYL CITRATE (PF) 100 MCG/2ML IJ SOLN
50.0000 ug | INTRAMUSCULAR | Status: DC | PRN
  Administered 2016-06-25: 100 ug via INTRAVENOUS
  Administered 2016-06-25: 50 ug via INTRAVENOUS

## 2016-06-25 MED ORDER — BUPIVACAINE-EPINEPHRINE 0.5% -1:200000 IJ SOLN
INTRAMUSCULAR | Status: DC | PRN
  Administered 2016-06-25: 25 mL

## 2016-06-25 MED ORDER — PROPOFOL 10 MG/ML IV BOLUS
INTRAVENOUS | Status: DC | PRN
  Administered 2016-06-25: 200 mg via INTRAVENOUS

## 2016-06-25 MED ORDER — SODIUM CHLORIDE 0.9 % IV SOLN: INTRAVENOUS | Status: DC

## 2016-06-25 MED ORDER — MEPERIDINE HCL 25 MG/ML IJ SOLN: 6.2500 mg | INTRAMUSCULAR | Status: DC | PRN

## 2016-06-25 MED ORDER — MIDAZOLAM HCL 2 MG/2ML IJ SOLN
1.0000 mg | INTRAMUSCULAR | Status: DC | PRN
Start: 2016-06-25 — End: 2016-06-25
  Administered 2016-06-25: 2 mg via INTRAVENOUS

## 2016-06-25 MED ORDER — DEXAMETHASONE SODIUM PHOSPHATE 10 MG/ML IJ SOLN
INTRAMUSCULAR | Status: DC | PRN
  Administered 2016-06-25: 10 mg via INTRAVENOUS

## 2016-06-25 SURGICAL SUPPLY — 74 items
APL SKNCLS STERI-STRIP NONHPOA (GAUZE/BANDAGES/DRESSINGS)
BANDAGE ACE 4X5 VEL STRL LF (GAUZE/BANDAGES/DRESSINGS) IMPLANT
BANDAGE ESMARK 6X9 LF (GAUZE/BANDAGES/DRESSINGS) IMPLANT
BENZOIN TINCTURE PRP APPL 2/3 (GAUZE/BANDAGES/DRESSINGS) IMPLANT
BLADE SURG 15 STRL LF DISP TIS (BLADE) ×2 IMPLANT
BLADE SURG 15 STRL SS (BLADE) ×6
BNDG CMPR 9X4 STRL LF SNTH (GAUZE/BANDAGES/DRESSINGS)
BNDG CMPR 9X6 STRL LF SNTH (GAUZE/BANDAGES/DRESSINGS) ×1
BNDG COHESIVE 4X5 TAN STRL (GAUZE/BANDAGES/DRESSINGS) ×2 IMPLANT
BNDG COHESIVE 6X5 TAN STRL LF (GAUZE/BANDAGES/DRESSINGS) IMPLANT
BNDG ESMARK 4X9 LF (GAUZE/BANDAGES/DRESSINGS) IMPLANT
BNDG ESMARK 6X9 LF (GAUZE/BANDAGES/DRESSINGS) ×3
CHLORAPREP W/TINT 26ML (MISCELLANEOUS) ×3 IMPLANT
CLOSURE WOUND 1/2 X4 (GAUZE/BANDAGES/DRESSINGS)
COVER BACK TABLE 60X90IN (DRAPES) ×3 IMPLANT
CUFF TOURNIQUET SINGLE 18IN (TOURNIQUET CUFF) ×2 IMPLANT
CUFF TOURNIQUET SINGLE 34IN LL (TOURNIQUET CUFF) IMPLANT
DECANTER SPIKE VIAL GLASS SM (MISCELLANEOUS) IMPLANT
DRAPE EXTREMITY T 121X128X90 (DRAPE) ×3 IMPLANT
DRAPE OEC MINIVIEW 54X84 (DRAPES) ×2 IMPLANT
DRAPE SURG 17X23 STRL (DRAPES) IMPLANT
DRAPE U-SHAPE 47X51 STRL (DRAPES) ×3 IMPLANT
DRSG MEPITEL 4X7.2 (GAUZE/BANDAGES/DRESSINGS) ×3 IMPLANT
DRSG PAD ABDOMINAL 8X10 ST (GAUZE/BANDAGES/DRESSINGS) IMPLANT
ELECT REM PT RETURN 9FT ADLT (ELECTROSURGICAL) ×3
ELECTRODE REM PT RTRN 9FT ADLT (ELECTROSURGICAL) ×1 IMPLANT
GAUZE SPONGE 4X4 12PLY STRL (GAUZE/BANDAGES/DRESSINGS) ×3 IMPLANT
GLOVE BIO SURGEON STRL SZ7 (GLOVE) ×2 IMPLANT
GLOVE BIO SURGEON STRL SZ8 (GLOVE) ×3 IMPLANT
GLOVE BIOGEL PI IND STRL 7.0 (GLOVE) IMPLANT
GLOVE BIOGEL PI IND STRL 7.5 (GLOVE) IMPLANT
GLOVE BIOGEL PI IND STRL 8 (GLOVE) ×2 IMPLANT
GLOVE BIOGEL PI INDICATOR 7.0 (GLOVE) ×4
GLOVE BIOGEL PI INDICATOR 7.5 (GLOVE) ×2
GLOVE BIOGEL PI INDICATOR 8 (GLOVE) ×4
GLOVE ECLIPSE 6.5 STRL STRAW (GLOVE) ×2 IMPLANT
GLOVE ECLIPSE 7.5 STRL STRAW (GLOVE) ×3 IMPLANT
GLOVE EXAM NITRILE MD LF STRL (GLOVE) IMPLANT
GOWN STRL REUS W/ TWL LRG LVL3 (GOWN DISPOSABLE) ×1 IMPLANT
GOWN STRL REUS W/ TWL XL LVL3 (GOWN DISPOSABLE) ×2 IMPLANT
GOWN STRL REUS W/TWL LRG LVL3 (GOWN DISPOSABLE) ×6
GOWN STRL REUS W/TWL XL LVL3 (GOWN DISPOSABLE) ×6
NEEDLE HYPO 22GX1.5 SAFETY (NEEDLE) ×2 IMPLANT
PACK BASIN DAY SURGERY FS (CUSTOM PROCEDURE TRAY) ×3 IMPLANT
PAD CAST 4YDX4 CTTN HI CHSV (CAST SUPPLIES) ×1 IMPLANT
PADDING CAST ABS 4INX4YD NS (CAST SUPPLIES)
PADDING CAST ABS COTTON 4X4 ST (CAST SUPPLIES) IMPLANT
PADDING CAST COTTON 4X4 STRL (CAST SUPPLIES) ×3
PADDING CAST COTTON 6X4 STRL (CAST SUPPLIES) IMPLANT
PENCIL BUTTON HOLSTER BLD 10FT (ELECTRODE) ×2 IMPLANT
PIN GUIDE DRILL TIP 2.8X300 (DRILL) ×2 IMPLANT
SANITIZER HAND PURELL 535ML FO (MISCELLANEOUS) ×3 IMPLANT
SHEET MEDIUM DRAPE 40X70 STRL (DRAPES) ×3 IMPLANT
SLEEVE SCD COMPRESS KNEE MED (MISCELLANEOUS) ×3 IMPLANT
SPLINT FAST PLASTER 5X30 (CAST SUPPLIES)
SPLINT PLASTER CAST FAST 5X30 (CAST SUPPLIES) IMPLANT
SPONGE LAP 18X18 X RAY DECT (DISPOSABLE) ×3 IMPLANT
SPONGE SURGIFOAM ABS GEL 12-7 (HEMOSTASIS) IMPLANT
STOCKINETTE 6  STRL (DRAPES) ×2
STOCKINETTE 6 STRL (DRAPES) ×1 IMPLANT
STRIP CLOSURE SKIN 1/2X4 (GAUZE/BANDAGES/DRESSINGS) IMPLANT
SUCTION FRAZIER HANDLE 10FR (MISCELLANEOUS) ×2
SUCTION TUBE FRAZIER 10FR DISP (MISCELLANEOUS) IMPLANT
SUT ETHILON 3 0 PS 1 (SUTURE) ×2 IMPLANT
SUT MNCRL AB 3-0 PS2 18 (SUTURE) ×3 IMPLANT
SUT VIC AB 0 SH 27 (SUTURE) IMPLANT
SUT VIC AB 2-0 SH 27 (SUTURE)
SUT VIC AB 2-0 SH 27XBRD (SUTURE) IMPLANT
SYR BULB 3OZ (MISCELLANEOUS) ×3 IMPLANT
SYR CONTROL 10ML LL (SYRINGE) ×2 IMPLANT
TOWEL OR 17X24 6PK STRL BLUE (TOWEL DISPOSABLE) ×3 IMPLANT
TUBE CONNECTING 20'X1/4 (TUBING) ×1
TUBE CONNECTING 20X1/4 (TUBING) ×1 IMPLANT
UNDERPAD 30X30 (UNDERPADS AND DIAPERS) ×3 IMPLANT

## 2016-06-25 NOTE — Anesthesia Procedure Notes (Signed)
Procedure Name: LMA Insertion Date/Time: 06/25/2016 10:49 AM Performed by: Travarus Trudo D Pre-anesthesia Checklist: Patient identified, Emergency Drugs available, Suction available and Patient being monitored Patient Re-evaluated:Patient Re-evaluated prior to inductionOxygen Delivery Method: Circle system utilized Preoxygenation: Pre-oxygenation with 100% oxygen Intubation Type: IV induction Ventilation: Mask ventilation without difficulty LMA: LMA inserted LMA Size: 5.0 Number of attempts: 1 Airway Equipment and Method: Bite block Placement Confirmation: positive ETCO2 Tube secured with: Tape Dental Injury: Teeth and Oropharynx as per pre-operative assessment

## 2016-06-25 NOTE — Brief Op Note (Signed)
06/25/2016  11:32 AM  PATIENT:  Craig Scott  60 y.o. male  PRE-OPERATIVE DIAGNOSIS:  painful hardware right foot and right heel  POST-OPERATIVE DIAGNOSIS:   1.  Painful hardware right calcaneus      2.  Painful hardware right medial cuneiform      3.  Right subungual hematoma  Procedure(s): 1.  Removal of deep implants from the right calcaneus   2.  Removal of deep implants from the right medial cuneiform (separate incision)   3.  Removal of right hallux nail plate  SURGEON:  Wylene Simmer, MD  ASSISTANT:  Mechele Claude, PA-C  ANESTHESIA:   General  EBL:  minimal   TOURNIQUET:   Total Tourniquet Time Documented: Calf (Right) - 22 minutes Total: Calf (Right) - 22 minutes  COMPLICATIONS:  None apparent  DISPOSITION:  Extubated, awake and stable to recovery.  DICTATION ID:  [id number]

## 2016-06-25 NOTE — Anesthesia Postprocedure Evaluation (Signed)
Anesthesia Post Note  Patient: Craig Scott  Procedure(s) Performed: Procedure(s) (LRB): REMOVAL OF DEEP IMPLANTS RIGHT MEDIAL CUNEIFORM, RIGHT CALCANEAL REMOVAL OF DEEP IMPLANTS (Right)  Patient location during evaluation: PACU Anesthesia Type: General Level of consciousness: awake and alert Pain management: pain level controlled Vital Signs Assessment: post-procedure vital signs reviewed and stable Respiratory status: spontaneous breathing, nonlabored ventilation and respiratory function stable Cardiovascular status: blood pressure returned to baseline and stable Postop Assessment: no signs of nausea or vomiting Anesthetic complications: no    Last Vitals:  Vitals:   06/25/16 1228 06/25/16 1324  BP:  132/73  Pulse: 63 70  Resp: (!) 7 18  Temp:  36.6 C    Last Pain:  Vitals:   06/25/16 1324  TempSrc:   PainSc: 5                  Brianca Fortenberry A

## 2016-06-25 NOTE — Anesthesia Preprocedure Evaluation (Signed)
Anesthesia Evaluation  Patient identified by MRN, date of birth, ID band Patient awake    Reviewed: Allergy & Precautions, NPO status , Patient's Chart, lab work & pertinent test results  Airway Mallampati: III  TM Distance: >3 FB Neck ROM: Full    Dental  (+) Teeth Intact   Pulmonary former smoker,    breath sounds clear to auscultation       Cardiovascular negative cardio ROS   Rhythm:Regular Rate:Normal     Neuro/Psych Anxiety negative neurological ROS  negative psych ROS   GI/Hepatic Neg liver ROS, GERD  Medicated,  Endo/Other  negative endocrine ROS  Renal/GU negative Renal ROS  negative genitourinary   Musculoskeletal  (+) Arthritis ,   Abdominal   Peds negative pediatric ROS (+)  Hematology negative hematology ROS (+)   Anesthesia Other Findings   Reproductive/Obstetrics negative OB ROS                             Anesthesia Physical  Anesthesia Plan  ASA: II  Anesthesia Plan: General   Post-op Pain Management:    Induction: Intravenous  Airway Management Planned: LMA  Additional Equipment:   Intra-op Plan:   Post-operative Plan: Extubation in OR  Informed Consent: I have reviewed the patients History and Physical, chart, labs and discussed the procedure including the risks, benefits and alternatives for the proposed anesthesia with the patient or authorized representative who has indicated his/her understanding and acceptance.   Dental advisory given  Plan Discussed with: CRNA  Anesthesia Plan Comments: (No block 2/2 previous nerve damage with residual symptoms. )        Anesthesia Quick Evaluation

## 2016-06-25 NOTE — Discharge Instructions (Addendum)
Craig Simmer, MD [place]  Please read the following information regarding your care after surgery.  Medications  You only need a prescription for the narcotic pain medicine (ex. oxycodone, Percocet, Norco).  All of the other medicines listed below are available over the counter. X acetominophen (Tylenol) 650 mg every 4-6 hours as you need for minor pain X hydrocodone as prescribed for moderate to severe pain ?   Narcotic pain medicine (ex. oxycodone, Percocet, Vicodin) will cause constipation.  To prevent this problem, take the following medicines while you are taking any pain medicine. X docusate sodium (Colace) 100 mg twice a day X senna (Senokot) 2 tablets twice a day   Weight Bearing X Bear weight when you are able on your operated leg or foot in the post-op shoe.  Cast / Splint / Dressing X Remove your dressing 3 days after surgery and cover the incisions with dry dressings.    After your dressing, cast or splint is removed; you may shower, but do not soak or scrub the wound.  Allow the water to run over it, and then gently pat it dry.  Swelling It is normal for you to have swelling where you had surgery.  To reduce swelling and pain, keep your toes above your nose for at least 3 days after surgery.  It may be necessary to keep your foot or leg elevated for several weeks.  If it hurts, it should be elevated.  Follow Up Call my office at [phone] when you are discharged from the hospital or surgery center to schedule an appointment to be seen two weeks after surgery.  Call my office at [phone] if you develop a fever >101.5 F, nausea, vomiting, bleeding from the surgical site or severe pain.       Post Anesthesia Home Care Instructions  Activity: Get plenty of rest for the remainder of the day. A responsible adult should stay with you for 24 hours following the procedure.  For the next 24 hours, DO NOT: -Drive a car -Paediatric nurse -Drink  alcoholic beverages -Take any medication unless instructed by your physician -Make any legal decisions or sign important papers.  Meals: Start with liquid foods such as gelatin or soup. Progress to regular foods as tolerated. Avoid greasy, spicy, heavy foods. If nausea and/or vomiting occur, drink only clear liquids until the nausea and/or vomiting subsides. Call your physician if vomiting continues.  Special Instructions/Symptoms: Your throat may feel dry or sore from the anesthesia or the breathing tube placed in your throat during surgery. If this causes discomfort, gargle with warm salt water. The discomfort should disappear within 24 hours.  If you had a scopolamine patch placed behind your ear for the management of post- operative nausea and/or vomiting:  1. The medication in the patch is effective for 72 hours, after which it should be removed.  Wrap patch in a tissue and discard in the trash. Wash hands thoroughly with soap and water. 2. You may remove the patch earlier than 72 hours if you experience unpleasant side effects which may include dry mouth, dizziness or visual disturbances. 3. Avoid touching the patch. Wash your hands with soap and water after contact with the patch.

## 2016-06-25 NOTE — Transfer of Care (Signed)
Immediate Anesthesia Transfer of Care Note  Patient: Craig Scott  Procedure(s) Performed: Procedure(s): REMOVAL OF DEEP IMPLANTS RIGHT MEDIAL CUNEIFORM, RIGHT CALCANEAL REMOVAL OF DEEP IMPLANTS (Right)  Patient Location: PACU  Anesthesia Type:General  Level of Consciousness: awake  Airway & Oxygen Therapy: Patient Spontanous Breathing and Patient connected to face mask oxygen  Post-op Assessment: Report given to RN and Post -op Vital signs reviewed and stable  Post vital signs: Reviewed and stable  Last Vitals:  Vitals:   06/25/16 0846  BP: 132/73  Pulse: 70  Resp: 18  Temp: 36.6 C    Last Pain:  Vitals:   06/25/16 0846  TempSrc: Oral  PainSc: 8          Complications: No apparent anesthesia complications

## 2016-06-25 NOTE — H&P (Signed)
Craig Scott is an 60 y.o. male.   Chief Complaint: painful hardware right foot HPI: 60 y/o male with h/o right foot reconstruction a year ago presents with pain on the dorsum of the right foot and at the right heel.  Xrays show that one of the dorsal foot screws is backing out.  He has healed the osteotomy sites and presents now for removal of the painful deep implants.  Past Medical History:  Diagnosis Date  . Anxiety   . Arthritis    ra  . GERD (gastroesophageal reflux disease)   . High cholesterol   . Lynch syndrome    has gene that causes cancer  . Neuropathy ([place])    both feet  . Tubular adenoma     Past Surgical History:  Procedure Laterality Date  . BUNIONECTOMY WITH HAMMERTOE RECONSTRUCTION AND GASTROC SLIDE Right 06/27/2015   Procedure: RIGHT GASTROC RECESSION/POSTERIOR TIBIAL TENOLYSIS/SECOND AND THIRD METATARSAL WEIL OSTEOTOMY AND HAMMERTOE CORRECTION;  Surgeon: Wylene Simmer, MD;  Location: [facility];  Service: Orthopedics;  Laterality: Right;  . CALCANEAL OSTEOTOMY Right 06/27/2015   Procedure: CALCANEAL OSTEOTOMY;  Surgeon: Wylene Simmer, MD;  Location: [facility];  Service: Orthopedics;  Laterality: Right;  . COLONOSCOPY   12/09/01   RMR: Normal normal rectum/ A few scattered left-sided diverticula.  The remainder of the colonic  mucosa appeared normal  . COLONOSCOPY  12/15/2002   RMR: Normal rectum, scattered left-sided diverticula.  The remainder of the colonic mucosa appeared normal  . COLONOSCOPY  04/10/2004   RMR: Normal rectum/Sigmoid diverticula/The remainder of the colonic mucosa appeared normal  . COLONOSCOPY  12/23/2005   RMR: Normal rectum, scattered sigmoid diverticula Colonic mucosa appeared normal  . COLONOSCOPY    01/20/2008   RMR: Distal diminutive rectal polyps, status post cold biopsy removal  otherwise, normal rectum/ biopsy removal; scattered sigmoid diverticula; and benign-appearing/ulcers about the ileocecal valve,  status post biopsy.  Remainder of colonic mucosa and terminal mucosa appeared normal.  . COLONOSCOPY  05/22/2010   RMR: distal diminutive rectal polyp s/p bx otherwise normal/few scattered pancolonic diverticula  . COLONOSCOPY  09/16/2012   Dr.Rourk- normal rectum, few scattered pancolonic diverticulosis, one diminutive polyp in the base of the cecum. 3x3 area of hyper pigmentation i the mid descending segment, this was a flat benign-appearing area o/w the remainder of the colonic mucosa appeared normal. bx= tubular adenoma and benign colonic mucosa  . COLONOSCOPY WITH PROPOFOL N/A 09/05/2015   Procedure: COLONOSCOPY WITH PROPOFOL;  Surgeon: Daneil Dolin, MD;  [facility];  Service: Endoscopy;  Laterality: N/A;  0830  . ESOPHAGOGASTRODUODENOSCOPY  12/23/2005   RMR:   Esophagogastric peptic stricture with reflux esophagitis, status post  dilation as described above/ Small hiatal hernia, otherwise normal stomach.  Bulbar erosions otherwise normal D1 and D2  . EYE SURGERY  as child  . JOINT REPLACEMENT  09/30/2010   Right knee  . KNEE SURGERY Right    bilat   . NOSE SURGERY     cartiledge removal  . POLYPECTOMY  09/05/2015   Procedure: POLYPECTOMY;  Surgeon: Daneil Dolin, MD;  Location: [facility];  Service: Endoscopy;;  . TOTAL HIP ARTHROPLASTY Left 03/20/2015   Procedure: LEFT TOTAL HIP ARTHROPLASTY ANTERIOR APPROACH;  Surgeon: Gaynelle Arabian, MD;  Location: [facility];  Service: Orthopedics;  Laterality: Left;  . TOTAL KNEE ARTHROPLASTY Left 10/23/2013   Procedure: LEFT TOTAL KNEE ARTHROPLASTY;  Surgeon: [name], MD;  Location: [facility];  Service: Orthopedics;  Laterality: Left;  . TYMPANOSTOMY TUBE PLACEMENT Right    early 2014    Family History  Problem Relation Age of Onset  . Colon cancer Brother     43   . Colon cancer Brother     65  . Colon cancer Mother     age 54   . Colon cancer Cousin     86 years old onset  . Colon cancer Cousin     82 years onset    Social History:  reports that he quit smoking about 10 years ago. His smoking use included Cigars and Cigarettes. He has a 15.00 pack-year smoking history. He has never used smokeless tobacco. He reports that he drinks about 4.2 oz of alcohol per week . He reports that he does not use drugs.  Allergies:  Allergies  Allergen Reactions  . Lyrica [Pregabalin] Other (See Comments)    "changes personality"  . Celebrex [Celecoxib] Rash         Medications Prior to Admission  Medication Sig Dispense Refill  . ALPRAZolam (XANAX) 1 MG tablet Take 1 mg by mouth every 6 (six) hours as needed for anxiety. anxiety    . aspirin EC 325 MG tablet Take 1 tablet (325 mg total) by mouth daily. 42 tablet 0  . cetirizine (ZYRTEC) 10 MG tablet Take 10 mg by mouth daily.    . meloxicam (MOBIC) 7.5 MG tablet Take 7.5 mg by mouth 2 (two) times daily.    . Omega-3 Fatty Acids (FISH OIL PO) Take 1 capsule by mouth daily.    Marland Kitchen omeprazole (PRILOSEC) 20 MG capsule Take 20 mg by mouth daily.    . Potassium 95 MG TABS Take 95 mg by mouth daily.    . simvastatin (ZOCOR) 20 MG tablet Take 20 mg by mouth every morning.     Marland Kitchen tiZANidine (ZANAFLEX) 4 MG tablet Take 4 mg by mouth 3 (three) times daily as needed for muscle spasms.       No results found for this or any previous visit (from the past 48 hour(s)). No results found.  ROS  No recent f/c/n/v/wt loss  Blood pressure 132/73, pulse 70, temperature 97.9 F (36.6 C), temperature source Oral, resp. rate 18, height 6' (1.829 m), weight 95.3 kg (210 lb 3.2 oz), SpO2 98 %. Physical Exam  wn wd man in nad.  A and O x 4.  Mood and affect normal.  EOMI.  resp unlabored.  R foot with healed incisions.  TTP at dorsal incision with firm mass.  TTP at heel incision.  No signs of infection.  5/5 strength in PF and DF of the ankle and toes.  No lymphadenopathy.  sens to LT intact at the forefoot.  Assessment/Plan R foot painful hardware.  To OR for removal of deep  implants at the dorsal midfoot and heel.  The risks and benefits of the alternative treatment options have been discussed in detail.  The patient wishes to proceed with surgery and specifically understands risks of bleeding, infection, nerve damage, blood clots, need for additional surgery, amputation and death.   Wylene Simmer, MD 06-26-16, 10:25 AM

## 2016-06-26 ENCOUNTER — Encounter (HOSPITAL_BASED_OUTPATIENT_CLINIC_OR_DEPARTMENT_OTHER): Payer: Self-pay | Admitting: Orthopedic Surgery

## 2016-06-26 NOTE — Op Note (Signed)
NAME:  Craig Scott, Craig Scott NO.:  MEDICAL RECORD NO.:  [id number]  LOCATION:                                 FACILITY:  PHYSICIAN:  Wylene Simmer, MD        DATE OF BIRTH:  [birth year]  DATE OF PROCEDURE:  06/25/2016 DATE OF DISCHARGE:                              OPERATIVE REPORT   PREOPERATIVE DIAGNOSES: 1. Painful hardware, right calcaneus. 2. Painful hardware, right medial cuneiform.  POSTOPERATIVE DIAGNOSES: 1. Painful hardware, right calcaneus. 2. Painful hardware, right medial cuneiform. 3. Right hallux subungual hematoma.  PROCEDURES: 1. Removal of deep implants from the right calcaneus. 2. Removal of deep implants from the right medial cuneiform through a     separate incision. 3. Removal of right hallux nail plate.  SURGEON:  Wylene Simmer, MD  ASSISTANT:  Mechele Claude, PA-C.  ANESTHESIA:  General.  ESTIMATED BLOOD LOSS:  Minimal.  TOURNIQUET TIME:  22 minutes at 250 mmHg.  COMPLICATIONS:  None apparent.  DISPOSITION:  Extubated, awake, and stable to recovery.  INDICATIONS FOR PROCEDURE:  The patient is a 60 year old male, who is now 1 year status post right flatfoot reconstruction.  He has pain in his dorsal medial midfoot as well as his right heel.  Both of these locations are overlying deep implants.  He incidentally notes today painful bruise beneath the big toenail of his right foot.  He kicked a foot stool at his house about a week ago.  The nail is loose from the nail bed.  He presents today for removal of the deep implants, and we will plan to remove that loose toenail for him as well.  He understands the risks and benefits of the alternative treatment options, and elects surgical treatment.  He specifically understands risks of bleeding, infection, nerve damage, blood clots, need for additional surgery, continued pain, amputation, and death.  PROCEDURE IN DETAIL:  After preoperative consent was obtained, the correct operative  site was identified, the patient was brought to the operating room and placed supine on the operating table.  General anesthesia was induced.  Preoperative antibiotics were administered. Surgical time-out was taken.  The right lower extremity was prepped and draped in standard sterile fashion.  A calf tourniquet was then inflated to 200 mmHg.  The patient's previous heel incision was identified.  It was opened again sharply and dissection was carried down through subcutaneous tissues.  A guide pin was inserted into the more lateral of the 2 screws using fluoroscopic guidance.  The lateral screw was then removed in its entirety.  The more medial screw was then identified and again the guide pin inserted appropriately.  The screwdriver was used to remove the screw in its entirety.  Wound was then irrigated copiously and closed with nylon.  Subcutaneous tissues were infiltrated with 0.5% Marcaine with epinephrine. Attention was then turned to the dorsum of the midfoot where the previous incision was opened again sharply.  Dissection was carried down through skin and subcutaneous tissues.  The 2 small frag screws were identified.  These were cleaned of all soft tissue and removed.  The 1/3rd tubular plate was also removed.  The BioFoam wedge was noted to be in its appropriate position and appeared stable.  The wound was then irrigated copiously.  Subcutaneous tissues were approximated with 3-0 Monocryl.  Skin incision was closed with 3-0 nylon.  Subcutaneous tissues were again infiltrated with 0.5% Marcaine with epinephrine.  The hallux nail was then examined.  It was noted to be completely detached from the underlying nail bed.  It was freed from the surrounding eponychial fold with a Soil scientist and removed in its entirety.  The nail bed was irrigated.  Sterile dressings were applied to all 3 operative sites followed by compression wrap.  Tourniquet was released after application of the  dressings at 22 minutes.  A digital block had been performed with 0.5% Marcaine with epinephrine for the right hallux nail removal.  The patient was then awakened from anesthesia and transported to the recovery room in stable condition.  FOLLOWUP PLAN:  The patient will be weightbearing as tolerated on his right foot in a flat postop shoe.  He can remove those dressings in 3 days and shower.  RADIOGRAPHS:  AP and lateral radiographs of the right foot were obtained intraoperatively.  These show interval removal of hardware from the calcaneus and the medial cuneiform.  No other acute injuries are noted.  Mechele Claude, PA-C was present and scrubbed through the duration of the case.  His assistance was essential in positioning the patient, prepping and draping, gaining and maintaining exposure, performing the operation, closing and dressing the wounds.     Wylene Simmer, MD     JH/MEDQ  D:  06/25/2016  T:  06/26/2016  Job:  [id number]

## 2017-01-18 ENCOUNTER — Encounter: Payer: Self-pay | Admitting: Internal Medicine

## 2017-04-14 ENCOUNTER — Other Ambulatory Visit: Payer: Self-pay | Admitting: Orthopedic Surgery

## 2017-04-29 ENCOUNTER — Other Ambulatory Visit: Payer: Self-pay

## 2017-04-29 ENCOUNTER — Encounter: Payer: Self-pay | Admitting: Gastroenterology

## 2017-04-29 ENCOUNTER — Ambulatory Visit (INDEPENDENT_AMBULATORY_CARE_PROVIDER_SITE_OTHER): Payer: BC Managed Care – PPO | Admitting: Gastroenterology

## 2017-04-29 VITALS — BP 137/82 | HR 81 | Temp 97.6°F | Ht 72.0 in | Wt 234.2 lb

## 2017-04-29 DIAGNOSIS — R160 Hepatomegaly, not elsewhere classified: Secondary | ICD-10-CM | POA: Diagnosis not present

## 2017-04-29 DIAGNOSIS — Z86010 Personal history of colonic polyps: Secondary | ICD-10-CM

## 2017-04-29 DIAGNOSIS — Z1509 Genetic susceptibility to other malignant neoplasm: Secondary | ICD-10-CM

## 2017-04-29 DIAGNOSIS — K219 Gastro-esophageal reflux disease without esophagitis: Secondary | ICD-10-CM | POA: Diagnosis not present

## 2017-04-29 MED ORDER — CLENPIQ 10-3.5-12 MG-GM -GM/160ML PO SOLN: 1.0000 | Freq: Once | ORAL | 1 refills | Status: AC

## 2017-04-29 NOTE — Patient Instructions (Signed)
1. Please have your labs done at [facility] to check for hepatitis.  2. Dexilant samples provided, take once daily as needed for heartburn.  3. Colonoscopy as scheduled. See separate instructions.  4. You need to cut way back on your alcohol use. Recommend no more than two 12 ounce beers daily. Ultimate goal of quitting.

## 2017-04-29 NOTE — Progress Notes (Signed)
Primary Care Physician:  Fusco, Lawrence, MD  Primary Gastroenterologist:  Michael Rourk, MD   Chief Complaint  Patient presents with  . Colonoscopy    HPI:  Craig Scott is a 61 y.o. male with history of Lynch syndrome presenting to schedule surveillance colonoscopy. Last colonoscopy December 2016, sigmoid tubular adenoma removed, benign cecal polyp, rare diverticula. Advised come back in 18 months for surveillance colonoscopy. His last EGD 2007. He had a peptic stricture with reflux esophagitis status post dilation, bulbar erosions, small hiatal hernia.  Clinically he does well. Request Zegerid samples to use as needed. Takes PPI on occasion. Denies dysphagia, vomiting, abdominal pain. He has had some change in bowel habits, used to have a bowel movement twice daily but now every other day or so. No melena rectal bleeding. No unintentional weight loss. Continues to consume alcohol daily, several beers every evening.   Current Outpatient Prescriptions  Medication Sig Dispense Refill  . ALPRAZolam (XANAX) 1 MG tablet Take 1 mg by mouth every 6 (six) hours as needed for anxiety. anxiety    . aspirin EC 81 MG tablet Take 81 mg by mouth daily.    . Omega-3 Fatty Acids (FISH OIL PO) Take 1 capsule by mouth daily.    . Potassium 95 MG TABS Take 95 mg by mouth daily.    . simvastatin (ZOCOR) 20 MG tablet Take 20 mg by mouth every morning.      No current facility-administered medications for this visit.     Allergies as of 04/29/2017 - Review Complete 04/29/2017  Allergen Reaction Noted  . Lyrica [pregabalin] Other (See Comments) 03/14/2015  . Celebrex [celecoxib] Rash 01/12/2013    Past Medical History:  Diagnosis Date  . Anxiety   . Arthritis    ra  . GERD (gastroesophageal reflux disease)   . High cholesterol   . Lynch syndrome    has gene that causes cancer  . Neuropathy    both feet  . Tubular adenoma     Past Surgical History:  Procedure Laterality Date  .  BUNIONECTOMY WITH HAMMERTOE RECONSTRUCTION AND GASTROC SLIDE Right 06/27/2015   Procedure: RIGHT GASTROC RECESSION/POSTERIOR TIBIAL TENOLYSIS/SECOND AND THIRD METATARSAL WEIL OSTEOTOMY AND HAMMERTOE CORRECTION;  Surgeon: John Hewitt, MD;  Location: [facility];  Service: Orthopedics;  Laterality: Right;  . CALCANEAL OSTEOTOMY Right 06/27/2015   Procedure: CALCANEAL OSTEOTOMY;  Surgeon: John Hewitt, MD;  Location: [facility];  Service: Orthopedics;  Laterality: Right;  . COLONOSCOPY   12/09/01   RMR: Normal normal rectum/ A few scattered left-sided diverticula.  The remainder of the colonic  mucosa appeared normal  . COLONOSCOPY  12/15/2002   RMR: Normal rectum, scattered left-sided diverticula.  The remainder of the colonic mucosa appeared normal  . COLONOSCOPY  04/10/2004   RMR: Normal rectum/Sigmoid diverticula/The remainder of the colonic mucosa appeared normal  . COLONOSCOPY  12/23/2005   RMR: Normal rectum, scattered sigmoid diverticula Colonic mucosa appeared normal  . COLONOSCOPY    01/20/2008   RMR: Distal diminutive rectal polyps, status post cold biopsy removal  otherwise, normal rectum/ biopsy removal; scattered sigmoid diverticula; and benign-appearing/ulcers about the ileocecal valve, status post biopsy.  Remainder of colonic mucosa and terminal mucosa appeared normal.  . COLONOSCOPY  05/22/2010   RMR: distal diminutive rectal polyp s/p bx otherwise normal/few scattered pancolonic diverticula  . COLONOSCOPY  09/16/2012   Dr.Rourk- normal rectum, few scattered pancolonic diverticulosis, one diminutive polyp in the base of the cecum. 3x3 area   of hyper pigmentation i the mid descending segment, this was a flat benign-appearing area o/w the remainder of the colonic mucosa appeared normal. bx= tubular adenoma and benign colonic mucosa  . COLONOSCOPY WITH PROPOFOL N/A 09/05/2015   Procedure: COLONOSCOPY WITH PROPOFOL;  Surgeon: Jaivyn M Rourk, MD;  Location: [facility];  Service: Endoscopy;  Laterality: N/A;  0830  . ESOPHAGOGASTRODUODENOSCOPY  12/23/2005   RMR:   Esophagogastric peptic stricture with reflux esophagitis, status post  dilation as described above/ Small hiatal hernia, otherwise normal stomach.  Bulbar erosions otherwise normal D1 and D2  . EYE SURGERY  as child  . HARDWARE REMOVAL Right 06/25/2016   Procedure: REMOVAL OF DEEP IMPLANTS RIGHT MEDIAL CUNEIFORM, RIGHT CALCANEAL REMOVAL OF DEEP IMPLANTS;  Surgeon: John Hewitt, MD;  Location: [facility];  Service: Orthopedics;  Laterality: Right;  . JOINT REPLACEMENT  09/30/2010   Right knee  . KNEE SURGERY Right    bilat   . NOSE SURGERY     cartiledge removal  . POLYPECTOMY  09/05/2015   Procedure: POLYPECTOMY;  Surgeon: Travanti M Rourk, MD;  Location: [facility];  Service: Endoscopy;;  . TOTAL HIP ARTHROPLASTY Left 03/20/2015   Procedure: LEFT TOTAL HIP ARTHROPLASTY ANTERIOR APPROACH;  Surgeon: Frank Aluisio, MD;  Location: [facility];  Service: Orthopedics;  Laterality: Left;  . TOTAL KNEE ARTHROPLASTY Left 10/23/2013   Procedure: LEFT TOTAL KNEE ARTHROPLASTY;  Surgeon: Frank V Aluisio, MD;  Location: [facility];  Service: Orthopedics;  Laterality: Left;  . TYMPANOSTOMY TUBE PLACEMENT Right    early 2014    Family History  Problem Relation Age of Onset  . Colon cancer Brother        49   . Colon cancer Brother        59  . Colon cancer Mother        age 46   . Colon cancer Cousin        27 years old onset  . Colon cancer Cousin        49 years onset    Social History   Social History  . Marital status: Married    Spouse name: N/A  . Number of children: 2  . Years of education: 12   Occupational History  . [employer]    [place]   Social History Main Topics  . Smoking status: Former Smoker    Packs/day: 0.50    Years: 30.00    Types: Cigars, Cigarettes    Quit date: 09/14/2005  . Smokeless tobacco: Never Used     Comment: quit 4-5 years ago   . Alcohol use Yes     Comment: daily, multiple beers in the evening   . Drug use: No     Comment: past use of marijuana- last use 10 years ago.  . Sexual activity: No   Other Topics Concern  . Not on file   Social History Narrative  . No narrative on file      ROS:  General: Negative for anorexia, weight loss, fever, chills, fatigue, weakness. Eyes: Negative for vision changes.  ENT: Negative for hoarseness, difficulty swallowing , nasal congestion. CV: Negative for chest pain, angina, palpitations, dyspnea on exertion, peripheral edema.  Respiratory: Negative for dyspnea at rest, dyspnea on exertion, cough, sputum, wheezing.  GI: See history of present illness. GU:  Negative for dysuria, hematuria, urinary incontinence, urinary frequency, nocturnal urination.  MS: Negative for low back pain. Positive right ankle   pain, multiple surgeries Derm: Negative for rash or itching.  Neuro: Negative for weakness, abnormal sensation, seizure, frequent headaches, memory loss, confusion.  Psych: Negative for anxiety, depression, suicidal ideation, hallucinations.  Endo: Negative for unusual weight change.  Heme: Negative for bruising or bleeding. Allergy: Negative for rash or hives.    Physical Examination:  BP 137/82   Pulse 81   Temp 97.6 F (36.4 C) (Oral)   Ht 6' (1.829 m)   Wt 234 lb 3.2 oz (106.2 kg)   BMI 31.76 kg/m    General: Well-nourished, well-developed in no acute distress.  Head: Normocephalic, atraumatic.   Eyes: Conjunctiva pink, no icterus. Mouth: Oropharyngeal mucosa moist and pink , no lesions erythema or exudate. Neck: Supple without thyromegaly, masses, or lymphadenopathy.  Lungs: Clear to auscultation bilaterally.  Heart: Regular rate and rhythm, no murmurs rubs or gallops.  Abdomen: Bowel sounds are normal, nontender, nondistended, no hepatosplenomegaly or masses, no abdominal bruits or    hernia , no rebound or guarding.   Rectal: Deferred Extremities:  No lower extremity edema. No clubbing or deformities.  Neuro: Alert and oriented x 4 , grossly normal neurologically.  Skin: Warm and dry, no rash or jaundice.   Psych: Alert and cooperative, normal mood and affect.  Imaging Studies: No results found.   

## 2017-04-30 ENCOUNTER — Encounter: Payer: Self-pay | Admitting: Gastroenterology

## 2017-04-30 NOTE — Assessment & Plan Note (Signed)
Continue significant alcohol intake on a daily basis. Discussed that hepatomegaly can be an early sign of liver disease related to his alcohol use. We will update labs today. He requests being checked for viral hepatitis.

## 2017-04-30 NOTE — Assessment & Plan Note (Addendum)
Due for surveillance colonoscopy at this time. Plan for deep sedation given failed conscious sedation in the past and continued alcohol use.  I have discussed the risks, alternatives, benefits with regards to but not limited to the risk of reaction to medication, bleeding, infection, perforation and the patient is agreeable to proceed. Written consent to be obtained.   Patient should begin undergoing screening for urinary tract cancers and skin cancers yearly. With regards to surveillance EGD, patient had initial EGD in 2007 without evidence of H pylori, no gastric biopsies obtained given normal appearance, no indicators of high risk for gastric malignancy therefore EGD not required at this time. To discuss further with Dr. Gala Romney to make sure we're in agreement with guidelines.

## 2017-04-30 NOTE — Assessment & Plan Note (Signed)
Intermittent reflux. Requesting samples, Dexilant provided. Discussed antireflux measures.

## 2017-05-03 NOTE — Progress Notes (Signed)
cc'ed to pcp °

## 2017-05-12 NOTE — Patient Instructions (Signed)
Craig Scott  05/12/2017     @PREFPERIOPPHARMACY @   Your procedure is scheduled on  05/24/2017 .  Report to [facility] at  615  A.M.  Call this number if you have problems the morning of surgery:  [phone]   Remember:  Do not eat food or drink liquids after midnight.  Take these medicines the morning of surgery with A SIP OF WATER  Xanax.   Do not wear jewelry, make-up or nail polish.  Do not wear lotions, powders, or perfumes, or deoderant.  Do not shave 48 hours prior to surgery.  Men may shave face and neck.  Do not bring valuables to the hospital.  [facility] is not responsible for any belongings or valuables.  Contacts, dentures or bridgework may not be worn into surgery.  Leave your suitcase in the car.  After surgery it may be brought to your room.  For patients admitted to the hospital, discharge time will be determined by your treatment team.  Patients discharged the day of surgery will not be allowed to drive home.   Name and phone number of your driver:   family Special instructions:  Follow the diet and prep instructions given to you by Dr Roseanne Kaufman office.  Please read over the following fact sheets that you were given. Anesthesia Post-op Instructions and Care and Recovery After Surgery       Colonoscopy, Adult A colonoscopy is an exam to look at the entire large intestine. During the exam, a lubricated, bendable tube is inserted into the anus and then passed into the rectum, colon, and other parts of the large intestine. A colonoscopy is often done as a part of normal colorectal screening or in response to certain symptoms, such as anemia, persistent diarrhea, abdominal pain, and blood in the stool. The exam can help screen for and diagnose medical problems, including:  Tumors.  Polyps.  Inflammation.  Areas of bleeding.  Tell a health care provider about:  Any allergies you have.  All medicines you are taking, including  vitamins, herbs, eye drops, creams, and over-the-counter medicines.  Any problems you or family members have had with anesthetic medicines.  Any blood disorders you have.  Any surgeries you have had.  Any medical conditions you have.  Any problems you have had passing stool. What are the risks? Generally, this is a safe procedure. However, problems may occur, including:  Bleeding.  A tear in the intestine.  A reaction to medicines given during the exam.  Infection (rare).  What happens before the procedure? Eating and drinking restrictions Follow instructions from your health care provider about eating and drinking, which may include:  A few days before the procedure - follow a low-fiber diet. Avoid nuts, seeds, dried fruit, raw fruits, and vegetables.  1-3 days before the procedure - follow a clear liquid diet. Drink only clear liquids, such as clear broth or bouillon, black coffee or tea, clear juice, clear soft drinks or sports drinks, gelatin dessert, and popsicles. Avoid any liquids that contain red or purple dye.  On the day of the procedure - do not eat or drink anything during the 2 hours before the procedure, or within the time period that your health care provider recommends.  Bowel prep If you were prescribed an oral bowel prep to clean out your colon:  Take it as told by your health care provider. Starting the day before your procedure, you will  need to drink a large amount of medicated liquid. The liquid will cause you to have multiple loose stools until your stool is almost clear or light green.  If your skin or anus gets irritated from diarrhea, you may use these to relieve the irritation: ? Medicated wipes, such as adult wet wipes with aloe and vitamin E. ? A skin soothing-product like petroleum jelly.  If you vomit while drinking the bowel prep, take a break for up to 60 minutes and then begin the bowel prep again. If vomiting continues and you cannot take  the bowel prep without vomiting, call your health care provider.  General instructions  Ask your health care provider about changing or stopping your regular medicines. This is especially important if you are taking diabetes medicines or blood thinners.  Plan to have someone take you home from the hospital or clinic. What happens during the procedure?  An IV tube may be inserted into one of your veins.  You will be given medicine to help you relax (sedative).  To reduce your risk of infection: ? Your health care team will wash or sanitize their hands. ? Your anal area will be washed with soap.  You will be asked to lie on your side with your knees bent.  Your health care provider will lubricate a long, thin, flexible tube. The tube will have a camera and a light on the end.  The tube will be inserted into your anus.  The tube will be gently eased through your rectum and colon.  Air will be delivered into your colon to keep it open. You may feel some pressure or cramping.  The camera will be used to take images during the procedure.  A small tissue sample may be removed from your body to be examined under a microscope (biopsy). If any potential problems are found, the tissue will be sent to a lab for testing.  If small polyps are found, your health care provider may remove them and have them checked for cancer cells.  The tube that was inserted into your anus will be slowly removed. The procedure may vary among health care providers and hospitals. What happens after the procedure?  Your blood pressure, heart rate, breathing rate, and blood oxygen level will be monitored until the medicines you were given have worn off.  Do not drive for 24 hours after the exam.  You may have a small amount of blood in your stool.  You may pass gas and have mild abdominal cramping or bloating due to the air that was used to inflate your colon during the exam.  It is up to you to get the  results of your procedure. Ask your health care provider, or the department performing the procedure, when your results will be ready. This information is not intended to replace advice given to you by your health care provider. Make sure you discuss any questions you have with your health care provider. Document Released: 08/28/2000 Document Revised: 07/01/2016 Document Reviewed: 11/12/2015 Elsevier Interactive Patient Education  2018 [employer].  Colonoscopy, Adult, Care After This sheet gives you information about how to care for yourself after your procedure. Your health care provider may also give you more specific instructions. If you have problems or questions, contact your health care provider. What can I expect after the procedure? After the procedure, it is common to have:  A small amount of blood in your stool for 24 hours after the procedure.  Some gas.  Mild abdominal cramping or bloating.  Follow these instructions at home: General instructions   For the first 24 hours after the procedure: ? Do not drive or use machinery. ? Do not sign important documents. ? Do not drink alcohol. ? Do your regular daily activities at a slower pace than normal. ? Eat soft, easy-to-digest foods. ? Rest often.  Take over-the-counter or prescription medicines only as told by your health care provider.  It is up to you to get the results of your procedure. Ask your health care provider, or the department performing the procedure, when your results will be ready. Relieving cramping and bloating  Try walking around when you have cramps or feel bloated.  Apply heat to your abdomen as told by your health care provider. Use a heat source that your health care provider recommends, such as a moist heat pack or a heating pad. ? Place a towel between your skin and the heat source. ? Leave the heat on for 20-30 minutes. ? Remove the heat if your skin turns bright red. This is especially  important if you are unable to feel pain, heat, or cold. You may have a greater risk of getting burned. Eating and drinking  Drink enough fluid to keep your urine clear or pale yellow.  Resume your normal diet as instructed by your health care provider. Avoid heavy or fried foods that are hard to digest.  Avoid drinking alcohol for as long as instructed by your health care provider. Contact a health care provider if:  You have blood in your stool 2-3 days after the procedure. Get help right away if:  You have more than a small spotting of blood in your stool.  You pass large blood clots in your stool.  Your abdomen is swollen.  You have nausea or vomiting.  You have a fever.  You have increasing abdominal pain that is not relieved with medicine. This information is not intended to replace advice given to you by your health care provider. Make sure you discuss any questions you have with your health care provider. Document Released: 04/14/2004 Document Revised: 05/25/2016 Document Reviewed: 11/12/2015 Elsevier Interactive Patient Education  2018 [place] Anesthesia is a term that refers to techniques, procedures, and medicines that help a person stay safe and comfortable during a medical procedure. Monitored anesthesia care, or sedation, is one type of anesthesia. Your anesthesia specialist may recommend sedation if you will be having a procedure that does not require you to be unconscious, such as:  Cataract surgery.  A dental procedure.  A biopsy.  A colonoscopy.  During the procedure, you may receive a medicine to help you relax (sedative). There are three levels of sedation:  Mild sedation. At this level, you may feel awake and relaxed. You will be able to follow directions.  Moderate sedation. At this level, you will be sleepy. You may not remember the procedure.  Deep sedation. At this level, you will be asleep. You will not remember  the procedure.  The more medicine you are given, the deeper your level of sedation will be. Depending on how you respond to the procedure, the anesthesia specialist may change your level of sedation or the type of anesthesia to fit your needs. An anesthesia specialist will monitor you closely during the procedure. Let your health care provider know about:  Any allergies you have.  All medicines you are taking, including vitamins, herbs, eye drops, creams, and over-the-counter medicines.  Any use of steroids (by mouth or as a cream).  Any problems you or family members have had with sedatives and anesthetic medicines.  Any blood disorders you have.  Any surgeries you have had.  Any medical conditions you have, such as sleep apnea.  Whether you are pregnant or may be pregnant.  Any use of cigarettes, alcohol, or street drugs. What are the risks? Generally, this is a safe procedure. However, problems may occur, including:  Getting too much medicine (oversedation).  Nausea.  Allergic reaction to medicines.  Trouble breathing. If this happens, a breathing tube may be used to help with breathing. It will be removed when you are awake and breathing on your own.  Heart trouble.  Lung trouble.  Before the procedure Staying hydrated Follow instructions from your health care provider about hydration, which may include:  Up to 2 hours before the procedure - you may continue to drink clear liquids, such as water, clear fruit juice, black coffee, and plain tea.  Eating and drinking restrictions Follow instructions from your health care provider about eating and drinking, which may include:  8 hours before the procedure - stop eating heavy meals or foods such as meat, fried foods, or fatty foods.  6 hours before the procedure - stop eating light meals or foods, such as toast or cereal.  6 hours before the procedure - stop drinking milk or drinks that contain milk.  2 hours before  the procedure - stop drinking clear liquids.  Medicines Ask your health care provider about:  Changing or stopping your regular medicines. This is especially important if you are taking diabetes medicines or blood thinners.  Taking medicines such as aspirin and ibuprofen. These medicines can thin your blood. Do not take these medicines before your procedure if your health care provider instructs you not to.  Tests and exams  You will have a physical exam.  You may have blood tests done to show: ? How well your kidneys and liver are working. ? How well your blood can clot.  General instructions  Plan to have someone take you home from the hospital or clinic.  If you will be going home right after the procedure, plan to have someone with you for 24 hours.  What happens during the procedure?  Your blood pressure, heart rate, breathing, level of pain and overall condition will be monitored.  An IV tube will be inserted into one of your veins.  Your anesthesia specialist will give you medicines as needed to keep you comfortable during the procedure. This may mean changing the level of sedation.  The procedure will be performed. After the procedure  Your blood pressure, heart rate, breathing rate, and blood oxygen level will be monitored until the medicines you were given have worn off.  Do not drive for 24 hours if you received a sedative.  You may: ? Feel sleepy, clumsy, or nauseous. ? Feel forgetful about what happened after the procedure. ? Have a sore throat if you had a breathing tube during the procedure. ? Vomit. This information is not intended to replace advice given to you by your health care provider. Make sure you discuss any questions you have with your health care provider. Document Released: 05/27/2005 Document Revised: 02/07/2016 Document Reviewed: 12/22/2015 Elsevier Interactive Patient Education  2018 [place], Care  After These instructions provide you with information about caring for yourself after your procedure. Your health care provider may also give you more  specific instructions. Your treatment has been planned according to current medical practices, but problems sometimes occur. Call your health care provider if you have any problems or questions after your procedure. What can I expect after the procedure? After your procedure, it is common to:  Feel sleepy for several hours.  Feel clumsy and have poor balance for several hours.  Feel forgetful about what happened after the procedure.  Have poor judgment for several hours.  Feel nauseous or vomit.  Have a sore throat if you had a breathing tube during the procedure.  Follow these instructions at home: For at least 24 hours after the procedure:   Do not: ? Participate in activities in which you could fall or become injured. ? Drive. ? Use heavy machinery. ? Drink alcohol. ? Take sleeping pills or medicines that cause drowsiness. ? Make important decisions or sign legal documents. ? Take care of children on your own.  Rest. Eating and drinking  Follow the diet that is recommended by your health care provider.  If you vomit, drink water, juice, or soup when you can drink without vomiting.  Make sure you have little or no nausea before eating solid foods. General instructions  Have a responsible adult stay with you until you are awake and alert.  Take over-the-counter and prescription medicines only as told by your health care provider.  If you smoke, do not smoke without supervision.  Keep all follow-up visits as told by your health care provider. This is important. Contact a health care provider if:  You keep feeling nauseous or you keep vomiting.  You feel light-headed.  You develop a rash.  You have a fever. Get help right away if:  You have trouble breathing. This information is not intended to replace advice  given to you by your health care provider. Make sure you discuss any questions you have with your health care provider. Document Released: 12/22/2015 Document Revised: 04/22/2016 Document Reviewed: 12/22/2015 Elsevier Interactive Patient Education  [employer].

## 2017-05-15 LAB — HEPATIC FUNCTION PANEL
ALBUMIN: 5 g/dL — AB (ref 3.6–4.8)
ALT: 42 IU/L (ref 0–44)
AST: 36 IU/L (ref 0–40)
Alkaline Phosphatase: 87 IU/L (ref 39–117)
BILIRUBIN TOTAL: 0.3 mg/dL (ref 0.0–1.2)
Bilirubin, Direct: 0.1 mg/dL (ref 0.00–0.40)
TOTAL PROTEIN: 7.5 g/dL (ref 6.0–8.5)

## 2017-05-15 LAB — HEPATITIS B SURFACE ANTIGEN: HEP B S AG: NEGATIVE

## 2017-05-15 LAB — HEPATITIS C ANTIBODY

## 2017-05-19 ENCOUNTER — Encounter (HOSPITAL_COMMUNITY)
Admission: RE | Admit: 2017-05-19 | Discharge: 2017-05-19 | Disposition: A | Discharge status: Home / Self Care | Payer: BC Managed Care – PPO | Source: Ambulatory Visit | Attending: Internal Medicine | Admitting: Internal Medicine

## 2017-05-19 ENCOUNTER — Encounter (HOSPITAL_COMMUNITY): Payer: Self-pay

## 2017-05-19 ENCOUNTER — Other Ambulatory Visit: Payer: Self-pay

## 2017-05-19 DIAGNOSIS — Z01810 Encounter for preprocedural cardiovascular examination: Secondary | ICD-10-CM | POA: Diagnosis not present

## 2017-05-19 DIAGNOSIS — Z01812 Encounter for preprocedural laboratory examination: Secondary | ICD-10-CM | POA: Diagnosis not present

## 2017-05-23 NOTE — Progress Notes (Signed)
Please let patient know his Hep B and C markers are negative. LFTs are ok.  Advise cutting back on etoh as discussed at OV, h/o hepatomegaly in 2016.  Patient should begin undergoing screening for urinary tract cancers and skin cancers yearly given Lynch syndrome, please remind him.

## 2017-05-24 ENCOUNTER — Ambulatory Visit (HOSPITAL_COMMUNITY): Payer: BC Managed Care – PPO | Admitting: Anesthesiology

## 2017-05-24 ENCOUNTER — Encounter (HOSPITAL_COMMUNITY): Payer: Self-pay | Admitting: *Deleted

## 2017-05-24 ENCOUNTER — Ambulatory Visit (HOSPITAL_COMMUNITY)
Admission: RE | Admit: 2017-05-24 | Discharge: 2017-05-24 | Disposition: A | Discharge status: Home / Self Care | Payer: BC Managed Care – PPO | Source: Ambulatory Visit | Attending: Internal Medicine | Admitting: Internal Medicine

## 2017-05-24 ENCOUNTER — Encounter (HOSPITAL_COMMUNITY)
Admission: RE | Disposition: A | Discharge status: Home / Self Care | Payer: Self-pay | Source: Ambulatory Visit | Attending: Internal Medicine

## 2017-05-24 DIAGNOSIS — K5730 Diverticulosis of large intestine without perforation or abscess without bleeding: Secondary | ICD-10-CM | POA: Insufficient documentation

## 2017-05-24 DIAGNOSIS — M1990 Unspecified osteoarthritis, unspecified site: Secondary | ICD-10-CM | POA: Insufficient documentation

## 2017-05-24 DIAGNOSIS — Z800 Family history of malignant neoplasm of digestive organs: Secondary | ICD-10-CM | POA: Insufficient documentation

## 2017-05-24 DIAGNOSIS — Z1211 Encounter for screening for malignant neoplasm of colon: Secondary | ICD-10-CM | POA: Insufficient documentation

## 2017-05-24 DIAGNOSIS — D122 Benign neoplasm of ascending colon: Secondary | ICD-10-CM | POA: Insufficient documentation

## 2017-05-24 DIAGNOSIS — F419 Anxiety disorder, unspecified: Secondary | ICD-10-CM

## 2017-05-24 DIAGNOSIS — Z7982 Long term (current) use of aspirin: Secondary | ICD-10-CM

## 2017-05-24 DIAGNOSIS — Z87891 Personal history of nicotine dependence: Secondary | ICD-10-CM

## 2017-05-24 DIAGNOSIS — Z96642 Presence of left artificial hip joint: Secondary | ICD-10-CM | POA: Insufficient documentation

## 2017-05-24 DIAGNOSIS — Z96652 Presence of left artificial knee joint: Secondary | ICD-10-CM

## 2017-05-24 DIAGNOSIS — Z86010 Personal history of colonic polyps: Secondary | ICD-10-CM | POA: Insufficient documentation

## 2017-05-24 DIAGNOSIS — E7800 Pure hypercholesterolemia, unspecified: Secondary | ICD-10-CM

## 2017-05-24 DIAGNOSIS — Z1509 Genetic susceptibility to other malignant neoplasm: Secondary | ICD-10-CM | POA: Insufficient documentation

## 2017-05-24 DIAGNOSIS — Z79899 Other long term (current) drug therapy: Secondary | ICD-10-CM

## 2017-05-24 DIAGNOSIS — K219 Gastro-esophageal reflux disease without esophagitis: Secondary | ICD-10-CM | POA: Insufficient documentation

## 2017-05-24 HISTORY — PX: POLYPECTOMY: SHX5525

## 2017-05-24 HISTORY — PX: COLONOSCOPY WITH PROPOFOL: SHX5780

## 2017-05-24 SURGERY — COLONOSCOPY WITH PROPOFOL
Anesthesia: Moderate Sedation

## 2017-05-24 SURGERY — COLONOSCOPY WITH PROPOFOL
Anesthesia: Monitor Anesthesia Care

## 2017-05-24 MED ORDER — FENTANYL CITRATE (PF) 100 MCG/2ML IJ SOLN
25.0000 ug | Freq: Once | INTRAMUSCULAR | Status: AC
  Administered 2017-05-24: 25 ug via INTRAVENOUS

## 2017-05-24 MED ORDER — PROPOFOL 10 MG/ML IV BOLUS
INTRAVENOUS | Status: DC | PRN
  Administered 2017-05-24 (×4): 20 mg via INTRAVENOUS

## 2017-05-24 MED ORDER — LACTATED RINGERS IV SOLN
INTRAVENOUS | Status: DC
  Administered 2017-05-24: 08:00:00 via INTRAVENOUS

## 2017-05-24 MED ORDER — MIDAZOLAM HCL 2 MG/2ML IJ SOLN
1.0000 mg | INTRAMUSCULAR | Status: AC
  Administered 2017-05-24: 2 mg via INTRAVENOUS

## 2017-05-24 MED ORDER — LIDOCAINE HCL (CARDIAC) 10 MG/ML IV SOLN
INTRAVENOUS | Status: DC | PRN
  Administered 2017-05-24: 50 mg via INTRAVENOUS

## 2017-05-24 MED ORDER — FENTANYL CITRATE (PF) 100 MCG/2ML IJ SOLN
INTRAMUSCULAR | Status: AC
  Filled 2017-05-24: qty 2

## 2017-05-24 MED ORDER — PROPOFOL 500 MG/50ML IV EMUL
INTRAVENOUS | Status: DC | PRN
  Administered 2017-05-24 (×2): 150 ug/kg/min via INTRAVENOUS

## 2017-05-24 MED ORDER — PROPOFOL 10 MG/ML IV BOLUS
INTRAVENOUS | Status: AC
  Filled 2017-05-24: qty 60

## 2017-05-24 MED ORDER — MIDAZOLAM HCL 2 MG/2ML IJ SOLN
INTRAMUSCULAR | Status: AC
  Filled 2017-05-24: qty 2

## 2017-05-24 NOTE — H&P (View-Only) (Signed)
Primary Care Physician:  Redmond School, MD  Primary Gastroenterologist:  Garfield Cornea, MD   Chief Complaint  Patient presents with  . Colonoscopy    HPI:  Craig Scott is a 61 y.o. male with history of Lynch syndrome presenting to schedule surveillance colonoscopy. Last colonoscopy December 2016, sigmoid tubular adenoma removed, benign cecal polyp, rare diverticula. Advised come back in 18 months for surveillance colonoscopy. His last EGD 2007. He had a peptic stricture with reflux esophagitis status post dilation, bulbar erosions, small hiatal hernia.  Clinically he does well. Request Zegerid samples to use as needed. Takes PPI on occasion. Denies dysphagia, vomiting, abdominal pain. He has had some change in bowel habits, used to have a bowel movement twice daily but now every other day or so. No melena rectal bleeding. No unintentional weight loss. Continues to consume alcohol daily, several beers every evening.   Current Outpatient Prescriptions  Medication Sig Dispense Refill  . ALPRAZolam (XANAX) 1 MG tablet Take 1 mg by mouth every 6 (six) hours as needed for anxiety. anxiety    . aspirin EC 81 MG tablet Take 81 mg by mouth daily.    . Omega-3 Fatty Acids (FISH OIL PO) Take 1 capsule by mouth daily.    . Potassium 95 MG TABS Take 95 mg by mouth daily.    . simvastatin (ZOCOR) 20 MG tablet Take 20 mg by mouth every morning.      No current facility-administered medications for this visit.     Allergies as of 04/29/2017 - Review Complete 04/29/2017  Allergen Reaction Noted  . Lyrica [pregabalin] Other (See Comments) 03/14/2015  . Celebrex [celecoxib] Rash 01/12/2013    Past Medical History:  Diagnosis Date  . Anxiety   . Arthritis    ra  . GERD (gastroesophageal reflux disease)   . High cholesterol   . Lynch syndrome    has gene that causes cancer  . Neuropathy    both feet  . Tubular adenoma     Past Surgical History:  Procedure Laterality Date  .  BUNIONECTOMY WITH HAMMERTOE RECONSTRUCTION AND GASTROC SLIDE Right 06/27/2015   Procedure: RIGHT GASTROC RECESSION/POSTERIOR TIBIAL TENOLYSIS/SECOND AND THIRD METATARSAL WEIL OSTEOTOMY AND HAMMERTOE CORRECTION;  Surgeon: Wylene Simmer, MD;  Location: [facility];  Service: Orthopedics;  Laterality: Right;  . CALCANEAL OSTEOTOMY Right 06/27/2015   Procedure: CALCANEAL OSTEOTOMY;  Surgeon: Wylene Simmer, MD;  Location: [facility];  Service: Orthopedics;  Laterality: Right;  . COLONOSCOPY   12/09/01   RMR: Normal normal rectum/ A few scattered left-sided diverticula.  The remainder of the colonic  mucosa appeared normal  . COLONOSCOPY  12/15/2002   RMR: Normal rectum, scattered left-sided diverticula.  The remainder of the colonic mucosa appeared normal  . COLONOSCOPY  04/10/2004   RMR: Normal rectum/Sigmoid diverticula/The remainder of the colonic mucosa appeared normal  . COLONOSCOPY  12/23/2005   RMR: Normal rectum, scattered sigmoid diverticula Colonic mucosa appeared normal  . COLONOSCOPY    01/20/2008   RMR: Distal diminutive rectal polyps, status post cold biopsy removal  otherwise, normal rectum/ biopsy removal; scattered sigmoid diverticula; and benign-appearing/ulcers about the ileocecal valve, status post biopsy.  Remainder of colonic mucosa and terminal mucosa appeared normal.  . COLONOSCOPY  05/22/2010   RMR: distal diminutive rectal polyp s/p bx otherwise normal/few scattered pancolonic diverticula  . COLONOSCOPY  09/16/2012   Dr.Rourk- normal rectum, few scattered pancolonic diverticulosis, one diminutive polyp in the base of the cecum. 3x3 area  of hyper pigmentation i the mid descending segment, this was a flat benign-appearing area o/w the remainder of the colonic mucosa appeared normal. bx= tubular adenoma and benign colonic mucosa  . COLONOSCOPY WITH PROPOFOL N/A 09/05/2015   Procedure: COLONOSCOPY WITH PROPOFOL;  Surgeon: Daneil Dolin, MD;  [facility]: [facility];  Service: Endoscopy;  Laterality: N/A;  0830  . ESOPHAGOGASTRODUODENOSCOPY  12/23/2005   RMR:   Esophagogastric peptic stricture with reflux esophagitis, status post  dilation as described above/ Small hiatal hernia, otherwise normal stomach.  Bulbar erosions otherwise normal D1 and D2  . EYE SURGERY  as child  . HARDWARE REMOVAL Right 06/25/2016   Procedure: REMOVAL OF DEEP IMPLANTS RIGHT MEDIAL CUNEIFORM, RIGHT CALCANEAL REMOVAL OF DEEP IMPLANTS;  Surgeon: Wylene Simmer, MD;  Location: [facility];  Service: Orthopedics;  Laterality: Right;  . JOINT REPLACEMENT  09/30/2010   Right knee  . KNEE SURGERY Right    bilat   . NOSE SURGERY     cartiledge removal  . POLYPECTOMY  09/05/2015   Procedure: POLYPECTOMY;  Surgeon: Daneil Dolin, MD;  Location: [facility];  Service: Endoscopy;;  . TOTAL HIP ARTHROPLASTY Left 03/20/2015   Procedure: LEFT TOTAL HIP ARTHROPLASTY ANTERIOR APPROACH;  Surgeon: Gaynelle Arabian, MD;  Location: [facility];  Service: Orthopedics;  Laterality: Left;  . TOTAL KNEE ARTHROPLASTY Left 10/23/2013   Procedure: LEFT TOTAL KNEE ARTHROPLASTY;  Surgeon: [name], MD;  Location: [facility];  Service: Orthopedics;  Laterality: Left;  . TYMPANOSTOMY TUBE PLACEMENT Right    early 2014    Family History  Problem Relation Age of Onset  . Colon cancer Brother        87   . Colon cancer Brother        23  . Colon cancer Mother        age 60   . Colon cancer Cousin        56 years old onset  . Colon cancer Cousin        73 years onset    Social History   Social History  . Marital status: Married    Spouse name: N/A  . Number of children: 2  . Years of education: 12   Occupational History  . [place] [employer]    [place]   Social History Main Topics  . Smoking status: Former Smoker    Packs/day: 0.50    Years: 30.00    Types: Cigars, Cigarettes    Quit date: 09/14/2005  . Smokeless tobacco: Never Used     Comment: quit 4-5 years ago   . Alcohol use Yes     Comment: daily, multiple beers in the evening   . Drug use: No     Comment: past use of marijuana- last use 10 years ago.  Marland Kitchen Sexual activity: No   Other Topics Concern  . Not on file   Social History Narrative  . No narrative on file      ROS:  General: Negative for anorexia, weight loss, fever, chills, fatigue, weakness. Eyes: Negative for vision changes.  ENT: Negative for hoarseness, difficulty swallowing , nasal congestion. CV: Negative for chest pain, angina, palpitations, dyspnea on exertion, peripheral edema.  Respiratory: Negative for dyspnea at rest, dyspnea on exertion, cough, sputum, wheezing.  GI: See history of present illness. GU:  Negative for dysuria, hematuria, urinary incontinence, urinary frequency, nocturnal urination.  MS: Negative for low back pain. Positive right ankle  pain, multiple surgeries Derm: Negative for rash or itching.  Neuro: Negative for weakness, abnormal sensation, seizure, frequent headaches, memory loss, confusion.  Psych: Negative for anxiety, depression, suicidal ideation, hallucinations.  Endo: Negative for unusual weight change.  Heme: Negative for bruising or bleeding. Allergy: Negative for rash or hives.    Physical Examination:  BP 137/82   Pulse 81   Temp 97.6 F (36.4 C) (Oral)   Ht 6' (1.829 m)   Wt 234 lb 3.2 oz (106.2 kg)   BMI 31.76 kg/m    General: Well-nourished, well-developed in no acute distress.  Head: Normocephalic, atraumatic.   Eyes: Conjunctiva pink, no icterus. Mouth: Oropharyngeal mucosa moist and pink , no lesions erythema or exudate. Neck: Supple without thyromegaly, masses, or lymphadenopathy.  Lungs: Clear to auscultation bilaterally.  Heart: Regular rate and rhythm, no murmurs rubs or gallops.  Abdomen: Bowel sounds are normal, nontender, nondistended, no hepatosplenomegaly or masses, no abdominal bruits or    hernia , no rebound or guarding.   Rectal: Deferred Extremities:  No lower extremity edema. No clubbing or deformities.  Neuro: Alert and oriented x 4 , grossly normal neurologically.  Skin: Warm and dry, no rash or jaundice.   Psych: Alert and cooperative, normal mood and affect.  Imaging Studies: No results found.

## 2017-05-24 NOTE — Discharge Instructions (Signed)
Colonoscopy Discharge Instructions  Read the instructions outlined below and refer to this sheet in the next few weeks. These discharge instructions provide you with general information on caring for yourself after you leave the hospital. Your doctor may also give you specific instructions. While your treatment has been planned according to the most current medical practices available, unavoidable complications occasionally occur. If you have any problems or questions after discharge, call Dr. Gala Romney at [phone]. ACTIVITY  You may resume your regular activity, but move at a slower pace for the next 24 hours.   Take frequent rest periods for the next 24 hours.   Walking will help get rid of the air and reduce the bloated feeling in your belly (abdomen).   No driving for 24 hours (because of the medicine (anesthesia) used during the test).    Do not sign any important legal documents or operate any machinery for 24 hours (because of the anesthesia used during the test).  NUTRITION  Drink plenty of fluids.   You may resume your normal diet as instructed by your doctor.   Begin with a light meal and progress to your normal diet. Heavy or fried foods are harder to digest and may make you feel sick to your stomach (nauseated).   Avoid alcoholic beverages for 24 hours or as instructed.  MEDICATIONS  You may resume your normal medications unless your doctor tells you otherwise.  WHAT YOU CAN EXPECT TODAY  Some feelings of bloating in the abdomen.   Passage of more gas than usual.   Spotting of blood in your stool or on the toilet paper.  IF YOU HAD POLYPS REMOVED DURING THE COLONOSCOPY:  No aspirin products for 7 days or as instructed.   No alcohol for 7 days or as instructed.   Eat a soft diet for the next 24 hours.  FINDING OUT THE RESULTS OF YOUR TEST Not all test results are available during your visit. If your test results are not back during the visit, make an appointment  with your caregiver to find out the results. Do not assume everything is normal if you have not heard from your caregiver or the medical facility. It is important for you to follow up on all of your test results.  SEEK IMMEDIATE MEDICAL ATTENTION IF:  You have more than a spotting of blood in your stool.   Your belly is swollen (abdominal distention).   You are nauseated or vomiting.   You have a temperature over 101.   You have abdominal pain or discomfort that is severe or gets worse throughout the day.    Colon polyp and diverticulosis information provided  Further recommendations to follow pending review of pathology report     Colon Polyps Polyps are tissue growths inside the body. Polyps can grow in many places, including the large intestine (colon). A polyp may be a round bump or a mushroom-shaped growth. You could have one polyp or several. Most colon polyps are noncancerous (benign). However, some colon polyps can become cancerous over time. What are the causes? The exact cause of colon polyps is not known. What increases the risk? This condition is more likely to develop in people who:  Have a family history of colon cancer or colon polyps.  Are older than 14 or older than 45 if they are African American.  Have inflammatory bowel disease, such as ulcerative colitis or Crohn disease.  Are overweight.  Smoke cigarettes.  Do not get enough exercise.  Drink too much alcohol.  Eat a diet that is: ? High in fat and red meat. ? Low in fiber.  Had childhood cancer that was treated with abdominal radiation.  What are the signs or symptoms? Most polyps do not cause symptoms. If you have symptoms, they may include:  Blood coming from your rectum when having a bowel movement.  Blood in your stool.The stool may look dark red or black.  A change in bowel habits, such as constipation or diarrhea.  How is this diagnosed? This condition is diagnosed with a  colonoscopy. This is a procedure that uses a lighted, flexible scope to look at the inside of your colon. How is this treated? Treatment for this condition involves removing any polyps that are found. Those polyps will then be tested for cancer. If cancer is found, your health care provider will talk to you about options for colon cancer treatment. Follow these instructions at home: Diet  Eat plenty of fiber, such as fruits, vegetables, and whole grains.  Eat foods that are high in calcium and vitamin D, such as milk, cheese, yogurt, eggs, liver, fish, and broccoli.  Limit foods high in fat, red meats, and processed meats, such as hot dogs, sausage, bacon, and lunch meats.  Maintain a healthy weight, or lose weight if recommended by your health care provider. General instructions  Do not smoke cigarettes.  Do not drink alcohol excessively.  Keep all follow-up visits as told by your health care provider. This is important. This includes keeping regularly scheduled colonoscopies. Talk to your health care provider about when you need a colonoscopy.  Exercise every day or as told by your health care provider. Contact a health care provider if:  You have new or worsening bleeding during a bowel movement.  You have new or increased blood in your stool.  You have a change in bowel habits.  You unexpectedly lose weight. This information is not intended to replace advice given to you by your health care provider. Make sure you discuss any questions you have with your health care provider. Document Released: 05/27/2004 Document Revised: 02/06/2016 Document Reviewed: 07/22/2015 Elsevier Interactive Patient Education  [employer].    Diverticulosis Diverticulosis is a condition that develops when small pouches (diverticula) form in the wall of the large intestine (colon). The colon is where water is absorbed and stool is formed. The pouches form when the inside layer of the colon  pushes through weak spots in the outer layers of the colon. You may have a few pouches or many of them. What are the causes? The cause of this condition is not known. What increases the risk? The following factors may make you more likely to develop this condition: Being older than age 43. Your risk for this condition increases with age. Diverticulosis is rare among people younger than age 9. By age 87, many people have it. Eating a low-fiber diet. Having frequent constipation. Being overweight. Not getting enough exercise. Smoking. Taking over-the-counter pain medicines, like aspirin and ibuprofen. Having a family history of diverticulosis.  What are the signs or symptoms? In most people, there are no symptoms of this condition. If you do have symptoms, they may include: Bloating. Cramps in the abdomen. Constipation or diarrhea. Pain in the lower left side of the abdomen.  How is this diagnosed? This condition is most often diagnosed during an exam for other colon problems. Because diverticulosis usually has no symptoms, it often cannot be diagnosed independently. This condition  may be diagnosed by: Using a flexible scope to examine the colon (colonoscopy). Taking an X-ray of the colon after dye has been put into the colon (barium enema). Doing a CT scan.  How is this treated? You may not need treatment for this condition if you have never developed an infection related to diverticulosis. If you have had an infection before, treatment may include: Eating a high-fiber diet. This may include eating more fruits, vegetables, and grains. Taking a fiber supplement. Taking a live bacteria supplement (probiotic). Taking medicine to relax your colon. Taking antibiotic medicines.  Follow these instructions at home: Drink 6-8 glasses of water or more each day to prevent constipation. Try not to strain when you have a bowel movement. If you have had an infection before: Eat more fiber as  directed by your health care provider or your diet and nutrition specialist (dietitian). Take a fiber supplement or probiotic, if your health care provider approves. Take over-the-counter and prescription medicines only as told by your health care provider. If you were prescribed an antibiotic, take it as told by your health care provider. Do not stop taking the antibiotic even if you start to feel better. Keep all follow-up visits as told by your health care provider. This is important. Contact a health care provider if: You have pain in your abdomen. You have bloating. You have cramps. You have not had a bowel movement in 3 days. Get help right away if: Your pain gets worse. Your bloating becomes very bad. You have a fever or chills, and your symptoms suddenly get worse. You vomit. You have bowel movements that are bloody or black. You have bleeding from your rectum. Summary Diverticulosis is a condition that develops when small pouches (diverticula) form in the wall of the large intestine (colon). You may have a few pouches or many of them. This condition is most often diagnosed during an exam for other colon problems. If you have had an infection related to diverticulosis, treatment may include increasing the fiber in your diet, taking supplements, or taking medicines. This information is not intended to replace advice given to you by your health care provider. Make sure you discuss any questions you have with your health care provider. Document Released: 05/28/2004 Document Revised: 07/20/2016 Document Reviewed: 07/20/2016 Elsevier Interactive Patient Education  2017 Elsevier Inc[employer]   PATIENT INSTRUCTIONS POST-ANESTHESIA  IMMEDIATELY FOLLOWING SURGERY:  Do not drive or operate machinery for the first twenty four hours after surgery.  Do not make any important decisions for twenty four hours after surgery or while taking narcotic pain medications or sedatives.  If you develop  intractable nausea and vomiting or a severe headache please notify your doctor immediately.  FOLLOW-UP:  Please make an appointment with your surgeon as instructed. You do not need to follow up with anesthesia unless specifically instructed to do so.  WOUND CARE INSTRUCTIONS (if applicable):  Keep a dry clean dressing on the anesthesia/puncture wound site if there is drainage.  Once the wound has quit draining you may leave it open to air.  Generally you should leave the bandage intact for twenty four hours unless there is drainage.  If the epidural site drains for more than 36-48 hours please call the anesthesia department.  QUESTIONS?:  Please feel free to call your physician or the hospital operator if you have any questions, and they will be happy to assist you.

## 2017-05-24 NOTE — Anesthesia Preprocedure Evaluation (Signed)
Anesthesia Evaluation  Patient identified by MRN, date of birth, ID band Patient awake    Reviewed: Allergy & Precautions, NPO status   Airway Mallampati: II       Dental  (+) Teeth Intact,    Pulmonary former smoker,    Pulmonary exam normal        Cardiovascular Normal cardiovascular exam     Neuro/Psych Anxiety    GI/Hepatic GERD  Medicated and Controlled,(+)     substance abuse  alcohol use,   Endo/Other    Renal/GU      Musculoskeletal  (+) Arthritis , Osteoarthritis,    Abdominal Normal abdominal exam  (+)   Peds  Hematology   Anesthesia Other Findings   Reproductive/Obstetrics                             Anesthesia Physical Anesthesia Plan  ASA: III  Anesthesia Plan: MAC   Post-op Pain Management:    Induction: Intravenous  PONV Risk Score and Plan:   Airway Management Planned: Simple Face Mask  Additional Equipment:   Intra-op Plan:   Post-operative Plan:   Informed Consent: I have reviewed the patients History and Physical, chart, labs and discussed the procedure including the risks, benefits and alternatives for the proposed anesthesia with the patient or authorized representative who has indicated his/her understanding and acceptance.     Plan Discussed with:   Anesthesia Plan Comments:         Anesthesia Quick Evaluation

## 2017-05-24 NOTE — Interval H&P Note (Signed)
History and Physical Interval Note:  05/24/2017 10:29 AM  Craig Scott  has presented today for surgery, with the diagnosis of history of polyps  The various methods of treatment have been discussed with the patient and family. After consideration of risks, benefits and other options for treatment, the patient has consented to  Procedure(s) with comments: COLONOSCOPY WITH PROPOFOL (N/A) - 730  as a surgical intervention .  The patient's history has been reviewed, patient examined, no change in status, stable for surgery.  I have reviewed the patient's chart and labs.  Questions were answered to the patient's satisfaction.     Craig Scott  No change. Colonoscopy per plan  The risks, benefits, limitations, alternatives and imponderables have been reviewed with the patient. Questions have been answered. All parties are agreeable.

## 2017-05-24 NOTE — Transfer of Care (Signed)
Immediate Anesthesia Transfer of Care Note  Patient: Craig Scott  Procedure(s) Performed: Procedure(s) with comments: COLONOSCOPY WITH PROPOFOL (N/A) POLYPECTOMY - colon  Patient Location: PACU  Anesthesia Type:MAC  Level of Consciousness: awake and patient cooperative  Airway & Oxygen Therapy: Patient Spontanous Breathing  Post-op Assessment: Report given to RN and Post -op Vital signs reviewed and stable  Post vital signs: Reviewed and stable  Last Vitals: There were no vitals filed for this visit.  Last Pain:  Vitals:   05/24/17 0738  PainSc: 7       Patients Stated Pain Goal: 7 (41/66/06 3016)  Complications: No apparent anesthesia complications

## 2017-05-24 NOTE — Anesthesia Postprocedure Evaluation (Signed)
Anesthesia Post Note  Patient: Craig Scott  Procedure(s) Performed: Procedure(s) (LRB): COLONOSCOPY WITH PROPOFOL (N/A) POLYPECTOMY  Patient location during evaluation: PACU Anesthesia Type: MAC Level of consciousness: awake and alert Pain management: satisfactory to patient Vital Signs Assessment: post-procedure vital signs reviewed and stable Respiratory status: spontaneous breathing Cardiovascular status: stable Postop Assessment: no signs of nausea or vomiting Anesthetic complications: no     Last Vitals:  Vitals:   05/24/17 0816  BP: 135/89  Pulse: 73  Resp: 15  Temp: 36.6 C  SpO2: 97%    Last Pain:  Vitals:   05/24/17 0738  PainSc: 7                  Renaye Janicki

## 2017-05-24 NOTE — Anesthesia Procedure Notes (Signed)
Procedure Name: MAC Date/Time: 05/24/2017 7:36 AM Performed by: Vista Deck Pre-anesthesia Checklist: Patient identified, Emergency Drugs available, Suction available, Timeout performed and Patient being monitored Patient Re-evaluated:Patient Re-evaluated prior to induction Oxygen Delivery Method: Non-rebreather mask

## 2017-05-24 NOTE — H&P (View-Only) (Signed)
Please let patient know his Hep B and C markers are negative. LFTs are ok.  Advise cutting back on etoh as discussed at OV, h/o hepatomegaly in 2016.  Patient should begin undergoing screening for urinary tract cancers and skin cancers yearly given Lynch syndrome, please remind him.

## 2017-05-24 NOTE — Interval H&P Note (Signed)
History and Physical Interval Note:  05/24/2017 7:31 AM  Craig Scott  has presented today for surgery, with the diagnosis of HIstory of polyps  The various methods of treatment have been discussed with the patient and family. After consideration of risks, benefits and other options for treatment, the patient has consented to  Procedure(s): COLONOSCOPY WITH PROPOFOL (N/A) as a surgical intervention .  The patient's history has been reviewed, patient examined, no change in status, stable for surgery.  I have reviewed the patient's chart and labs.  Questions were answered to the patient's satisfaction.    No change; tcs per plan.  The risks, benefits, limitations, alternatives and imponderables have been reviewed with the patient. Questions have been answered. All parties are agreeable.  [name]

## 2017-05-24 NOTE — Op Note (Signed)
[facility] Patient Name: Craig Scott Procedure Date: 05/24/2017 7:09 AM MRN: [id number] Date of Birth: 1956/03/16 Attending MD: Norvel Richards , MD CSN: [id number] Age: 61 Admit Type: Outpatient Procedure:                Colonoscopy Indications:              High risk colon cancer surveillance: Personal                            history of colonic polyps /Lynch syndrome Providers:                Norvel Richards, MD, Jeanann Lewandowsky. Sharon Seller, RN,                            Aram Candela Referring MD:              Medicines:                Propofol per Anesthesia Complications:            No immediate complications. Estimated Blood Loss:     Estimated blood loss: none. Procedure:                Pre-Anesthesia Assessment:                           - Prior to the procedure, a History and Physical                            was performed, and patient medications and                            allergies were reviewed. The patient's tolerance of                            previous anesthesia was also reviewed. The risks                            and benefits of the procedure and the sedation                            options and risks were discussed with the patient.                            All questions were answered, and informed consent                            was obtained. Prior Anticoagulants: The patient has                            taken no previous anticoagulant or antiplatelet                            agents. ASA Grade Assessment: II - A patient with  mild systemic disease. After reviewing the risks                            and benefits, the patient was deemed in                            satisfactory condition to undergo the procedure.                           After obtaining informed consent, the colonoscope                            was passed under direct vision. Throughout the                            procedure, the  patient's blood pressure, pulse, and                            oxygen saturations were monitored continuously. The                            EC-[id number] ([id number]) scope was introduced through                            the and advanced to the the cecum, identified by                            appendiceal orifice and ileocecal valve. The                            ileocecal valve, appendiceal orifice, and rectum                            were photographed. The entire colon was well                            visualized. The patient tolerated the procedure                            well. The quality of the bowel preparation was                            adequate. Scope In: 7:48:01 AM Scope Out: 8:08:51 AM Scope Withdrawal Time: 0 hours 14 minutes 49 seconds  Total Procedure Duration: 0 hours 20 minutes 50 seconds  Findings:      The perianal and digital rectal examinations were normal.      Scattered small and large-mouthed diverticula were found in the entire       colon.      A 7 mm polyp was found in the ascending colon. The polyp was sessile.       The polyp was removed with a hot snare. Resection and retrieval were       complete. Estimated blood loss: none.      The exam was otherwise without abnormality. Impression:               -  Diverticulosis in the entire examined colon.                           - One 7 mm polyp in the ascending colon, removed                            with a hot snare. Resected and retrieved.                           - The examination was otherwise normal. Moderate Sedation:      Moderate (conscious) sedation was personally administered by an       anesthesia professional. The following parameters were monitored: oxygen       saturation, heart rate, blood pressure, respiratory rate, EKG, adequacy       of pulmonary ventilation, and response to care. Total physician       intraservice time was 30 minutes. Recommendation:           - Return to normal  activities in 1 day.                           - Resume previous diet.                           - Continue present medications.                           - Repeat colonoscopy date to be determined after                            pending pathology results are reviewed for                            surveillance based on pathology results.                           - Return to GI clinic (date not yet determined). Procedure Code(s):        --- Professional ---                           [phone], Colonoscopy, flexible; with removal of                            tumor(s), polyp(s), or other lesion(s) by snare                            technique Diagnosis Code(s):        --- Professional ---                           Z86.010, Personal history of colonic polyps                           D12.2, Benign neoplasm of ascending colon  K57.30, Diverticulosis of large intestine without                            perforation or abscess without bleeding CPT copyright 2016 [employer]. All rights reserved. The codes documented in this report are preliminary and upon coder review may  be revised to meet current compliance requirements. [name]. Haelee Bolen, MD Norvel Richards, MD 05/24/2017 8:19:59 AM This report has been signed electronically. Number of Addenda: 0

## 2017-05-24 NOTE — H&P (Signed)
error 

## 2017-05-25 ENCOUNTER — Encounter: Payer: Self-pay | Admitting: Internal Medicine

## 2017-05-26 ENCOUNTER — Encounter (HOSPITAL_COMMUNITY): Payer: Self-pay | Admitting: Internal Medicine

## 2017-05-26 ENCOUNTER — Other Ambulatory Visit: Payer: Self-pay | Admitting: Gastroenterology

## 2017-05-27 ENCOUNTER — Other Ambulatory Visit: Payer: Self-pay | Admitting: Gastroenterology

## 2017-05-27 ENCOUNTER — Other Ambulatory Visit: Payer: Self-pay

## 2017-05-27 ENCOUNTER — Telehealth: Payer: Self-pay

## 2017-05-27 DIAGNOSIS — Z1509 Genetic susceptibility to other malignant neoplasm: Secondary | ICD-10-CM

## 2017-05-27 NOTE — Telephone Encounter (Signed)
-----   Message from Mahala Menghini, PA-C sent at 05/27/2017  9:40 AM EDT ----- Try lynch syndrome ----- Message ----- From: Claudina Lick, LPN Sent: 01/01/9143  11:38 AM To: Mahala Menghini, PA-C   What is the diagnosis for this?  ----- Message ----- From: Mahala Menghini, PA-C Sent: 04/30/2017   8:13 AM To: Mahala Menghini, PA-C, Claudina Lick, LPN  Can we add h.pylori for this guy? If there is a lab that will do serologies. Otherwise we will have to do stool ag and hold PPI.

## 2017-05-27 NOTE — Telephone Encounter (Signed)
Orders done and mailed to the pt with instructions.

## 2017-06-01 ENCOUNTER — Encounter (HOSPITAL_COMMUNITY): Payer: Self-pay | Admitting: Internal Medicine

## 2017-06-09 ENCOUNTER — Encounter (HOSPITAL_BASED_OUTPATIENT_CLINIC_OR_DEPARTMENT_OTHER): Payer: Self-pay | Admitting: *Deleted

## 2017-06-17 ENCOUNTER — Ambulatory Visit (HOSPITAL_BASED_OUTPATIENT_CLINIC_OR_DEPARTMENT_OTHER): Payer: BC Managed Care – PPO | Admitting: Certified Registered"

## 2017-06-17 ENCOUNTER — Encounter (HOSPITAL_BASED_OUTPATIENT_CLINIC_OR_DEPARTMENT_OTHER)
Admission: RE | Disposition: A | Discharge status: Home / Self Care | Payer: Self-pay | Source: Ambulatory Visit | Attending: Orthopedic Surgery

## 2017-06-17 ENCOUNTER — Encounter (HOSPITAL_BASED_OUTPATIENT_CLINIC_OR_DEPARTMENT_OTHER): Payer: Self-pay | Admitting: Certified Registered"

## 2017-06-17 ENCOUNTER — Ambulatory Visit (HOSPITAL_BASED_OUTPATIENT_CLINIC_OR_DEPARTMENT_OTHER)
Admission: RE | Admit: 2017-06-17 | Discharge: 2017-06-17 | Disposition: A | Discharge status: Home / Self Care | Payer: BC Managed Care – PPO | Source: Ambulatory Visit | Attending: Orthopedic Surgery | Admitting: Orthopedic Surgery

## 2017-06-17 DIAGNOSIS — G629 Polyneuropathy, unspecified: Secondary | ICD-10-CM | POA: Insufficient documentation

## 2017-06-17 DIAGNOSIS — K219 Gastro-esophageal reflux disease without esophagitis: Secondary | ICD-10-CM | POA: Insufficient documentation

## 2017-06-17 DIAGNOSIS — X58XXXA Exposure to other specified factors, initial encounter: Secondary | ICD-10-CM | POA: Diagnosis not present

## 2017-06-17 DIAGNOSIS — Z87891 Personal history of nicotine dependence: Secondary | ICD-10-CM | POA: Insufficient documentation

## 2017-06-17 DIAGNOSIS — Z79899 Other long term (current) drug therapy: Secondary | ICD-10-CM | POA: Diagnosis not present

## 2017-06-17 DIAGNOSIS — F419 Anxiety disorder, unspecified: Secondary | ICD-10-CM | POA: Insufficient documentation

## 2017-06-17 DIAGNOSIS — E7800 Pure hypercholesterolemia, unspecified: Secondary | ICD-10-CM | POA: Insufficient documentation

## 2017-06-17 DIAGNOSIS — M2141 Flat foot [pes planus] (acquired), right foot: Secondary | ICD-10-CM | POA: Diagnosis not present

## 2017-06-17 DIAGNOSIS — S86311A Strain of muscle(s) and tendon(s) of peroneal muscle group at lower leg level, right leg, initial encounter: Secondary | ICD-10-CM | POA: Diagnosis not present

## 2017-06-17 DIAGNOSIS — M76821 Posterior tibial tendinitis, right leg: Secondary | ICD-10-CM | POA: Insufficient documentation

## 2017-06-17 DIAGNOSIS — Z7982 Long term (current) use of aspirin: Secondary | ICD-10-CM | POA: Diagnosis not present

## 2017-06-17 DIAGNOSIS — M19071 Primary osteoarthritis, right ankle and foot: Secondary | ICD-10-CM | POA: Insufficient documentation

## 2017-06-17 HISTORY — PX: FOOT ARTHRODESIS: SHX1655

## 2017-06-17 HISTORY — DX: Alcohol dependence, uncomplicated: F10.20

## 2017-06-17 SURGERY — FUSION, JOINT, FOOT
Anesthesia: General | Site: Foot | Laterality: Right

## 2017-06-17 MED ORDER — SENNA 8.6 MG PO TABS: 2.0000 | ORAL_TABLET | Freq: Two times a day (BID) | ORAL | 0 refills | Status: DC

## 2017-06-17 MED ORDER — ACETAMINOPHEN 500 MG PO TABS
1000.0000 mg | ORAL_TABLET | Freq: Once | ORAL | Status: AC
  Administered 2017-06-17: 1000 mg via ORAL

## 2017-06-17 MED ORDER — ACETAMINOPHEN 500 MG PO TABS
ORAL_TABLET | ORAL | Status: AC
  Filled 2017-06-17: qty 2

## 2017-06-17 MED ORDER — FENTANYL CITRATE (PF) 100 MCG/2ML IJ SOLN
50.0000 ug | INTRAMUSCULAR | Status: DC | PRN
  Administered 2017-06-17: 100 ug via INTRAVENOUS

## 2017-06-17 MED ORDER — DEXAMETHASONE SODIUM PHOSPHATE 10 MG/ML IJ SOLN
INTRAMUSCULAR | Status: DC | PRN
  Administered 2017-06-17: 10 mg via INTRAVENOUS

## 2017-06-17 MED ORDER — CEFAZOLIN SODIUM-DEXTROSE 2-4 GM/100ML-% IV SOLN
INTRAVENOUS | Status: AC
Start: 2017-06-17 — End: 2017-06-17
  Filled 2017-06-17: qty 100

## 2017-06-17 MED ORDER — ASPIRIN EC 81 MG PO TBEC: 81.0000 mg | DELAYED_RELEASE_TABLET | Freq: Two times a day (BID) | ORAL | 0 refills | Status: AC

## 2017-06-17 MED ORDER — SODIUM CHLORIDE 0.9 % IV SOLN: INTRAVENOUS | Status: DC

## 2017-06-17 MED ORDER — CEFAZOLIN SODIUM-DEXTROSE 2-4 GM/100ML-% IV SOLN
2.0000 g | INTRAVENOUS | Status: AC
  Administered 2017-06-17: 2 g via INTRAVENOUS

## 2017-06-17 MED ORDER — PROPOFOL 10 MG/ML IV BOLUS
INTRAVENOUS | Status: DC | PRN
  Administered 2017-06-17: 200 mg via INTRAVENOUS

## 2017-06-17 MED ORDER — DOCUSATE SODIUM 100 MG PO CAPS: 100.0000 mg | ORAL_CAPSULE | Freq: Two times a day (BID) | ORAL | 0 refills | Status: DC

## 2017-06-17 MED ORDER — LACTATED RINGERS IV SOLN
INTRAVENOUS | Status: DC
  Administered 2017-06-17 (×2): via INTRAVENOUS

## 2017-06-17 MED ORDER — SCOPOLAMINE 1 MG/3DAYS TD PT72: 1.0000 | MEDICATED_PATCH | Freq: Once | TRANSDERMAL | Status: DC | PRN

## 2017-06-17 MED ORDER — MIDAZOLAM HCL 2 MG/2ML IJ SOLN
1.0000 mg | INTRAMUSCULAR | Status: DC | PRN
  Administered 2017-06-17: 2 mg via INTRAVENOUS

## 2017-06-17 MED ORDER — MIDAZOLAM HCL 2 MG/2ML IJ SOLN
INTRAMUSCULAR | Status: AC
  Filled 2017-06-17: qty 2

## 2017-06-17 MED ORDER — PROMETHAZINE HCL 25 MG/ML IJ SOLN: 6.2500 mg | INTRAMUSCULAR | Status: DC | PRN

## 2017-06-17 MED ORDER — FENTANYL CITRATE (PF) 100 MCG/2ML IJ SOLN
INTRAMUSCULAR | Status: AC
  Filled 2017-06-17: qty 2

## 2017-06-17 MED ORDER — LIDOCAINE HCL (CARDIAC) 20 MG/ML IV SOLN
INTRAVENOUS | Status: DC | PRN
  Administered 2017-06-17: 60 mg via INTRAVENOUS

## 2017-06-17 MED ORDER — ONDANSETRON HCL 4 MG/2ML IJ SOLN
INTRAMUSCULAR | Status: DC | PRN
  Administered 2017-06-17: 4 mg via INTRAVENOUS

## 2017-06-17 MED ORDER — BUPIVACAINE-EPINEPHRINE (PF) 0.5% -1:200000 IJ SOLN
INTRAMUSCULAR | Status: DC | PRN
  Administered 2017-06-17: 40 mL via PERINEURAL

## 2017-06-17 MED ORDER — 0.9 % SODIUM CHLORIDE (POUR BTL) OPTIME
TOPICAL | Status: DC | PRN
  Administered 2017-06-17: 600 mL

## 2017-06-17 MED ORDER — CHLORHEXIDINE GLUCONATE 4 % EX LIQD: 60.0000 mL | Freq: Once | CUTANEOUS | Status: DC

## 2017-06-17 MED ORDER — FENTANYL CITRATE (PF) 100 MCG/2ML IJ SOLN: 25.0000 ug | INTRAMUSCULAR | Status: DC | PRN

## 2017-06-17 SURGICAL SUPPLY — 76 items
ASMB FX 20X17 STPL INSRTR (Staple) ×1 IMPLANT
BANDAGE ESMARK 6X9 LF (GAUZE/BANDAGES/DRESSINGS) ×1 IMPLANT
BIT DRILL 4.8X300 (BIT) ×1 IMPLANT
BIT DRILL 4.8X300MM (BIT) ×1
BLADE AVERAGE 25MMX9MM (BLADE)
BLADE AVERAGE 25X9 (BLADE) IMPLANT
BLADE MICRO SAGITTAL (BLADE) IMPLANT
BLADE SURG 15 STRL LF DISP TIS (BLADE) ×2 IMPLANT
BLADE SURG 15 STRL SS (BLADE) ×9
BNDG CMPR 9X6 STRL LF SNTH (GAUZE/BANDAGES/DRESSINGS)
BNDG COHESIVE 4X5 TAN STRL (GAUZE/BANDAGES/DRESSINGS) ×3 IMPLANT
BNDG COHESIVE 6X5 TAN STRL LF (GAUZE/BANDAGES/DRESSINGS) ×3 IMPLANT
BNDG ESMARK 6X9 LF (GAUZE/BANDAGES/DRESSINGS)
CHLORAPREP W/TINT 26ML (MISCELLANEOUS) ×3 IMPLANT
COVER BACK TABLE 60X90IN (DRAPES) ×3 IMPLANT
CUFF TOURNIQUET SINGLE 34IN LL (TOURNIQUET CUFF) ×2 IMPLANT
CUFF TOURNIQUET SINGLE 44IN (TOURNIQUET CUFF) IMPLANT
DRAPE EXTREMITY T 121X128X90 (DRAPE) ×3 IMPLANT
DRAPE OEC MINIVIEW 54X84 (DRAPES) ×3 IMPLANT
DRAPE U-SHAPE 47X51 STRL (DRAPES) ×3 IMPLANT
DRSG MEPITEL 4X7.2 (GAUZE/BANDAGES/DRESSINGS) ×3 IMPLANT
DRSG PAD ABDOMINAL 8X10 ST (GAUZE/BANDAGES/DRESSINGS) ×6 IMPLANT
ELECT REM PT RETURN 9FT ADLT (ELECTROSURGICAL) ×3
ELECTRODE REM PT RTRN 9FT ADLT (ELECTROSURGICAL) ×1 IMPLANT
GAUZE SPONGE 4X4 12PLY STRL (GAUZE/BANDAGES/DRESSINGS) ×3 IMPLANT
GLOVE BIO SURGEON STRL SZ8 (GLOVE) ×3 IMPLANT
GLOVE BIOGEL PI IND STRL 7.0 (GLOVE) IMPLANT
GLOVE BIOGEL PI IND STRL 8 (GLOVE) ×2 IMPLANT
GLOVE BIOGEL PI INDICATOR 7.0 (GLOVE) ×4
GLOVE BIOGEL PI INDICATOR 8 (GLOVE) ×6
GLOVE ECLIPSE 7.0 STRL STRAW (GLOVE) ×2 IMPLANT
GLOVE ECLIPSE 8.0 STRL XLNG CF (GLOVE) ×5 IMPLANT
GOWN STRL REUS W/ TWL LRG LVL3 (GOWN DISPOSABLE) ×1 IMPLANT
GOWN STRL REUS W/ TWL XL LVL3 (GOWN DISPOSABLE) ×2 IMPLANT
GOWN STRL REUS W/TWL LRG LVL3 (GOWN DISPOSABLE) ×3
GOWN STRL REUS W/TWL XL LVL3 (GOWN DISPOSABLE) ×9
K-WIRE 9  SMOOTH .062 (WIRE) IMPLANT
KIT ARCUS SIZING TEMPLATE STRL (KITS) ×2 IMPLANT
KIT STAPLE ARCUS 20X17 STRL (Staple) ×2 IMPLANT
KWIRE 1.8 MM ×4 IMPLANT
NDL SAFETY ECLIPSE 18X1.5 (NEEDLE) IMPLANT
NEEDLE HYPO 18GX1.5 SHARP (NEEDLE)
NEEDLE HYPO 22GX1.5 SAFETY (NEEDLE) ×1 IMPLANT
PACK BASIN DAY SURGERY FS (CUSTOM PROCEDURE TRAY) ×3 IMPLANT
PAD CAST 4YDX4 CTTN HI CHSV (CAST SUPPLIES) ×1 IMPLANT
PADDING CAST ABS 4INX4YD NS (CAST SUPPLIES)
PADDING CAST ABS COTTON 4X4 ST (CAST SUPPLIES) IMPLANT
PADDING CAST COTTON 4X4 STRL (CAST SUPPLIES) ×3
PADDING CAST COTTON 6X4 STRL (CAST SUPPLIES) ×3 IMPLANT
PENCIL BUTTON HOLSTER BLD 10FT (ELECTRODE) ×3 IMPLANT
PIN GUIDE DRILL TIP 2.8X300 (DRILL) ×2 IMPLANT
SANITIZER HAND PURELL 535ML FO (MISCELLANEOUS) ×3 IMPLANT
SCREW 8.0X80MMX16 (Screw) ×1 IMPLANT
SCREW CANN 5.0X40MM (Screw) ×1 IMPLANT
SCREW FULL THREAD 8.0X75MM (Screw) ×2 IMPLANT
SHEET MEDIUM DRAPE 40X70 STRL (DRAPES) ×5 IMPLANT
SLEEVE SCD COMPRESS KNEE MED (MISCELLANEOUS) ×3 IMPLANT
SPLINT FAST PLASTER 5X30 (CAST SUPPLIES) ×40
SPLINT PLASTER CAST FAST 5X30 (CAST SUPPLIES) ×20 IMPLANT
SPONGE LAP 18X18 X RAY DECT (DISPOSABLE) ×3 IMPLANT
SPONGE SURGIFOAM ABS GEL 12-7 (HEMOSTASIS) IMPLANT
STAPLE ASSEMBLY ARCUS 20X17 (Staple) ×2 IMPLANT
STOCKINETTE 6  STRL (DRAPES) ×2
STOCKINETTE 6 STRL (DRAPES) ×1 IMPLANT
SUCTION FRAZIER HANDLE 10FR (MISCELLANEOUS) ×2
SUCTION TUBE FRAZIER 10FR DISP (MISCELLANEOUS) ×1 IMPLANT
SUT ETHILON 3 0 PS 1 (SUTURE) ×5 IMPLANT
SUT MNCRL AB 3-0 PS2 18 (SUTURE) ×3 IMPLANT
SUT VIC AB 0 SH 27 (SUTURE) IMPLANT
SUT VIC AB 2-0 SH 27 (SUTURE) ×3
SUT VIC AB 2-0 SH 27XBRD (SUTURE) ×1 IMPLANT
SYR BULB 3OZ (MISCELLANEOUS) ×3 IMPLANT
TOWEL OR 17X24 6PK STRL BLUE (TOWEL DISPOSABLE) ×6 IMPLANT
TUBE CONNECTING 20'X1/4 (TUBING) ×1
TUBE CONNECTING 20X1/4 (TUBING) ×2 IMPLANT
UNDERPAD 30X30 (UNDERPADS AND DIAPERS) ×3 IMPLANT

## 2017-06-17 NOTE — H&P (Signed)
Craig Scott is an 61 y.o. male.   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  He has failed non op treatment and presents today for subtalar and talonavicular joint arthrodesis.  Past Medical History:  Diagnosis Date  . Alcohol dependence, daily use ([place])   . Anxiety   . Arthritis    ra  . GERD (gastroesophageal reflux disease)   . High cholesterol   . Lynch syndrome    has gene that causes cancer  . Neuropathy    both feet  . Tubular adenoma     Past Surgical History:  Procedure Laterality Date  . BUNIONECTOMY WITH HAMMERTOE RECONSTRUCTION AND GASTROC SLIDE Right 06/27/2015   Procedure: RIGHT GASTROC RECESSION/POSTERIOR TIBIAL TENOLYSIS/SECOND AND THIRD METATARSAL WEIL OSTEOTOMY AND HAMMERTOE CORRECTION;  Surgeon: Wylene Simmer, MD;  Location: [place];  Service: Orthopedics;  Laterality: Right;  . CALCANEAL OSTEOTOMY Right 06/27/2015   Procedure: CALCANEAL OSTEOTOMY;  Surgeon: Wylene Simmer, MD;  Location: [place];  Service: Orthopedics;  Laterality: Right;  . COLONOSCOPY   12/09/01   RMR: Normal normal rectum/ A few scattered left-sided diverticula.  The remainder of the colonic  mucosa appeared normal  . COLONOSCOPY  12/15/2002   RMR: Normal rectum, scattered left-sided diverticula.  The remainder of the colonic mucosa appeared normal  . COLONOSCOPY  04/10/2004   RMR: Normal rectum/Sigmoid diverticula/The remainder of the colonic mucosa appeared normal  . COLONOSCOPY  12/23/2005   RMR: Normal rectum, scattered sigmoid diverticula Colonic mucosa appeared normal  . COLONOSCOPY    01/20/2008   RMR: Distal diminutive rectal polyps, status post cold biopsy removal  otherwise, normal rectum/ biopsy removal; scattered sigmoid diverticula; and benign-appearing/ulcers about the ileocecal valve, status post biopsy.  Remainder of colonic mucosa and terminal mucosa appeared normal.  . COLONOSCOPY  05/22/2010   RMR: distal  diminutive rectal polyp s/p bx otherwise normal/few scattered pancolonic diverticula  . COLONOSCOPY  09/16/2012   Dr.Rourk- normal rectum, few scattered pancolonic diverticulosis, one diminutive polyp in the base of the cecum. 3x3 area of hyper pigmentation i the mid descending segment, this was a flat benign-appearing area o/w the remainder of the colonic mucosa appeared normal. bx= tubular adenoma and benign colonic mucosa  . COLONOSCOPY WITH PROPOFOL N/A 09/05/2015   Procedure: COLONOSCOPY WITH PROPOFOL;  Surgeon: Daneil Dolin, MD;  [facility];  Service: Endoscopy;  Laterality: N/A;  0830  . COLONOSCOPY WITH PROPOFOL N/A 05/24/2017   Procedure: COLONOSCOPY WITH PROPOFOL;  Surgeon: Daneil Dolin, MD;  [facility];  Service: Endoscopy;  Laterality: N/A;  . ESOPHAGOGASTRODUODENOSCOPY  12/23/2005   RMR:   Esophagogastric peptic stricture with reflux esophagitis, status post  dilation as described above/ Small hiatal hernia, otherwise normal stomach.  Bulbar erosions otherwise normal D1 and D2  . EYE SURGERY  as child  . HARDWARE REMOVAL Right 06/25/2016   Procedure: REMOVAL OF DEEP IMPLANTS RIGHT MEDIAL CUNEIFORM, RIGHT CALCANEAL REMOVAL OF DEEP IMPLANTS;  Surgeon: Wylene Simmer, MD;  Location: [place];  Service: Orthopedics;  Laterality: Right;  . JOINT REPLACEMENT  09/30/2010   Right knee  . KNEE SURGERY Right    bilat   . NOSE SURGERY     cartiledge removal  . POLYPECTOMY  09/05/2015   Procedure: POLYPECTOMY;  Surgeon: Daneil Dolin, MD;  [facility];  Service: Endoscopy;;  . POLYPECTOMY  05/24/2017   Procedure: POLYPECTOMY;  Surgeon: Daneil Dolin, MD;  [facility];  Service: Endoscopy;;  colon  . RHINOPLASTY    . TOTAL HIP ARTHROPLASTY Left 03/20/2015   Procedure: LEFT TOTAL HIP ARTHROPLASTY ANTERIOR APPROACH;  Surgeon: Gaynelle Arabian, MD;  Location: [facility];  Service: Orthopedics;  Laterality: Left;  . TOTAL KNEE  ARTHROPLASTY Left 10/23/2013   Procedure: LEFT TOTAL KNEE ARTHROPLASTY;  Surgeon: [name], MD;  Location: [facility];  Service: Orthopedics;  Laterality: Left;  . TYMPANOSTOMY TUBE PLACEMENT Right    early 2014    Family History  Problem Relation Age of Onset  . Colon cancer Brother        86   . Colon cancer Brother        29  . Colon cancer Mother        age 30   . Colon cancer Cousin        3 years old onset  . Colon cancer Cousin        15 years onset   Social History:  reports that he quit smoking about 11 years ago. His smoking use included Cigars and Cigarettes. He has a 15.00 pack-year smoking history. He has never used smokeless tobacco. He reports that he drinks alcohol. He reports that he does not use drugs.  Allergies:  Allergies  Allergen Reactions  . Lyrica [Pregabalin] Other (See Comments)    "changes personality"  . Celebrex [Celecoxib] Rash    agitation     Medications Prior to Admission  Medication Sig Dispense Refill  . ALPRAZolam (XANAX) 1 MG tablet Take 1 mg by mouth 3 (three) times daily as needed for anxiety. anxiety    . aspirin EC 81 MG tablet Take 81 mg by mouth daily.    . Camphor-Eucalyptus-Menthol (VICKS VAPORUB EX) Apply 1 application topically 4 (four) times a week. On toenail    . Melatonin 3 MG TABS Take 3 mg by mouth at bedtime as needed (sleep).    . meloxicam (MOBIC) 15 MG tablet Take 15 mg by mouth daily.    . Misc Natural Products (GLUCOSAMINE CHOND MSM FORMULA) TABS Take 1 tablet by mouth daily.    . Multiple Vitamin (MULTIVITAMIN WITH MINERALS) TABS tablet Take 1 tablet by mouth daily.    . Omega-3 Fatty Acids (FISH OIL) 1000 MG CAPS Take 1,000 mg by mouth daily.    . Potassium 99 MG TABS Take 99 mg by mouth daily.    . simvastatin (ZOCOR) 20 MG tablet Take 20 mg by mouth daily.       No results found for this or any previous visit (from the past 48 hour(s)). No results found.  ROS  No recent f/c/n/v/wt loss  Blood pressure  121/69, pulse 73, temperature 97.6 F (36.4 C), temperature source Oral, resp. rate 13, height 6' (1.829 m), weight 105.2 kg (232 lb), SpO2 99 %. Physical Exam  wn wd male in nad.  A and O x 4.  Mood and affect normal.  EOMI.  resp unlabored.  R foot with healed surgical incisions and healthy skin.  No lymphadenopathy.  5/5 strength in PF and DF of the ankle and toes.  Sens to LT intact about the foot and ankle.  Decreased ROm in inversion and eversion.   Assessment/Plan R subtalar and talonavicular arthritis - to OR for arthrodesis of the TN and ST joints.  The risks and benefits of the alternative treatment options have been discussed in detail.  The patient wishes  to proceed with surgery and specifically understands risks of bleeding, infection, nerve damage, blood clots, need for additional surgery, amputation and death.   Wylene Simmer, MD 2017/06/20, 8:34 AM

## 2017-06-17 NOTE — Brief Op Note (Addendum)
06/17/2017  1:30 PM  PATIENT:  Craig Scott  61 y.o. male  PRE-OPERATIVE DIAGNOSIS:  Right talonavicular and subtalar arthritis   POST-OPERATIVE DIAGNOSIS:  1.   Right talonavicular and subtalar arthritis      2.  Right peroneus brevis tendon tear  Procedure(s): 1.  Right Talonavicular arthrodesis (separate incision)   2.  Right Subtalar Arthrodesis   3.  Right foot AP, lateral and harris heel xrays   4.  Right peroneus brevis tenolysis  SURGEON:  Wylene Simmer, MD  ASSISTANT: Mechele Claude, PA-C  ANESTHESIA:   General, regional  EBL:  minimal   TOURNIQUET:   Total Tourniquet Time Documented: Thigh (Right) - 90 minutes Total: Thigh (Right) - 90 minutes  COMPLICATIONS:  None apparent  DISPOSITION:  Extubated, awake and stable to recovery.  [id number]

## 2017-06-17 NOTE — Anesthesia Postprocedure Evaluation (Signed)
Anesthesia Post Note  Patient: Craig Scott  Procedure(s) Performed: Right Talonavicular and Subtalar Arthrodesis (Right Foot)     Patient location during evaluation: PACU Anesthesia Type: General Level of consciousness: awake and alert Pain management: pain level controlled Vital Signs Assessment: post-procedure vital signs reviewed and stable Respiratory status: spontaneous breathing, nonlabored ventilation and respiratory function stable Cardiovascular status: blood pressure returned to baseline and stable Postop Assessment: no apparent nausea or vomiting Anesthetic complications: no    Last Vitals:  Vitals:   06/17/17 1130 06/17/17 1159  BP: (!) 115/39 136/85  Pulse: 72 86  Resp: 14 18  Temp:  (!) 36.4 C  SpO2: 97% 98%    Last Pain:  Vitals:   06/17/17 1159  TempSrc: Oral  PainSc: 0-No pain                 Catalina Gravel

## 2017-06-17 NOTE — Discharge Instructions (Addendum)
John Hewitt, MD °[facility] ° °Please read the following information regarding your care after surgery. ° °Medications  °You only need a prescription for the narcotic pain medicine (ex. oxycodone, Percocet, Norco).  All of the other medicines listed below are available over the counter. °X Aleve 2 pills twice a day for the first 3 days after surgery. °X acetominophen (Tylenol) 650 mg every 4-6 hours as you need for minor to moderate pain °X oxycodone as prescribed for severe pain ° °Narcotic pain medicine (ex. oxycodone, Percocet, Vicodin) will cause constipation.  To prevent this problem, take the following medicines while you are taking any pain medicine. °X docusate sodium (Colace) 100 mg twice a day X senna (Senokot) 2 tablets twice a day ° °X To help prevent blood clots, take a baby aspirin (81 mg) twice a day after surgery.  You should also get up every hour while you are awake to move around.   ° °Weight Bearing °X Do not bear any weight on the operated leg or foot. ° °Cast / Splint / Dressing °X Keep your splint, cast or dressing clean and dry.  Don’t put anything (coat hanger, pencil, etc) down inside of it.  If it gets damp, use a hair dryer on the cool setting to dry it.  If it gets soaked, call the office to schedule an appointment for a cast change. ° °After your dressing, cast or splint is removed; you may shower, but do not soak or scrub the wound.  Allow the water to run over it, and then gently pat it dry. ° °Swelling °It is normal for you to have swelling where you had surgery.  To reduce swelling and pain, keep your toes above your nose for at least 3 days after surgery.  It may be necessary to keep your foot or leg elevated for several weeks.  If it hurts, it should be elevated. ° °Follow Up °Call my office at [phone] when you are discharged from the hospital or surgery center to schedule an appointment to be seen two weeks after surgery. ° °Call my office at [phone] if you  develop a fever >101.5° F, nausea, vomiting, bleeding from the surgical site or severe pain.   ° ° ° °Post Anesthesia Home Care Instructions ° °Activity: °Get plenty of rest for the remainder of the day. A responsible individual must stay with you for 24 hours following the procedure.  °For the next 24 hours, DO NOT: °-Drive a car °-Operate machinery °-Drink alcoholic beverages °-Take any medication unless instructed by your physician °-Make any legal decisions or sign important papers. ° °Meals: °Start with liquid foods such as gelatin or soup. Progress to regular foods as tolerated. Avoid greasy, spicy, heavy foods. If nausea and/or vomiting occur, drink only clear liquids until the nausea and/or vomiting subsides. Call your physician if vomiting continues. ° °Special Instructions/Symptoms: °Your throat may feel dry or sore from the anesthesia or the breathing tube placed in your throat during surgery. If this causes discomfort, gargle with warm salt water. The discomfort should disappear within 24 hours. ° °If you had a scopolamine patch placed behind your ear for the management of post- operative nausea and/or vomiting: ° °1. The medication in the patch is effective for 72 hours, after which it should be removed.  Wrap patch in a tissue and discard in the trash. Wash hands thoroughly with soap and water. °2. You may remove the patch earlier than 72 hours if you experience unpleasant side   effects which may include dry mouth, dizziness or visual disturbances. °3. Avoid touching the patch. Wash your hands with soap and water after contact with the patch. °  ° ° °Regional Anesthesia Blocks ° °1. Numbness or the inability to move the "blocked" extremity may last from 3-48 hours after placement. The length of time depends on the medication injected and your individual response to the medication. If the numbness is not going away after 48 hours, call your surgeon. ° °2. The extremity that is blocked will need to be  protected until the numbness is gone and the  Strength has returned. Because you cannot feel it, you will need to take extra care to avoid injury. Because it may be weak, you may have difficulty moving it or using it. You may not know what position it is in without looking at it while the block is in effect. ° °3. For blocks in the legs and feet, returning to weight bearing and walking needs to be done carefully. You will need to wait until the numbness is entirely gone and the strength has returned. You should be able to move your leg and foot normally before you try and bear weight or walk. You will need someone to be with you when you first try to ensure you do not fall and possibly risk injury. ° °4. Bruising and tenderness at the needle site are common side effects and will resolve in a few days. ° °5. Persistent numbness or new problems with movement should be communicated to the surgeon or the [facility] ([phone])/ [facility] ([phone]). °

## 2017-06-17 NOTE — Op Note (Signed)
NAME:  Craig Scott, Craig Scott              ACCOUNT NO.:  [id number]  MEDICAL RECORD NO.:  [id number]  LOCATION:                                 FACILITY:  PHYSICIAN:  Wylene Simmer, MD        DATE OF BIRTH:  1956/03/15  DATE OF PROCEDURE:  06/17/2017 DATE OF DISCHARGE:                              OPERATIVE REPORT   PREOPERATIVE DIAGNOSES: 1. Stage 3 posterior tibial tendon dysfunction. 2. Right talonavicular joint arthritis. 3. Right foot subtalar joint arthritis.  POSTOPERATIVE DIAGNOSES: 1. Stage 3 posterior tibial tendon dysfunction. 2. Right talonavicular joint arthritis. 3. Right foot subtalar joint arthritis. 4. Right peroneus brevis tendon tear.  PROCEDURE: 1. Right subtalar joint arthrodesis. 2. Right talonavicular joint arthrodesis through a separate incision. 3. Right foot AP, lateral, and Harris heel radiographs. 4. Debridement of the peroneus brevis tendon tear.  SURGEON:  Wylene Simmer, MD.  ASSISTANT:  Mechele Claude, PA-C.  ANESTHESIA:  General, regional.  ESTIMATED BLOOD LOSS:  Minimal.  TOURNIQUET TIME:  90 minutes at 250 mmHg.  COMPLICATIONS:  None apparent.  DISPOSITION:  Extubated, awake, and stable to recovery.  INDICATIONS FOR PROCEDURE:  The patient is a 61 year old male, who has a long history of right foot posterior tibial tendon dysfunction.  He has previously undergone an [employer] flatfoot reconstruction.  He has had progression of his flatfoot deformity as well as developing subtalar and talonavicular joint arthritis.  He presents now for operative treatment of these painful conditions having failed nonoperative treatment to date.  He understands the risks and benefits of the alternative treatment options and elects surgical treatment.  He specifically understands risks of bleeding, infection, nerve damage, blood clots, need for additional surgery, continued pain, nonunion, amputation, and death.  DESCRIPTION OF PROCEDURE:  After preoperative  consent was obtained and the correct operative site was identified, the patient was brought to the operating room and placed supine on the operating table.  General anesthesia was induced.  Preoperative antibiotics were administered. Surgical time-out was taken.  The right lower extremity was prepped and draped in standard sterile fashion with a tourniquet around the thigh. The extremity was exsanguinated and the tourniquet was inflated to 250 mmHg.  A curvilinear incision was then made at the lateral aspect of the hindfoot adjacent to the sinus tarsi.  Dissection was carried down through the skin and subcutaneous tissues.  The interval between the extensor digitorum brevis and the peroneal tendons was identified.  The extensor digitorum brevis was elevated and the sinus tarsi was exposed. The cervical ligament was divided.  A lamina spreader was used to open the subtalar joint.  The remaining articular cartilage and subchondral bone were removed with curettes and rongeurs.  Wound was irrigated copiously.  Both sides of the joint were then perforated with a drill bit and further broken up with a quarter-inch osteotome.  The resultant bone graft was left in place.  Attention was then turned to the dorsum of the hindfoot, where longitudinal incision was made.  Dissection was carried down through the skin and subcutaneous tissue.  The tibialis anterior and extensor hallucis longus were identified.  The interval between these tendons was developed and care  was taken to protect the neurovascular bundle.  The talonavicular joint was identified.  The joint capsule was incised and elevated medially and laterally.  Joint was opened with lamina spreader and the remaining articular cartilage and subchondral bone was removed with curettes and rongeurs.  Wound was irrigated copiously.  A drill bit was used to perforate both sides of the joint.  An osteotome was used to break it up further.  The  subtalar joint was then reduced.  A stab incision was made at the heel and a guide pin was inserted from the calcaneal tuberosity into the talar dome.  AP ankle and lateral ankle views showed appropriate position of this guide pin.  It was overdrilled and an 8 mm partially threaded [employer] cannulated screw was inserted.  It was noted to compress the posterior facet appropriately.  Attention was then turned to the neck of the talus at the dorsal incision.  A guidepin was inserted across the anterior facet and into the calcaneus.  AP foot, lateral foot, and Harris heel radiographs showed appropriate alignment of this guide pin.  Guide pin was overdrilled and a fully-threaded 8 mm screw was inserted.  It was noted to have excellent purchase.  The talonavicular joint was then reduced and provisionally pinned.  AP and lateral radiographs confirmed appropriate alignment of the guide pin.  A stab incision was made.  A 5 mm partially threaded [employer] screw was inserted.  It was noted to have excellent purchase and compressed the talonavicular joint appropriately.  Two 20 mm x 17 mm staples were then inserted across the talonavicular joint further compressing and stabilizing the joint.  Final AP foot, lateral foot, and Harris heel radiographs confirmed appropriate position and length of all hardware and appropriate reduction of the subtalar and talonavicular joints.  Both wounds were irrigated copiously.  Upon closing the lateral incision, a tear of the peroneus brevis was identified.  This appeared to be degenerative in nature and involved less than 50% of the tendon. Tenolysis was then performed with scissors removing the torn portion of the tendon in its entirety.  The peroneus longus was carefully inspected and was noted to be healthy with no evidence of tear.  Wound was irrigated again.  Deep subcutaneous tissues were approximated with Vicryl.  Superficial subcutaneous tissues  were approximated with Monocryl.  Skin incisions were closed with nylon.  Sterile dressings were applied, followed by a well-padded short-leg splint.  The patient was awakened from anesthesia and transported to the recovery room in stable condition.  FOLLOWUP PLAN:  The patient will be nonweightbearing on the right lower extremity.  Follow up with me in 2 weeks for suture removal and conversion to a short-leg cast.  Total nonweightbearing plan for 6 weeks.  Aspirin 81 mg p.o. b.i.d. for DVT prophylaxis.  Mechele Claude, PA-C, was present and scrubbed for the duration of the case.  His assistance was essential in positioning the patient, prepping and draping, gaining and maintaining exposure, performing the operation, closing and dressing the wounds, and applying the splint.  X-RAYS:  AP foot, lateral foot, and Harris heel radiographs were obtained on the right.  These show interval arthrodesis of the talonavicular and subtalar joints.  Hardware is appropriately positioned and of the appropriate length.  No other acute injuries are noted.     Wylene Simmer, MD     JH/MEDQ  D:  06/17/2017  T:  06/17/2017  Job:  [id number]

## 2017-06-17 NOTE — Anesthesia Procedure Notes (Signed)
Procedure Name: LMA Insertion Date/Time: 06/17/2017 8:57 AM Performed by: Belissa Kooy D Pre-anesthesia Checklist: Patient identified, Emergency Drugs available, Suction available and Patient being monitored Patient Re-evaluated:Patient Re-evaluated prior to induction Oxygen Delivery Method: Circle system utilized Preoxygenation: Pre-oxygenation with 100% oxygen Induction Type: IV induction Ventilation: Mask ventilation without difficulty LMA: LMA inserted LMA Size: 4.0 Number of attempts: 1 Airway Equipment and Method: Bite block Placement Confirmation: positive ETCO2 Tube secured with: Tape Dental Injury: Teeth and Oropharynx as per pre-operative assessment

## 2017-06-17 NOTE — Progress Notes (Signed)
Assisted Dr. Turk with right, ultrasound guided, popliteal/saphenous block. Side rails up, monitors on throughout procedure. See vital signs in flow sheet. Tolerated Procedure well. 

## 2017-06-17 NOTE — Transfer of Care (Signed)
Immediate Anesthesia Transfer of Care Note  Patient: Craig Scott  Procedure(s) Performed: Right Talonavicular and Subtalar Arthrodesis (Right Foot)  Patient Location: PACU  Anesthesia Type:GA combined with regional for post-op pain  Level of Consciousness: awake and patient cooperative  Airway & Oxygen Therapy: Patient Spontanous Breathing and Patient connected to face mask oxygen  Post-op Assessment: Report given to RN and Post -op Vital signs reviewed and stable  Post vital signs: Reviewed and stable  Last Vitals:  Vitals:   06/17/17 0815 06/17/17 0830  BP: 121/69 134/69  Pulse: 73 80  Resp: 13 15  Temp:    SpO2: 99% 97%    Last Pain:  Vitals:   06/17/17 0736  TempSrc: Oral  PainSc: 8          Complications: No apparent anesthesia complications

## 2017-06-17 NOTE — Anesthesia Procedure Notes (Signed)
Anesthesia Regional Block: Popliteal block   Pre-Anesthetic Checklist: ,, timeout performed, Correct Patient, Correct Site, Correct Laterality, Correct Procedure, Correct Position, site marked, Risks and benefits discussed,  Surgical consent,  Pre-op evaluation,  At surgeon's request and post-op pain management  Laterality: Right  Prep: chloraprep       Needles:  Injection technique: Single-shot  Needle Type: Echogenic Needle     Needle Length: 9cm  Needle Gauge: 21     Additional Needles:   Procedures:,,,, ultrasound used (permanent image in chart),,,,  Narrative:  Start time: 06/17/2017 8:00 AM End time: 06/17/2017 8:09 AM Injection made incrementally with aspirations every 5 mL.  Performed by: Personally  Anesthesiologist: Catalina Gravel  Additional Notes: No pain on injection. No increased resistance to injection. Injection made in 5cc increments.  Good needle visualization.  Patient tolerated procedure well.  Combined right Popliteal/saphenous nerve block.

## 2017-06-17 NOTE — Anesthesia Preprocedure Evaluation (Signed)
Anesthesia Evaluation  Patient identified by MRN, date of birth, ID band Patient awake    Reviewed: Allergy & Precautions, NPO status , Patient's Chart, lab work & pertinent test results  Airway Mallampati: II  TM Distance: >3 FB Neck ROM: Full    Dental  (+) Teeth Intact, Dental Advisory Given   Pulmonary former smoker,    Pulmonary exam normal breath sounds clear to auscultation       Cardiovascular negative cardio ROS Normal cardiovascular exam Rhythm:Regular Rate:Normal     Neuro/Psych PSYCHIATRIC DISORDERS Anxiety negative neurological ROS     GI/Hepatic negative GI ROS, (+)     substance abuse  alcohol use,   Endo/Other  negative endocrine ROS  Renal/GU negative Renal ROS     Musculoskeletal  (+) Arthritis ,   Abdominal   Peds  Hematology negative hematology ROS (+)   Anesthesia Other Findings Day of surgery medications reviewed with the patient.  Reproductive/Obstetrics                             Anesthesia Physical Anesthesia Plan  ASA: III  Anesthesia Plan: General   Post-op Pain Management:  Regional for Post-op pain   Induction: Intravenous  PONV Risk Score and Plan: 2 and Ondansetron and Dexamethasone  Airway Management Planned: LMA  Additional Equipment:   Intra-op Plan:   Post-operative Plan: Extubation in OR  Informed Consent: I have reviewed the patients History and Physical, chart, labs and discussed the procedure including the risks, benefits and alternatives for the proposed anesthesia with the patient or authorized representative who has indicated his/her understanding and acceptance.   Dental advisory given  Plan Discussed with: CRNA  Anesthesia Plan Comments: (Preop block.)        Anesthesia Quick Evaluation

## 2017-06-18 ENCOUNTER — Encounter (HOSPITAL_BASED_OUTPATIENT_CLINIC_OR_DEPARTMENT_OTHER): Payer: Self-pay | Admitting: Orthopedic Surgery

## 2018-03-31 NOTE — Progress Notes (Signed)
Office Visit Note  Patient: Craig Scott             Date of Birth: 1956-05-12           MRN: [id number]             PCP: Redmond School, MD Referring: Redmond School, MD Visit Date: 04/01/2018 Occupation: Investment banker, operational  Subjective:  Pain in multiple joints.   History of Present Illness: Craig Scott is a 62 y.o. male seen in consultation per request of his PCP.  According to patient about 6 to 7 years ago he started having pain in his hands.  Gradually the pain moved to his knee joints and feet.  He was initially on Celebrex but discontinued due to mood changes.  He tried meloxicam for a while and then switched to Celebrex again as meloxicam did not work.  He underwent bilateral total knee replacement and left total hip replacement by Dr. Maureen Ralphs.  Despite of surgery he continues to have ongoing pain and discomfort in his joints.  He also had pes planus and underwent right foot reconstruction surgery by Dr. Doran Durand.  He has had 3 surgeries on his right foot and is still continues to have discomfort.  He states most of his joints are painful.  He also complains of discomfort in his a spine.  He notices some swelling in his hands.  Activities of Daily Living:  Patient reports morning stiffness for 3 hours.   Patient Reports nocturnal pain.  Difficulty dressing/grooming: Reports Difficulty climbing stairs: Reports Difficulty getting out of chair: Reports Difficulty using hands for taps, buttons, cutlery, and/or writing: Reports  Review of Systems  Constitutional: Positive for fatigue. Negative for night sweats.  HENT: Negative for mouth sores, mouth dryness and nose dryness.   Eyes: Negative for redness and dryness.  Respiratory: Negative for shortness of breath and difficulty breathing.   Cardiovascular: Negative for chest pain, palpitations, hypertension, irregular heartbeat and swelling in legs/feet.  Gastrointestinal: Negative for blood in stool, constipation and  diarrhea.  Endocrine: Negative for increased urination.  Musculoskeletal: Positive for arthralgias, joint pain, joint swelling, myalgias, morning stiffness and myalgias. Negative for muscle weakness and muscle tenderness.  Skin: Negative for color change, rash, hair loss, nodules/bumps, skin tightness, ulcers and sensitivity to sunlight.  Allergic/Immunologic: Negative for susceptible to infections.  Neurological: Negative for dizziness, fainting, memory loss, night sweats and weakness ( ).  Hematological: Negative for swollen glands.  Psychiatric/Behavioral: Positive for sleep disturbance. Negative for depressed mood. The patient is not nervous/anxious.     PMFS History:  Patient Active Problem List   Diagnosis Date Noted  . History of colonic polyps   . Flatfoot 06/27/2015  . OA (osteoarthritis) of hip 03/20/2015  . Hyponatremia 10/24/2013  . OA (osteoarthritis) of knee 10/23/2013  . Hepatomegaly 09/01/2012  . Lynch syndrome 09/01/2012  . GERD (gastroesophageal reflux disease) 09/01/2012  . Knee pain 08/12/2011    Past Medical History:  Diagnosis Date  . Alcohol dependence, daily use ([place])   . Anxiety   . Arthritis    ra  . GERD (gastroesophageal reflux disease)   . High cholesterol   . Lynch syndrome    has gene that causes cancer  . Neuropathy    both feet  . Tubular adenoma     Family History  Problem Relation Age of Onset  . Colon cancer Brother        70   . Colon cancer Brother  52  . Colon cancer Mother        age 11   . Dementia Mother   . Arthritis Mother   . Heart attack Father   . Colon cancer Cousin        19 years old onset  . Colon cancer Cousin        55 years onset   Past Surgical History:  Procedure Laterality Date  . BASAL CELL CARCINOMA EXCISION    . BUNIONECTOMY WITH HAMMERTOE RECONSTRUCTION AND GASTROC SLIDE Right 06/27/2015   Procedure: RIGHT GASTROC RECESSION/POSTERIOR TIBIAL TENOLYSIS/SECOND AND THIRD METATARSAL WEIL OSTEOTOMY  AND HAMMERTOE CORRECTION;  Surgeon: Wylene Simmer, MD;  Location: [facility];  Service: Orthopedics;  Laterality: Right;  . CALCANEAL OSTEOTOMY Right 06/27/2015   Procedure: CALCANEAL OSTEOTOMY;  Surgeon: Wylene Simmer, MD;  Location: [facility];  Service: Orthopedics;  Laterality: Right;  . COLONOSCOPY   12/09/01   RMR: Normal normal rectum/ A few scattered left-sided diverticula.  The remainder of the colonic  mucosa appeared normal  . COLONOSCOPY  12/15/2002   RMR: Normal rectum, scattered left-sided diverticula.  The remainder of the colonic mucosa appeared normal  . COLONOSCOPY  04/10/2004   RMR: Normal rectum/Sigmoid diverticula/The remainder of the colonic mucosa appeared normal  . COLONOSCOPY  12/23/2005   RMR: Normal rectum, scattered sigmoid diverticula Colonic mucosa appeared normal  . COLONOSCOPY    01/20/2008   RMR: Distal diminutive rectal polyps, status post cold biopsy removal  otherwise, normal rectum/ biopsy removal; scattered sigmoid diverticula; and benign-appearing/ulcers about the ileocecal valve, status post biopsy.  Remainder of colonic mucosa and terminal mucosa appeared normal.  . COLONOSCOPY  05/22/2010   RMR: distal diminutive rectal polyp s/p bx otherwise normal/few scattered pancolonic diverticula  . COLONOSCOPY  09/16/2012   Dr.Rourk- normal rectum, few scattered pancolonic diverticulosis, one diminutive polyp in the base of the cecum. 3x3 area of hyper pigmentation i the mid descending segment, this was a flat benign-appearing area o/w the remainder of the colonic mucosa appeared normal. bx= tubular adenoma and benign colonic mucosa  . COLONOSCOPY WITH PROPOFOL N/A 09/05/2015   Procedure: COLONOSCOPY WITH PROPOFOL;  Surgeon: Daneil Dolin, MD;  [facility];  Service: Endoscopy;  Laterality: N/A;  0830  . COLONOSCOPY WITH PROPOFOL N/A 05/24/2017   Procedure: COLONOSCOPY WITH PROPOFOL;  Surgeon: Daneil Dolin, MD;  [facility];  Service: Endoscopy;  Laterality: N/A;  . ESOPHAGOGASTRODUODENOSCOPY  12/23/2005   RMR:   Esophagogastric peptic stricture with reflux esophagitis, status post  dilation as described above/ Small hiatal hernia, otherwise normal stomach.  Bulbar erosions otherwise normal D1 and D2  . EYE SURGERY  as child  . FOOT ARTHRODESIS Right 06/17/2017   Procedure: Right Talonavicular and Subtalar Arthrodesis;  Surgeon: Wylene Simmer, MD;  Location: [facility];  Service: Orthopedics;  Laterality: Right;  . HARDWARE REMOVAL Right 06/25/2016   Procedure: REMOVAL OF DEEP IMPLANTS RIGHT MEDIAL CUNEIFORM, RIGHT CALCANEAL REMOVAL OF DEEP IMPLANTS;  Surgeon: Wylene Simmer, MD;  Location: [facility];  Service: Orthopedics;  Laterality: Right;  . JOINT REPLACEMENT  09/30/2010   Right knee  . KNEE SURGERY Right    bilat   . NOSE SURGERY     cartiledge removal  . POLYPECTOMY  09/05/2015   Procedure: POLYPECTOMY;  Surgeon: Daneil Dolin, MD;  [facility];  Service: Endoscopy;;  . POLYPECTOMY  05/24/2017   Procedure: POLYPECTOMY;  Surgeon: Gala Romney,  [name], MD;  [facility];  Service: Endoscopy;;  colon  . RHINOPLASTY    . TOTAL HIP ARTHROPLASTY Left 03/20/2015   Procedure: LEFT TOTAL HIP ARTHROPLASTY ANTERIOR APPROACH;  Surgeon: Gaynelle Arabian, MD;  Location: [facility];  Service: Orthopedics;  Laterality: Left;  . TOTAL KNEE ARTHROPLASTY Left 10/23/2013   Procedure: LEFT TOTAL KNEE ARTHROPLASTY;  Surgeon: [name], MD;  Location: [facility];  Service: Orthopedics;  Laterality: Left;  . TYMPANOSTOMY TUBE PLACEMENT Right    early 2014   Social History   Social History Narrative  . Not on file    Objective: Vital Signs: BP 136/84 (BP Location: Right Arm, Patient Position: Sitting, Cuff Size: Large)   Pulse 79   Resp 15   Ht 6' (1.829 m)   Wt 244 lb (110.7 kg)   BMI 33.09 kg/m    Physical Exam  Constitutional: He is oriented to person, place, and  time. He appears well-developed and well-nourished.  HENT:  Head: Normocephalic and atraumatic.  Eyes: Pupils are equal, round, and reactive to light. Conjunctivae and EOM are normal.  Neck: Normal range of motion. Neck supple.  Cardiovascular: Normal rate, regular rhythm and normal heart sounds.  Pulmonary/Chest: Effort normal and breath sounds normal.  Abdominal: Soft. Bowel sounds are normal.  Neurological: He is alert and oriented to person, place, and time.  Skin: Skin is warm and dry. Capillary refill takes less than 2 seconds.  Psychiatric: He has a normal mood and affect. His behavior is normal.  Nursing note and vitals reviewed.    Musculoskeletal Exam: C-spine limited range of motion.  He had thoracic kyphosis and some thoracic scoliosis.  Lumbar spine limited range of motion.  He has been full range of motion of bilateral shoulders.  Elbow joints with good range of motion.  Wrist joints with limited range of motion.  He had DIP and PIP thickening bilaterally.  No synovitis was noted.  Left total hip replacement is doing well.  He has bilateral total knee replacement.  He has a right foot reconstruction which causes discomfort.  He has some tenderness across sounds PIPs in his feet.   CDAI Exam: No CDAI exam completed.   Investigation: No additional findings.   Imaging: Xr Foot 2 Views Left  Result Date: 04/01/2018 PIP and DIP narrowing was noted.  No MTP changes or intertarsal joint space narrowing was noted.  No erosive changes were noted.  A small inferior calcaneal spur was noted. Impression: These findings are consistent with osteoarthritis of the foot.  Xr Hand 2 View Left  Result Date: 04/01/2018 Left first second and third MCP narrowing was noted.  Severe PIP and DIP narrowing and CMC narrowing was noted.  No erosive changes were noted.  No intercarpal radiocarpal joint space narrowing was noted. Impression: These findings are consistent with severe osteoarthritis.   Although narrowing of the MCP joints could be associated with inflammatory osteoarthritis or crystal induced arthropathy or autoimmune disease.  Xr Hand 2 View Right  Result Date: 04/01/2018 Right second and third MCP severe narrowing was noted.  All PIP and DIP narrowing was noted.  CMC narrowing was noted.  No intercarpal radiocarpal joint space narrowing was noted.  No erosive changes were noted. Impression: These findings are consistent with osteoarthritis.  MCP narrowing could be associated with crystal induced arthropathy or inflammatory arthritis.  Xr Lumbar Spine 2-3 Views  Result Date: 04/01/2018 No significant disc space narrowing was noted.  Facet joint arthropathy  was noted.  No syndesmophytes were noted. Impression: These findings are consistent with spondylosis and facet joint arthropathy.  Xr Pelvis 1-2 Views  Result Date: 04/01/2018 No SI joint narrowing or sclerosis was noted.   Recent Labs: Lab Results  Component Value Date   WBC 5.3 04/18/2016   HGB 15.2 04/18/2016   PLT 215 04/18/2016   NA 133 (L) 04/18/2016   K 3.7 04/18/2016   CL 104 04/18/2016   CO2 24 04/18/2016   GLUCOSE 161 (H) 04/18/2016   BUN 15 04/18/2016   CREATININE 0.75 04/18/2016   BILITOT 0.3 05/14/2017   ALKPHOS 87 05/14/2017   AST 36 05/14/2017   ALT 42 05/14/2017   PROT 7.5 05/14/2017   ALBUMIN 5.0 (H) 05/14/2017   CALCIUM 8.5 (L) 04/18/2016   GFRAA >60 04/18/2016    Speciality Comments: No specialty comments available.  Procedures:  No procedures performed Allergies: Lyrica [pregabalin]   Assessment / Plan:     Visit Diagnoses: Polyarthralgia -patient complains of pain in multiple joints.  He had no synovitis on examination.- Plan: Uric acid, HLA-B27 antigen, Sedimentation rate, TSH, Rheumatoid factor, Cyclic citrul peptide antibody, IgG  Pain in both hands -on clinical exam patient has findings of osteoarthritis without any synovitis.  Plan: XR Hand 2 View Right, XR Hand 2 View  Left.  The x-rays reveal osteoarthritis.  He also has significant bilateral second and third MCP joint narrowing which could be seen with inflammatory arthritis or crystal induced arthropathy.  I will obtain following labs today.  Status post left hip replacement-doing well.  Status post bilateral knee replacements-he is not having much discomfort in his knee joints currently.  Pain in both feet he had right foot reconstruction surgery in the past which is still causes discomfort.-He has been having left foot discomfort as well.  I did not see any synovitis on examination.  Plan: XR Foot 2 Views Left.  The x-ray revealed only osteoarthritic changes.  Pes planus of both feet  Chronic pain syndrome  Chronic midline low back pain without sciatica - Plan: XR Lumbar Spine 2-3 Views, XR Pelvis 1-2 Views.  The x-ray of the lumbar spine and the pelvis showed only mild spondylosis and no SI joint changes.  No syndesmophytes were noted.  History of neuropathy  Lynch syndrome  History of hyperlipidemia  Other insomnia  History of gastroesophageal reflux (GERD)  History of anxiety  History of colonic polyps  Hepatomegaly   Orders: Orders Placed This Encounter  Procedures  . XR Hand 2 View Right  . XR Hand 2 View Left  . XR Lumbar Spine 2-3 Views  . XR Pelvis 1-2 Views  . XR Foot 2 Views Left  . Uric acid  . HLA-B27 antigen  . Sedimentation rate  . TSH  . Rheumatoid factor  . Cyclic citrul peptide antibody, IgG  . Iron, TIBC and Ferritin Panel   No orders of the defined types were placed in this encounter.   Face-to-face time spent with patient was 50 minutes. Greater than 50% of time was spent in counseling and coordination of care.  Follow-Up Instructions: Return for polyarthralgia.   Bo Merino, MD  Note - This record has been created using Editor, commissioning.  Chart creation errors have been sought, but may not always  have been located. Such creation errors do not  reflect on  the standard of medical care.

## 2018-04-01 ENCOUNTER — Ambulatory Visit (INDEPENDENT_AMBULATORY_CARE_PROVIDER_SITE_OTHER): Payer: BC Managed Care – PPO

## 2018-04-01 ENCOUNTER — Ambulatory Visit (INDEPENDENT_AMBULATORY_CARE_PROVIDER_SITE_OTHER): Payer: Self-pay

## 2018-04-01 ENCOUNTER — Encounter: Payer: Self-pay | Admitting: Rheumatology

## 2018-04-01 ENCOUNTER — Ambulatory Visit: Payer: BC Managed Care – PPO | Admitting: Rheumatology

## 2018-04-01 VITALS — BP 136/84 | HR 79 | Resp 15 | Ht 72.0 in | Wt 244.0 lb

## 2018-04-01 DIAGNOSIS — Z96653 Presence of artificial knee joint, bilateral: Secondary | ICD-10-CM

## 2018-04-01 DIAGNOSIS — M2550 Pain in unspecified joint: Secondary | ICD-10-CM | POA: Diagnosis not present

## 2018-04-01 DIAGNOSIS — Z1509 Genetic susceptibility to other malignant neoplasm: Secondary | ICD-10-CM

## 2018-04-01 DIAGNOSIS — M79671 Pain in right foot: Secondary | ICD-10-CM | POA: Diagnosis not present

## 2018-04-01 DIAGNOSIS — G4709 Other insomnia: Secondary | ICD-10-CM | POA: Diagnosis not present

## 2018-04-01 DIAGNOSIS — M79672 Pain in left foot: Secondary | ICD-10-CM

## 2018-04-01 DIAGNOSIS — Z96642 Presence of left artificial hip joint: Secondary | ICD-10-CM

## 2018-04-01 DIAGNOSIS — M545 Low back pain: Secondary | ICD-10-CM | POA: Diagnosis not present

## 2018-04-01 DIAGNOSIS — M2141 Flat foot [pes planus] (acquired), right foot: Secondary | ICD-10-CM

## 2018-04-01 DIAGNOSIS — M79641 Pain in right hand: Secondary | ICD-10-CM

## 2018-04-01 DIAGNOSIS — G894 Chronic pain syndrome: Secondary | ICD-10-CM

## 2018-04-01 DIAGNOSIS — M2142 Flat foot [pes planus] (acquired), left foot: Secondary | ICD-10-CM

## 2018-04-01 DIAGNOSIS — Z8669 Personal history of other diseases of the nervous system and sense organs: Secondary | ICD-10-CM

## 2018-04-01 DIAGNOSIS — Z86010 Personal history of colonic polyps: Secondary | ICD-10-CM

## 2018-04-01 DIAGNOSIS — G8929 Other chronic pain: Secondary | ICD-10-CM

## 2018-04-01 DIAGNOSIS — Z8719 Personal history of other diseases of the digestive system: Secondary | ICD-10-CM

## 2018-04-01 DIAGNOSIS — R160 Hepatomegaly, not elsewhere classified: Secondary | ICD-10-CM

## 2018-04-01 DIAGNOSIS — M79642 Pain in left hand: Secondary | ICD-10-CM

## 2018-04-01 DIAGNOSIS — Z8639 Personal history of other endocrine, nutritional and metabolic disease: Secondary | ICD-10-CM | POA: Diagnosis not present

## 2018-04-01 DIAGNOSIS — Z8659 Personal history of other mental and behavioral disorders: Secondary | ICD-10-CM

## 2018-04-04 LAB — IRON,TIBC AND FERRITIN PANEL
%SAT: 16 % (calc) — ABNORMAL LOW (ref 20–48)
Ferritin: 383 ng/mL — ABNORMAL HIGH (ref 24–380)
Iron: 58 ug/dL (ref 50–180)
TIBC: 357 mcg/dL (calc) (ref 250–425)

## 2018-04-04 LAB — URIC ACID: Uric Acid, Serum: 5.2 mg/dL (ref 4.0–8.0)

## 2018-04-04 LAB — SEDIMENTATION RATE: SED RATE: 2 mm/h (ref 0–20)

## 2018-04-04 LAB — CYCLIC CITRUL PEPTIDE ANTIBODY, IGG: Cyclic Citrullin Peptide Ab: 20 UNITS — ABNORMAL HIGH

## 2018-04-04 LAB — RHEUMATOID FACTOR

## 2018-04-04 LAB — HLA-B27 ANTIGEN: HLA-B27 Antigen: NEGATIVE

## 2018-04-04 LAB — TSH: TSH: 2.96 mIU/L (ref 0.40–4.50)

## 2018-04-05 NOTE — Progress Notes (Signed)
CCP is low positive could be associated with rheumatoid arthritis.  Please a schedule ultrasound of bilateral hands to look for synovitis.

## 2018-04-12 ENCOUNTER — Telehealth: Payer: Self-pay | Admitting: *Deleted

## 2018-04-12 NOTE — Telephone Encounter (Signed)
I called patient with lab results, appt scheduled for [place] of BIL hands

## 2018-04-20 DIAGNOSIS — F5101 Primary insomnia: Secondary | ICD-10-CM | POA: Insufficient documentation

## 2018-04-20 DIAGNOSIS — M2550 Pain in unspecified joint: Secondary | ICD-10-CM | POA: Insufficient documentation

## 2018-04-20 DIAGNOSIS — M19071 Primary osteoarthritis, right ankle and foot: Secondary | ICD-10-CM | POA: Insufficient documentation

## 2018-04-20 DIAGNOSIS — Z96653 Presence of artificial knee joint, bilateral: Secondary | ICD-10-CM | POA: Insufficient documentation

## 2018-04-20 DIAGNOSIS — Z8659 Personal history of other mental and behavioral disorders: Secondary | ICD-10-CM | POA: Insufficient documentation

## 2018-04-20 DIAGNOSIS — M19072 Primary osteoarthritis, left ankle and foot: Secondary | ICD-10-CM

## 2018-04-20 DIAGNOSIS — G894 Chronic pain syndrome: Secondary | ICD-10-CM | POA: Insufficient documentation

## 2018-04-20 DIAGNOSIS — Z96642 Presence of left artificial hip joint: Secondary | ICD-10-CM | POA: Insufficient documentation

## 2018-04-20 NOTE — Progress Notes (Deleted)
Office Visit Note  Patient: Craig Scott             Date of Birth: [birth year]           MRN: [id number]             PCP: Redmond School, MD Referring: Redmond School, MD Visit Date: 05/04/2018 Occupation: _0 @  Subjective:  No chief complaint on file.   History of Present Illness: Craig Scott is a 62 y.o. male ***   Activities of Daily Living:  Patient reports morning stiffness for *** {minute/hour:19697}.   Patient {ACTIONS;DENIES/REPORTS:21021675::"Denies"} nocturnal pain.  Difficulty dressing/grooming: {ACTIONS;DENIES/REPORTS:21021675::"Denies"} Difficulty climbing stairs: {ACTIONS;DENIES/REPORTS:21021675::"Denies"} Difficulty getting out of chair: {ACTIONS;DENIES/REPORTS:21021675::"Denies"} Difficulty using hands for taps, buttons, cutlery, and/or writing: {ACTIONS;DENIES/REPORTS:21021675::"Denies"}  No Rheumatology ROS completed.   PMFS History:  Patient Active Problem List   Diagnosis Date Noted  . History of colonic polyps   . Flatfoot 06/27/2015  . OA (osteoarthritis) of hip 03/20/2015  . Hyponatremia 10/24/2013  . OA (osteoarthritis) of knee 10/23/2013  . Hepatomegaly 09/01/2012  . Lynch syndrome 09/01/2012  . GERD (gastroesophageal reflux disease) 09/01/2012  . Knee pain 08/12/2011    Past Medical History:  Diagnosis Date  . Alcohol dependence, daily use ([place])   . Anxiety   . Arthritis    ra  . GERD (gastroesophageal reflux disease)   . High cholesterol   . Lynch syndrome    has gene that causes cancer  . Neuropathy    both feet  . Tubular adenoma     Family History  Problem Relation Age of Onset  . Colon cancer Brother        58   . Colon cancer Brother        74  . Colon cancer Mother        age 84   . Dementia Mother   . Arthritis Mother   . Heart attack Father   . Colon cancer Cousin        2 years old onset  . Colon cancer Cousin        41 years onset   Past Surgical History:  Procedure Laterality Date  . BASAL  CELL CARCINOMA EXCISION    . BUNIONECTOMY WITH HAMMERTOE RECONSTRUCTION AND GASTROC SLIDE Right 06/27/2015   Procedure: RIGHT GASTROC RECESSION/POSTERIOR TIBIAL TENOLYSIS/SECOND AND THIRD METATARSAL WEIL OSTEOTOMY AND HAMMERTOE CORRECTION;  Surgeon: Wylene Simmer, MD;  Location: [place];  Service: Orthopedics;  Laterality: Right;  . CALCANEAL OSTEOTOMY Right 06/27/2015   Procedure: CALCANEAL OSTEOTOMY;  Surgeon: Wylene Simmer, MD;  Location: [place];  Service: Orthopedics;  Laterality: Right;  . COLONOSCOPY   12/09/01   RMR: Normal normal rectum/ A few scattered left-sided diverticula.  The remainder of the colonic  mucosa appeared normal  . COLONOSCOPY  12/15/2002   RMR: Normal rectum, scattered left-sided diverticula.  The remainder of the colonic mucosa appeared normal  . COLONOSCOPY  04/10/2004   RMR: Normal rectum/Sigmoid diverticula/The remainder of the colonic mucosa appeared normal  . COLONOSCOPY  12/23/2005   RMR: Normal rectum, scattered sigmoid diverticula Colonic mucosa appeared normal  . COLONOSCOPY    01/20/2008   RMR: Distal diminutive rectal polyps, status post cold biopsy removal  otherwise, normal rectum/ biopsy removal; scattered sigmoid diverticula; and benign-appearing/ulcers about the ileocecal valve, status post biopsy.  Remainder of colonic mucosa and terminal mucosa appeared normal.  . COLONOSCOPY  05/22/2010   RMR: distal diminutive rectal polyp s/p bx otherwise normal/few  scattered pancolonic diverticula  . COLONOSCOPY  09/16/2012   Dr.Rourk- normal rectum, few scattered pancolonic diverticulosis, one diminutive polyp in the base of the cecum. 3x3 area of hyper pigmentation i the mid descending segment, this was a flat benign-appearing area o/w the remainder of the colonic mucosa appeared normal. bx= tubular adenoma and benign colonic mucosa  . COLONOSCOPY WITH PROPOFOL N/A 09/05/2015   Procedure: COLONOSCOPY WITH PROPOFOL;  Surgeon: Daneil Dolin, MD;  [facility];  Service: Endoscopy;  Laterality: N/A;  0830  . COLONOSCOPY WITH PROPOFOL N/A 05/24/2017   Procedure: COLONOSCOPY WITH PROPOFOL;  Surgeon: Daneil Dolin, MD;  [facility];  Service: Endoscopy;  Laterality: N/A;  . ESOPHAGOGASTRODUODENOSCOPY  12/23/2005   RMR:   Esophagogastric peptic stricture with reflux esophagitis, status post  dilation as described above/ Small hiatal hernia, otherwise normal stomach.  Bulbar erosions otherwise normal D1 and D2  . EYE SURGERY  as child  . FOOT ARTHRODESIS Right 06/17/2017   Procedure: Right Talonavicular and Subtalar Arthrodesis;  Surgeon: Wylene Simmer, MD;  Location: [place];  Service: Orthopedics;  Laterality: Right;  . HARDWARE REMOVAL Right 06/25/2016   Procedure: REMOVAL OF DEEP IMPLANTS RIGHT MEDIAL CUNEIFORM, RIGHT CALCANEAL REMOVAL OF DEEP IMPLANTS;  Surgeon: Wylene Simmer, MD;  Location: [place];  Service: Orthopedics;  Laterality: Right;  . JOINT REPLACEMENT  09/30/2010   Right knee  . KNEE SURGERY Right    bilat   . NOSE SURGERY     cartiledge removal  . POLYPECTOMY  09/05/2015   Procedure: POLYPECTOMY;  Surgeon: Daneil Dolin, MD;  [facility];  Service: Endoscopy;;  . POLYPECTOMY  05/24/2017   Procedure: POLYPECTOMY;  Surgeon: Daneil Dolin, MD;  [facility];  Service: Endoscopy;;  colon  . RHINOPLASTY    . TOTAL HIP ARTHROPLASTY Left 03/20/2015   Procedure: LEFT TOTAL HIP ARTHROPLASTY ANTERIOR APPROACH;  Surgeon: Gaynelle Arabian, MD;  Location[facility];  Service: Orthopedics;  Laterality: Left;  . TOTAL KNEE ARTHROPLASTY Left 10/23/2013   Procedure: LEFT TOTAL KNEE ARTHROPLASTY;  Surgeon: [name], MD;  Location: [facility];  Service: Orthopedics;  Laterality: Left;  . TYMPANOSTOMY TUBE PLACEMENT Right    early 2014   Social History   Social History Narrative  . Not on file    Objective: Vital Signs: There were no vitals taken  for this visit.   Physical Exam   Musculoskeletal Exam: ***  CDAI Exam: No CDAI exam completed.   Investigation: No additional findings.  Imaging: Xr Foot 2 Views Left  Result Date: 04/01/2018 PIP and DIP narrowing was noted.  No MTP changes or intertarsal joint space narrowing was noted.  No erosive changes were noted.  A small inferior calcaneal spur was noted. Impression: These findings are consistent with osteoarthritis of the foot.  Xr Hand 2 View Left  Result Date: 04/01/2018 Left first second and third MCP narrowing was noted.  Severe PIP and DIP narrowing and CMC narrowing was noted.  No erosive changes were noted.  No intercarpal radiocarpal joint space narrowing was noted. Impression: These findings are consistent with severe osteoarthritis.  Although narrowing of the MCP joints could be associated with inflammatory osteoarthritis or crystal induced arthropathy or autoimmune disease.  Xr Hand 2 View Right  Result Date: 04/01/2018 Right second and third MCP severe narrowing was noted.  All PIP and DIP narrowing was noted.  CMC narrowing was noted.  No  intercarpal radiocarpal joint space narrowing was noted.  No erosive changes were noted. Impression: These findings are consistent with osteoarthritis.  MCP narrowing could be associated with crystal induced arthropathy or inflammatory arthritis.  Xr Lumbar Spine 2-3 Views  Result Date: 04/01/2018 No significant disc space narrowing was noted.  Facet joint arthropathy was noted.  No syndesmophytes were noted. Impression: These findings are consistent with spondylosis and facet joint arthropathy.  Xr Pelvis 1-2 Views  Result Date: 04/01/2018 No SI joint narrowing or sclerosis was noted.   Recent Labs: Lab Results  Component Value Date   WBC 5.3 04/18/2016   HGB 15.2 04/18/2016   PLT 215 04/18/2016   NA 133 (L) 04/18/2016   K 3.7 04/18/2016   CL 104 04/18/2016   CO2 24 04/18/2016   GLUCOSE 161 (H) 04/18/2016   BUN 15  04/18/2016   CREATININE 0.75 04/18/2016   BILITOT 0.3 05/14/2017   ALKPHOS 87 05/14/2017   AST 36 05/14/2017   ALT 42 05/14/2017   PROT 7.5 05/14/2017   ALBUMIN 5.0 (H) 05/14/2017   CALCIUM 8.5 (L) 04/18/2016   GFRAA >60 04/18/2016   April 01, 2018 iron percent saturation 16 low, ferritin 383 high, TSH normal, ESR 2, uric acid 5.2, RF negative, anti-CCP weak positive, HLA-B27 negative Speciality Comments: No specialty comments available.  Procedures:  No procedures performed Allergies: Lyrica [pregabalin]   Assessment / Plan:     Visit Diagnoses: No diagnosis found.   Orders: No orders of the defined types were placed in this encounter.  No orders of the defined types were placed in this encounter.   Face-to-face time spent with patient was *** minutes. Greater than 50% of time was spent in counseling and coordination of care.  Follow-Up Instructions: No follow-ups on file.   Bo Merino, MD  Note - This record has been created using Editor, commissioning.  Chart creation errors have been sought, but may not always  have been located. Such creation errors do not reflect on  the standard of medical care.

## 2018-04-27 ENCOUNTER — Ambulatory Visit (INDEPENDENT_AMBULATORY_CARE_PROVIDER_SITE_OTHER): Payer: BC Managed Care – PPO | Admitting: Rheumatology

## 2018-04-27 ENCOUNTER — Ambulatory Visit (INDEPENDENT_AMBULATORY_CARE_PROVIDER_SITE_OTHER): Payer: Self-pay

## 2018-04-27 DIAGNOSIS — Z79899 Other long term (current) drug therapy: Secondary | ICD-10-CM | POA: Diagnosis not present

## 2018-04-27 DIAGNOSIS — M79642 Pain in left hand: Secondary | ICD-10-CM

## 2018-04-27 DIAGNOSIS — M79641 Pain in right hand: Secondary | ICD-10-CM | POA: Diagnosis not present

## 2018-04-27 NOTE — Progress Notes (Signed)
Office Visit Note  Patient: Craig Scott             Date of Birth: 04-21-1956           MRN: [id number]             PCP: Redmond School, MD Referring: Redmond School, MD Visit Date: 04/29/2018 Occupation: @GUAROCC @  Subjective:  Pain in multiple joints   History of Present Illness: Craig Scott is a 62 y.o. male with history of inflammatory arthritis.  Patient had a ultrasound of bilateral hands on 04/27/18 that revealed synovitis.  He reports that he is having pain in multiple joints including bilateral hands bilateral wrists bilateral elbows bilateral shoulders, bilateral hips, bilateral knees, bilateral feet.  He states that he has chronic pain in bilateral knee replacements and will be following up with Dr. Wynelle Link soon.  He states the pain is most severe in his left foot and ankle.  He states that he follows up with Dr. Doran Durand regularly.  He states he continues to have chronic pain in his left hip replacement as well.  He states that he notices swelling in bilateral hands bilateral wrists and bilateral feet.    Activities of Daily Living:  Patient reports morning stiffness for 3 hours.   Patient Reports nocturnal pain.  Difficulty dressing/grooming: Reports Difficulty climbing stairs: Reports Difficulty getting out of chair: Reports Difficulty using hands for taps, buttons, cutlery, and/or writing: Reports  Review of Systems  Constitutional: Positive for fatigue. Negative for night sweats.  HENT: Negative for mouth sores, trouble swallowing, trouble swallowing, mouth dryness and nose dryness.   Eyes: Negative for redness, visual disturbance and dryness.  Respiratory: Negative for cough, hemoptysis, shortness of breath and difficulty breathing.   Cardiovascular: Positive for swelling in legs/feet. Negative for chest pain, palpitations, hypertension and irregular heartbeat.  Gastrointestinal: Negative for blood in stool, constipation and diarrhea.  Endocrine: Negative  for increased urination.  Genitourinary: Negative for difficulty urinating and painful urination.  Musculoskeletal: Positive for arthralgias, gait problem, joint pain, joint swelling, muscle weakness, morning stiffness and muscle tenderness. Negative for myalgias and myalgias.  Skin: Negative for color change, rash, hair loss, nodules/bumps, skin tightness, ulcers and sensitivity to sunlight.  Allergic/Immunologic: Negative for susceptible to infections.  Neurological: Negative for dizziness, fainting, memory loss and night sweats.  Hematological: Negative for bruising/bleeding tendency and swollen glands.  Psychiatric/Behavioral: Positive for sleep disturbance. Negative for depressed mood. The patient is not nervous/anxious.     PMFS History:  Patient Active Problem List   Diagnosis Date Noted  . Polyarthralgia 04/20/2018  . Status post total knee replacement, bilateral 04/20/2018  . Status post total hip replacement, left 04/20/2018  . Primary osteoarthritis of both feet 04/20/2018  . Chronic pain syndrome 04/20/2018  . 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 History of anxiety 04/20/2018  . History of colonic polyps   . Flat foot 06/27/2015  . OA (osteoarthritis) of hip 03/20/2015  . Hyponatremia 10/24/2013  . OA (osteoarthritis) of knee 10/23/2013  . Hepatomegaly 09/01/2012  . Lynch syndrome 09/01/2012  . GERD (gastroesophageal reflux disease) 09/01/2012  . Knee pain 08/12/2011    Past Medical History:  Diagnosis Date  . Alcohol dependence, daily use ([place])   . Anxiety   . Arthritis    ra  . GERD (gastroesophageal reflux disease)   . High cholesterol   . Lynch syndrome    has gene that causes cancer  . Neuropathy    both feet  .  Tubular adenoma     Family History  Problem Relation Age of Onset  . Colon cancer Brother        72   . Colon cancer Brother        85  . Colon cancer Mother        age 59   . Dementia Mother   . Arthritis Mother   . Heart attack Father   .  Colon cancer Cousin        61 years old onset  . Colon cancer Cousin        39 years onset   Past Surgical History:  Procedure Laterality Date  . BASAL CELL CARCINOMA EXCISION    . BUNIONECTOMY WITH HAMMERTOE RECONSTRUCTION AND GASTROC SLIDE Right 06/27/2015   Procedure: RIGHT GASTROC RECESSION/POSTERIOR TIBIAL TENOLYSIS/SECOND AND THIRD METATARSAL WEIL OSTEOTOMY AND HAMMERTOE CORRECTION;  Surgeon: Wylene Simmer, MD;  Location: [place];  Service: Orthopedics;  Laterality: Right;  . CALCANEAL OSTEOTOMY Right 06/27/2015   Procedure: CALCANEAL OSTEOTOMY;  Surgeon: Wylene Simmer, MD;  Location: [place];  Service: Orthopedics;  Laterality: Right;  . COLONOSCOPY   12/09/01   RMR: Normal normal rectum/ A few scattered left-sided diverticula.  The remainder of the colonic  mucosa appeared normal  . COLONOSCOPY  12/15/2002   RMR: Normal rectum, scattered left-sided diverticula.  The remainder of the colonic mucosa appeared normal  . COLONOSCOPY  04/10/2004   RMR: Normal rectum/Sigmoid diverticula/The remainder of the colonic mucosa appeared normal  . COLONOSCOPY  12/23/2005   RMR: Normal rectum, scattered sigmoid diverticula Colonic mucosa appeared normal  . COLONOSCOPY    01/20/2008   RMR: Distal diminutive rectal polyps, status post cold biopsy removal  otherwise, normal rectum/ biopsy removal; scattered sigmoid diverticula; and benign-appearing/ulcers about the ileocecal valve, status post biopsy.  Remainder of colonic mucosa and terminal mucosa appeared normal.  . COLONOSCOPY  05/22/2010   RMR: distal diminutive rectal polyp s/p bx otherwise normal/few scattered pancolonic diverticula  . COLONOSCOPY  09/16/2012   Dr.Rourk- normal rectum, few scattered pancolonic diverticulosis, one diminutive polyp in the base of the cecum. 3x3 area of hyper pigmentation i the mid descending segment, this was a flat benign-appearing area o/w the remainder of the colonic mucosa  appeared normal. bx= tubular adenoma and benign colonic mucosa  . COLONOSCOPY WITH PROPOFOL N/A 09/05/2015   Procedure: COLONOSCOPY WITH PROPOFOL;  Surgeon: Daneil Dolin, MD;  [facility];  Service: Endoscopy;  Laterality: N/A;  0830  . COLONOSCOPY WITH PROPOFOL N/A 05/24/2017   Procedure: COLONOSCOPY WITH PROPOFOL;  Surgeon: Daneil Dolin, MD;  [facility];  Service: Endoscopy;  Laterality: N/A;  . ESOPHAGOGASTRODUODENOSCOPY  12/23/2005   RMR:   Esophagogastric peptic stricture with reflux esophagitis, status post  dilation as described above/ Small hiatal hernia, otherwise normal stomach.  Bulbar erosions otherwise normal D1 and D2  . EYE SURGERY  as child  . FOOT ARTHRODESIS Right 06/17/2017   Procedure: Right Talonavicular and Subtalar Arthrodesis;  Surgeon: Wylene Simmer, MD;  Location: [place];  Service: Orthopedics;  Laterality: Right;  . HARDWARE REMOVAL Right 06/25/2016   Procedure: REMOVAL OF DEEP IMPLANTS RIGHT MEDIAL CUNEIFORM, RIGHT CALCANEAL REMOVAL OF DEEP IMPLANTS;  Surgeon: Wylene Simmer, MD;  Location: [place];  Service: Orthopedics;  Laterality: Right;  . JOINT REPLACEMENT  09/30/2010   Right knee  . KNEE SURGERY Right    bilat   . NOSE SURGERY  cartiledge removal  . POLYPECTOMY  09/05/2015   Procedure: POLYPECTOMY;  Surgeon: Daneil Dolin, MD;  [facility];  Service: Endoscopy;;  . POLYPECTOMY  05/24/2017   Procedure: POLYPECTOMY;  Surgeon: Daneil Dolin, MD;  [facility];  Service: Endoscopy;;  colon  . RHINOPLASTY    . TOTAL HIP ARTHROPLASTY Left 03/20/2015   Procedure: LEFT TOTAL HIP ARTHROPLASTY ANTERIOR APPROACH;  Surgeon: Gaynelle Arabian, MD;  [facility];  Service: Orthopedics;  Laterality: Left;  . TOTAL KNEE ARTHROPLASTY Left 10/23/2013   Procedure: LEFT TOTAL KNEE ARTHROPLASTY;  Surgeon: [name], MD;  Location[facility];  Service: Orthopedics;  Laterality: Left;  .  TYMPANOSTOMY TUBE PLACEMENT Right    early 2014  . TYMPANOSTOMY TUBE PLACEMENT     Social History   Social History Narrative  . Not on file    Objective: Vital Signs: BP 127/82 (BP Location: Left Arm, Patient Position: Sitting, Cuff Size: Normal)   Pulse 86   Resp 16   Ht 6' (1.829 m)   Wt 240 lb (108.9 kg)   BMI 32.55 kg/m    Physical Exam  Constitutional: He is oriented to person, place, and time. He appears well-developed and well-nourished.  HENT:  Head: Normocephalic and atraumatic.  Eyes: Pupils are equal, round, and reactive to light. Conjunctivae and EOM are normal.  Neck: Normal range of motion. Neck supple.  Cardiovascular: Normal rate, regular rhythm and normal heart sounds.  Pulmonary/Chest: Effort normal and breath sounds normal.  Abdominal: Soft. Bowel sounds are normal.  Lymphadenopathy:    He has no cervical adenopathy.  Neurological: He is alert and oriented to person, place, and time.  Skin: Skin is warm and dry. Capillary refill takes less than 2 seconds.  Psychiatric: He has a normal mood and affect. His behavior is normal.  Nursing note and vitals reviewed.    Musculoskeletal Exam: C-spine limited range of motion.  Thoracic kyphosis and scoliosis noted.  Limited range of motion of lumbar spine.  Full range of motion bilateral shoulders with discomfort.  Elbow joints good range of motion with no synovitis.  Limited range of motion of bilateral wrist joints.  He has DIP and PIP synovial thickening.  Knee replacements good range of motion.  Warmth of right knee replacement on exam.  Right foot reconstruction causes severe pain.  CDAI Exam: CDAI Score: 1.6  Patient Global Assessment: 8 (mm); Provider Global Assessment: 8 (mm) Swollen: 0 ; Tender: 0  Joint Exam   Not documented   There is currently no information documented on the homunculus. Go to the Rheumatology activity and complete the homunculus joint exam.  Investigation: No additional  findings.  Imaging: [place] Extrem Up Bilat Comp  Result Date: 04/27/2018 Ultrasound examination of bilateral hands was performed per EULAR recommendations. Using 12 MHz transducer, grayscale and power Doppler bilateral second, third, and fifth MCP joints and bilateral wrist joints both dorsal and volar aspects were evaluated to look for synovitis or tenosynovitis. The findings were there was  synovitis bilateral second and third MCP joints, right fifth MCP, bilateral wrist joints on ultrasound examination. Right median nerve was 0.10 cm squares which was within normal limits and left median nerve was 0.10 cm squares which was within normal limits. 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  Xr Foot 2 Views Left  Result Date: 04/01/2018 PIP and DIP narrowing was noted.  No MTP changes or intertarsal joint space narrowing was noted.  No erosive  changes were noted.  A small inferior calcaneal spur was noted. Impression: These findings are consistent with osteoarthritis of the foot.  Xr Hand 2 View Left  Result Date: 04/01/2018 Left first second and third MCP narrowing was noted.  Severe PIP and DIP narrowing and CMC narrowing was noted.  No erosive changes were noted.  No intercarpal radiocarpal joint space narrowing was noted. Impression: These findings are consistent with severe osteoarthritis.  Although narrowing of the MCP joints could be associated with inflammatory osteoarthritis or crystal induced arthropathy or autoimmune disease.  Xr Hand 2 View Right  Result Date: 04/01/2018 Right second and third MCP severe narrowing was noted.  All PIP and DIP narrowing was noted.  CMC narrowing was noted.  No intercarpal radiocarpal joint space narrowing was noted.  No erosive changes were noted. Impression: These findings are consistent with osteoarthritis.  MCP narrowing could be associated with crystal induced arthropathy or inflammatory arthritis.  Xr Lumbar Spine 2-3  Views  Result Date: 04/01/2018 No significant disc space narrowing was noted.  Facet joint arthropathy was noted.  No syndesmophytes were noted. Impression: These findings are consistent with spondylosis and facet joint arthropathy.  Xr Pelvis 1-2 Views  Result Date: 04/01/2018 No SI joint narrowing or sclerosis was noted.   Recent Labs: Lab Results  Component Value Date   WBC 6.0 04/27/2018   HGB 16.2 04/27/2018   PLT 265 04/27/2018   NA 139 04/27/2018   K 4.3 04/27/2018   CL 101 04/27/2018   CO2 29 04/27/2018   GLUCOSE 110 (H) 04/27/2018   BUN 15 04/27/2018   CREATININE 1.10 04/27/2018   BILITOT 0.6 04/27/2018   ALKPHOS 87 05/14/2017   AST 27 04/27/2018   ALT 33 04/27/2018   PROT 7.6 04/27/2018   PROT 7.5 04/27/2018   ALBUMIN 5.0 (H) 05/14/2017   CALCIUM 9.9 04/27/2018   GFRAA 84 04/27/2018    Speciality Comments: No specialty comments available.  Procedures:  No procedures performed Allergies: Lyrica [pregabalin]   Assessment / Plan:     Visit Diagnoses: Inflammatory arthritis - U/S 04/27/18 revealed synovitis: He has chronic pain in both hands, wrists, elbows, hips, knees, and feet. He takes Celebrex 200 mg po daily. He has synovial thickening of all PIP and DIP joints.  He had an ultrasound performed on 04/27/18 of bilateral hands that revealed mild synovitis.  Indications, contraindications, and potential side effects were discussed.  Consent was obtained today.  He was given a PLQ eye exam form today in the office.  All questions were addressed.  He will follow up in 3 months.  He was advised to notify us if he develops increased joint pain or joint swelling.   High risk medication use: Indications, contraindications, potential side effects of Plaquenil were discussed today.  G6PD was within normal limits on 04/27/2018.  CBC and CMP are within normal limits on 04/27/2018.  He will return in 1 month and then 3 months, then every 5 months. Once all of his labs are back we  will send in a prescription for Plaquenil.  Status post left hip replacement: Chronic pain.  Status post bilateral knee replacements: He has good range of motion.  Right knee warmth on exam.  He has chronic pain in bilateral knee replacements.  He will be following up with Dr. Wynelle Link soon.  Pes planus of both feet: Chronic pain in both feet.   Other medical conditions are listed as follows:  History of neuropathy  History of hyperlipidemia  Lynch syndrome  History of gastroesophageal reflux (GERD)  History of anxiety  History of colonic polyps   Orders: No orders of the defined types were placed in this encounter.  No orders of the defined types were placed in this encounter.   Face-to-face time spent with patient was 30 minutes.  She has tenderness on multiple joints on examination today.  He had positive synovitis on the ultrasound.  Greater than 50% of time was spent in counseling and coordination of care.  Follow-Up Instructions: Return for Inflammatory arthritis.   Ofilia Neas, PA-C I examined and evaluated the patient with Hazel Sams PA. The plan of care was discussed as noted above.  Bo Merino, MD Note - This record has been created using Editor, commissioning.  Chart creation errors have been sought, but may not always  have been located. Such creation errors do not reflect on  the standard of medical care.

## 2018-04-27 NOTE — Progress Notes (Signed)
Ultrasound examination of bilateral hands was performed per EULAR recommendations. Using 12 MHz transducer, grayscale and power Doppler bilateral second, third, and fifth MCP joints and bilateral wrist joints both dorsal and volar aspects were evaluated to look for synovitis or tenosynovitis. The findings were there was  synovitis bilateral second and third MCP joints, right fifth MCP, bilateral wrist joints on ultrasound examination. Right median nerve was 0.10 cm squares which was within normal limits and left median nerve was 0.10 cm squares which was within normal limits.  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  His most recent labs showed low titer positive CCP antibody.  I will obtain  labs today to start him on immunosuppressive therapy. Bo Merino, MD

## 2018-04-28 NOTE — Progress Notes (Signed)
Will discuss at the follow-up visit

## 2018-04-29 ENCOUNTER — Ambulatory Visit: Payer: BC Managed Care – PPO | Admitting: Rheumatology

## 2018-04-29 ENCOUNTER — Encounter: Payer: Self-pay | Admitting: Physician Assistant

## 2018-04-29 VITALS — BP 127/82 | HR 86 | Resp 16 | Ht 72.0 in | Wt 240.0 lb

## 2018-04-29 DIAGNOSIS — M1990 Unspecified osteoarthritis, unspecified site: Secondary | ICD-10-CM | POA: Diagnosis not present

## 2018-04-29 DIAGNOSIS — Z96642 Presence of left artificial hip joint: Secondary | ICD-10-CM | POA: Diagnosis not present

## 2018-04-29 DIAGNOSIS — Z1509 Genetic susceptibility to other malignant neoplasm: Secondary | ICD-10-CM

## 2018-04-29 DIAGNOSIS — Z8719 Personal history of other diseases of the digestive system: Secondary | ICD-10-CM

## 2018-04-29 DIAGNOSIS — Z96653 Presence of artificial knee joint, bilateral: Secondary | ICD-10-CM | POA: Diagnosis not present

## 2018-04-29 DIAGNOSIS — Z8659 Personal history of other mental and behavioral disorders: Secondary | ICD-10-CM

## 2018-04-29 DIAGNOSIS — M2142 Flat foot [pes planus] (acquired), left foot: Secondary | ICD-10-CM

## 2018-04-29 DIAGNOSIS — Z79899 Other long term (current) drug therapy: Secondary | ICD-10-CM

## 2018-04-29 DIAGNOSIS — Z8669 Personal history of other diseases of the nervous system and sense organs: Secondary | ICD-10-CM

## 2018-04-29 DIAGNOSIS — Z86010 Personal history of colonic polyps: Secondary | ICD-10-CM

## 2018-04-29 DIAGNOSIS — Z8639 Personal history of other endocrine, nutritional and metabolic disease: Secondary | ICD-10-CM

## 2018-04-29 DIAGNOSIS — M2141 Flat foot [pes planus] (acquired), right foot: Secondary | ICD-10-CM

## 2018-04-29 LAB — PROTEIN ELECTROPHORESIS, SERUM, WITH REFLEX
ALPHA 2: 0.7 g/dL (ref 0.5–0.9)
Albumin ELP: 4.7 g/dL (ref 3.8–4.8)
Alpha 1: 0.3 g/dL (ref 0.2–0.3)
Beta 2: 0.4 g/dL (ref 0.2–0.5)
Beta Globulin: 0.5 g/dL (ref 0.4–0.6)
Gamma Globulin: 1 g/dL (ref 0.8–1.7)
TOTAL PROTEIN: 7.5 g/dL (ref 6.1–8.1)

## 2018-04-29 LAB — CBC WITH DIFFERENTIAL/PLATELET
BASOS ABS: 48 {cells}/uL (ref 0–200)
Basophils Relative: 0.8 %
Eosinophils Absolute: 258 cells/uL (ref 15–500)
Eosinophils Relative: 4.3 %
HEMATOCRIT: 46.9 % (ref 38.5–50.0)
HEMOGLOBIN: 16.2 g/dL (ref 13.2–17.1)
LYMPHS ABS: 1110 {cells}/uL (ref 850–3900)
MCH: 30.6 pg (ref 27.0–33.0)
MCHC: 34.5 g/dL (ref 32.0–36.0)
MCV: 88.5 fL (ref 80.0–100.0)
MPV: 9.7 fL (ref 7.5–12.5)
Monocytes Relative: 9.1 %
NEUTROS ABS: 4038 {cells}/uL (ref 1500–7800)
NEUTROS PCT: 67.3 %
Platelets: 265 10*3/uL (ref 140–400)
RBC: 5.3 10*6/uL (ref 4.20–5.80)
RDW: 12.4 % (ref 11.0–15.0)
Total Lymphocyte: 18.5 %
WBC: 6 10*3/uL (ref 3.8–10.8)
WBCMIX: 546 {cells}/uL (ref 200–950)

## 2018-04-29 LAB — COMPLETE METABOLIC PANEL WITH GFR
AG RATIO: 1.8 (calc) (ref 1.0–2.5)
ALBUMIN MSPROF: 4.9 g/dL (ref 3.6–5.1)
ALT: 33 U/L (ref 9–46)
AST: 27 U/L (ref 10–35)
Alkaline phosphatase (APISO): 92 U/L (ref 40–115)
BUN: 15 mg/dL (ref 7–25)
CALCIUM: 9.9 mg/dL (ref 8.6–10.3)
CO2: 29 mmol/L (ref 20–32)
CREATININE: 1.1 mg/dL (ref 0.70–1.25)
Chloride: 101 mmol/L (ref 98–110)
GFR, EST NON AFRICAN AMERICAN: 72 mL/min/{1.73_m2} (ref >=60)
GFR, Est African American: 84 mL/min/{1.73_m2} (ref >=60)
GLOBULIN: 2.7 g/dL (ref 1.9–3.7)
Glucose, Bld: 110 mg/dL — ABNORMAL HIGH (ref 65–99)
POTASSIUM: 4.3 mmol/L (ref 3.5–5.3)
SODIUM: 139 mmol/L (ref 135–146)
Total Bilirubin: 0.6 mg/dL (ref 0.2–1.2)
Total Protein: 7.6 g/dL (ref 6.1–8.1)

## 2018-04-29 LAB — CK: Total CK: 258 U/L — ABNORMAL HIGH (ref 44–196)

## 2018-04-29 LAB — QUANTIFERON-TB GOLD PLUS
NIL: 0.04 [IU]/mL
QUANTIFERON-TB GOLD PLUS: NEGATIVE
TB1-NIL: 0 [IU]/mL
TB2-NIL: 0.01 [IU]/mL

## 2018-04-29 LAB — HIV ANTIBODY (ROUTINE TESTING W REFLEX): HIV 1&2 Ab, 4th Generation: NONREACTIVE

## 2018-04-29 LAB — IGG, IGA, IGM
IgG (Immunoglobin G), Serum: 1068 mg/dL (ref 600–1540)
IgM, Serum: 101 mg/dL (ref 50–300)
Immunoglobulin A: 282 mg/dL (ref 20–320)

## 2018-04-29 LAB — GLUCOSE 6 PHOSPHATE DEHYDROGENASE: G-6PDH: 15.3 U/g{Hb} (ref 7.0–20.5)

## 2018-04-29 NOTE — Patient Instructions (Addendum)
Hydroxychloroquine tablets What is this medicine? HYDROXYCHLOROQUINE (hye drox ee KLOR oh kwin) is used to treat rheumatoid arthritis and systemic lupus erythematosus. It is also used to treat malaria. This medicine may be used for other purposes; ask your health care provider or pharmacist if you have questions. COMMON BRAND NAME(S): Plaquenil, Quineprox What should I tell my health care provider before I take this medicine? They need to know if you have any of these conditions: -diabetes -eye disease, vision problems -G6PD deficiency -history of blood diseases -history of irregular heartbeat -if you often drink alcohol -kidney disease -liver disease -porphyria -psoriasis -seizures -an unusual or allergic reaction to chloroquine, hydroxychloroquine, other medicines, foods, dyes, or preservatives -pregnant or trying to get pregnant -breast-feeding How should I use this medicine? Take this medicine by mouth with a glass of water. Follow the directions on the prescription label. Avoid taking antacids within 4 hours of taking this medicine. It is best to separate these medicines by at least 4 hours. Do not cut, crush or chew this medicine. You can take it with or without food. If it upsets your stomach, take it with food. Take your medicine at regular intervals. Do not take your medicine more often than directed. Take all of your medicine as directed even if you think you are better. Do not skip doses or stop your medicine early. Talk to your pediatrician regarding the use of this medicine in children. While this drug may be prescribed for selected conditions, precautions do apply. Overdosage: If you think you have taken too much of this medicine contact a poison control center or emergency room at once. NOTE: This medicine is only for you. Do not share this medicine with others. What if I miss a dose? If you miss a dose, take it as soon as you can. If it is almost time for your next dose,  take only that dose. Do not take double or extra doses. What may interact with this medicine? Do not take this medicine with any of the following medications: -cisapride -dofetilide -dronedarone -live virus vaccines -penicillamine -pimozide -thioridazine -ziprasidone This medicine may also interact with the following medications: -ampicillin -antacids -cimetidine -cyclosporine -digoxin -medicines for diabetes, like insulin, glipizide, glyburide -medicines for seizures like carbamazepine, phenobarbital, phenytoin -mefloquine -methotrexate -other medicines that prolong the QT interval (cause an abnormal heart rhythm) -praziquantel This list may not describe all possible interactions. Give your health care provider a list of all the medicines, herbs, non-prescription drugs, or dietary supplements you use. Also tell them if you smoke, drink alcohol, or use illegal drugs. Some items may interact with your medicine. What should I watch for while using this medicine? Tell your doctor or healthcare professional if your symptoms do not start to get better or if they get worse. Avoid taking antacids within 4 hours of taking this medicine. It is best to separate these medicines by at least 4 hours. Tell your doctor or health care professional right away if you have any change in your eyesight. Your vision and blood may be tested before and during use of this medicine. This medicine can make you more sensitive to the sun. Keep out of the sun. If you cannot avoid being in the sun, wear protective clothing and use sunscreen. Do not use sun lamps or tanning beds/booths. What side effects may I notice from receiving this medicine? Side effects that you should report to your doctor or health care professional as soon as possible: -allergic reactions like skin rash,   itching or hives, swelling of the face, lips, or tongue -changes in vision -decreased hearing or ringing of the ears -redness,  blistering, peeling or loosening of the skin, including inside the mouth -seizures -sensitivity to light -signs and symptoms of a dangerous change in heartbeat or heart rhythm like chest pain; dizziness; fast or irregular heartbeat; palpitations; feeling faint or lightheaded, falls; breathing problems -signs and symptoms of liver injury like dark yellow or brown urine; general ill feeling or flu-like symptoms; light-colored stools; loss of appetite; nausea; right upper belly pain; unusually weak or tired; yellowing of the eyes or skin -signs and symptoms of low blood sugar such as feeling anxious; confusion; dizziness; increased hunger; unusually weak or tired; sweating; shakiness; cold; irritable; headache; blurred vision; fast heartbeat; loss of consciousness -uncontrollable head, mouth, neck, arm, or leg movements Side effects that usually do not require medical attention (report to your doctor or health care professional if they continue or are bothersome): -anxious -diarrhea -dizziness -hair loss -headache -irritable -loss of appetite -nausea, vomiting -stomach pain This list may not describe all possible side effects. Call your doctor for medical advice about side effects. You may report side effects to FDA at 1-800-FDA-1088. Where should I keep my medicine? Keep out of the reach of children. In children, this medicine can cause overdose with small doses. Store at room temperature between 15 and 30 degrees C (59 and 86 degrees F). Protect from moisture and light. Throw away any unused medicine after the expiration date. NOTE: This sheet is a summary. It may not cover all possible information. If you have questions about this medicine, talk to your doctor, pharmacist, or health care provider.  2018 Elsevier/Gold Standard (2016-04-15 14:16:15)   Standing Labs We placed an order today for your standing lab work.    Please come back and get your standing labs in 1 month, 3 months, then  every 5 months   We have open lab Monday through Friday from 8:30-11:30 AM and 1:30-4:00 PM  at the office of Dr. Bo Merino.   You may experience shorter wait times on Monday and Friday afternoons. The office is located at [address] [place], [place], [place] [address] No appointment is necessary.   Labs are drawn by [facility].  You may receive a bill from [facility] for your lab work. If you have any questions regarding directions or hours of operation,  please call [phone].

## 2018-05-03 ENCOUNTER — Telehealth: Payer: Self-pay | Admitting: *Deleted

## 2018-05-03 MED ORDER — HYDROXYCHLOROQUINE SULFATE 200 MG PO TABS: 200.0000 mg | ORAL_TABLET | Freq: Two times a day (BID) | ORAL | 0 refills | Status: DC

## 2018-05-03 NOTE — Telephone Encounter (Signed)
-----   Message from Ofilia Neas, PA-C sent at 05/02/2018  8:52 AM EDT ----- Glucose is 110.  Pt was most likely not fasting. CK mildly elevated.  All other labs are WNL.   Ok to send in Plaquenil 200 mg 1 tablet by mouth BID.  Please advise pt to return for CBC and CMP in 1 month.

## 2018-05-04 ENCOUNTER — Ambulatory Visit: Payer: BC Managed Care – PPO | Admitting: Rheumatology

## 2018-05-05 ENCOUNTER — Other Ambulatory Visit: Payer: Self-pay | Admitting: Student

## 2018-05-05 DIAGNOSIS — M13871 Other specified arthritis, right ankle and foot: Secondary | ICD-10-CM

## 2018-06-03 ENCOUNTER — Telehealth: Payer: Self-pay | Admitting: Rheumatology

## 2018-06-03 ENCOUNTER — Ambulatory Visit
Admission: RE | Admit: 2018-06-03 | Discharge: 2018-06-03 | Disposition: A | Discharge status: Home / Self Care | Payer: BC Managed Care – PPO | Source: Ambulatory Visit | Attending: Student | Admitting: Student

## 2018-06-03 DIAGNOSIS — M13871 Other specified arthritis, right ankle and foot: Secondary | ICD-10-CM

## 2018-06-03 MED ORDER — HYDROXYCHLOROQUINE SULFATE 200 MG PO TABS: 200.0000 mg | ORAL_TABLET | Freq: Two times a day (BID) | ORAL | 2 refills | Status: DC

## 2018-06-03 NOTE — Telephone Encounter (Signed)
Patient needs refill on generic Plaquenil sent to [facility].

## 2018-06-03 NOTE — Telephone Encounter (Signed)
Last Visit: 04/29/18 Next Visit: 08/05/18 Labs: 04/27/18 Glucose is 110. Pt was most likely not fasting. CK mildly elevated. All other labs are WNL.  PLQ Eye Exam: 05/21/18 WNL   Okay to refill per Dr. Estanislado Pandy

## 2018-07-22 NOTE — Progress Notes (Signed)
Office Visit Note  Patient: Craig Scott             Date of Birth: [birth year]           MRN: [id number]             PCP: Redmond School, MD Referring: Redmond School, MD Visit Date: 08/05/2018 Occupation: @GUAROCC @  Subjective:  Pain in multiple joints    History of Present Illness: Craig Scott is a 62 y.o. male  with history of inflammatory arthritis.  Patient is taking PLQ 200 mg 1 tablet by mouth BID.  He continues to have pain in multiple joints including bilateral hands, bilateral wrists, bilateral shoulders, bilateral knee joints, bilateral feet.  He has noticed no swelling in bilateral hands.  He states that he does not feel as the fact that has been affected.  He has not noticed any benefit since starting on Plaquenil.  He continues to have chronic lower back pain.  He recently had an x-ray and will be going for an MRI soon.  He states that he be following up with Dr. being. Hand and swelling in both hands     Activities of Daily Living:  Patient reports morning stiffness all day.   Patient Reports nocturnal pain.  Difficulty dressing/grooming: Reports Difficulty climbing stairs: Reports Difficulty getting out of chair: Reports Difficulty using hands for taps, buttons, cutlery, and/or writing: Reports  Review of Systems  Constitutional: Positive for fatigue. Negative for night sweats.  HENT: Positive for mouth sores. Negative for trouble swallowing, trouble swallowing, mouth dryness and nose dryness.   Eyes: Negative for redness and dryness.  Respiratory: Negative for cough, hemoptysis, shortness of breath and difficulty breathing.   Cardiovascular: Positive for swelling in legs/feet. Negative for chest pain, palpitations, hypertension and irregular heartbeat.  Gastrointestinal: Negative for blood in stool, constipation and diarrhea.  Endocrine: Negative for increased urination.  Genitourinary: Negative for painful urination.  Musculoskeletal: Positive for  arthralgias, joint pain, joint swelling, muscle weakness and morning stiffness. Negative for myalgias, muscle tenderness and myalgias.  Skin: Negative for color change, rash, hair loss, nodules/bumps, skin tightness, ulcers and sensitivity to sunlight.  Allergic/Immunologic: Negative for susceptible to infections.  Neurological: Negative for dizziness, fainting, memory loss and night sweats.  Hematological: Negative for bruising/bleeding tendency and swollen glands.  Psychiatric/Behavioral: Positive for sleep disturbance. Negative for depressed mood. The patient is not nervous/anxious.     PMFS History:  Patient Active Problem List   Diagnosis Date Noted  . Polyarthralgia 04/20/2018  . Status post total knee replacement, bilateral 04/20/2018  . Status post total hip replacement, left 04/20/2018  . Primary osteoarthritis of both feet 04/20/2018  . Chronic pain syndrome 04/20/2018  . 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 History of anxiety 04/20/2018  . History of colonic polyps   . Flat foot 06/27/2015  . OA (osteoarthritis) of hip 03/20/2015  . Hyponatremia 10/24/2013  . OA (osteoarthritis) of knee 10/23/2013  . Hepatomegaly 09/01/2012  . Lynch syndrome 09/01/2012  . GERD (gastroesophageal reflux disease) 09/01/2012  . Knee pain 08/12/2011    Past Medical History:  Diagnosis Date  . Alcohol dependence, daily use ([place])   . Anxiety   . Arthritis    ra  . Bursitis   . GERD (gastroesophageal reflux disease)   . High cholesterol   . Lynch syndrome    has gene that causes cancer  . Neuropathy    both feet  . Tubular adenoma     Family  History  Problem Relation Age of Onset  . Colon cancer Brother        23   . Colon cancer Brother        91  . Colon cancer Mother        age 12   . Dementia Mother   . Arthritis Mother   . Heart attack Father   . Colon cancer Cousin        25 years old onset  . Colon cancer Cousin        81 years onset   Past Surgical History:  Procedure  Laterality Date  . BASAL CELL CARCINOMA EXCISION    . BUNIONECTOMY WITH HAMMERTOE RECONSTRUCTION AND GASTROC SLIDE Right 06/27/2015   Procedure: RIGHT GASTROC RECESSION/POSTERIOR TIBIAL TENOLYSIS/SECOND AND THIRD METATARSAL WEIL OSTEOTOMY AND HAMMERTOE CORRECTION;  Surgeon: Wylene Simmer, MD;  Location: [facility];  Service: Orthopedics;  Laterality: Right;  . CALCANEAL OSTEOTOMY Right 06/27/2015   Procedure: CALCANEAL OSTEOTOMY;  Surgeon: Wylene Simmer, MD;  Location: [facility];  Service: Orthopedics;  Laterality: Right;  . COLONOSCOPY   12/09/01   RMR: Normal normal rectum/ A few scattered left-sided diverticula.  The remainder of the colonic  mucosa appeared normal  . COLONOSCOPY  12/15/2002   RMR: Normal rectum, scattered left-sided diverticula.  The remainder of the colonic mucosa appeared normal  . COLONOSCOPY  04/10/2004   RMR: Normal rectum/Sigmoid diverticula/The remainder of the colonic mucosa appeared normal  . COLONOSCOPY  12/23/2005   RMR: Normal rectum, scattered sigmoid diverticula Colonic mucosa appeared normal  . COLONOSCOPY    01/20/2008   RMR: Distal diminutive rectal polyps, status post cold biopsy removal  otherwise, normal rectum/ biopsy removal; scattered sigmoid diverticula; and benign-appearing/ulcers about the ileocecal valve, status post biopsy.  Remainder of colonic mucosa and terminal mucosa appeared normal.  . COLONOSCOPY  05/22/2010   RMR: distal diminutive rectal polyp s/p bx otherwise normal/few scattered pancolonic diverticula  . COLONOSCOPY  09/16/2012   Dr.Rourk- normal rectum, few scattered pancolonic diverticulosis, one diminutive polyp in the base of the cecum. 3x3 area of hyper pigmentation i the mid descending segment, this was a flat benign-appearing area o/w the remainder of the colonic mucosa appeared normal. bx= tubular adenoma and benign colonic mucosa  . COLONOSCOPY WITH PROPOFOL N/A 09/05/2015   Procedure: COLONOSCOPY WITH  PROPOFOL;  Surgeon: Daneil Dolin, MD;  [facility];  Service: Endoscopy;  Laterality: N/A;  0830  . COLONOSCOPY WITH PROPOFOL N/A 05/24/2017   Procedure: COLONOSCOPY WITH PROPOFOL;  Surgeon: Daneil Dolin, MD;  [facility];  Service: Endoscopy;  Laterality: N/A;  . ESOPHAGOGASTRODUODENOSCOPY  12/23/2005   RMR:   Esophagogastric peptic stricture with reflux esophagitis, status post  dilation as described above/ Small hiatal hernia, otherwise normal stomach.  Bulbar erosions otherwise normal D1 and D2  . EYE SURGERY  as child  . FOOT ARTHRODESIS Right 06/17/2017   Procedure: Right Talonavicular and Subtalar Arthrodesis;  Surgeon: Wylene Simmer, MD;  Location: [facility];  Service: Orthopedics;  Laterality: Right;  . HARDWARE REMOVAL Right 06/25/2016   Procedure: REMOVAL OF DEEP IMPLANTS RIGHT MEDIAL CUNEIFORM, RIGHT CALCANEAL REMOVAL OF DEEP IMPLANTS;  Surgeon: Wylene Simmer, MD;  Location: [facility];  Service: Orthopedics;  Laterality: Right;  . JOINT REPLACEMENT  09/30/2010   Right knee  . KNEE SURGERY Right    bilat   . NOSE SURGERY     cartiledge removal  .  POLYPECTOMY  09/05/2015   Procedure: POLYPECTOMY;  Surgeon: Daneil Dolin, MD;  [facility];  Service: Endoscopy;;  . POLYPECTOMY  05/24/2017   Procedure: POLYPECTOMY;  Surgeon: Daneil Dolin, MD;  [facility];  Service: Endoscopy;;  colon  . RHINOPLASTY    . TOTAL HIP ARTHROPLASTY Left 03/20/2015   Procedure: LEFT TOTAL HIP ARTHROPLASTY ANTERIOR APPROACH;  Surgeon: Gaynelle Arabian, MD;  Location: [facility];  Service: Orthopedics;  Laterality: Left;  . TOTAL KNEE ARTHROPLASTY Left 10/23/2013   Procedure: LEFT TOTAL KNEE ARTHROPLASTY;  Surgeon: [name], MD;  Location: [facility];  Service: Orthopedics;  Laterality: Left;  . TYMPANOSTOMY TUBE PLACEMENT Right    early 2014  . TYMPANOSTOMY TUBE PLACEMENT     Social History   Social History Narrative  . Not on  file    Objective: Vital Signs: BP 120/77 (BP Location: Left Arm, Patient Position: Sitting, Cuff Size: Normal)   Pulse 81   Resp 18   Ht 6' (1.829 m)   Wt 240 lb (108.9 kg)   BMI 32.55 kg/m    Physical Exam  Constitutional: He is oriented to person, place, and time. He appears well-developed and well-nourished.  HENT:  Head: Normocephalic and atraumatic.  Eyes: Pupils are equal, round, and reactive to light. Conjunctivae and EOM are normal.  Neck: Normal range of motion. Neck supple.  Cardiovascular: Normal rate, regular rhythm and normal heart sounds.  Pulmonary/Chest: Effort normal and breath sounds normal.  Abdominal: Soft. Bowel sounds are normal.  Lymphadenopathy:    He has no cervical adenopathy.  Neurological: He is alert and oriented to person, place, and time.  Skin: Skin is warm and dry. Capillary refill takes less than 2 seconds.  Psychiatric: He has a normal mood and affect. His behavior is normal.  Nursing note and vitals reviewed.    Musculoskeletal Exam: C-spine limited range of motion.  Thoracic kyphosis and scoliosis noted.  Limited range of motion of lumbar spine.  Full range of motion bilateral shoulder joints with some discomfort.  Elbow joints good range of motion with no synovitis.  Slightly limited range of motion of wrist joints.  He has PIP and DIP synovial thickening consistent with osteoarthritis of bilateral hands.  Knee replacements have good range of motion.  He has warmth of bilateral knee replacements.   CDAI Exam: CDAI Score: Not documented Patient Global Assessment: Not documented; Provider Global Assessment: Not documented Swollen: 3 ; Tender: 3  Joint Exam      Right  Left  PIP 2     Swollen Tender  PIP 3     Swollen Tender  PIP 5  Swollen Tender        Investigation: No additional findings.  Imaging: No results found.  Recent Labs: Lab Results  Component Value Date   WBC 6.0 04/27/2018   HGB 16.2 04/27/2018   PLT 265  04/27/2018   NA 139 04/27/2018   K 4.3 04/27/2018   CL 101 04/27/2018   CO2 29 04/27/2018   GLUCOSE 110 (H) 04/27/2018   BUN 15 04/27/2018   CREATININE 1.10 04/27/2018   BILITOT 0.6 04/27/2018   ALKPHOS 87 05/14/2017   AST 27 04/27/2018   ALT 33 04/27/2018   PROT 7.6 04/27/2018   PROT 7.5 04/27/2018   ALBUMIN 5.0 (H) 05/14/2017   CALCIUM 9.9 04/27/2018   GFRAA 84 04/27/2018   QFTBGOLDPLUS NEGATIVE 04/27/2018    Speciality Comments: PLQ Eye Exam: 05/21/18 WNL @  [facility] Dr. Linna Hoff Follow up in 1 year  Procedures:  No procedures performed Allergies: Lyrica [pregabalin]    Assessment / Plan:     Visit Diagnoses: Inflammatory arthritis - U/S 04/27/18 revealed synovitis: He has evidence of inflammatory osteoarthritis on exam.  He has PIP and DIP synovial thickening.  He has tenderness and synovitis as described above.  He continues to have pain in multiple joints including both hands, wrists, shoulders, knee joints, and feet.  He had no response to PLQ after an adequate trial duration. He will discontinue PLQ, and he will start taking Celebrex 200 mg 1 tablet by mouth daily.  We also discussed natural anti-inflammatories that he can start taking.  He was given a prescription for voltaren gel that he can apply topically.  Side effects were reviewed. He was advised to notify us if he develops increased joint pain and joint swelling off of PLQ. He will follow up in the office in 3 months.    High risk medication use -Current regimen Plaquenil 200 mg twice daily started in August 2019. Last PLQ eye exam normal on 05/21/18.  Most recent CBC/CMP within normal limits on 04/27/18.  Due for CBC/CMP today to monitor for drug toxicity after initiation and then every 5 months.  Standing orders placed today. Recommend flu, Pneumovax 23, Prevnar 13, and Shingrix as indicated.He will be discontinuing PLQ due to not having any response. eye exam: 05/21/2018.   Status post left hip replacement: Good ROM with no  discomfort.   Status post bilateral knee replacements: Chronic pain.  He has mild warmth of bilateral knee joints.  No effusion noted.  He has good ROM on exam.  A prescription for diclofenac was given.  Side effects of diclofenac were discussed.  Pes planus of both feet: He wears supportive fitting shoes.   Other medical conditions are listed as follows:   History of neuropathy  History of colonic polyps  History of gastroesophageal reflux (GERD)  History of anxiety  History of hyperlipidemia  Lynch syndrome   Orders: No orders of the defined types were placed in this encounter.  Meds ordered this encounter  Medications  . diclofenac sodium (VOLTAREN) 1 % GEL    Sig: Apply 3 grams to 3 large joints up to 3 times daily    Dispense:  3 Tube    Refill:  3     Follow-Up Instructions: Return in about 3 months (around 11/05/2018) for inflammatory arthritis.  He is supposed to notify us if he develops any worsening of his symptoms.   Hazel Sams PA-C  I examined and evaluated the patient with Hazel Sams PA.  Patient has inflammatory arthritis involving mostly his PIPs.  The ultrasound showed some inflammatory changes on the MCP joints as well.  He was given a trial of Plaquenil and had no response to it so far.  I do not see justification for more aggressive DMARDs at this point.  It is possible that he may have some underlying inflammatory osteoarthritis.  Is been advised to go back on Celebrex and use some topical anti-inflammatories.  He was also given a list of natural anti-inflammatories to try.  We will see response over the next 3 months.  The plan of care was discussed as noted above.  Bo Merino, MD  Note - This record has been created using Editor, commissioning.  Chart creation errors have been sought, but may not always  have been located. Such creation errors do not reflect on  the standard of medical care. 

## 2018-08-05 ENCOUNTER — Encounter: Payer: Self-pay | Admitting: Rheumatology

## 2018-08-05 ENCOUNTER — Ambulatory Visit: Payer: BC Managed Care – PPO | Admitting: Rheumatology

## 2018-08-05 VITALS — BP 120/77 | HR 81 | Resp 18 | Ht 72.0 in | Wt 240.0 lb

## 2018-08-05 DIAGNOSIS — Z79899 Other long term (current) drug therapy: Secondary | ICD-10-CM

## 2018-08-05 DIAGNOSIS — M1990 Unspecified osteoarthritis, unspecified site: Secondary | ICD-10-CM

## 2018-08-05 DIAGNOSIS — Z96653 Presence of artificial knee joint, bilateral: Secondary | ICD-10-CM

## 2018-08-05 DIAGNOSIS — M2142 Flat foot [pes planus] (acquired), left foot: Secondary | ICD-10-CM

## 2018-08-05 DIAGNOSIS — Z96642 Presence of left artificial hip joint: Secondary | ICD-10-CM | POA: Diagnosis not present

## 2018-08-05 DIAGNOSIS — Z8639 Personal history of other endocrine, nutritional and metabolic disease: Secondary | ICD-10-CM

## 2018-08-05 DIAGNOSIS — M2141 Flat foot [pes planus] (acquired), right foot: Secondary | ICD-10-CM

## 2018-08-05 DIAGNOSIS — Z1509 Genetic susceptibility to other malignant neoplasm: Secondary | ICD-10-CM

## 2018-08-05 DIAGNOSIS — Z86010 Personal history of colonic polyps: Secondary | ICD-10-CM

## 2018-08-05 DIAGNOSIS — Z8669 Personal history of other diseases of the nervous system and sense organs: Secondary | ICD-10-CM

## 2018-08-05 DIAGNOSIS — Z8659 Personal history of other mental and behavioral disorders: Secondary | ICD-10-CM

## 2018-08-05 DIAGNOSIS — Z8719 Personal history of other diseases of the digestive system: Secondary | ICD-10-CM

## 2018-08-05 MED ORDER — DICLOFENAC SODIUM 1 % TD GEL: TRANSDERMAL | 3 refills | Status: DC

## 2018-08-09 ENCOUNTER — Telehealth: Payer: Self-pay | Admitting: *Deleted

## 2018-08-09 NOTE — Telephone Encounter (Signed)
Prior Authorization submitted via cover my meds for Voltaren Gel. Will update once response received.  

## 2018-09-12 NOTE — Telephone Encounter (Signed)
Prior Authorization has been denied.

## 2018-09-14 ENCOUNTER — Emergency Department (HOSPITAL_COMMUNITY): Payer: BC Managed Care – PPO

## 2018-09-14 ENCOUNTER — Emergency Department (HOSPITAL_COMMUNITY)
Admission: EM | Admit: 2018-09-14 | Discharge: 2018-09-14 | Disposition: A | Discharge status: Home / Self Care | Payer: BC Managed Care – PPO | Attending: Emergency Medicine | Admitting: Emergency Medicine

## 2018-09-14 ENCOUNTER — Encounter (HOSPITAL_COMMUNITY): Payer: Self-pay | Admitting: Emergency Medicine

## 2018-09-14 DIAGNOSIS — Y999 Unspecified external cause status: Secondary | ICD-10-CM | POA: Insufficient documentation

## 2018-09-14 DIAGNOSIS — S3992XA Unspecified injury of lower back, initial encounter: Secondary | ICD-10-CM | POA: Diagnosis present

## 2018-09-14 DIAGNOSIS — F10920 Alcohol use, unspecified with intoxication, uncomplicated: Secondary | ICD-10-CM

## 2018-09-14 DIAGNOSIS — Y929 Unspecified place or not applicable: Secondary | ICD-10-CM | POA: Diagnosis not present

## 2018-09-14 DIAGNOSIS — Y907 Blood alcohol level of 200-239 mg/100 ml: Secondary | ICD-10-CM | POA: Insufficient documentation

## 2018-09-14 DIAGNOSIS — Z96642 Presence of left artificial hip joint: Secondary | ICD-10-CM | POA: Insufficient documentation

## 2018-09-14 DIAGNOSIS — S3210XA Unspecified fracture of sacrum, initial encounter for closed fracture: Secondary | ICD-10-CM

## 2018-09-14 DIAGNOSIS — S065X9A Traumatic subdural hemorrhage with loss of consciousness of unspecified duration, initial encounter: Secondary | ICD-10-CM

## 2018-09-14 DIAGNOSIS — Y939 Activity, unspecified: Secondary | ICD-10-CM | POA: Diagnosis not present

## 2018-09-14 DIAGNOSIS — M549 Dorsalgia, unspecified: Secondary | ICD-10-CM

## 2018-09-14 DIAGNOSIS — S065XAA Traumatic subdural hemorrhage with loss of consciousness status unknown, initial encounter: Secondary | ICD-10-CM

## 2018-09-14 HISTORY — DX: Other specified postprocedural states: Z98.890

## 2018-09-14 HISTORY — DX: Dorsalgia, unspecified: M54.9

## 2018-09-14 LAB — PROTIME-INR
INR: 0.91
PROTHROMBIN TIME: 12.2 s (ref 11.4–15.2)

## 2018-09-14 LAB — CBC
HCT: 46.8 % (ref 39.0–52.0)
Hemoglobin: 15.6 g/dL (ref 13.0–17.0)
MCH: 30.2 pg (ref 26.0–34.0)
MCHC: 33.3 g/dL (ref 30.0–36.0)
MCV: 90.7 fL (ref 80.0–100.0)
Platelets: 256 10*3/uL (ref 150–400)
RBC: 5.16 MIL/uL (ref 4.22–5.81)
RDW: 12.5 % (ref 11.5–15.5)
WBC: 9.5 10*3/uL (ref 4.0–10.5)
nRBC: 0 % (ref 0.0–0.2)

## 2018-09-14 LAB — RAPID URINE DRUG SCREEN, HOSP PERFORMED
Amphetamines: NOT DETECTED
BARBITURATES: NOT DETECTED
Benzodiazepines: POSITIVE — AB
Cocaine: NOT DETECTED
Opiates: NOT DETECTED
Tetrahydrocannabinol: NOT DETECTED

## 2018-09-14 LAB — COMPREHENSIVE METABOLIC PANEL
ALBUMIN: 4.4 g/dL (ref 3.5–5.0)
ALT: 47 U/L — ABNORMAL HIGH (ref 0–44)
AST: 40 U/L (ref 15–41)
Alkaline Phosphatase: 72 U/L (ref 38–126)
Anion gap: 11 (ref 5–15)
BUN: 15 mg/dL (ref 8–23)
CHLORIDE: 103 mmol/L (ref 98–111)
CO2: 26 mmol/L (ref 22–32)
Calcium: 9.3 mg/dL (ref 8.9–10.3)
Creatinine, Ser: 1.03 mg/dL (ref 0.61–1.24)
GFR calc Af Amer: >60 mL/min (ref >60)
GFR calc non Af Amer: >60 mL/min (ref >60)
GLUCOSE: 102 mg/dL — AB (ref 70–99)
Potassium: 4.3 mmol/L (ref 3.5–5.1)
Sodium: 140 mmol/L (ref 135–145)
Total Bilirubin: 0.6 mg/dL (ref 0.3–1.2)
Total Protein: 7.5 g/dL (ref 6.5–8.1)

## 2018-09-14 LAB — URINALYSIS, ROUTINE W REFLEX MICROSCOPIC
Bilirubin Urine: NEGATIVE
Glucose, UA: NEGATIVE mg/dL
Ketones, ur: NEGATIVE mg/dL
Leukocytes, UA: NEGATIVE
Nitrite: NEGATIVE
PROTEIN: NEGATIVE mg/dL
Specific Gravity, Urine: 1.008 (ref 1.005–1.030)
pH: 5 (ref 5.0–8.0)

## 2018-09-14 LAB — I-STAT CHEM 8, ED
BUN: 14 mg/dL (ref 8–23)
Calcium, Ion: 0.99 mmol/L — ABNORMAL LOW (ref 1.15–1.40)
Chloride: 102 mmol/L (ref 98–111)
Creatinine, Ser: 1.3 mg/dL — ABNORMAL HIGH (ref 0.61–1.24)
Glucose, Bld: 98 mg/dL (ref 70–99)
HCT: 46 % (ref 39.0–52.0)
Hemoglobin: 15.6 g/dL (ref 13.0–17.0)
Potassium: 4.1 mmol/L (ref 3.5–5.1)
Sodium: 139 mmol/L (ref 135–145)
TCO2: 27 mmol/L (ref 22–32)

## 2018-09-14 LAB — SAMPLE TO BLOOD BANK

## 2018-09-14 LAB — ETHANOL: Alcohol, Ethyl (B): 220 mg/dL — ABNORMAL HIGH (ref <10)

## 2018-09-14 MED ORDER — HYDROCODONE-ACETAMINOPHEN 5-325 MG PO TABS: 1.0000 | ORAL_TABLET | Freq: Four times a day (QID) | ORAL | 0 refills | Status: DC | PRN

## 2018-09-14 MED ORDER — IOHEXOL 300 MG/ML  SOLN
100.0000 mL | Freq: Once | INTRAMUSCULAR | Status: AC | PRN
  Administered 2018-09-14: 100 mL via INTRAVENOUS

## 2018-09-14 MED ORDER — SODIUM CHLORIDE 0.9 % IV BOLUS
1000.0000 mL | Freq: Once | INTRAVENOUS | Status: AC
  Administered 2018-09-14: 1000 mL via INTRAVENOUS

## 2018-09-14 MED ORDER — FENTANYL CITRATE (PF) 100 MCG/2ML IJ SOLN
INTRAMUSCULAR | Status: AC
  Administered 2018-09-14: 50 ug via INTRAVENOUS
  Filled 2018-09-14: qty 2

## 2018-09-14 MED ORDER — FENTANYL CITRATE (PF) 100 MCG/2ML IJ SOLN
50.0000 ug | Freq: Once | INTRAMUSCULAR | Status: AC
  Administered 2018-09-14: 50 ug via INTRAVENOUS

## 2018-09-14 NOTE — ED Notes (Signed)
Pt stable and wheeled out for discharge, states understanding follow up.  
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
## 2018-09-14 NOTE — ED Notes (Signed)
Wife Craig [phone]
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
## 2018-09-14 NOTE — ED Notes (Signed)
Patient transported to CT 
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
## 2018-09-14 NOTE — ED Notes (Signed)
Pt ambulated in hallway to the restroom with slow and steady gait.

## 2018-09-15 ENCOUNTER — Encounter: Payer: Self-pay | Admitting: Rheumatology

## 2018-10-20 ENCOUNTER — Encounter: Payer: Self-pay | Admitting: Internal Medicine

## 2018-10-24 NOTE — Progress Notes (Signed)
Office Visit Note  Patient: Craig Scott             Date of Birth: [birth year]           MRN: [id number]             PCP: Redmond School, MD Referring: Redmond School, MD Visit Date: 11/07/2018 Occupation: @GUAROCC @  Subjective:  Pain in multiple joints   History of Present Illness: Craig Scott is a 63 y.o. male with history of inflammatory arthritis.  He is no longer taking Celebrex 200 mg 1 tablet po daily due to not receiving adequate pain relief. He previously discontinued PLQ due to not feeling like it was effective. He continues to drink 7-8 beers per day, which he reports is the only thing that provides some pain relief. He has been taking tart cherry, turmeric, and ginger which have been helpful.  He states that for several weeks he ran out of tart cherry and developed increased joint pain and has restarted.  He reports he has been having worsening pain in bilateral feet at night.  He denies any joint swelling.  He continues have chronic pain in multiple joints.  He states that he has pain in both hands but denies any joint swelling.  He has chronic pain in bilateral knee replacements.  He has trochanteric bursitis bilaterally.     Activities of Daily Living:  Patient reports morning stiffness for several hours.   Patient Reports nocturnal pain.  Difficulty dressing/grooming: Reports Difficulty climbing stairs: Reports Difficulty getting out of chair: Reports Difficulty using hands for taps, buttons, cutlery, and/or writing: Reports  Review of Systems  Constitutional: Negative for fatigue and night sweats.  HENT: Negative for mouth sores, mouth dryness and nose dryness.   Eyes: Negative for redness, itching and dryness.  Respiratory: Negative for cough, hemoptysis, shortness of breath, wheezing and difficulty breathing.   Cardiovascular: Negative for chest pain, palpitations, hypertension, irregular heartbeat and swelling in legs/feet.  Gastrointestinal: Negative  for abdominal pain, constipation and diarrhea.  Endocrine: Negative for increased urination.  Genitourinary: Negative for painful urination and urgency.  Musculoskeletal: Positive for arthralgias, joint pain and morning stiffness. Negative for joint swelling, myalgias, muscle weakness, muscle tenderness and myalgias.  Skin: Negative for color change, rash, hair loss, nodules/bumps, redness, skin tightness, ulcers and sensitivity to sunlight.  Allergic/Immunologic: Negative for susceptible to infections.  Neurological: Positive for headaches. Negative for fainting, memory loss, night sweats and weakness.  Hematological: Negative for bruising/bleeding tendency and swollen glands.  Psychiatric/Behavioral: Positive for sleep disturbance. Negative for depressed mood and confusion. The patient is not nervous/anxious.     PMFS History:  Patient Active Problem List   Diagnosis Date Noted  . Polyarthralgia 04/20/2018  . Status post total knee replacement, bilateral 04/20/2018  . Status post total hip replacement, left 04/20/2018  . Primary osteoarthritis of both feet 04/20/2018  . Chronic pain syndrome 04/20/2018  . 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 History of anxiety 04/20/2018  . History of colonic polyps   . Flat foot 06/27/2015  . OA (osteoarthritis) of hip 03/20/2015  . Hyponatremia 10/24/2013  . OA (osteoarthritis) of knee 10/23/2013  . Hepatomegaly 09/01/2012  . Lynch syndrome 09/01/2012  . GERD (gastroesophageal reflux disease) 09/01/2012  . Knee pain 08/12/2011    Past Medical History:  Diagnosis Date  . Alcohol dependence, daily use ([place])   . Anxiety   . Arthritis    ra  . Back pain with history of spinal surgery   .  Bursitis   . GERD (gastroesophageal reflux disease)   . High cholesterol   . History of hip surgery   . Lynch syndrome    has gene that causes cancer  . Neuropathy    both feet  . Tubular adenoma     Family History  Problem Relation Age of Onset  . Colon  cancer Brother        48   . Colon cancer Brother        66  . Colon cancer Mother        age 6   . Dementia Mother   . Arthritis Mother   . Heart attack Father   . Colon cancer Cousin        34 years old onset  . Colon cancer Cousin        20 years onset   Past Surgical History:  Procedure Laterality Date  . BASAL CELL CARCINOMA EXCISION    . BUNIONECTOMY WITH HAMMERTOE RECONSTRUCTION AND GASTROC SLIDE Right 06/27/2015   Procedure: RIGHT GASTROC RECESSION/POSTERIOR TIBIAL TENOLYSIS/SECOND AND THIRD METATARSAL WEIL OSTEOTOMY AND HAMMERTOE CORRECTION;  Surgeon: Wylene Simmer, MD;  Location: [place];  Service: Orthopedics;  Laterality: Right;  . CALCANEAL OSTEOTOMY Right 06/27/2015   Procedure: CALCANEAL OSTEOTOMY;  Surgeon: Wylene Simmer, MD;  Location: [place];  Service: Orthopedics;  Laterality: Right;  . COLONOSCOPY   12/09/01   RMR: Normal normal rectum/ A few scattered left-sided diverticula.  The remainder of the colonic  mucosa appeared normal  . COLONOSCOPY  12/15/2002   RMR: Normal rectum, scattered left-sided diverticula.  The remainder of the colonic mucosa appeared normal  . COLONOSCOPY  04/10/2004   RMR: Normal rectum/Sigmoid diverticula/The remainder of the colonic mucosa appeared normal  . COLONOSCOPY  12/23/2005   RMR: Normal rectum, scattered sigmoid diverticula Colonic mucosa appeared normal  . COLONOSCOPY    01/20/2008   RMR: Distal diminutive rectal polyps, status post cold biopsy removal  otherwise, normal rectum/ biopsy removal; scattered sigmoid diverticula; and benign-appearing/ulcers about the ileocecal valve, status post biopsy.  Remainder of colonic mucosa and terminal mucosa appeared normal.  . COLONOSCOPY  05/22/2010   RMR: distal diminutive rectal polyp s/p bx otherwise normal/few scattered pancolonic diverticula  . COLONOSCOPY  09/16/2012   Dr.Rourk- normal rectum, few scattered pancolonic diverticulosis, one diminutive  polyp in the base of the cecum. 3x3 area of hyper pigmentation i the mid descending segment, this was a flat benign-appearing area o/w the remainder of the colonic mucosa appeared normal. bx= tubular adenoma and benign colonic mucosa  . COLONOSCOPY WITH PROPOFOL N/A 09/05/2015   Procedure: COLONOSCOPY WITH PROPOFOL;  Surgeon: Daneil Dolin, MD;  [facility];  Service: Endoscopy;  Laterality: N/A;  0830  . COLONOSCOPY WITH PROPOFOL N/A 05/24/2017   Procedure: COLONOSCOPY WITH PROPOFOL;  Surgeon: Daneil Dolin, MD;  [facility];  Service: Endoscopy;  Laterality: N/A;  . ESOPHAGOGASTRODUODENOSCOPY  12/23/2005   RMR:   Esophagogastric peptic stricture with reflux esophagitis, status post  dilation as described above/ Small hiatal hernia, otherwise normal stomach.  Bulbar erosions otherwise normal D1 and D2  . EYE SURGERY  as child  . FOOT ARTHRODESIS Right 06/17/2017   Procedure: Right Talonavicular and Subtalar Arthrodesis;  Surgeon: Wylene Simmer, MD;  Location: [place];  Service: Orthopedics;  Laterality: Right;  . HARDWARE REMOVAL Right 06/25/2016   Procedure: REMOVAL OF DEEP IMPLANTS RIGHT MEDIAL CUNEIFORM, RIGHT CALCANEAL REMOVAL OF DEEP  IMPLANTS;  Surgeon: Wylene Simmer, MD;  Location: [place];  Service: Orthopedics;  Laterality: Right;  . JOINT REPLACEMENT  09/30/2010   Right knee  . KNEE SURGERY Right    bilat   . NOSE SURGERY     cartiledge removal  . POLYPECTOMY  09/05/2015   Procedure: POLYPECTOMY;  Surgeon: Daneil Dolin, MD;  [facility];  Service: Endoscopy;;  . POLYPECTOMY  05/24/2017   Procedure: POLYPECTOMY;  Surgeon: Daneil Dolin, MD;  [facility];  Service: Endoscopy;;  colon  . RHINOPLASTY    . TOTAL HIP ARTHROPLASTY Left 03/20/2015   Procedure: LEFT TOTAL HIP ARTHROPLASTY ANTERIOR APPROACH;  Surgeon: Gaynelle Arabian, MD;  [facility];  Service: Orthopedics;  Laterality: Left;  . TOTAL KNEE  ARTHROPLASTY Left 10/23/2013   Procedure: LEFT TOTAL KNEE ARTHROPLASTY;  Surgeon: [name], MD;  Location[facility];  Service: Orthopedics;  Laterality: Left;  . TYMPANOSTOMY TUBE PLACEMENT Right    early 2014  . TYMPANOSTOMY TUBE PLACEMENT     Social History   Social History Narrative   ** Merged History Encounter **        There is no immunization history on file for this patient.   Objective: Vital Signs: BP 136/87 (BP Location: Left Arm, Patient Position: Sitting, Cuff Size: Large)   Pulse 99   Resp 15   Ht 6' (1.829 m)   Wt 244 lb (110.7 kg)   BMI 33.09 kg/m    Physical Exam Vitals signs and nursing note reviewed.  Constitutional:      Appearance: He is well-developed.  HENT:     Head: Normocephalic and atraumatic.  Eyes:     Conjunctiva/sclera: Conjunctivae normal.     Pupils: Pupils are equal, round, and reactive to light.  Neck:     Musculoskeletal: Normal range of motion and neck supple.  Cardiovascular:     Rate and Rhythm: Normal rate and regular rhythm.     Heart sounds: Normal heart sounds.  Pulmonary:     Effort: Pulmonary effort is normal.     Breath sounds: Normal breath sounds.  Abdominal:     General: Bowel sounds are normal.     Palpations: Abdomen is soft.  Lymphadenopathy:     Cervical: No cervical adenopathy.  Skin:    General: Skin is warm and dry.     Capillary Refill: Capillary refill takes less than 2 seconds.  Neurological:     Mental Status: He is alert and oriented to person, place, and time.  Psychiatric:        Behavior: Behavior normal.      Musculoskeletal Exam: C-spine limited ROM with discomfort.  Thoracic kyphosis and scoliosis noted.  Limited and painful ROM of lumbar spine. Shoulder joints and elbow joints good ROM.  Slightly limited ROM of bilateral wrist joints. Synovitis of left 3rd PIP and right 4th and 5th PIP joints.  Synovial thickening of PIPs and DIPs.  Left hip replacement good ROM with no discomfort. Right  knee good ROM. Warmth of bilateral knee replacements.  Tenderness over trochanteric bursa bilaterally.    CDAI Exam: CDAI Score: Not documented Patient Global Assessment: Not documented; Provider Global Assessment: Not documented Swollen: 3 ; Tender: 3  Joint Exam      Right  Left  PIP 3     Swollen Tender  PIP 4  Swollen Tender     PIP 5  Swollen Tender  Investigation: No additional findings.  Imaging: No results found.  Recent Labs: Lab Results  Component Value Date   WBC 9.5 09/14/2018   HGB 15.6 09/14/2018   PLT 256 09/14/2018   NA 139 09/14/2018   K 4.1 09/14/2018   CL 102 09/14/2018   CO2 26 09/14/2018   GLUCOSE 98 09/14/2018   BUN 14 09/14/2018   CREATININE 1.30 (H) 09/14/2018   BILITOT 0.6 09/14/2018   ALKPHOS 72 09/14/2018   AST 40 09/14/2018   ALT 47 (H) 09/14/2018   PROT 7.5 09/14/2018   ALBUMIN 4.4 09/14/2018   CALCIUM 9.3 09/14/2018   GFRAA >60 09/14/2018   QFTBGOLDPLUS NEGATIVE 04/27/2018    Speciality Comments: PLQ Eye Exam: 05/21/18 WNL @ [facility] Linna Hoff Follow up in 1 year  Procedures:  No procedures performed Allergies: Lyrica [pregabalin]   Assessment / Plan:     Visit Diagnoses: Inflammatory arthritis - U/S 04/27/18 revealed synovitis: He has active synovitis on exam as described above. He has right 4th and 5th PIP and left 3rd PIP joint tenderness and synovitis.  He declined scheduling an ultrasound guided cortisone injection. He has chronic pain in multiple joints.  He has been taking tumeric, tart cherry, and ginger daily which have been helpful.  He is not on immunosuppressive treatment at this time.  He previously discontinued PLQ due to not finding it to be effective.   He tried taking Celebrex but discontinued due to inadequate pain relief.  He drinks 7-8 beers per day which he reports is the only thing that provides pain relief.  He does not want to discuss other treatment options at this time.  He was advised to notify us if he  develops increased joint pain or joint swelling.  He will follow up PRN.   High risk medication use - He did not find Celebrex 200 mg 1 tab daily to be effective and discontinued.  d/c PLQ due to no response. Last PLQ eye exam normal on 05/21/18.  He does not want want any immunosuppressive treatment at this time.  He drinks 7-8 beers per day, so MTX will not be a treatment option.    Status post left hip replacement: Doing well.  He has left trochanteric bursitis.   Status post bilateral knee replacements: Chronic pain.  He has warmth of bilateral knee replacements.   Pes planus of both feet: He has chronic pain in both feet.    History of neuropathy: He has been having worsening symptoms of neuropathy in both feet.    Other medical conditions are listed as follows:   History of colonic polyps  History of gastroesophageal reflux (GERD)  History of anxiety  History of hyperlipidemia  Lynch syndrome   Orders: No orders of the defined types were placed in this encounter.  No orders of the defined types were placed in this encounter.    Follow-Up Instructions: Return if symptoms worsen or fail to improve, for inflammatory arthritis .   Ofilia Neas, PA-C  Note - This record has been created using Dragon software.  Chart creation errors have been sought, but may not always  have been located. Such creation errors do not reflect on  the standard of medical care.

## 2018-11-07 ENCOUNTER — Telehealth: Payer: Self-pay

## 2018-11-07 ENCOUNTER — Encounter: Payer: Self-pay | Admitting: Physician Assistant

## 2018-11-07 ENCOUNTER — Ambulatory Visit: Payer: BC Managed Care – PPO | Admitting: Physician Assistant

## 2018-11-07 VITALS — BP 136/87 | HR 99 | Resp 15 | Ht 72.0 in | Wt 244.0 lb

## 2018-11-07 DIAGNOSIS — M2142 Flat foot [pes planus] (acquired), left foot: Secondary | ICD-10-CM

## 2018-11-07 DIAGNOSIS — Z8669 Personal history of other diseases of the nervous system and sense organs: Secondary | ICD-10-CM

## 2018-11-07 DIAGNOSIS — M1990 Unspecified osteoarthritis, unspecified site: Secondary | ICD-10-CM

## 2018-11-07 DIAGNOSIS — Z8659 Personal history of other mental and behavioral disorders: Secondary | ICD-10-CM

## 2018-11-07 DIAGNOSIS — Z8639 Personal history of other endocrine, nutritional and metabolic disease: Secondary | ICD-10-CM

## 2018-11-07 DIAGNOSIS — Z79899 Other long term (current) drug therapy: Secondary | ICD-10-CM | POA: Diagnosis not present

## 2018-11-07 DIAGNOSIS — M2141 Flat foot [pes planus] (acquired), right foot: Secondary | ICD-10-CM

## 2018-11-07 DIAGNOSIS — Z96642 Presence of left artificial hip joint: Secondary | ICD-10-CM | POA: Diagnosis not present

## 2018-11-07 DIAGNOSIS — Z86010 Personal history of colonic polyps: Secondary | ICD-10-CM

## 2018-11-07 DIAGNOSIS — Z96653 Presence of artificial knee joint, bilateral: Secondary | ICD-10-CM

## 2018-11-07 DIAGNOSIS — Z1509 Genetic susceptibility to other malignant neoplasm: Secondary | ICD-10-CM

## 2018-11-07 DIAGNOSIS — Z8719 Personal history of other diseases of the digestive system: Secondary | ICD-10-CM

## 2018-11-07 NOTE — Telephone Encounter (Signed)
Pt will need an office visit to schedule- his last tcs was done with propofol.  Please schedule ov.

## 2018-11-07 NOTE — Telephone Encounter (Signed)
Pt walked in office to schedule his TCS. He received a letter in the mail stating that it was time to schedule his procedure. Pt also wants to discuss the dates of his other TCS. Pt can be reached at [phone].

## 2018-11-08 ENCOUNTER — Encounter: Payer: Self-pay | Admitting: Internal Medicine

## 2018-11-08 NOTE — Telephone Encounter (Signed)
PATIENT SCHEDULED AND LETTER PRINTED, SAID HE WOULD COME IN TO PICK UP THAT INFORMATION, IT IS IN THE DRAWER AT THE FRONT DESK.  ALSO WROTE HIS LAST PROCEDURE DATES ON HIS ENVELOPE, HE NEEDED THAT INFORMATION

## 2018-12-20 ENCOUNTER — Ambulatory Visit (INDEPENDENT_AMBULATORY_CARE_PROVIDER_SITE_OTHER): Payer: BC Managed Care – PPO | Admitting: Internal Medicine

## 2018-12-20 ENCOUNTER — Encounter: Payer: Self-pay | Admitting: Internal Medicine

## 2018-12-20 ENCOUNTER — Other Ambulatory Visit: Payer: Self-pay

## 2018-12-20 DIAGNOSIS — Z1509 Genetic susceptibility to other malignant neoplasm: Secondary | ICD-10-CM

## 2018-12-20 DIAGNOSIS — K219 Gastro-esophageal reflux disease without esophagitis: Secondary | ICD-10-CM

## 2018-12-20 DIAGNOSIS — Z86010 Personal history of colonic polyps: Secondary | ICD-10-CM | POA: Diagnosis not present

## 2018-12-20 NOTE — Patient Instructions (Signed)
We will schedule a screening EGD and a surveillance colonoscopy-propofol-history of Lynch syndrome/ polyps.  History of chronic GERD.  Further recommendations to follow after the above procedures have been completed.
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
## 2021-06-16 ENCOUNTER — Other Ambulatory Visit: Payer: Self-pay

## 2021-06-16 ENCOUNTER — Ambulatory Visit (INDEPENDENT_AMBULATORY_CARE_PROVIDER_SITE_OTHER): Payer: BC Managed Care – PPO | Admitting: Urology

## 2021-06-16 ENCOUNTER — Encounter: Payer: Self-pay | Admitting: Urology

## 2021-06-16 VITALS — BP 126/72 | HR 116 | Ht 72.0 in | Wt 240.6 lb

## 2021-06-16 DIAGNOSIS — R310 Gross hematuria: Secondary | ICD-10-CM

## 2021-06-16 DIAGNOSIS — R350 Frequency of micturition: Secondary | ICD-10-CM

## 2021-06-16 DIAGNOSIS — N401 Enlarged prostate with lower urinary tract symptoms: Secondary | ICD-10-CM

## 2021-06-16 LAB — URINALYSIS, ROUTINE W REFLEX MICROSCOPIC
Bilirubin, UA: NEGATIVE
Glucose, UA: NEGATIVE
Nitrite, UA: NEGATIVE
Specific Gravity, UA: >1.03 — ABNORMAL HIGH (ref 1.005–1.030)
Urobilinogen, Ur: 0.2 mg/dL (ref 0.2–1.0)
pH, UA: 5.5 (ref 5.0–7.5)

## 2021-06-16 LAB — MICROSCOPIC EXAMINATION
Bacteria, UA: NONE SEEN
RBC, Urine: >30 /hpf — AB (ref 0–2)
Renal Epithel, UA: NONE SEEN /hpf

## 2021-06-16 LAB — BLADDER SCAN AMB NON-IMAGING: Scan Result: 49

## 2021-06-16 NOTE — Progress Notes (Signed)
Urological Symptom Review  Patient is experiencing the following symptoms: Frequent urination Hard to postpone urination Burning/pain with urination Get up at night to urinate Blood in urine Urinary tract infection   Review of Systems  Gastrointestinal (upper)  : Negative for upper GI symptoms  Gastrointestinal (lower) : Negative for lower GI symptoms  Constitutional : Negative for symptoms  Skin: Negative for skin symptoms  Eyes: Negative for eye symptoms  Ear/Nose/Throat : Negative for Ear/Nose/Throat symptoms  Hematologic/Lymphatic: Negative for Hematologic/Lymphatic symptoms  Cardiovascular : Leg swelling  Respiratory : Negative for respiratory symptoms  Endocrine: Excessive thirst  Musculoskeletal: Joint pain  Neurological: Negative for neurological symptoms  Psychologic: Negative for psychiatric symptoms

## 2021-06-16 NOTE — Progress Notes (Signed)
post void residual=49 

## 2021-06-16 NOTE — Progress Notes (Signed)
06/16/2021 11:49 AM   Craig Scott 06/01/1956 [id number]  Referring provider: Redmond School, MD [address] [place],  [place] [address]  No chief complaint on file.   HPI:  New patient-  1) BPH - patient with frequency. He has a good stream. He has not had prostate surgery and on no meds. PVR 49 ml.   2) gross hematuria - for past few weeks he has noted red urine. Microscopic hematuria-noted on UA today, 10/22. Greater than 30 red blood cells.  No bacteria. He does have Lynch syndrome. Remote smoker.   A CT scan January 2020 reported with a 9 mm left renal lesion that needed further evaluation.  Images are not available but they are having issues with power in [place] today. 08/22 abd [place] [facility] reported as normal and 11 mm left renal cyst.   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   Today, seen for the above.   PMH: Past Medical History:  Diagnosis Date   Alcohol dependence, daily use (HCC)    Anxiety    Arthritis    ra   Back pain with history of spinal surgery    Bursitis    GERD (gastroesophageal reflux disease)    High cholesterol    History of hip surgery    Lynch syndrome    has gene that causes cancer   Neuropathy    both feet   Tubular adenoma     Surgical History: Past Surgical History:  Procedure Laterality Date   BASAL CELL CARCINOMA EXCISION     BUNIONECTOMY WITH HAMMERTOE RECONSTRUCTION AND GASTROC SLIDE Right 06/27/2015   Procedure: RIGHT GASTROC RECESSION/POSTERIOR TIBIAL TENOLYSIS/SECOND AND THIRD METATARSAL WEIL OSTEOTOMY AND HAMMERTOE CORRECTION;  Surgeon: Wylene Simmer, MD;  Location: [place];  Service: Orthopedics;  Laterality: Right;   CALCANEAL OSTEOTOMY Right 06/27/2015   Procedure: CALCANEAL OSTEOTOMY;  Surgeon: Wylene Simmer, MD;  Location: [place];  Service: Orthopedics;  Laterality: Right;   COLONOSCOPY   12/09/01   RMR:  Normal normal rectum/ A few scattered left-sided diverticula.  The remainder of the colonic  mucosa appeared normal   COLONOSCOPY  12/15/2002   RMR: Normal rectum, scattered left-sided diverticula.  The remainder of the colonic mucosa appeared normal   COLONOSCOPY  04/10/2004   RMR: Normal rectum/Sigmoid diverticula/The remainder of the colonic mucosa appeared normal   COLONOSCOPY  12/23/2005   RMR: Normal rectum, scattered sigmoid diverticula Colonic mucosa appeared normal   COLONOSCOPY    01/20/2008   RMR: Distal diminutive rectal polyps, status post cold biopsy removal  otherwise, normal rectum/ biopsy removal; scattered sigmoid diverticula; and benign-appearing/ulcers about the ileocecal valve, status post biopsy.  Remainder of colonic mucosa and terminal mucosa appeared normal.   COLONOSCOPY  05/22/2010   RMR: distal diminutive rectal polyp s/p bx otherwise normal/few scattered pancolonic diverticula   COLONOSCOPY  09/16/2012   Dr.Rourk- normal rectum, few scattered pancolonic diverticulosis, one diminutive polyp in the base of the cecum. 3x3 area of hyper pigmentation i the mid descending segment, this was a flat benign-appearing area o/w the remainder of the colonic mucosa appeared normal. bx= tubular adenoma and benign colonic mucosa   COLONOSCOPY WITH PROPOFOL N/A 09/05/2015   Procedure: COLONOSCOPY WITH PROPOFOL;  Surgeon: Daneil Dolin, MD;  [facility];  Service: Endoscopy;  Laterality: N/A;  0830   COLONOSCOPY WITH PROPOFOL N/A 05/24/2017   Procedure:  COLONOSCOPY WITH PROPOFOL;  Surgeon: Daneil Dolin, MD;  [facility];  Service: Endoscopy;  Laterality: N/A;   ESOPHAGOGASTRODUODENOSCOPY  12/23/2005   RMR:   Esophagogastric peptic stricture with reflux esophagitis, status post  dilation as described above/ Small hiatal hernia, otherwise normal stomach.  Bulbar erosions otherwise normal D1 and D2   EYE SURGERY  as child   FOOT ARTHRODESIS Right 06/17/2017    Procedure: Right Talonavicular and Subtalar Arthrodesis;  Surgeon: Wylene Simmer, MD;  Location: [place];  Service: Orthopedics;  Laterality: Right;   HARDWARE REMOVAL Right 06/25/2016   Procedure: REMOVAL OF DEEP IMPLANTS RIGHT MEDIAL CUNEIFORM, RIGHT CALCANEAL REMOVAL OF DEEP IMPLANTS;  Surgeon: Wylene Simmer, MD;  Location: [place];  Service: Orthopedics;  Laterality: Right;   JOINT REPLACEMENT  09/30/2010   Right knee   KNEE SURGERY Right    bilat    NOSE SURGERY     cartiledge removal   POLYPECTOMY  09/05/2015   Procedure: POLYPECTOMY;  Surgeon: Daneil Dolin, MD;  [facility];  Service: Endoscopy;;   POLYPECTOMY  05/24/2017   Procedure: POLYPECTOMY;  Surgeon: Daneil Dolin, MD;  [facility];  Service: Endoscopy;;  colon   RHINOPLASTY     TOTAL HIP ARTHROPLASTY Left 03/20/2015   Procedure: LEFT TOTAL HIP ARTHROPLASTY ANTERIOR APPROACH;  Surgeon: Gaynelle Arabian, MD;  Location: [facility];  Service: Orthopedics;  Laterality: Left;   TOTAL KNEE ARTHROPLASTY Left 10/23/2013   Procedure: LEFT TOTAL KNEE ARTHROPLASTY;  Surgeon: [name], MD;  Location: [facility];  Service: Orthopedics;  Laterality: Left;   TYMPANOSTOMY TUBE PLACEMENT Right    early 2014   TYMPANOSTOMY TUBE PLACEMENT      Home Medications:  Allergies as of 06/16/2021       Reactions   Lyrica [pregabalin] Other (See Comments)   "changes personality"        Medication List        Accurate as of June 16, 2021 11:49 AM. If you have any questions, ask your nurse or doctor.          ALPRAZolam 1 MG tablet Commonly known as: XANAX Take 1 mg by mouth 3 (three) times daily as needed for anxiety. anxiety   aspirin EC 81 MG tablet Take 1 tablet (81 mg total) by mouth 2 (two) times daily. What changed: when to take this   CELEBREX PO Take by mouth daily. Unsure of dosage   GINGER PO Take by mouth daily.   Glucosamine Chond MSM Formula Tabs Take 1 tablet  by mouth daily.   TART CHERRY ADVANCED PO Take by mouth daily.   Humira Pen 40 MG/0.4ML Pnkt Generic drug: Adalimumab SMARTSIG:40 Milligram(s) SUB-Q Every 2 Weeks   melatonin 3 MG Tabs tablet Take 3 mg by mouth at bedtime.   multivitamin with minerals Tabs tablet Take 1 tablet by mouth daily.   Potassium 99 MG Tabs Take 99 mg by mouth daily.   simvastatin 20 MG tablet Commonly known as: ZOCOR Take 20 mg by mouth daily.   TURMERIC PO Take by mouth daily.        Allergies:  Allergies  Allergen Reactions   Lyrica [Pregabalin] Other (See Comments)    "changes personality"    Family History: Family History  Problem Relation Age of Onset   Colon cancer Brother        68    Colon cancer Brother  57   Colon cancer Mother        age 69    Dementia Mother    Arthritis Mother    Heart attack Father    Colon cancer Cousin        34 years old onset   Colon cancer Cousin        79 years onset    Social History:  reports that he quit smoking about 15 years ago. His smoking use included cigars and cigarettes. He has a 15.00 pack-year smoking history. He has never used smokeless tobacco. He reports current alcohol use. He reports that he does not currently use drugs after having used the following drugs: Marijuana.   Physical Exam: BP 126/72   Pulse (!) 116   Ht 6' (1.829 m)   Wt 240 lb 9.6 oz (109.1 kg)   BMI 32.63 kg/m   Constitutional:  Alert and oriented, No acute distress. HEENT: [place] AT, moist mucus membranes.  Trachea midline, no masses. Cardiovascular: No clubbing, cyanosis, or edema. Respiratory: Normal respiratory effort, no increased work of breathing. GI: Abdomen is soft, nontender, nondistended, no abdominal masses GU: No CVA tenderness Skin: No rashes, bruises or suspicious lesions. Neurologic: Grossly intact, no focal deficits, moving all 4 extremities. Psychiatric: Normal mood and affect. DRE: prostate 30 g - smooth without hard area or  nodule   Laboratory Data: Lab Results  Component Value Date   WBC 9.5 09/14/2018   HGB 15.6 09/14/2018   HCT 46.0 09/14/2018   MCV 90.7 09/14/2018   PLT 256 09/14/2018    Lab Results  Component Value Date   CREATININE 1.30 (H) 09/14/2018    No results found for: PSA  No results found for: TESTOSTERONE  No results found for: HGBA1C  Urinalysis    Component Value Date/Time   COLORURINE STRAW (A) 09/14/2018 1900   APPEARANCEUR CLEAR 09/14/2018 1900   LABSPEC 1.008 09/14/2018 1900   PHURINE 5.0 09/14/2018 1900   GLUCOSEU NEGATIVE 09/14/2018 1900   HGBUR MODERATE (A) 09/14/2018 1900   BILIRUBINUR NEGATIVE 09/14/2018 1900   KETONESUR NEGATIVE 09/14/2018 1900   PROTEINUR NEGATIVE 09/14/2018 1900   UROBILINOGEN 1.0 03/15/2015 1508   NITRITE NEGATIVE 09/14/2018 1900   LEUKOCYTESUR NEGATIVE 09/14/2018 1900    Lab Results  Component Value Date   BACTERIA RARE (A) 09/14/2018    Pertinent Imaging: CT report 01/20 No results found for this or any previous visit.  No results found for this or any previous visit.  No results found for this or any previous visit.  No results found for this or any previous visit.  No results found for this or any previous visit.  No results found for this or any previous visit.  No results found for this or any previous visit.  No results found for this or any previous visit.   Assessment & Plan:    1. Urine frequency Needs hematuria eval . - Urinalysis, Routine w reflex microscopic - BLADDER SCAN AMB NON-IMAGING  2. Gross hematuria - disc hematuria causes - benign vs malignant. Disc CT scan vs using the abd [place] from 08/22. He wants to proceed with CT and will f/u for cystoscopy/     No follow-ups on file.  Festus Aloe, MD  [facility]  [address] [place], [place] [address] [phone]

## 2021-06-17 LAB — PSA: Prostate Specific Ag, Serum: 0.5 ng/mL (ref 0.0–4.0)

## 2021-06-30 ENCOUNTER — Other Ambulatory Visit: Payer: BC Managed Care – PPO | Admitting: Urology

## 2021-07-09 ENCOUNTER — Ambulatory Visit (HOSPITAL_COMMUNITY)
Admission: RE | Admit: 2021-07-09 | Discharge: 2021-07-09 | Disposition: A | Discharge status: Home / Self Care | Payer: BC Managed Care – PPO | Source: Ambulatory Visit | Attending: Urology | Admitting: Urology

## 2021-07-09 ENCOUNTER — Other Ambulatory Visit: Payer: Self-pay

## 2021-07-09 DIAGNOSIS — R310 Gross hematuria: Secondary | ICD-10-CM

## 2021-07-09 LAB — POCT I-STAT CREATININE: Creatinine, Ser: 0.9 mg/dL (ref 0.61–1.24)

## 2021-07-09 MED ORDER — IOHEXOL 300 MG/ML  SOLN
125.0000 mL | Freq: Once | INTRAMUSCULAR | Status: AC | PRN
  Administered 2021-07-09: 100 mL via INTRAVENOUS

## 2021-07-21 ENCOUNTER — Other Ambulatory Visit: Payer: Self-pay

## 2021-07-21 ENCOUNTER — Encounter: Payer: Self-pay | Admitting: Urology

## 2021-07-21 ENCOUNTER — Ambulatory Visit (INDEPENDENT_AMBULATORY_CARE_PROVIDER_SITE_OTHER): Payer: BC Managed Care – PPO | Admitting: Urology

## 2021-07-21 VITALS — BP 155/78 | HR 83 | Temp 98.5°F

## 2021-07-21 DIAGNOSIS — R350 Frequency of micturition: Secondary | ICD-10-CM | POA: Diagnosis not present

## 2021-07-21 DIAGNOSIS — R310 Gross hematuria: Secondary | ICD-10-CM | POA: Diagnosis not present

## 2021-07-21 LAB — URINALYSIS, ROUTINE W REFLEX MICROSCOPIC
Bilirubin, UA: NEGATIVE
Glucose, UA: NEGATIVE
Ketones, UA: NEGATIVE
Nitrite, UA: NEGATIVE
Protein,UA: NEGATIVE
RBC, UA: NEGATIVE
Specific Gravity, UA: 1.025 (ref 1.005–1.030)
Urobilinogen, Ur: 0.2 mg/dL (ref 0.2–1.0)
pH, UA: 5.5 (ref 5.0–7.5)

## 2021-07-21 LAB — MICROSCOPIC EXAMINATION
Bacteria, UA: NONE SEEN
RBC, Urine: NONE SEEN /hpf (ref 0–2)
Renal Epithel, UA: NONE SEEN /hpf

## 2021-07-21 MED ORDER — CIPROFLOXACIN HCL 500 MG PO TABS
500.0000 mg | ORAL_TABLET | Freq: Once | ORAL | Status: AC
  Administered 2021-07-21: 500 mg via ORAL

## 2021-07-21 NOTE — Progress Notes (Signed)
Urological Symptom Review  Patient is experiencing the following symptoms: Frequent urination Hard to postpone urination Burning/pain with urination Get up at night to urinate Stream starts and stops   Review of Systems  Gastrointestinal (upper)  : Negative for upper GI symptoms  Gastrointestinal (lower) : Negative for lower GI symptoms  Constitutional : Negative for symptoms  Skin: Negative for skin symptoms  Eyes: Negative for eye symptoms  Ear/Nose/Throat : Negative for Ear/Nose/Throat symptoms  Hematologic/Lymphatic: Negative for Hematologic/Lymphatic symptoms  Cardiovascular : Negative for cardiovascular symptoms  Respiratory : Negative for respiratory symptoms  Endocrine: Negative for endocrine symptoms  Musculoskeletal: Negative for musculoskeletal symptoms  Neurological: Negative for neurological symptoms  Psychologic: Negative for psychiatric symptoms

## 2021-07-21 NOTE — Progress Notes (Signed)
   07/21/21  CC: No chief complaint on file.   HPI: F/u -    1) BPH - patient with frequency. He has a good stream. He has not had prostate surgery and on no meds. PVR 49 ml.    2) gross hematuria - for past few weeks he has noted red urine. Microscopic hematuria-noted on UA Oct 2022 - greater than 30 red blood cells.  No bacteria. He does have Lynch syndrome. Remote smoker.   Imaging: Jan 2020 CT scan a/p reported with a 9 mm left renal lesion that needed further evaluation.  Aug 2022 - abd [place] [facility] reported as normal and 11 mm left renal cyst.   Oct 2022 CT a/p - benign. 9 mm left renal lesion/cyst stable.    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    Today, seen for the above to review imaging and for cystoscopy.  He complains of urinary frequency.  He does drink "a lot of beer and water".  He has had no further gross hematuria.  There were no vitals taken for this visit. NED. A&Ox3.   No respiratory distress   Abd soft, NT, ND Normal phallus with bilateral descended testicles  Cystoscopy Procedure Note  Patient identification was confirmed, informed consent was obtained, and patient was prepped using Betadine solution.  Lidocaine jelly was administered per urethral meatus.     Pre-Procedure: - Inspection reveals a normal caliber ureteral meatus.  Procedure: The flexible cystoscope was introduced without difficulty - No urethral strictures/lesions are present. - prostate borderline obstructing, mild lateral lobe hypertrophy - bladder neck - Bilateral ureteral orifices identified - Bladder mucosa  reveals no ulcers or papillary tumors.  In the dome and the left superior of the bladder there is 2 small conspicuous areas of erythema.  - No bladder stones - No trabeculation  Retroflexion shows normal bladder neck.   Post-Procedure: - Patient tolerated the procedure well  Assessment/  Plan:  Bladder erythema, gross hematuria-his hematuria has resolved.  His UA is clear today.  There is some conspicuous areas of erythema at the dome.  These need a biopsy to rule out CIS.  We discussed benign and malignant causes of bladder erythema. These areas could be related to inflammation/infection and could resolve.  We went over the nature risk benefits and alternatives to cystoscopy with bladder biopsy and fulguration.  All questions answered.  He elects to proceed.  I also sent urine for cytology.  UA today clear with no bacteria.  Urinary frequency-as above, need to rule out any malignant cause of bladder irritation.  No follow-ups on file.  Festus Aloe, MD

## 2021-07-21 NOTE — Addendum Note (Signed)
Addended by: Dorisann Frames on: 07/21/2021 04:33 PM   Modules accepted: Orders

## 2021-07-22 ENCOUNTER — Telehealth: Payer: Self-pay

## 2021-07-22 LAB — CYTOLOGY, URINE

## 2021-07-22 NOTE — Telephone Encounter (Signed)
Surgery posting sheet faxed to [facility] to [facility]

## 2021-08-11 ENCOUNTER — Telehealth: Payer: Self-pay | Admitting: Internal Medicine

## 2021-08-11 ENCOUNTER — Telehealth: Payer: Self-pay

## 2021-08-11 NOTE — Telephone Encounter (Signed)
Patient needing to find out about upcoming procedure to be done at [facility]. Still having issues.  Please advise asap at [phone].  Thanks, Helene Kelp

## 2021-08-11 NOTE — Telephone Encounter (Signed)
Called and said that he spoke to Dr. Gala Romney and he told him that he may be able to do his colonoscopy before the beginning of the year.  Does he even need an office visit?

## 2021-08-11 NOTE — Telephone Encounter (Signed)
Pt would technically need ov since last TCS with Propofol (done by Dr. Gala Romney).  However, based on his conversation with Dr. Gala Romney, I will send him a message.  Dr. Gala Romney:  Pt had last TCS with you with Propofol.  Do you want me to skip ov and just triage?

## 2021-08-11 NOTE — Telephone Encounter (Signed)
Returned patient call. Patient elects to proceed with surgery  and calling to ask about date. Coni  with [facility] schedules surgery. Number provided to patient to cal to speak with Coni. Voiced understanding.

## 2021-08-12 ENCOUNTER — Ambulatory Visit (INDEPENDENT_AMBULATORY_CARE_PROVIDER_SITE_OTHER): Payer: Self-pay | Admitting: *Deleted

## 2021-08-12 ENCOUNTER — Encounter: Payer: Self-pay | Admitting: *Deleted

## 2021-08-12 VITALS — Ht 71.0 in | Wt 240.0 lb

## 2021-08-12 DIAGNOSIS — Z1509 Genetic susceptibility to other malignant neoplasm: Secondary | ICD-10-CM

## 2021-08-12 DIAGNOSIS — Z86010 Personal history of colonic polyps: Secondary | ICD-10-CM

## 2021-08-12 NOTE — Telephone Encounter (Signed)
Scheduled nurse visit for 08/12/2021 at 11:00 by telephone.

## 2021-08-12 NOTE — Progress Notes (Addendum)
Gastroenterology Pre-Procedure Review  Request Date: 08/12/2021 Requesting Physician: 18 month recall (overdue), Last TCS done 05/24/2017 done by Dr. Gala Romney, tubular adenoma, triage ASAP per Dr. Gala Romney  PATIENT REVIEW QUESTIONS: The patient responded to the following health history questions as indicated:    1. Diabetes Melitis: no 2. Joint replacements in the past 12 months: no 3. Major health problems in the past 3 months: no 4. Has an artificial valve or MVP: no 5. Has a defibrillator: no 6. Has been advised in past to take antibiotics in advance of a procedure like teeth cleaning: yes, former hip replacement, 2 total knee replacements, and rods and screws in foot 7. Family history of colon cancer: yes, mother: age unknown, 2 brothers: ages unknown 32. Alcohol Use: yes, 10 to 12 drinks a day 9. Illicit drug Use: no 10. History of sleep apnea: no 11. History of coronary artery or other vascular stents placed within the last 12 months: no 12. History of any prior anesthesia complications: yes, pt says he is hard to sedate 13. Body mass index is 33.47 kg/m.    MEDICATIONS & ALLERGIES:    Patient reports the following regarding taking any blood thinners:   Plavix? no Aspirin? Yes, 81 mg Coumadin? no Brilinta? no Xarelto? no Eliquis? no Pradaxa? no Savaysa? no Effient? no  Patient confirms/reports the following medications:  Current Outpatient Medications  Medication Sig Dispense Refill   ALPRAZolam (XANAX) 1 MG tablet Take 1 mg by mouth 3 (three) times daily as needed for anxiety. anxiety     aspirin EC 81 MG tablet Take 1 tablet (81 mg total) by mouth 2 (two) times daily. (Patient taking differently: Take 81 mg by mouth daily.) 84 tablet 0   Celecoxib (CELEBREX PO) Take by mouth daily. Unsure of dosage     cetirizine (ZYRTEC) 10 MG tablet Take 10 mg by mouth daily.     HUMIRA PEN 40 MG/0.4ML PNKT SMARTSIG:40 Milligram(s) SUB-Q Every 2 Weeks     hydroxychloroquine (PLAQUENIL) 200  MG tablet Take 200 mg by mouth 2 (two) times daily.     Melatonin 3 MG TABS Take 3 mg by mouth at bedtime.     Misc Natural Products (GLUCOSAMINE CHOND MSM FORMULA) TABS Take 1 tablet by mouth daily.     Multiple Vitamin (MULTIVITAMIN WITH MINERALS) TABS tablet Take 1 tablet by mouth daily.     Niacin (VITAMIN B-3 PO) Take by mouth daily at 6 (six) AM.     Potassium 99 MG TABS Take 99 mg by mouth daily.     sildenafil (VIAGRA) 25 MG tablet Take 25 mg by mouth daily as needed.     simvastatin (ZOCOR) 20 MG tablet Take 20 mg by mouth daily.      zinc gluconate 50 MG tablet Take by mouth daily.     No current facility-administered medications for this visit.    Patient confirms/reports the following allergies:  Allergies  Allergen Reactions   Lyrica [Pregabalin] Other (See Comments)    "changes personality"    No orders of the defined types were placed in this encounter.   AUTHORIZATION INFORMATION Primary Insurance: [employer],  [place] #: [id number],  Group #: [id number] Pre-Cert / Josem Kaufmann required: No, not required  SCHEDULE INFORMATION: Procedure has been scheduled as follows:  Date: 09/04/2021, Time: 1:15 Location: [facility] with Dr. Gala Romney  This Gastroenterology Pre-Precedure Review Form is being routed to the following provider(s): Aliene Altes, PA-C

## 2021-08-12 NOTE — Progress Notes (Signed)
Reviewed telephone note 11/28. Per RMR, ok to triage, needs EGD at same time as TCS; alert endo will need an ERCP scope as part of the EGD.  Angie, ok to schedule with RMR with propofol.  ASA III

## 2021-08-13 ENCOUNTER — Encounter: Payer: Self-pay | Admitting: *Deleted

## 2021-08-13 MED ORDER — CLENPIQ 10-3.5-12 MG-GM -GM/160ML PO SOLN: 1.0000 | Freq: Once | ORAL | 0 refills | Status: AC

## 2021-08-13 NOTE — Progress Notes (Signed)
Called Day Surgery and spoke to [place].  She was informed that Dr. Gala Romney requested ERCP scope as part of EGD.  She said that they would have to use the disposable one.  She informed me that she would relay information to [place].    Spoke to pt.  He was informed of Pre-op visit on 09/02/2021 at 9:00.  Reviewed all prep instructions with pt.  He is aware that I am mailing them out to him as well.  Confirmed mailing address.

## 2021-08-13 NOTE — Addendum Note (Signed)
Addended by: Metro Kung on: 08/13/2021 08:59 AM   Modules accepted: Orders

## 2021-08-20 NOTE — Telephone Encounter (Signed)
Patient came by office today to inquire about upcoming surgery date. Patient is to have surgery with Dr. Junious Silk-.  I called Coni with [facility] scheduling and she stated she has been attempting to reach patient without success. Patient informed to reach out to [facility] at [phone] ext [phone]. Patient voiced understanding.

## 2021-08-21 ENCOUNTER — Other Ambulatory Visit: Payer: Self-pay | Admitting: Urology

## 2021-09-01 NOTE — Patient Instructions (Signed)
Craig Scott  09/01/2021     @PREFPERIOPPHARMACY @   Your procedure is scheduled on  09/04/2021.   Report to [facility] at  1215  P.M.   Call this number if you have problems the morning of surgery:  [phone]   Remember:  Follow the diet and prep instructions given to you by the office.    Take these medicines the morning of surgery with A SIP OF WATER                                              xanax(if needed), zyrtec.      Do not wear jewelry, make-up or nail polish.  Do not wear lotions, powders, or perfumes, or deodorant.  Do not shave 48 hours prior to surgery.  Men may shave face and neck.  Do not bring valuables to the hospital.  [facility] is not responsible for any belongings or valuables.  Contacts, dentures or bridgework may not be worn into surgery.  Leave your suitcase in the car.  After surgery it may be brought to your room.  For patients admitted to the hospital, discharge time will be determined by your treatment team.  Patients discharged the day of surgery will not be allowed to drive home and must have someone with them for 24 hours.    Special instructions:   DO NOT smoke tobacco or vape for 24 hors before your procedure.  Please read over the following fact sheets that you were given. Anesthesia Post-op Instructions and Care and Recovery After Surgery      Colonoscopy, Adult, Care After This sheet gives you information about how to care for yourself after your procedure. Your health care provider may also give you more specific instructions. If you have problems or questions, contact your health care provider. What can I expect after the procedure? After the procedure, it is common to have: A small amount of blood in your stool for 24 hours after the procedure. Some gas. Mild cramping or bloating of your abdomen. Follow these instructions at home: Eating and drinking  Drink enough fluid to keep your urine pale  yellow. Follow instructions from your health care provider about eating or drinking restrictions. Resume your normal diet as instructed by your health care provider. Avoid heavy or fried foods that are hard to digest. Activity Rest as told by your health care provider. Avoid sitting for a long time without moving. Get up to take short walks every 1-2 hours. This is important to improve blood flow and breathing. Ask for help if you feel weak or unsteady. Return to your normal activities as told by your health care provider. Ask your health care provider what activities are safe for you. Managing cramping and bloating  Try walking around when you have cramps or feel bloated. Apply heat to your abdomen as told by your health care provider. Use the heat source that your health care provider recommends, such as a moist heat pack or a heating pad. Place a towel between your skin and the heat source. Leave the heat on for 20-30 minutes. Remove the heat if your skin turns bright red. This is especially important if you are unable to feel pain, heat, or cold. You may have a greater risk of getting burned. General instructions If you were given  a sedative during the procedure, it can affect you for several hours. Do not drive or operate machinery until your health care provider says that it is safe. For the first 24 hours after the procedure: Do not sign important documents. Do not drink alcohol. Do your regular daily activities at a slower pace than normal. Eat soft foods that are easy to digest. Take over-the-counter and prescription medicines only as told by your health care provider. Keep all follow-up visits as told by your health care provider. This is important. Contact a health care provider if: You have blood in your stool 2-3 days after the procedure. Get help right away if you have: More than a small spotting of blood in your stool. Large blood clots in your stool. Swelling of your  abdomen. Nausea or vomiting. A fever. Increasing pain in your abdomen that is not relieved with medicine. Summary After the procedure, it is common to have a small amount of blood in your stool. You may also have mild cramping and bloating of your abdomen. If you were given a sedative during the procedure, it can affect you for several hours. Do not drive or operate machinery until your health care provider says that it is safe. Get help right away if you have a lot of blood in your stool, nausea or vomiting, a fever, or increased pain in your abdomen. This information is not intended to replace advice given to you by your health care provider. Make sure you discuss any questions you have with your health care provider. Document Revised: 07/07/2019 Document Reviewed: 03/27/2019 Elsevier Patient Education  [place] After This sheet gives you information about how to care for yourself after your procedure. Your health care provider may also give you more specific instructions. If you have problems or questions, contact your health care provider. What can I expect after the procedure? After the procedure, it is common to have: Tiredness. Forgetfulness about what happened after the procedure. Impaired judgment for important decisions. Nausea or vomiting. Some difficulty with balance. Follow these instructions at home: For the time period you were told by your health care provider:   Rest as needed. Do not participate in activities where you could fall or become injured. Do not drive or use machinery. Do not drink alcohol. Do not take sleeping pills or medicines that cause drowsiness. Do not make important decisions or sign legal documents. Do not take care of children on your own. Eating and drinking Follow the diet that is recommended by your health care provider. Drink enough fluid to keep your urine pale yellow. If you vomit: Drink water,  juice, or soup when you can drink without vomiting. Make sure you have little or no nausea before eating solid foods. General instructions Have a responsible adult stay with you for the time you are told. It is important to have someone help care for you until you are awake and alert. Take over-the-counter and prescription medicines only as told by your health care provider. If you have sleep apnea, surgery and certain medicines can increase your risk for breathing problems. Follow instructions from your health care provider about wearing your sleep device: Anytime you are sleeping, including during daytime naps. While taking prescription pain medicines, sleeping medicines, or medicines that make you drowsy. Avoid smoking. Keep all follow-up visits as told by your health care provider. This is important. Contact a health care provider if: You keep feeling nauseous or you keep vomiting.  You feel light-headed. You are still sleepy or having trouble with balance after 24 hours. You develop a rash. You have a fever. You have redness or swelling around the IV site. Get help right away if: You have trouble breathing. You have new-onset confusion at home. Summary For several hours after your procedure, you may feel tired. You may also be forgetful and have poor judgment. Have a responsible adult stay with you for the time you are told. It is important to have someone help care for you until you are awake and alert. Rest as told. Do not drive or operate machinery. Do not drink alcohol or take sleeping pills. Get help right away if you have trouble breathing, or if you suddenly become confused. This information is not intended to replace advice given to you by your health care provider. Make sure you discuss any questions you have with your health care provider. Document Revised: 05/16/2020 Document Reviewed: 08/03/2019 Elsevier Patient Education  2022 [employer].

## 2021-09-02 ENCOUNTER — Encounter (HOSPITAL_COMMUNITY)
Admission: RE | Admit: 2021-09-02 | Discharge: 2021-09-02 | Disposition: A | Discharge status: Home / Self Care | Payer: BC Managed Care – PPO | Source: Ambulatory Visit | Attending: Internal Medicine | Admitting: Internal Medicine

## 2021-09-02 ENCOUNTER — Other Ambulatory Visit: Payer: Self-pay

## 2021-09-02 ENCOUNTER — Encounter (HOSPITAL_COMMUNITY): Payer: Self-pay

## 2021-09-02 VITALS — BP 138/91 | HR 74 | Temp 98.6°F | Resp 18 | Ht 71.0 in | Wt 240.0 lb

## 2021-09-02 DIAGNOSIS — K269 Duodenal ulcer, unspecified as acute or chronic, without hemorrhage or perforation: Secondary | ICD-10-CM | POA: Diagnosis not present

## 2021-09-02 DIAGNOSIS — K449 Diaphragmatic hernia without obstruction or gangrene: Secondary | ICD-10-CM | POA: Diagnosis not present

## 2021-09-02 DIAGNOSIS — Z86010 Personal history of colonic polyps: Secondary | ICD-10-CM | POA: Diagnosis not present

## 2021-09-02 DIAGNOSIS — K219 Gastro-esophageal reflux disease without esophagitis: Secondary | ICD-10-CM | POA: Diagnosis not present

## 2021-09-02 DIAGNOSIS — Z800 Family history of malignant neoplasm of digestive organs: Secondary | ICD-10-CM | POA: Diagnosis not present

## 2021-09-02 DIAGNOSIS — F1010 Alcohol abuse, uncomplicated: Secondary | ICD-10-CM

## 2021-09-02 DIAGNOSIS — Z1211 Encounter for screening for malignant neoplasm of colon: Secondary | ICD-10-CM | POA: Diagnosis present

## 2021-09-02 DIAGNOSIS — D122 Benign neoplasm of ascending colon: Secondary | ICD-10-CM | POA: Diagnosis not present

## 2021-09-02 DIAGNOSIS — Z01812 Encounter for preprocedural laboratory examination: Secondary | ICD-10-CM | POA: Insufficient documentation

## 2021-09-02 DIAGNOSIS — K621 Rectal polyp: Secondary | ICD-10-CM | POA: Diagnosis not present

## 2021-09-02 DIAGNOSIS — D127 Benign neoplasm of rectosigmoid junction: Secondary | ICD-10-CM | POA: Diagnosis not present

## 2021-09-02 DIAGNOSIS — Z87891 Personal history of nicotine dependence: Secondary | ICD-10-CM | POA: Diagnosis not present

## 2021-09-02 DIAGNOSIS — Z1509 Genetic susceptibility to other malignant neoplasm: Secondary | ICD-10-CM | POA: Diagnosis not present

## 2021-09-02 LAB — COMPREHENSIVE METABOLIC PANEL
ALT: 39 U/L (ref 0–44)
AST: 31 U/L (ref 15–41)
Albumin: 4.5 g/dL (ref 3.5–5.0)
Alkaline Phosphatase: 69 U/L (ref 38–126)
Anion gap: 7 (ref 5–15)
BUN: 17 mg/dL (ref 8–23)
CO2: 28 mmol/L (ref 22–32)
Calcium: 9.2 mg/dL (ref 8.9–10.3)
Chloride: 101 mmol/L (ref 98–111)
Creatinine, Ser: 0.7 mg/dL (ref 0.61–1.24)
GFR, Estimated: >60 mL/min (ref >60)
Glucose, Bld: 121 mg/dL — ABNORMAL HIGH (ref 70–99)
Potassium: 4 mmol/L (ref 3.5–5.1)
Sodium: 136 mmol/L (ref 135–145)
Total Bilirubin: 0.6 mg/dL (ref 0.3–1.2)
Total Protein: 7.6 g/dL (ref 6.5–8.1)

## 2021-09-04 ENCOUNTER — Ambulatory Visit (HOSPITAL_COMMUNITY): Payer: BC Managed Care – PPO | Admitting: Anesthesiology

## 2021-09-04 ENCOUNTER — Encounter (HOSPITAL_COMMUNITY): Payer: Self-pay

## 2021-09-04 ENCOUNTER — Ambulatory Visit (HOSPITAL_COMMUNITY): Admit: 2021-09-04 | Payer: BC Managed Care – PPO | Admitting: Internal Medicine

## 2021-09-04 ENCOUNTER — Ambulatory Visit (HOSPITAL_COMMUNITY)
Admission: RE | Admit: 2021-09-04 | Discharge: 2021-09-04 | Disposition: A | Discharge status: Home / Self Care | Payer: BC Managed Care – PPO | Attending: Internal Medicine | Admitting: Internal Medicine

## 2021-09-04 ENCOUNTER — Encounter (HOSPITAL_COMMUNITY)
Admission: RE | Disposition: A | Discharge status: Home / Self Care | Payer: Self-pay | Source: Home / Self Care | Attending: Internal Medicine

## 2021-09-04 DIAGNOSIS — D128 Benign neoplasm of rectum: Secondary | ICD-10-CM | POA: Diagnosis not present

## 2021-09-04 DIAGNOSIS — K219 Gastro-esophageal reflux disease without esophagitis: Secondary | ICD-10-CM | POA: Insufficient documentation

## 2021-09-04 DIAGNOSIS — Z87891 Personal history of nicotine dependence: Secondary | ICD-10-CM | POA: Insufficient documentation

## 2021-09-04 DIAGNOSIS — Z1509 Genetic susceptibility to other malignant neoplasm: Secondary | ICD-10-CM | POA: Insufficient documentation

## 2021-09-04 DIAGNOSIS — Z1211 Encounter for screening for malignant neoplasm of colon: Secondary | ICD-10-CM | POA: Insufficient documentation

## 2021-09-04 DIAGNOSIS — Z800 Family history of malignant neoplasm of digestive organs: Secondary | ICD-10-CM | POA: Insufficient documentation

## 2021-09-04 DIAGNOSIS — D127 Benign neoplasm of rectosigmoid junction: Secondary | ICD-10-CM | POA: Insufficient documentation

## 2021-09-04 DIAGNOSIS — Z86010 Personal history of colonic polyps: Secondary | ICD-10-CM | POA: Insufficient documentation

## 2021-09-04 DIAGNOSIS — K449 Diaphragmatic hernia without obstruction or gangrene: Secondary | ICD-10-CM | POA: Insufficient documentation

## 2021-09-04 DIAGNOSIS — D122 Benign neoplasm of ascending colon: Secondary | ICD-10-CM | POA: Insufficient documentation

## 2021-09-04 DIAGNOSIS — K621 Rectal polyp: Secondary | ICD-10-CM | POA: Insufficient documentation

## 2021-09-04 DIAGNOSIS — K269 Duodenal ulcer, unspecified as acute or chronic, without hemorrhage or perforation: Secondary | ICD-10-CM | POA: Insufficient documentation

## 2021-09-04 HISTORY — PX: OTHER SURGICAL HISTORY: SHX169

## 2021-09-04 HISTORY — PX: POLYPECTOMY: SHX5525

## 2021-09-04 HISTORY — PX: BIOPSY: SHX5522

## 2021-09-04 HISTORY — PX: ESOPHAGOGASTRODUODENOSCOPY (EGD) WITH PROPOFOL: SHX5813

## 2021-09-04 HISTORY — PX: COLONOSCOPY WITH PROPOFOL: SHX5780

## 2021-09-04 SURGERY — COLONOSCOPY WITH PROPOFOL
Anesthesia: General

## 2021-09-04 SURGERY — COLONOSCOPY WITH PROPOFOL
Anesthesia: Monitor Anesthesia Care

## 2021-09-04 MED ORDER — PROPOFOL 10 MG/ML IV BOLUS
INTRAVENOUS | Status: AC
  Filled 2021-09-04: qty 20

## 2021-09-04 MED ORDER — PHENYLEPHRINE 40 MCG/ML (10ML) SYRINGE FOR IV PUSH (FOR BLOOD PRESSURE SUPPORT)
PREFILLED_SYRINGE | INTRAVENOUS | Status: DC | PRN
  Administered 2021-09-04 (×3): 80 ug via INTRAVENOUS

## 2021-09-04 MED ORDER — PROPOFOL 10 MG/ML IV BOLUS
INTRAVENOUS | Status: DC | PRN
  Administered 2021-09-04: 30 mg via INTRAVENOUS
  Administered 2021-09-04: 10 mg via INTRAVENOUS
  Administered 2021-09-04: 60 mg via INTRAVENOUS
  Administered 2021-09-04: 30 mg via INTRAVENOUS
  Administered 2021-09-04: 20 mg via INTRAVENOUS

## 2021-09-04 MED ORDER — STERILE WATER FOR IRRIGATION IR SOLN
Status: DC | PRN
  Administered 2021-09-04 (×2): 60 mL

## 2021-09-04 MED ORDER — GLUCAGON HCL RDNA (DIAGNOSTIC) 1 MG IJ SOLR
INTRAMUSCULAR | Status: AC
  Filled 2021-09-04: qty 1

## 2021-09-04 MED ORDER — PHENYLEPHRINE 40 MCG/ML (10ML) SYRINGE FOR IV PUSH (FOR BLOOD PRESSURE SUPPORT)
PREFILLED_SYRINGE | INTRAVENOUS | Status: AC
  Filled 2021-09-04: qty 10

## 2021-09-04 MED ORDER — SODIUM CHLORIDE 0.9 % IV SOLN: INTRAVENOUS | Status: DC

## 2021-09-04 MED ORDER — LACTATED RINGERS IV SOLN: INTRAVENOUS | Status: DC

## 2021-09-04 MED ORDER — GLUCAGON HCL RDNA (DIAGNOSTIC) 1 MG IJ SOLR
INTRAMUSCULAR | Status: DC | PRN
  Administered 2021-09-04: .5 mg via INTRAVENOUS

## 2021-09-04 MED ORDER — PROPOFOL 500 MG/50ML IV EMUL
INTRAVENOUS | Status: DC | PRN
  Administered 2021-09-04: 150 ug/kg/min via INTRAVENOUS

## 2021-09-04 MED ORDER — LIDOCAINE 2% (20 MG/ML) 5 ML SYRINGE
INTRAMUSCULAR | Status: DC | PRN
  Administered 2021-09-04: 60 mg via INTRAVENOUS

## 2021-09-04 NOTE — H&P (Signed)
@LOGO @   Primary Care Physician:  Redmond School, MD Primary Gastroenterologist:  Dr.   Pre-Procedure History & Physical: HPI:  Craig Scott is a 65 y.o. male here for high risk screening/surveillance colonoscopy given history of Lynch syndrome.  History of multiple colonic adenomas removed previously.  Multiple family members with colon cancer.  EGD over 10 years ago.  Patient denies melena, rectal bleeding change in bowel habits abdominal pain no reflux symptoms, odynophagia, dysphagia.  No weight loss.  Genetic markers positive for Lynch syndrome reportedly down at [facility] now a few years ago.  He is overdue for surveillance.  Past Medical History:  Diagnosis Date   Alcohol dependence, daily use (HCC)    Anxiety    Arthritis    ra   Back pain with history of spinal surgery    Bursitis    GERD (gastroesophageal reflux disease)    High cholesterol    History of hip surgery    Lynch syndrome    has gene that causes cancer   Neuropathy    both feet   Tubular adenoma     Past Surgical History:  Procedure Laterality Date   BASAL CELL CARCINOMA EXCISION     BUNIONECTOMY WITH HAMMERTOE RECONSTRUCTION AND GASTROC SLIDE Right 06/27/2015   Procedure: RIGHT GASTROC RECESSION/POSTERIOR TIBIAL TENOLYSIS/SECOND AND THIRD METATARSAL WEIL OSTEOTOMY AND HAMMERTOE CORRECTION;  Surgeon: Wylene Simmer, MD;  Location: [place];  Service: Orthopedics;  Laterality: Right;   CALCANEAL OSTEOTOMY Right 06/27/2015   Procedure: CALCANEAL OSTEOTOMY;  Surgeon: Wylene Simmer, MD;  Location: [place];  Service: Orthopedics;  Laterality: Right;   COLONOSCOPY   12/09/01   RMR: Normal normal rectum/ A few scattered left-sided diverticula.  The remainder of the colonic  mucosa appeared normal   COLONOSCOPY  12/15/2002   RMR: Normal rectum, scattered left-sided diverticula.  The remainder of the colonic mucosa appeared normal   COLONOSCOPY  04/10/2004   RMR: Normal  rectum/Sigmoid diverticula/The remainder of the colonic mucosa appeared normal   COLONOSCOPY  12/23/2005   RMR: Normal rectum, scattered sigmoid diverticula Colonic mucosa appeared normal   COLONOSCOPY    01/20/2008   RMR: Distal diminutive rectal polyps, status post cold biopsy removal  otherwise, normal rectum/ biopsy removal; scattered sigmoid diverticula; and benign-appearing/ulcers about the ileocecal valve, status post biopsy.  Remainder of colonic mucosa and terminal mucosa appeared normal.   COLONOSCOPY  05/22/2010   RMR: distal diminutive rectal polyp s/p bx otherwise normal/few scattered pancolonic diverticula   COLONOSCOPY  09/16/2012   Dr.Akanksha Bellmore- normal rectum, few scattered pancolonic diverticulosis, one diminutive polyp in the base of the cecum. 3x3 area of hyper pigmentation i the mid descending segment, this was a flat benign-appearing area o/w the remainder of the colonic mucosa appeared normal. bx= tubular adenoma and benign colonic mucosa   COLONOSCOPY WITH PROPOFOL N/A 09/05/2015   Procedure: COLONOSCOPY WITH PROPOFOL;  Surgeon: Daneil Dolin, MD;  [facility];  Service: Endoscopy;  Laterality: N/A;  0830   COLONOSCOPY WITH PROPOFOL N/A 05/24/2017   Procedure: COLONOSCOPY WITH PROPOFOL;  Surgeon: Daneil Dolin, MD;  [facility];  Service: Endoscopy;  Laterality: N/A;   ESOPHAGOGASTRODUODENOSCOPY  12/23/2005   RMR:   Esophagogastric peptic stricture with reflux esophagitis, status post  dilation as described above/ Small hiatal hernia, otherwise normal stomach.  Bulbar erosions otherwise normal D1 and D2   EYE SURGERY  as child   FOOT ARTHRODESIS Right 06/17/2017   Procedure:  Right Talonavicular and Subtalar Arthrodesis;  Surgeon: Wylene Simmer, MD;  Location: [place];  Service: Orthopedics;  Laterality: Right;   HARDWARE REMOVAL Right 06/25/2016   Procedure: REMOVAL OF DEEP IMPLANTS RIGHT MEDIAL CUNEIFORM, RIGHT CALCANEAL REMOVAL OF DEEP  IMPLANTS;  Surgeon: Wylene Simmer, MD;  Location: [place];  Service: Orthopedics;  Laterality: Right;   JOINT REPLACEMENT  09/30/2010   Right knee   KNEE SURGERY Right    bilat    NOSE SURGERY     cartiledge removal   POLYPECTOMY  09/05/2015   Procedure: POLYPECTOMY;  Surgeon: Daneil Dolin, MD;  [facility];  Service: Endoscopy;;   POLYPECTOMY  05/24/2017   Procedure: POLYPECTOMY;  Surgeon: Daneil Dolin, MD;  [facility];  Service: Endoscopy;;  colon   RHINOPLASTY     TOTAL HIP ARTHROPLASTY Left 03/20/2015   Procedure: LEFT TOTAL HIP ARTHROPLASTY ANTERIOR APPROACH;  Surgeon: Gaynelle Arabian, MD;  [facility];  Service: Orthopedics;  Laterality: Left;   TOTAL KNEE ARTHROPLASTY Left 10/23/2013   Procedure: LEFT TOTAL KNEE ARTHROPLASTY;  Surgeon: [name], MD;  Location: [facility];  Service: Orthopedics;  Laterality: Left;   TYMPANOSTOMY TUBE PLACEMENT Right    early 2014   TYMPANOSTOMY TUBE PLACEMENT      Prior to Admission medications   Medication Sig Start Date End Date Taking? Authorizing Provider  ALPRAZolam Duanne Moron) 1 MG tablet Take 1 mg by mouth 3 (three) times daily as needed for anxiety. anxiety   Yes [provider]  Apoaequorin (PREVAGEN) 10 MG CAPS Take 10 mg by mouth daily.   Yes [provider]  aspirin EC 81 MG tablet Take 1 tablet (81 mg total) by mouth 2 (two) times daily. Patient taking differently: Take 81 mg by mouth daily. 06/17/17  Yes Corky Sing, PA-C  celecoxib (CELEBREX) 200 MG capsule Take 200 mg by mouth at bedtime.   Yes [provider]  cetirizine (ZYRTEC) 10 MG tablet Take 10 mg by mouth daily.   Yes [provider]  HUMIRA PEN 40 MG/0.4ML PNKT Inject 40 mg into the skin every 14 (fourteen) days. 06/06/21  Yes [provider]  hydroxychloroquine (PLAQUENIL) 200 MG tablet Take 200 mg by mouth 2 (two) times daily. 07/04/21  Yes [provider]  Melatonin 3  MG TABS Take 3 mg by mouth at bedtime.   Yes [provider]  Misc Natural Products (GLUCOSAMINE CHOND MSM FORMULA) TABS Take 1 tablet by mouth daily.   Yes [provider]  Multiple Vitamin (MULTIVITAMIN WITH MINERALS) TABS tablet Take 1 tablet by mouth daily.   Yes [provider]  Niacin (VITAMIN B-3 PO) Take 1 tablet by mouth daily at 6 (six) AM.   Yes [provider]  Potassium 99 MG TABS Take 99 mg by mouth daily.   Yes [provider]  simvastatin (ZOCOR) 20 MG tablet Take 20 mg by mouth daily.    Yes [provider]  zinc gluconate 50 MG tablet Take 50 mg by mouth daily.   Yes [provider]  sildenafil (VIAGRA) 25 MG tablet Take 25 mg by mouth daily as needed. 07/04/21   [provider]    Allergies as of 08/12/2021 - Review Complete 08/12/2021  Allergen Reaction Noted   Lyrica [pregabalin] Other (See Comments) 03/14/2015    Family History  Problem Relation Age of Onset   Colon cancer Brother  3    Colon cancer Brother        40   Colon cancer Mother        age 38    Dementia Mother    Arthritis Mother    Heart attack Father    Colon cancer Cousin        15 years old onset   Colon cancer Cousin        93 years onset    Social History   Socioeconomic History   Marital status: Married    Spouse name: Not on file   Number of children: 2   Years of education: 12   Highest education level: Not on file  Occupational History   Occupation: [employer] manager: [employer]    Comment: [employer]  Tobacco Use   Smoking status: Former    Packs/day: 0.50    Years: 30.00    Pack years: 15.00    Types: Cigars, Cigarettes    Quit date: 09/14/2005    Years since quitting: 15.9   Smokeless tobacco: Never  Vaping Use   Vaping Use: Never used  Substance and Sexual Activity   Alcohol use: Yes    Comment: 8-9 beers/day; some days drinks more   Drug use: Not Currently    Types: Marijuana    Sexual activity: Never    Birth control/protection: None  Other Topics Concern   Not on file  Social History Narrative   ** Merged History Encounter **       Social Determinants of Health   Financial Resource Strain: Not on file  Food Insecurity: Not on file  Transportation Needs: Not on file  Physical Activity: Not on file  Stress: Not on file  Social Connections: Not on file  Intimate Partner Violence: Not on file    Review of Systems: See HPI, otherwise negative ROS  Physical Exam: BP (!) 143/86    Pulse 83    Temp 98.1 F (36.7 C) (Oral)    Resp 20    SpO2 97%  General:   Alert,  Well-developed, well-nourished, pleasant and cooperative in NAD Neck:  Supple; no masses or thyromegaly. No significant cervical adenopathy. Lungs:  Clear throughout to auscultation.   No wheezes, crackles, or rhonchi. No acute distress. Heart:  Regular rate and rhythm; no murmurs, clicks, rubs,  or gallops. Abdomen: Non-distended, normal bowel sounds.  Soft and nontender without appreciable mass or hepatosplenomegaly.  Pulses:  Normal pulses noted. Extremities:  Without clubbing or edema.  Impression/Plan: 65 year old gentleman Lynch syndrome, positive family history of colon cancer personal history of multiple colonic adenomas.  Here for high risk surveillance colonoscopy.  I have also discussed the importance of getting an EGD done in getting a good look at the ampulla as well today.  Risk benefits limitations alternatives have been discussed questions answered he is agreeable to both EGD and colonoscopy  Further recommendations to follow.  ASA 3/propofol.  Discussed with anesthesia.     Notice: This dictation was prepared with Dragon dictation along with smaller phrase technology. Any transcriptional errors that result from this process are unintentional and may not be corrected upon review.

## 2021-09-04 NOTE — Anesthesia Preprocedure Evaluation (Addendum)
Anesthesia Evaluation  Patient identified by MRN, date of birth, ID band Patient awake    Reviewed: Allergy & Precautions, NPO status , Patient's Chart, lab work & pertinent test results  Airway Mallampati: II  TM Distance: >3 FB Neck ROM: Full    Dental  (+) Dental Advisory Given, Teeth Intact   Pulmonary former smoker,    Pulmonary exam normal breath sounds clear to auscultation       Cardiovascular negative cardio ROS Normal cardiovascular exam Rhythm:Regular Rate:Normal     Neuro/Psych PSYCHIATRIC DISORDERS Anxiety negative neurological ROS     GI/Hepatic GERD  ,(+)     substance abuse  alcohol use,   Endo/Other  negative endocrine ROS  Renal/GU negative Renal ROS  negative genitourinary   Musculoskeletal  (+) Arthritis , Osteoarthritis,    Abdominal   Peds negative pediatric ROS (+)  Hematology negative hematology ROS (+)   Anesthesia Other Findings   Reproductive/Obstetrics negative OB ROS                            Anesthesia Physical Anesthesia Plan  ASA: 3  Anesthesia Plan: General   Post-op Pain Management: Minimal or no pain anticipated   Induction:   PONV Risk Score and Plan: Propofol infusion  Airway Management Planned: Nasal Cannula, Natural Airway and Simple Face Mask  Additional Equipment:   Intra-op Plan:   Post-operative Plan:   Informed Consent: I have reviewed the patients History and Physical, chart, labs and discussed the procedure including the risks, benefits and alternatives for the proposed anesthesia with the patient or authorized representative who has indicated his/her understanding and acceptance.     Dental advisory given  Plan Discussed with: CRNA and Surgeon  Anesthesia Plan Comments:       Anesthesia Quick Evaluation

## 2021-09-04 NOTE — Op Note (Signed)
[facility] Patient Name: Craig Scott Procedure Date: 09/04/2021 12:52 PM MRN: [id number] Date of Birth: 1956-02-20 Attending MD: Norvel Richards , MD CSN: [id number] Age: 65 Admit Type: Outpatient Procedure:                Colonoscopy Indications:              High risk colon cancer surveillance: Personal                            history of colonic polyps Providers:                Norvel Richards, MD, Caprice Kluver, Kristine L.                            Risa Grill, Technician Referring MD:              Medicines:                Propofol per Anesthesia Complications:            No immediate complications. Estimated Blood Loss:     Estimated blood loss was minimal. Procedure:                Pre-Anesthesia Assessment:                           - Prior to the procedure, a History and Physical                            was performed, and patient medications and                            allergies were reviewed. The patient's tolerance of                            previous anesthesia was also reviewed. The risks                            and benefits of the procedure and the sedation                            options and risks were discussed with the patient.                            All questions were answered, and informed consent                            was obtained. Prior Anticoagulants: The patient has                            taken no previous anticoagulant or antiplatelet                            agents. ASA Grade Assessment: III - A patient with  severe systemic disease. After reviewing the risks                            and benefits, the patient was deemed in                            satisfactory condition to undergo the procedure.                           After obtaining informed consent, the colonoscope                            was passed under direct vision. Throughout the                            procedure,  the patient's blood pressure, pulse, and                            oxygen saturations were monitored continuously. The                            [id number]) scope was introduced through the                            anus and advanced to the the cecum, identified by                            appendiceal orifice and ileocecal valve. The                            colonoscopy was performed without difficulty. The                            patient tolerated the procedure well. The quality                            of the bowel preparation was adequate. Scope In: 1:12:57 PM Scope Out: 1:43:29 PM Scope Withdrawal Time: 0 hours 24 minutes 13 seconds  Total Procedure Duration: 0 hours 30 minutes 32 seconds  Findings:      The perianal and digital rectal examinations were normal.      Five semi-pedunculated polyps were found in the rectum, distal rectum,       recto-sigmoid colon and ascending colon. The polyps were 3 to 6 mm in       size (largest in the ascending segment). These polyps were removed with       a cold snare. Resection and retrieval were complete. Estimated blood       loss was minimal.      The exam was otherwise without abnormality on direct and retroflexion       views. Impression:               - Five 3 to 6 mm polyps in the rectum, in the  distal rectum, at the recto-sigmoid colon and in                            the ascending colon, removed with a cold snare.                            Resected and retrieved.                           - The examination was otherwise normal on direct                            and retroflexion views. Moderate Sedation:      Moderate (conscious) sedation was personally administered by an       anesthesia professional. The following parameters were monitored: oxygen       saturation, heart rate, blood pressure, respiratory rate, EKG, adequacy       of pulmonary ventilation, and response to  care. Recommendation:           - Patient has a contact number available for                            emergencies. The signs and symptoms of potential                            delayed complications were discussed with the                            patient. Return to normal activities tomorrow.                            Written discharge instructions were provided to the                            patient.                           - Resume previous diet.                           - Continue present medications.                           - Repeat colonoscopy after studies are complete for                            surveillance.                           - Return to GI office after studies are complete.                            See EGD report Procedure Code(s):        --- Professional ---  45385, Colonoscopy, flexible; with removal of                            tumor(s), polyp(s), or other lesion(s) by snare                            technique Diagnosis Code(s):        --- Professional ---                           Z86.010, Personal history of colonic polyps                           K62.1, Rectal polyp                           K63.5, Polyp of colon CPT copyright 2019 [employer]. All rights reserved. The codes documented in this report are preliminary and upon coder review may  be revised to meet current compliance requirements. [name]. Meridee Branum, MD Norvel Richards, MD 09/04/2021 2:13:20 PM This report has been signed electronically. Number of Addenda: 0

## 2021-09-04 NOTE — Discharge Instructions (Signed)
Colonoscopy Discharge Instructions  Read the instructions outlined below and refer to this sheet in the next few weeks. These discharge instructions provide you with general information on caring for yourself after you leave the hospital. Your doctor may also give you specific instructions. While your treatment has been planned according to the most current medical practices available, unavoidable complications occasionally occur. If you have any problems or questions after discharge, call Dr. Gala Romney at [phone]. ACTIVITY You may resume your regular activity, but move at a slower pace for the next 24 hours.  Take frequent rest periods for the next 24 hours.  Walking will help get rid of the air and reduce the bloated feeling in your belly (abdomen).  No driving for 24 hours (because of the medicine (anesthesia) used during the test).   Do not sign any important legal documents or operate any machinery for 24 hours (because of the anesthesia used during the test).  NUTRITION Drink plenty of fluids.  You may resume your normal diet as instructed by your doctor.  Begin with a light meal and progress to your normal diet. Heavy or fried foods are harder to digest and may make you feel sick to your stomach (nauseated).  Avoid alcoholic beverages for 24 hours or as instructed.  MEDICATIONS You may resume your normal medications unless your doctor tells you otherwise.  WHAT YOU CAN EXPECT TODAY Some feelings of bloating in the abdomen.  Passage of more gas than usual.  Spotting of blood in your stool or on the toilet paper.  IF YOU HAD POLYPS REMOVED DURING THE COLONOSCOPY: No aspirin products for 7 days or as instructed.  No alcohol for 7 days or as instructed.  Eat a soft diet for the next 24 hours.  FINDING OUT THE RESULTS OF YOUR TEST Not all test results are available during your visit. If your test results are not back during the visit, make an appointment with your caregiver to find out the  results. Do not assume everything is normal if you have not heard from your caregiver or the medical facility. It is important for you to follow up on all of your test results.  SEEK IMMEDIATE MEDICAL ATTENTION IF: You have more than a spotting of blood in your stool.  Your belly is swollen (abdominal distention).  You are nauseated or vomiting.  You have a temperature over 101.  You have abdominal pain or discomfort that is severe or gets worse throughout the day.   EGD Discharge instructions Please read the instructions outlined below and refer to this sheet in the next few weeks. These discharge instructions provide you with general information on caring for yourself after you leave the hospital. Your doctor may also give you specific instructions. While your treatment has been planned according to the most current medical practices available, unavoidable complications occasionally occur. If you have any problems or questions after discharge, please call your doctor. ACTIVITY You may resume your regular activity but move at a slower pace for the next 24 hours.  Take frequent rest periods for the next 24 hours.  Walking will help expel (get rid of) the air and reduce the bloated feeling in your abdomen.  No driving for 24 hours (because of the anesthesia (medicine) used during the test).  You may shower.  Do not sign any important legal documents or operate any machinery for 24 hours (because of the anesthesia used during the test).  NUTRITION Drink plenty of fluids.  You may  resume your normal diet.  Begin with a light meal and progress to your normal diet.  Avoid alcoholic beverages for 24 hours or as instructed by your caregiver.  MEDICATIONS You may resume your normal medications unless your caregiver tells you otherwise.  WHAT YOU CAN EXPECT TODAY You may experience abdominal discomfort such as a feeling of fullness or gas pains.  FOLLOW-UP Your doctor will discuss the results of  your test with you.  SEEK IMMEDIATE MEDICAL ATTENTION IF ANY OF THE FOLLOWING OCCUR: Excessive nausea (feeling sick to your stomach) and/or vomiting.  Severe abdominal pain and distention (swelling).  Trouble swallowing.  Temperature over 101 F (37.8 C).  Rectal bleeding or vomiting of blood.    Your stomach and small intestine looked a little inflamed.  No evidence of tumor.  I did take some biopsies  A total of 5 polyps removed in your colon.  None were very large.  I did not see a tumor anywhere  Further recommendations to follow pending review of pathology report  At patient request, I called Lelon Frohlich at [phone] -no answer

## 2021-09-04 NOTE — Transfer of Care (Signed)
Immediate Anesthesia Transfer of Care Note  Patient: Craig Scott  Procedure(s) Performed: COLONOSCOPY WITH PROPOFOL ESOPHAGOGASTRODUODENOSCOPY (EGD) WITH PROPOFOL POLYPECTOMY BIOPSY  Patient Location: PACU  Anesthesia Type:MAC  Level of Consciousness: sedated and responds to stimulation  Airway & Oxygen Therapy: Patient Spontanous Breathing and Patient connected to nasal cannula oxygen  Post-op Assessment: Report given to RN and Post -op Vital signs reviewed and stable  Post vital signs: Reviewed and stable  Last Vitals:  Vitals Value Taken Time  BP 103/57 09/04/21 1406  Temp    Pulse 75 09/04/21 1406  Resp 19 09/04/21 1406  SpO2 95 % 09/04/21 1406    Last Pain:  Vitals:   09/04/21 1406  TempSrc: Oral  PainSc:       Patients Stated Pain Goal: 8 (19/75/88 3254)  Complications: No notable events documented.

## 2021-09-04 NOTE — Anesthesia Postprocedure Evaluation (Signed)
Anesthesia Post Note  Patient: Craig Scott  Procedure(s) Performed: COLONOSCOPY WITH PROPOFOL ESOPHAGOGASTRODUODENOSCOPY (EGD) WITH PROPOFOL POLYPECTOMY BIOPSY  Patient location during evaluation: Phase II Anesthesia Type: General Level of consciousness: awake and alert and oriented Pain management: pain level controlled Vital Signs Assessment: post-procedure vital signs reviewed and stable Respiratory status: spontaneous breathing, nonlabored ventilation and respiratory function stable Cardiovascular status: blood pressure returned to baseline and stable Postop Assessment: no apparent nausea or vomiting Anesthetic complications: no   No notable events documented.   Last Vitals:  Vitals:   09/04/21 1224 09/04/21 1406  BP: (!) 143/86 (!) 103/57  Pulse: 83 75  Resp: 20 19  Temp: 36.7 C 36.9 C  SpO2: 97% 95%    Last Pain:  Vitals:   09/04/21 1406  TempSrc: Oral  PainSc: 0-No pain                 Marlyn Tondreau C Leeloo Silverthorne

## 2021-09-09 ENCOUNTER — Encounter (HOSPITAL_BASED_OUTPATIENT_CLINIC_OR_DEPARTMENT_OTHER): Payer: Self-pay | Admitting: Urology

## 2021-09-09 ENCOUNTER — Other Ambulatory Visit: Payer: Self-pay

## 2021-09-09 LAB — SURGICAL PATHOLOGY

## 2021-09-09 NOTE — Progress Notes (Signed)
Spoke w/ via phone for pre-op interview---pt Lab needs dos----cmp if mda  (ordered in epic) wants has cmp 09-02-2021 done before colonscopy epic            Lab results------see above COVID test -----patient states asymptomatic no test needed Arrive at -------1000 am 09-12-2021 NPO after MN NO Solid Food.  Clear liquids from MN until---900 Med rec completed Medications to take morning of surgery -----xanax prn, pt wishes to take no other meds dos Diabetic medication -----n/a Patient instructed no nail polish to be worn day of surgery Patient instructed to bring photo id and insurance card day of surgery Patient aware to have Driver (ride ) Craig Scott friend / caregiver  wife Craig Scott   for 24 hours after surgery   Patient Special Instructions -----none Pre-Op special Istructions -----none Patient verbalized understanding of instructions that were given at this phone interview. Patient denies shortness of breath, chest pain, fever, cough at this phone interview.

## 2021-09-10 ENCOUNTER — Encounter: Payer: Self-pay | Admitting: Internal Medicine

## 2021-09-11 ENCOUNTER — Encounter (HOSPITAL_BASED_OUTPATIENT_CLINIC_OR_DEPARTMENT_OTHER): Payer: Self-pay | Admitting: Urology

## 2021-09-12 ENCOUNTER — Ambulatory Visit (HOSPITAL_BASED_OUTPATIENT_CLINIC_OR_DEPARTMENT_OTHER): Payer: BC Managed Care – PPO | Admitting: Anesthesiology

## 2021-09-12 ENCOUNTER — Encounter (HOSPITAL_BASED_OUTPATIENT_CLINIC_OR_DEPARTMENT_OTHER): Payer: Self-pay | Admitting: Urology

## 2021-09-12 ENCOUNTER — Encounter (HOSPITAL_BASED_OUTPATIENT_CLINIC_OR_DEPARTMENT_OTHER)
Admission: RE | Disposition: A | Discharge status: Home / Self Care | Payer: Self-pay | Source: Home / Self Care | Attending: Urology

## 2021-09-12 ENCOUNTER — Other Ambulatory Visit: Payer: Self-pay

## 2021-09-12 ENCOUNTER — Ambulatory Visit (HOSPITAL_BASED_OUTPATIENT_CLINIC_OR_DEPARTMENT_OTHER)
Admission: RE | Admit: 2021-09-12 | Discharge: 2021-09-12 | Disposition: A | Discharge status: Home / Self Care | Payer: BC Managed Care – PPO | Attending: Urology | Admitting: Urology

## 2021-09-12 DIAGNOSIS — N3289 Other specified disorders of bladder: Secondary | ICD-10-CM

## 2021-09-12 DIAGNOSIS — R350 Frequency of micturition: Secondary | ICD-10-CM | POA: Diagnosis not present

## 2021-09-12 DIAGNOSIS — Z1509 Genetic susceptibility to other malignant neoplasm: Secondary | ICD-10-CM | POA: Insufficient documentation

## 2021-09-12 DIAGNOSIS — M069 Rheumatoid arthritis, unspecified: Secondary | ICD-10-CM | POA: Diagnosis not present

## 2021-09-12 DIAGNOSIS — N3031 Trigonitis with hematuria: Secondary | ICD-10-CM | POA: Insufficient documentation

## 2021-09-12 DIAGNOSIS — N529 Male erectile dysfunction, unspecified: Secondary | ICD-10-CM | POA: Diagnosis not present

## 2021-09-12 DIAGNOSIS — K130 Diseases of lips: Secondary | ICD-10-CM | POA: Diagnosis not present

## 2021-09-12 DIAGNOSIS — Z87891 Personal history of nicotine dependence: Secondary | ICD-10-CM | POA: Insufficient documentation

## 2021-09-12 DIAGNOSIS — K219 Gastro-esophageal reflux disease without esophagitis: Secondary | ICD-10-CM | POA: Insufficient documentation

## 2021-09-12 DIAGNOSIS — N401 Enlarged prostate with lower urinary tract symptoms: Secondary | ICD-10-CM | POA: Insufficient documentation

## 2021-09-12 DIAGNOSIS — Z789 Other specified health status: Secondary | ICD-10-CM

## 2021-09-12 HISTORY — PX: CYSTOSCOPY WITH BIOPSY: SHX5122

## 2021-09-12 HISTORY — DX: Presence of spectacles and contact lenses: Z97.3

## 2021-09-12 HISTORY — DX: Frequency of micturition: R35.0

## 2021-09-12 HISTORY — DX: Personal history of COVID-19: Z86.16

## 2021-09-12 HISTORY — DX: Personal history of traumatic brain injury: Z87.820

## 2021-09-12 SURGERY — CYSTOSCOPY, WITH BIOPSY
Anesthesia: General

## 2021-09-12 MED ORDER — OXYCODONE HCL 5 MG PO TABS: 5.0000 mg | ORAL_TABLET | Freq: Once | ORAL | Status: DC | PRN

## 2021-09-12 MED ORDER — ONDANSETRON HCL 4 MG/2ML IJ SOLN: 4.0000 mg | Freq: Once | INTRAMUSCULAR | Status: DC | PRN

## 2021-09-12 MED ORDER — DEXMEDETOMIDINE (PRECEDEX) IN NS 20 MCG/5ML (4 MCG/ML) IV SYRINGE
PREFILLED_SYRINGE | INTRAVENOUS | Status: DC | PRN
  Administered 2021-09-12: 8 ug via INTRAVENOUS

## 2021-09-12 MED ORDER — PROPOFOL 10 MG/ML IV BOLUS
INTRAVENOUS | Status: DC | PRN
  Administered 2021-09-12: 200 mg via INTRAVENOUS

## 2021-09-12 MED ORDER — HYDROMORPHONE HCL 1 MG/ML IJ SOLN: 0.2500 mg | INTRAMUSCULAR | Status: DC | PRN

## 2021-09-12 MED ORDER — FENTANYL CITRATE (PF) 250 MCG/5ML IJ SOLN
INTRAMUSCULAR | Status: AC
  Filled 2021-09-12: qty 5

## 2021-09-12 MED ORDER — KETOROLAC TROMETHAMINE 30 MG/ML IJ SOLN
INTRAMUSCULAR | Status: AC
  Filled 2021-09-12: qty 1

## 2021-09-12 MED ORDER — ONDANSETRON HCL 4 MG/2ML IJ SOLN
INTRAMUSCULAR | Status: DC | PRN
  Administered 2021-09-12: 4 mg via INTRAVENOUS

## 2021-09-12 MED ORDER — LIDOCAINE 2% (20 MG/ML) 5 ML SYRINGE
INTRAMUSCULAR | Status: DC | PRN
  Administered 2021-09-12: 60 mg via INTRAVENOUS

## 2021-09-12 MED ORDER — HYDROCODONE-ACETAMINOPHEN 7.5-325 MG PO TABS: 1.0000 | ORAL_TABLET | Freq: Four times a day (QID) | ORAL | 0 refills | Status: DC | PRN

## 2021-09-12 MED ORDER — PHENAZOPYRIDINE HCL 200 MG PO TABS
ORAL | Status: DC | PRN
  Administered 2021-09-12: 13:00:00 15 mL via INTRAVESICAL

## 2021-09-12 MED ORDER — STERILE WATER FOR IRRIGATION IR SOLN
Status: DC | PRN
  Administered 2021-09-12: 3000 mL

## 2021-09-12 MED ORDER — MIDAZOLAM HCL 5 MG/5ML IJ SOLN
INTRAMUSCULAR | Status: DC | PRN
  Administered 2021-09-12: 2 mg via INTRAVENOUS

## 2021-09-12 MED ORDER — ACETAMINOPHEN 500 MG PO TABS
1000.0000 mg | ORAL_TABLET | Freq: Once | ORAL | Status: AC
  Administered 2021-09-12: 11:00:00 1000 mg via ORAL

## 2021-09-12 MED ORDER — PROPOFOL 10 MG/ML IV BOLUS
INTRAVENOUS | Status: AC
  Filled 2021-09-12: qty 20

## 2021-09-12 MED ORDER — DEXAMETHASONE SODIUM PHOSPHATE 4 MG/ML IJ SOLN
INTRAMUSCULAR | Status: DC | PRN
  Administered 2021-09-12: 10 mg via INTRAVENOUS

## 2021-09-12 MED ORDER — MIDAZOLAM HCL 2 MG/2ML IJ SOLN
INTRAMUSCULAR | Status: AC
  Filled 2021-09-12: qty 2

## 2021-09-12 MED ORDER — LACTATED RINGERS IV SOLN
INTRAVENOUS | Status: DC
  Administered 2021-09-12: 11:00:00 1000 mL via INTRAVENOUS

## 2021-09-12 MED ORDER — OXYCODONE HCL 5 MG/5ML PO SOLN: 5.0000 mg | Freq: Once | ORAL | Status: DC | PRN

## 2021-09-12 MED ORDER — CEFAZOLIN SODIUM-DEXTROSE 2-4 GM/100ML-% IV SOLN
2.0000 g | Freq: Once | INTRAVENOUS | Status: AC
  Administered 2021-09-12: 13:00:00 2 g via INTRAVENOUS

## 2021-09-12 MED ORDER — CEFAZOLIN SODIUM-DEXTROSE 2-4 GM/100ML-% IV SOLN
INTRAVENOUS | Status: AC
  Filled 2021-09-12: qty 100

## 2021-09-12 MED ORDER — FENTANYL CITRATE (PF) 100 MCG/2ML IJ SOLN
INTRAMUSCULAR | Status: DC | PRN
  Administered 2021-09-12 (×4): 50 ug via INTRAVENOUS

## 2021-09-12 MED ORDER — ONDANSETRON HCL 4 MG/2ML IJ SOLN
INTRAMUSCULAR | Status: AC
  Filled 2021-09-12: qty 2

## 2021-09-12 MED ORDER — ACETAMINOPHEN 500 MG PO TABS
ORAL_TABLET | ORAL | Status: AC
  Filled 2021-09-12: qty 2

## 2021-09-12 MED ORDER — DEXAMETHASONE SODIUM PHOSPHATE 10 MG/ML IJ SOLN
INTRAMUSCULAR | Status: AC
  Filled 2021-09-12: qty 1

## 2021-09-12 MED ORDER — STERILE WATER FOR IRRIGATION IR SOLN
Status: DC | PRN
  Administered 2021-09-12: 500 mL

## 2021-09-12 SURGICAL SUPPLY — 20 items
BAG DRAIN URO-CYSTO SKYTR STRL (DRAIN) ×3 IMPLANT
BAG DRN UROCATH (DRAIN) ×1
CATH ROBINSON RED A/P 16FR (CATHETERS) ×1 IMPLANT
CLOTH BEACON ORANGE TIMEOUT ST (SAFETY) ×3 IMPLANT
ELECT REM PT RETURN 9FT ADLT (ELECTROSURGICAL) ×2
ELECTRODE REM PT RTRN 9FT ADLT (ELECTROSURGICAL) ×2 IMPLANT
GLOVE SURG ENC MOIS LTX SZ7.5 (GLOVE) ×3 IMPLANT
GLOVE SURG POLYISO LF SZ7 (GLOVE) ×1 IMPLANT
GLOVE SURG UNDER POLY LF SZ6.5 (GLOVE) ×1 IMPLANT
GOWN STRL REUS W/ TWL LRG LVL3 (GOWN DISPOSABLE) IMPLANT
GOWN STRL REUS W/TWL LRG LVL3 (GOWN DISPOSABLE) ×6 IMPLANT
KIT TURNOVER CYSTO (KITS) ×3 IMPLANT
MANIFOLD NEPTUNE II (INSTRUMENTS) ×3 IMPLANT
NDL SAFETY ECLIPSE 18X1.5 (NEEDLE) IMPLANT
NEEDLE HYPO 18GX1.5 SHARP (NEEDLE) ×2
PACK CYSTO (CUSTOM PROCEDURE TRAY) ×3 IMPLANT
SYR 20ML LL LF (SYRINGE) ×1 IMPLANT
TUBE CONNECTING 12X1/4 (SUCTIONS) ×3 IMPLANT
WATER STERILE IRR 3000ML UROMA (IV SOLUTION) ×3 IMPLANT
WATER STERILE IRR 500ML POUR (IV SOLUTION) ×1 IMPLANT

## 2021-09-12 NOTE — Anesthesia Preprocedure Evaluation (Addendum)
Anesthesia Evaluation  Patient identified by MRN, date of birth, ID band Patient awake    Reviewed: Allergy & Precautions, NPO status , Patient's Chart, lab work & pertinent test results  Airway Mallampati: III  TM Distance: >3 FB Neck ROM: Full    Dental  (+) Teeth Intact, Dental Advisory Given Small lesion on lower left lip- is in the process of having it biopsied/removed :   Pulmonary former smoker,    Pulmonary exam normal breath sounds clear to auscultation       Cardiovascular negative cardio ROS Normal cardiovascular exam Rhythm:Regular Rate:Normal     Neuro/Psych PSYCHIATRIC DISORDERS Anxiety negative neurological ROS     GI/Hepatic GERD  Controlled,(+)     substance abuse  alcohol use, 9-10 drinks/d, had 5-6 beers yesterday Was told by gastroenterologist that he requires a lot of anesthesia for cscopes- has to be anesthetized by an anesthesia team now instead of in-office procedural sedation States he has never withdrawn from alcohol, but has visible tremor    Endo/Other  negative endocrine ROS  Renal/GU negative Renal ROS Bladder dysfunction      Musculoskeletal  (+) Arthritis , Rheumatoid disorders,    Abdominal (+) + obese,   Peds  Hematology negative hematology ROS (+)   Anesthesia Other Findings   Reproductive/Obstetrics negative OB ROS                            Anesthesia Physical Anesthesia Plan  ASA: 3  Anesthesia Plan: General   Post-op Pain Management: Toradol IV (intra-op) and Tylenol PO (pre-op)   Induction: Intravenous  PONV Risk Score and Plan: 4 or greater and Ondansetron, Dexamethasone, Midazolam and Treatment may vary due to age or medical condition  Airway Management Planned: LMA  Additional Equipment: None  Intra-op Plan:   Post-operative Plan: Extubation in OR  Informed Consent: I have reviewed the patients History and Physical, chart, labs  and discussed the procedure including the risks, benefits and alternatives for the proposed anesthesia with the patient or authorized representative who has indicated his/her understanding and acceptance.     Dental advisory given  Plan Discussed with: CRNA  Anesthesia Plan Comments: (precedex for alcoholism )       Anesthesia Quick Evaluation

## 2021-09-12 NOTE — Transfer of Care (Signed)
Immediate Anesthesia Transfer of Care Note  Patient: Craig Scott  Procedure(s) Performed: CYSTOSCOPY WITH BLADDERBIOPSY/ FULGURATION/ INSTILLATION OF MARCAINE/PYRIDIUM  Patient Location: PACU  Anesthesia Type:General  Level of Consciousness: awake  Airway & Oxygen Therapy: Patient Spontanous Breathing and Patient connected to face mask oxygen  Post-op Assessment: Report given to RN and Post -op Vital signs reviewed and stable  Post vital signs: Reviewed and stable  Last Vitals:  Vitals Value Taken Time  BP 138/94 09/12/21 1325  Temp    Pulse 77 09/12/21 1329  Resp 12 09/12/21 1329  SpO2 100 % 09/12/21 1329  Vitals shown include unvalidated device data.  Last Pain:  Vitals:   09/12/21 1024  TempSrc: Oral  PainSc: 5       Patients Stated Pain Goal: 7 (99/77/41 4239)  Complications: No notable events documented.

## 2021-09-12 NOTE — H&P (Signed)
H&P  Chief Complaint: frequency, dysuria  History of Present Illness:   1) BPH - patient with frequency. He has a good stream. He has not had prostate surgery and on no meds. PVR 49 ml. Voids with frequency and dysuria.  PSA Oct 2022 was 0.5. Cystoscopy November 2022 revealed a borderline obstructing prostate, no urethral stricture, 2 conspicuous areas of erythema in the bladder.  Urine cytology was negative.  He drinks a lot of "beer and water" which causes some frequency.   2) gross hematuria - for past few weeks he has noted red urine. Microscopic hematuria-noted on UA Oct 2022 - greater than 30 red blood cells.  No bacteria. He does have Lynch syndrome. Remote smoker.   Cystoscopy November 2022 revealed a borderline obstructing prostate, no urethral stricture, 2 conspicuous areas of erythema in the bladder.    Cytology - negative Nov 2022.    Imaging: Jan 2020 CT scan a/p reported with a 9 mm left renal lesion that needed further evaluation.  Aug 2022 - abd [place] [facility] reported as normal and 11 mm left renal cyst.   Oct 2022 CT a/p - benign. 9 mm left renal lesion/cyst stable.      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    Today, seen for continued evaluation of frequency and dysuria.  He had 2 areas of erythema on office cystoscopy and did not tolerate office cystoscopy well.  He needs a better look today in the operating room.  He continues to have some baseline dysuria but no new changes.  No gross hematuria or fever.  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 16 yrs ago   Tubular adenoma    Urinary frequency    Wears glasses    Past Surgical History:  Procedure Laterality Date   BASAL CELL CARCINOMA EXCISION     BUNIONECTOMY WITH HAMMERTOE RECONSTRUCTION AND GASTROC SLIDE Right 06/27/2015   Procedure: RIGHT GASTROC RECESSION/POSTERIOR TIBIAL TENOLYSIS/SECOND AND THIRD METATARSAL WEIL OSTEOTOMY AND HAMMERTOE CORRECTION;  Surgeon: Wylene Simmer, MD;  Location: [place];  Service: Orthopedics;  Laterality: Right;   CALCANEAL OSTEOTOMY Right 06/27/2015   Procedure: CALCANEAL OSTEOTOMY;  Surgeon: Wylene Simmer, MD;  Location: [place];  Service: Orthopedics;  Laterality: Right;   COLONOSCOPY  12/09/2001   RMR: Normal normal rectum/ A few scattered left-sided diverticula.  The remainder of the colonic  mucosa appeared normal   COLONOSCOPY  12/15/2002   RMR: Normal rectum, scattered left-sided diverticula.  The remainder of the colonic mucosa appeared normal   COLONOSCOPY  04/10/2004   RMR: Normal rectum/Sigmoid diverticula/The remainder of the colonic mucosa appeared normal   COLONOSCOPY  12/23/2005   RMR: Normal rectum, scattered sigmoid diverticula Colonic mucosa appeared normal   COLONOSCOPY  01/20/2008  RMR: Distal diminutive rectal polyps, status post cold biopsy removal  otherwise, normal rectum/ biopsy removal; scattered sigmoid diverticula; and benign-appearing/ulcers about the ileocecal valve, status post biopsy.  Remainder of colonic mucosa and terminal mucosa appeared normal.   COLONOSCOPY  05/22/2010   RMR: distal diminutive rectal polyp s/p bx otherwise normal/few scattered pancolonic diverticula   COLONOSCOPY  09/16/2012   Dr.Rourk- normal rectum, few scattered pancolonic diverticulosis, one diminutive polyp in the base of the cecum. 3x3 area of hyper pigmentation i the mid descending segment, this was a flat  benign-appearing area o/w the remainder of the colonic mucosa appeared normal. bx= tubular adenoma and benign colonic mucosa   COLONOSCOPY WITH PROPOFOL N/A 09/05/2015   Procedure: COLONOSCOPY WITH PROPOFOL;  Surgeon: Daneil Dolin, MD;  [facility];  Service: Endoscopy;  Laterality: N/A;  0830   COLONOSCOPY WITH PROPOFOL N/A 05/24/2017   Procedure: COLONOSCOPY WITH PROPOFOL;  Surgeon: Daneil Dolin, MD;  [facility];  Service: Endoscopy;  Laterality: N/A;   colonscopy  09/04/2021   @ [place]   ESOPHAGOGASTRODUODENOSCOPY  12/23/2005   RMR:   Esophagogastric peptic stricture with reflux esophagitis, status post  dilation as described above/ Small hiatal hernia, otherwise normal stomach.  Bulbar erosions otherwise normal D1 and D2   EYE SURGERY  as child   FOOT ARTHRODESIS Right 06/17/2017   Procedure: Right Talonavicular and Subtalar Arthrodesis;  Surgeon: Wylene Simmer, MD;  Location: [place];  Service: Orthopedics;  Laterality: Right;   HARDWARE REMOVAL Right 06/25/2016   Procedure: REMOVAL OF DEEP IMPLANTS RIGHT MEDIAL CUNEIFORM, RIGHT CALCANEAL REMOVAL OF DEEP IMPLANTS;  Surgeon: Wylene Simmer, MD;  Location: [place];  Service: Orthopedics;  Laterality: Right;   JOINT REPLACEMENT  09/30/2010   Right knee   KNEE SURGERY Right    bilat    NOSE SURGERY Left    cartiledge removal   POLYPECTOMY  09/05/2015   Procedure: POLYPECTOMY;  Surgeon: Daneil Dolin, MD;  [facility];  Service: Endoscopy;;   POLYPECTOMY  05/24/2017   Procedure: POLYPECTOMY;  Surgeon: Daneil Dolin, MD;  [facility];  Service: Endoscopy;;  colon   RHINOPLASTY     TOTAL HIP ARTHROPLASTY Left 03/20/2015   Procedure: LEFT TOTAL HIP ARTHROPLASTY ANTERIOR APPROACH;  Surgeon: Gaynelle Arabian, MD;  Location[facility];  Service: Orthopedics;  Laterality: Left;   TOTAL KNEE ARTHROPLASTY Left 10/23/2013   Procedure: LEFT TOTAL KNEE  ARTHROPLASTY;  Surgeon: [name], MD;  Location: [facility];  Service: Orthopedics;  Laterality: Left;   TYMPANOSTOMY TUBE PLACEMENT Right    early 2014 and multiple times   TYMPANOSTOMY TUBE PLACEMENT      Home Medications:  Medications Prior to Admission  Medication Sig Dispense Refill Last Dose   ALPRAZolam (XANAX) 1 MG tablet Take 1 mg by mouth 2 (two) times daily. Anxiety can take up to 2 extra prn   09/11/2021   Apoaequorin (PREVAGEN) 10 MG CAPS Take 10 mg by mouth daily.   Past Week   aspirin EC 81 MG tablet Take 1 tablet (81 mg total) by mouth 2 (two) times daily. (Patient taking differently: Take 81 mg by mouth.) 84 tablet 0 Past Month   celecoxib (CELEBREX) 200 MG capsule Take 200 mg by mouth at bedtime.   Past Week   cetirizine (ZYRTEC) 10 MG tablet Take 10 mg by mouth daily.   Past Week   hydroxychloroquine (PLAQUENIL) 200 MG tablet  Take 400 mg by mouth daily.   Past Week   Melatonin 3 MG TABS Take 3 mg by mouth at bedtime.   09/11/2021   Multiple Vitamin (MULTIVITAMIN WITH MINERALS) TABS tablet Take 1 tablet by mouth daily.   Past Week   Niacin (VITAMIN B-3 PO) Take 1 tablet by mouth daily at 6 (six) AM.   Past Week   Potassium 99 MG TABS Take 99 mg by mouth daily.   Past Week   sildenafil (VIAGRA) 25 MG tablet Take 25 mg by mouth daily as needed.   09/11/2021   simvastatin (ZOCOR) 20 MG tablet Take 20 mg by mouth at bedtime.   Past Week   zinc gluconate 50 MG tablet Take 50 mg by mouth daily.   Past Week   HUMIRA PEN 40 MG/0.4ML PNKT Inject 40 mg into the skin every 14 (fourteen) days.   More than a month   Misc Natural Products (GLUCOSAMINE CHOND MSM FORMULA) TABS Take 1 tablet by mouth daily.   09/09/2021   Allergies:  Allergies  Allergen Reactions   Lyrica [Pregabalin] Other (See Comments)    "changes personality"    Family History  Problem Relation Age of Onset   Colon cancer Brother        70    Colon cancer Brother        22   Colon cancer Mother         age 22    Dementia Mother    Arthritis Mother    Heart attack Father    Colon cancer Cousin        27 years old onset   Colon cancer Cousin        56 years onset   Social History:  reports that he quit smoking about 16 years ago. His smoking use included cigars and cigarettes. He has a 15.00 pack-year smoking history. He has never used smokeless tobacco. He reports current alcohol use. He reports that he does not currently use drugs.  ROS: A complete review of systems was performed.  All systems are negative except for pertinent findings as noted. Review of Systems  All other systems reviewed and are negative.   Physical Exam:  Vital signs in last 24 hours: Temp:  [99 F (37.2 C)] 99 F (37.2 C) (12/30 1024) Pulse Rate:  [93] 93 (12/30 1024) Resp:  [16] 16 (12/30 1024) BP: (142)/(83) 142/83 (12/30 1024) SpO2:  [99 %] 99 % (12/30 1024) Weight:  [106.4 kg] 106.4 kg (12/30 1024) General:  Alert and oriented, No acute distress HEENT: Normocephalic, atraumatic Neck: No JVD or lymphadenopathy Cardiovascular: Regular rate and rhythm Lungs: Regular rate and effort Abdomen: Soft, nontender, nondistended, no abdominal masses Back: No CVA tenderness Extremities: No edema Neurologic: Grossly intact  Laboratory Data:  No results found for this or any previous visit (from the past 24 hour(s)). No results found for this or any previous visit (from the past 240 hour(s)). Creatinine: No results for input(s): CREATININE in the last 168 hours.  Impression/Assessment:  Frequency, dysuria, bladder erythema-  Plan:  I discussed with the patient the nature, potential benefits, risks and alternatives to cystoscopy, bladder biopsy, fulguration, including side effects of the proposed treatment, the likelihood of the patient achieving the goals of the procedure, and any potential problems that might occur during the procedure or recuperation.  We discussed this is more of a diagnostic test and  not a therapeutic test he may continue to have some frequency and  dysuria.  We need to rule out any malignant causes.  All questions answered. Patient elects to proceed.   Festus Aloe 09/12/2021, 12:04 PM

## 2021-09-12 NOTE — Op Note (Signed)
Preoperative diagnosis: Bladder erythema, frequency, dysuria Postoperative diagnosis: Same  Procedure: Cystoscopy with bladder biopsy and fulguration 0.5 to 2 cm, instillation of Pyridium and Marcaine  Surgeon: Junious Silk  Anesthesia: General  Indication for procedure: Mr. Craig Scott is a 65 year old male that has had bladder pain and gross hematuria consisting of red urine.  CT scan was benign but cystoscopy revealed some ulcerated erythematous areas in his bladder.  Urine cytology was negative.  He was brought for bladder biopsy.  Findings: On cystoscopy the urethra was unremarkable, prostate is mildly enlarged without obstruction.  No stone or foreign body in the bladder.  Ureteral orifice ease appeared normal and in orthotopic position.  He flux was clear.  The significant finding was along the posterior and dome of the bladder where there were about 6 areas of ulcerated and erythematous mucosa.  Up near the dome these areas had bled and there were small clots or necrotic tissue fixed to the ulcers.  For the 6 areas were biopsied and all of these areas were fulgurated.  There were no papillary lesions.  These lesions bled easily when the bladder was filled and they were stretched.  Description of procedure: After consent was obtained patient brought to the operating room.  After adequate anesthesia was placed lithotomy position and prepped and draped in the usual sterile fashion.  Timeout was performed to confirm the patient and procedure.  Cystoscope was passed per urethra and the bladder inspected with a 30 and 70 degree lens.  I then took the cold cup rigid biopsy forceps and biopsied the right bladder wall, posterior, left and dome.  These were good biopsies and representative of the findings.  The biopsy sites as well as all areas of erythema were fulgurated.  Hopefully this will alleviate some of his discomfort.  Hemostasis was excellent.  The bladder was drained and the scope removed.   Instillation of Pyridium and Marcaine was done.  Catheter removed.  Exam under anesthesia performed.  He is awakened taken to cover him in stable condition.  Complications: None  Blood loss: Minimal  Specimens to pathology: #1 right bladder biopsy #2 posterior #3 left #4 dome  Drains: None  Disposition: Patient stable to PACU-I discussed the procedure, postop care and follow-up with his wife.

## 2021-09-12 NOTE — Discharge Instructions (Signed)
°  Post Anesthesia Home Care Instructions  Activity: Get plenty of rest for the remainder of the day. A responsible individual must stay with you for 24 hours following the procedure.  For the next 24 hours, DO NOT: -Drive a car -Paediatric nurse -Drink alcoholic beverages -Take any medication unless instructed by your physician -Make any legal decisions or sign important papers.  Meals: Start with liquid foods such as gelatin or soup. Progress to regular foods as tolerated. Avoid greasy, spicy, heavy foods. If nausea and/or vomiting occur, drink only clear liquids until the nausea and/or vomiting subsides. Call your physician if vomiting continues.  Special Instructions/Symptoms: Your throat may feel dry or sore from the anesthesia or the breathing tube placed in your throat during surgery. If this causes discomfort, gargle with warm salt water. The discomfort should disappear within 24 hours.  No tylenol/acetaminophen until after 4:30 pm today if needed.

## 2021-09-12 NOTE — Anesthesia Postprocedure Evaluation (Signed)
Anesthesia Post Note  Patient: Craig Scott  Procedure(s) Performed: CYSTOSCOPY WITH BLADDERBIOPSY/ FULGURATION/ INSTILLATION OF MARCAINE/PYRIDIUM     Patient location during evaluation: PACU Anesthesia Type: General Level of consciousness: awake and alert Pain management: pain level controlled Vital Signs Assessment: post-procedure vital signs reviewed and stable Respiratory status: spontaneous breathing, nonlabored ventilation, respiratory function stable and patient connected to nasal cannula oxygen Cardiovascular status: blood pressure returned to baseline and stable Postop Assessment: no apparent nausea or vomiting Anesthetic complications: no   No notable events documented.  Last Vitals:  Vitals:   09/12/21 1345 09/12/21 1400  BP: (!) 153/92 (!) 144/76  Pulse: 81 74  Resp: 18 16  Temp:  36.6 C  SpO2: 97% 98%    Last Pain:  Vitals:   09/12/21 1400  TempSrc:   PainSc: 0-No pain                 Barnet Glasgow

## 2021-09-12 NOTE — Anesthesia Procedure Notes (Signed)
Procedure Name: LMA Insertion Date/Time: 09/12/2021 12:39 PM Performed by: Lieutenant Diego, CRNA Pre-anesthesia Checklist: Patient identified, Emergency Drugs available, Suction available and Patient being monitored Patient Re-evaluated:Patient Re-evaluated prior to induction Oxygen Delivery Method: Circle system utilized Preoxygenation: Pre-oxygenation with 100% oxygen Induction Type: IV induction Ventilation: Mask ventilation without difficulty LMA: LMA inserted LMA Size: 5.0 Number of attempts: 1 Placement Confirmation: positive ETCO2 and breath sounds checked- equal and bilateral Tube secured with: Tape Dental Injury: Teeth and Oropharynx as per pre-operative assessment

## 2021-09-16 ENCOUNTER — Encounter (HOSPITAL_BASED_OUTPATIENT_CLINIC_OR_DEPARTMENT_OTHER): Payer: Self-pay | Admitting: Urology

## 2021-09-16 LAB — SURGICAL PATHOLOGY

## 2021-09-17 NOTE — Op Note (Signed)
[facility] Patient Name: Craig Scott Procedure Date: 09/04/2021 12:55 PM MRN: [id number] Date of Birth: [birth year] Attending MD: Norvel Richards , MD CSN: [id number] Age: 66 Admit Type: Outpatient Procedure:                Upper GI endoscopy Indications:              Hereditary nonpolyposis colorectal cancer (Lynch                            Syndrome) Providers:                Norvel Richards, MD, Caprice Kluver, Kristine L.                            Risa Grill, Technician Referring MD:              Medicines:                Propofol per Anesthesia Complications:            No immediate complications. Estimated Blood Loss:     Estimated blood loss was minimal. Procedure:                Pre-Anesthesia Assessment:                           - Prior to the procedure, a History and Physical                            was performed, and patient medications and                            allergies were reviewed. The patient's tolerance of                            previous anesthesia was also reviewed. The risks                            and benefits of the procedure and the sedation                            options and risks were discussed with the patient.                            All questions were answered, and informed consent                            was obtained. Prior Anticoagulants: The patient has                            taken no previous anticoagulant or antiplatelet                            agents. ASA Grade Assessment: III - A patient with  severe systemic disease. After reviewing the risks                            and benefits, the patient was deemed in                            satisfactory condition to undergo the procedure.                           After obtaining informed consent, the endoscope was                            passed under direct vision. Throughout the                            procedure, the  patient's blood pressure, pulse, and                            oxygen saturations were monitored continuously. The                            [id number]-H190 ([id number]) scope was introduced through the                            mouth, and advanced to the second part of duodenum.                            The upper GI endoscopy was accomplished without                            difficulty. The patient tolerated the procedure                            well. Scope In: 1:47:43 PM Scope Out: 1:58:52 PM Total Procedure Duration: 0 hours 11 minutes 9 seconds  Findings:      The examined esophagus was normal. Inlet patch present. Duodenal bulbar       erosions. Small hiatal hernia. Gastric mucosa appeared normal. Acute       angle between the first and second portion of the duodenum made it       difficult to negotiate with the gastroscope. I was not able to identify       the duodenum. The [employer] rep was present with the exact       scope. Because the angulation he did not recommend attempting to pass       the duodenoscope to image the ampulla. Consequently, this was not       attempted. Biopsies of the gastric mucosa taken for histologic study Impression:               - Normal esophagus (inlet paych). Small hiatal                            hernia. duodenal erosions. Status post gastric  biopsy Moderate Sedation:      Moderate (conscious) sedation was personally administered by an       anesthesia professional. The following parameters were monitored: oxygen       saturation, heart rate, blood pressure, respiratory rate, EKG, adequacy       of pulmonary ventilation, and response to care. Recommendation:           - Patient has a contact number available for                            emergencies. The signs and symptoms of potential                            delayed complications were discussed with the                            patient. Return to normal  activities tomorrow.                            Written discharge instructions were provided to the                            patient.                           - Advance diet as tolerated. Follow-up on                            pathology. See colonoscopy report. Procedure Code(s):        --- Professional ---                           [phone], Esophagogastroduodenoscopy, flexible,                            transoral; diagnostic, including collection of                            specimen(s) by brushing or washing, when performed                            (separate procedure) Diagnosis Code(s):        --- Professional ---                           C18.9, Malignant neoplasm of colon, unspecified                           Z15.09, Genetic susceptibility to other malignant                            neoplasm CPT copyright 2019 American Medical Association. All rights reserved. The codes documented in this report are preliminary and upon coder review may  be revised to meet current compliance requirements. [name]. Jette Lewan, MD Norvel Richards, MD 09/17/2021 11:53:07 AM This report has been signed electronically. Number of Addenda: 0

## 2021-09-18 ENCOUNTER — Other Ambulatory Visit: Payer: Self-pay

## 2021-09-18 ENCOUNTER — Other Ambulatory Visit: Payer: BC Managed Care – PPO

## 2021-09-18 ENCOUNTER — Telehealth: Payer: Self-pay

## 2021-09-18 DIAGNOSIS — R350 Frequency of micturition: Secondary | ICD-10-CM

## 2021-09-18 LAB — MICROSCOPIC EXAMINATION
Bacteria, UA: NONE SEEN
RBC, Urine: >30 /hpf — AB (ref 0–2)
Renal Epithel, UA: NONE SEEN /hpf
WBC, UA: NONE SEEN /hpf (ref 0–5)

## 2021-09-18 LAB — URINALYSIS, ROUTINE W REFLEX MICROSCOPIC
Bilirubin, UA: NEGATIVE
Glucose, UA: NEGATIVE
Ketones, UA: NEGATIVE
Leukocytes,UA: NEGATIVE
Nitrite, UA: NEGATIVE
Specific Gravity, UA: 1.01 (ref 1.005–1.030)
Urobilinogen, Ur: 0.2 mg/dL (ref 0.2–1.0)
pH, UA: 6 (ref 5.0–7.5)

## 2021-09-18 NOTE — Telephone Encounter (Signed)
Patient came by office today to report new onset hematuria with burning at times.  Patient had TURBT on 09/12/2021. Encourage patient to increase fluid intake and that hematuria after surgery is not uncommon but we would do a UA/UC to rule out infection post op.  Patient voiced understanding and will keep post op appt to see Dr. Junious Silk on 09/29/2021

## 2021-09-20 LAB — URINE CULTURE: Organism ID, Bacteria: NO GROWTH

## 2021-09-22 ENCOUNTER — Encounter (HOSPITAL_COMMUNITY): Payer: Self-pay | Admitting: Internal Medicine

## 2021-09-22 ENCOUNTER — Telehealth: Payer: Self-pay

## 2021-09-22 NOTE — Telephone Encounter (Signed)
-----   Message from Festus Aloe, MD sent at 09/22/2021 10:56 AM EST ----- Let him know his urine culture was negative. Also his bladder biopsies were benign  - no cancer. He can take some OTC Azo or I can send in Uribel as well.   ----- Message ----- From: Iris Pert, LPN Sent: 10/22/9789   8:57 AM EST To: Festus Aloe, MD  Please review

## 2021-09-22 NOTE — Telephone Encounter (Signed)
Patient called and made aware.

## 2021-09-29 ENCOUNTER — Encounter: Payer: Self-pay | Admitting: Urology

## 2021-09-29 ENCOUNTER — Ambulatory Visit (INDEPENDENT_AMBULATORY_CARE_PROVIDER_SITE_OTHER): Payer: BC Managed Care – PPO | Admitting: Urology

## 2021-09-29 ENCOUNTER — Other Ambulatory Visit: Payer: Self-pay

## 2021-09-29 VITALS — BP 140/84 | HR 84 | Wt 234.0 lb

## 2021-09-29 DIAGNOSIS — R350 Frequency of micturition: Secondary | ICD-10-CM | POA: Diagnosis not present

## 2021-09-29 DIAGNOSIS — N3020 Other chronic cystitis without hematuria: Secondary | ICD-10-CM

## 2021-09-29 DIAGNOSIS — R310 Gross hematuria: Secondary | ICD-10-CM

## 2021-09-29 LAB — MICROSCOPIC EXAMINATION
Epithelial Cells (non renal): NONE SEEN /hpf (ref 0–10)
RBC, Urine: >30 /hpf — AB (ref 0–2)
Renal Epithel, UA: NONE SEEN /hpf

## 2021-09-29 LAB — URINALYSIS, ROUTINE W REFLEX MICROSCOPIC
Bilirubin, UA: NEGATIVE
Glucose, UA: NEGATIVE
Ketones, UA: NEGATIVE
Nitrite, UA: NEGATIVE
Specific Gravity, UA: 1.025 (ref 1.005–1.030)
Urobilinogen, Ur: 0.2 mg/dL (ref 0.2–1.0)
pH, UA: 6.5 (ref 5.0–7.5)

## 2021-09-29 NOTE — Progress Notes (Signed)
09/29/2021 11:22 AM   Craig Scott July 11, 1956 [id number]  Referring provider: Redmond School, MD [address] [place],  [place] [address]  No chief complaint on file.   HPI:  F/u -    1) cystitis/gross hematuria - for past few weeks he has noted red urine. Microscopic hematuria-noted on UA Oct 2022 - greater than 30 red blood cells.  No bacteria. He does have Lynch syndrome. Remote smoker.  Office cystoscopy November 2022 revealed erythematous and ulcerated areas in his bladder.  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  Biopsy: December 2022- bladder bx - follicular cystitis, Benign urothelium and submucosa with prominent chronic inflammation, ulceration and telangiectasia    Imaging: Jan 2020 CT scan a/p reported with a 9 mm left renal lesion that needed further evaluation.  Aug 2022 - abd [place] [facility] reported as normal and 11 mm left renal cyst.  Oct 2022 CT a/p - benign. 9 mm left renal lesion/cyst stable.    2) BPH - patient with frequency. He has a good stream. He has not had prostate surgery and on no meds. PVR 49 ml.  October 2022 PSA 0.5.  Normal DRE December 2022. He complains of urinary frequency.  He does drink "a lot of beer and water".  AUASS 7 - better p bladder bx, fulguration.   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    Today, seen for the above. Fortunately, his LUTS have improved. He had one episode of hematuria and clots last week. This cleared.   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 16 yrs ago   Tubular adenoma    Urinary frequency    Wears glasses     Surgical History: Past Surgical History:  Procedure Laterality Date   BASAL CELL CARCINOMA EXCISION     BIOPSY  09/04/2021   Procedure: BIOPSY;  Surgeon: Daneil Dolin, MD;  [facility];  Service: Endoscopy;;   BUNIONECTOMY WITH HAMMERTOE RECONSTRUCTION AND GASTROC SLIDE Right 06/27/2015   Procedure: RIGHT GASTROC RECESSION/POSTERIOR TIBIAL TENOLYSIS/SECOND AND THIRD METATARSAL WEIL OSTEOTOMY AND HAMMERTOE CORRECTION;  Surgeon: Wylene Simmer, MD;  Location: [place];  Service: Orthopedics;  Laterality: Right;   CALCANEAL OSTEOTOMY Right 06/27/2015   Procedure: CALCANEAL OSTEOTOMY;  Surgeon: Wylene Simmer, MD;  Location: [place];  Service: Orthopedics;  Laterality: Right;   COLONOSCOPY  12/09/2001   RMR: Normal normal rectum/ A few scattered left-sided diverticula.  The remainder of the colonic  mucosa appeared normal   COLONOSCOPY  12/15/2002   RMR: Normal rectum, scattered left-sided diverticula.  The remainder of the colonic mucosa appeared normal   COLONOSCOPY  04/10/2004   RMR: Normal rectum/Sigmoid diverticula/The remainder of the colonic mucosa appeared  normal   COLONOSCOPY  12/23/2005   RMR: Normal rectum, scattered sigmoid diverticula Colonic mucosa appeared normal   COLONOSCOPY  01/20/2008   RMR: Distal diminutive rectal polyps, status post cold biopsy removal  otherwise, normal rectum/ biopsy removal; scattered sigmoid diverticula; and benign-appearing/ulcers about the ileocecal valve, status post biopsy.  Remainder of colonic mucosa and terminal mucosa appeared normal.   COLONOSCOPY  05/22/2010   RMR: distal diminutive rectal polyp s/p bx otherwise normal/few scattered pancolonic diverticula   COLONOSCOPY  09/16/2012   Dr.Rourk- normal  rectum, few scattered pancolonic diverticulosis, one diminutive polyp in the base of the cecum. 3x3 area of hyper pigmentation i the mid descending segment, this was a flat benign-appearing area o/w the remainder of the colonic mucosa appeared normal. bx= tubular adenoma and benign colonic mucosa   COLONOSCOPY WITH PROPOFOL N/A 09/05/2015   Procedure: COLONOSCOPY WITH PROPOFOL;  Surgeon: Daneil Dolin, MD;  [facility];  Service: Endoscopy;  Laterality: N/A;  0830   COLONOSCOPY WITH PROPOFOL N/A 05/24/2017   Procedure: COLONOSCOPY WITH PROPOFOL;  Surgeon: Daneil Dolin, MD;  [facility];  Service: Endoscopy;  Laterality: N/A;   COLONOSCOPY WITH PROPOFOL N/A 09/04/2021   Procedure: COLONOSCOPY WITH PROPOFOL;  Surgeon: Daneil Dolin, MD;  [facility];  Service: Endoscopy;  Laterality: N/A;  1:15 / ASA III-DO NOT MOVE. Rep is coming for EXALT ERCP scope.   colonscopy  09/04/2021   @ [facility]   CYSTOSCOPY WITH BIOPSY N/A 09/12/2021   Procedure: CYSTOSCOPY WITH BLADDERBIOPSY/ FULGURATION/ INSTILLATION OF MARCAINE/PYRIDIUM;  Surgeon: Festus Aloe, MD;  Location[facility];  Service: Urology;  Laterality: N/A;   ESOPHAGOGASTRODUODENOSCOPY  12/23/2005   RMR:   Esophagogastric peptic stricture with reflux esophagitis, status post  dilation as described above/ Small hiatal hernia, otherwise normal stomach.  Bulbar erosions otherwise normal D1 and D2   ESOPHAGOGASTRODUODENOSCOPY (EGD) WITH PROPOFOL  09/04/2021   Procedure: ESOPHAGOGASTRODUODENOSCOPY (EGD) WITH PROPOFOL;  Surgeon: Daneil Dolin, MD;  [facility];  Service: Endoscopy;;   EYE SURGERY  as child   FOOT ARTHRODESIS Right 06/17/2017   Procedure: Right Talonavicular and Subtalar Arthrodesis;  Surgeon: Wylene Simmer, MD;  Location: [place];  Service: Orthopedics;  Laterality: Right;   HARDWARE REMOVAL Right 06/25/2016   Procedure: REMOVAL OF DEEP IMPLANTS  RIGHT MEDIAL CUNEIFORM, RIGHT CALCANEAL REMOVAL OF DEEP IMPLANTS;  Surgeon: Wylene Simmer, MD;  Location: [facility];  Service: Orthopedics;  Laterality: Right;   JOINT REPLACEMENT  09/30/2010   Right knee   KNEE SURGERY Right    bilat    NOSE SURGERY Left    cartiledge removal   POLYPECTOMY  09/05/2015   Procedure: POLYPECTOMY;  Surgeon: Daneil Dolin, MD;  [facility];  Service: Endoscopy;;   POLYPECTOMY  05/24/2017   Procedure: POLYPECTOMY;  Surgeon: Daneil Dolin, MD;  [facility];  Service: Endoscopy;;  colon   POLYPECTOMY  09/04/2021   Procedure: POLYPECTOMY;  Surgeon: Daneil Dolin, MD;  [facility];  Service: Endoscopy;;   RHINOPLASTY     TOTAL HIP ARTHROPLASTY Left 03/20/2015   Procedure: LEFT TOTAL HIP ARTHROPLASTY ANTERIOR APPROACH;  Surgeon: Gaynelle Arabian, MD;  Location: [facility];  Service: Orthopedics;  Laterality: Left;   TOTAL KNEE ARTHROPLASTY Left 10/23/2013   Procedure: LEFT TOTAL KNEE ARTHROPLASTY;  Surgeon: [name], MD;  Location: [facility];  Service: Orthopedics;  Laterality: Left;  TYMPANOSTOMY TUBE PLACEMENT Right    early 2014 and multiple times   TYMPANOSTOMY TUBE PLACEMENT      Home Medications:  Allergies as of 09/29/2021       Reactions   Lyrica [pregabalin] Other (See Comments)   "changes personality"        Medication List        Accurate as of September 29, 2021 11:22 AM. If you have any questions, ask your nurse or doctor.          ALPRAZolam 1 MG tablet Commonly known as: XANAX Take 1 mg by mouth 2 (two) times daily. Anxiety can take up to 2 extra prn   aspirin EC 81 MG tablet Take 1 tablet (81 mg total) by mouth 2 (two) times daily. What changed: when to take this   celecoxib 200 MG capsule Commonly known as: CELEBREX Take 200 mg by mouth at bedtime.   cetirizine 10 MG tablet Commonly known as: ZYRTEC Take 10 mg by mouth daily.   Glucosamine Chond MSM Formula Tabs Take  1 tablet by mouth daily.   Humira Pen 40 MG/0.4ML Pnkt Generic drug: Adalimumab Inject 40 mg into the skin every 14 (fourteen) days.   HYDROcodone-acetaminophen 7.5-325 MG tablet Commonly known as: NORCO Take 1 tablet by mouth every 6 (six) hours as needed for moderate pain.   hydroxychloroquine 200 MG tablet Commonly known as: PLAQUENIL Take 400 mg by mouth daily.   melatonin 3 MG Tabs tablet Take 3 mg by mouth at bedtime.   multivitamin with minerals Tabs tablet Take 1 tablet by mouth daily.   Potassium 99 MG Tabs Take 99 mg by mouth daily.   Prevagen 10 MG Caps Generic drug: Apoaequorin Take 10 mg by mouth daily.   sildenafil 25 MG tablet Commonly known as: VIAGRA Take 25 mg by mouth daily as needed.   simvastatin 20 MG tablet Commonly known as: ZOCOR Take 20 mg by mouth at bedtime.   VITAMIN B-3 PO Take 1 tablet by mouth daily at 6 (six) AM.   zinc gluconate 50 MG tablet Take 50 mg by mouth daily.        Allergies:  Allergies  Allergen Reactions   Lyrica [Pregabalin] Other (See Comments)    "changes personality"    Family History: Family History  Problem Relation Age of Onset   Colon cancer Brother        42    Colon cancer Brother        78   Colon cancer Mother        age 29    Dementia Mother    Arthritis Mother    Heart attack Father    Colon cancer Cousin        15 years old onset   Colon cancer Cousin        76 years onset    Social History:  reports that he quit smoking about 16 years ago. His smoking use included cigars and cigarettes. He has a 15.00 pack-year smoking history. He has never used smokeless tobacco. He reports current alcohol use. He reports that he does not currently use drugs.   Physical Exam: BP 140/84    Pulse 84    Wt 234 lb (106.1 kg)    BMI 32.64 kg/m   Constitutional:  Alert and oriented, No acute distress. HEENT: [place] AT, moist mucus membranes.  Trachea midline, no masses. Cardiovascular: No clubbing,  cyanosis, or edema. Respiratory: Normal respiratory effort, no increased work  of breathing. GI: Abdomen is soft, nontender, nondistended, no abdominal masses Skin: No rashes, bruises or suspicious lesions. Neurologic: Grossly intact, no focal deficits, moving all 4 extremities. Psychiatric: Normal mood and affect.  Laboratory Data: Lab Results  Component Value Date   WBC 9.5 09/14/2018   HGB 15.6 09/14/2018   HCT 46.0 09/14/2018   MCV 90.7 09/14/2018   PLT 256 09/14/2018    Lab Results  Component Value Date   CREATININE 0.70 09/02/2021    No results found for: PSA  No results found for: TESTOSTERONE  No results found for: HGBA1C  Urinalysis    Component Value Date/Time   COLORURINE STRAW (A) 09/14/2018 1900   APPEARANCEUR Clear 09/18/2021 1718   LABSPEC 1.008 09/14/2018 1900   PHURINE 5.0 09/14/2018 1900   GLUCOSEU Negative 09/18/2021 1718   HGBUR MODERATE (A) 09/14/2018 1900   BILIRUBINUR Negative 09/18/2021 1718   KETONESUR NEGATIVE 09/14/2018 1900   PROTEINUR 2+ (A) 09/18/2021 1718   PROTEINUR NEGATIVE 09/14/2018 1900   UROBILINOGEN 1.0 03/15/2015 1508   NITRITE Negative 09/18/2021 1718   NITRITE NEGATIVE 09/14/2018 1900   LEUKOCYTESUR Negative 09/18/2021 1718    Lab Results  Component Value Date   LABMICR See below: 09/18/2021   WBCUA None seen 09/18/2021   LABEPIT 0-10 09/18/2021   MUCUS Present 09/18/2021   BACTERIA None seen 09/18/2021    Pertinent Imaging:    Assessment & Plan:    1. Urine frequency Improved  - Urinalysis, Routine w reflex microscopic  2. Gross hematuria Benign evaluation  - Urinalysis, Routine w reflex microscopic  3. Cystitis - benign eval. Will monitor. Reviewed path.   No follow-ups on file.  Festus Aloe, MD  [facility]  [address] [place], [place] [address] [phone]

## 2021-09-29 NOTE — Progress Notes (Signed)
Urological Symptom Review  Patient is experiencing the following symptoms: Get up at night to urinate Blood in urine   Review of Systems  Gastrointestinal (upper)  : Negative for upper GI symptoms  Gastrointestinal (lower) : Negative for lower GI symptoms  Constitutional : Negative for symptoms  Skin: Negative for skin symptoms  Eyes: Negative for eye symptoms  Ear/Nose/Throat : Negative for Ear/Nose/Throat symptoms  Hematologic/Lymphatic: Negative for Hematologic/Lymphatic symptoms  Cardiovascular : Negative for cardiovascular symptoms  Respiratory : Negative for respiratory symptoms  Endocrine: Negative for endocrine symptoms  Musculoskeletal: Negative for musculoskeletal symptoms  Neurological: Negative for neurological symptoms  Psychologic: Negative for psychiatric symptoms

## 2022-03-16 ENCOUNTER — Ambulatory Visit
Admission: EM | Admit: 2022-03-16 | Discharge: 2022-03-16 | Disposition: A | Discharge status: Home / Self Care | Payer: Medicare PPO | Attending: Nurse Practitioner | Admitting: Nurse Practitioner

## 2022-03-16 DIAGNOSIS — R5383 Other fatigue: Secondary | ICD-10-CM

## 2022-03-16 DIAGNOSIS — W57XXXA Bitten or stung by nonvenomous insect and other nonvenomous arthropods, initial encounter: Secondary | ICD-10-CM

## 2022-03-16 NOTE — Discharge Instructions (Signed)
-   We will let you if any of the tickborne illness testing comes back positive - Please use insect repellent when outside -Follow-up with your primary care provider for a checkup when you are able to in the next couple of weeks

## 2022-03-16 NOTE — ED Triage Notes (Signed)
Pt reports feeling weak x 2 days. Reports he reports he removed over 14 ticks weeks over 2-3 weeks. Reports all the bites have some white area around.

## 2022-03-16 NOTE — ED Provider Notes (Signed)
[facility]    CSN: [id number] Arrival date & time: 03/16/22  1214      History   Chief Complaint Chief Complaint  Patient presents with   Fatigue    HPI Craig Scott is a 66 y.o. male.   Patient presents with 2 to 3 days of fatigue.  Reports history of multiple tick bites in the past couple of weeks.  Reports he "dug" them out with his nail.  He denies fevers, new body aches, new muscle pain, chills, new headache, or rash around the tick bites.  Patient does report a history of depression/anxiety, chronic pain secondary to rheumatoid arthritis.  Also reports a history of alcoholism.  Reports he has been drinking more alcohol lately since he has retired.  His wife recently told him "its time for a T shot" but he has not been able to make an appointment with his primary care provider yet.     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 16 yrs ago   Tubular adenoma    Urinary frequency    Wears glasses     Patient Active Problem List   Diagnosis Date Noted   Polyarthralgia 04/20/2018   Status post total knee replacement, bilateral 04/20/2018   Status post total hip replacement, left 04/20/2018   Primary osteoarthritis of both feet 04/20/2018   Chronic pain syndrome 04/20/2018   Primary insomnia 04/20/2018   History of anxiety 04/20/2018   History of colonic polyps    Flat foot 06/27/2015   OA (osteoarthritis) of hip 03/20/2015   Hyponatremia 10/24/2013   OA (osteoarthritis) of knee 10/23/2013   Hepatomegaly  09/01/2012   Lynch syndrome 09/01/2012   GERD (gastroesophageal reflux disease) 09/01/2012   Knee pain 08/12/2011    Past Surgical History:  Procedure Laterality Date   BASAL CELL CARCINOMA EXCISION     BIOPSY  09/04/2021   Procedure: BIOPSY;  Surgeon: Daneil Dolin, MD;  [facility];  Service: Endoscopy;;   BUNIONECTOMY WITH HAMMERTOE RECONSTRUCTION AND GASTROC SLIDE Right 06/27/2015   Procedure: RIGHT GASTROC RECESSION/POSTERIOR TIBIAL TENOLYSIS/SECOND AND THIRD METATARSAL WEIL OSTEOTOMY AND HAMMERTOE CORRECTION;  Surgeon: Wylene Simmer, MD;  Location: [place];  Service: Orthopedics;  Laterality: Right;   CALCANEAL OSTEOTOMY Right 06/27/2015   Procedure: CALCANEAL OSTEOTOMY;  Surgeon: Wylene Simmer, MD;  Location: [place];  Service: Orthopedics;  Laterality: Right;   COLONOSCOPY  12/09/2001   RMR: Normal normal rectum/ A few scattered left-sided diverticula.  The remainder of the colonic  mucosa appeared normal   COLONOSCOPY  12/15/2002   RMR: Normal rectum, scattered left-sided diverticula.  The remainder of the colonic mucosa appeared normal   COLONOSCOPY  04/10/2004   RMR: Normal rectum/Sigmoid diverticula/The remainder of the colonic mucosa appeared normal   COLONOSCOPY  12/23/2005   RMR: Normal rectum, scattered sigmoid diverticula Colonic  mucosa appeared normal   COLONOSCOPY  01/20/2008   RMR: Distal diminutive rectal polyps, status post cold biopsy removal  otherwise, normal rectum/ biopsy removal; scattered sigmoid diverticula; and benign-appearing/ulcers about the ileocecal valve, status post biopsy.  Remainder of colonic mucosa and terminal mucosa appeared normal.   COLONOSCOPY  05/22/2010   RMR: distal diminutive rectal polyp s/p bx otherwise normal/few scattered pancolonic diverticula   COLONOSCOPY  09/16/2012   Dr.Rourk- normal rectum, few scattered pancolonic diverticulosis, one diminutive polyp in the base of the cecum. 3x3  area of hyper pigmentation i the mid descending segment, this was a flat benign-appearing area o/w the remainder of the colonic mucosa appeared normal. bx= tubular adenoma and benign colonic mucosa   COLONOSCOPY WITH PROPOFOL N/A 09/05/2015   Procedure: COLONOSCOPY WITH PROPOFOL;  Surgeon: Daneil Dolin, MD;  [facility];  Service: Endoscopy;  Laterality: N/A;  0830   COLONOSCOPY WITH PROPOFOL N/A 05/24/2017   Procedure: COLONOSCOPY WITH PROPOFOL;  Surgeon: Daneil Dolin, MD;  [facility];  Service: Endoscopy;  Laterality: N/A;   COLONOSCOPY WITH PROPOFOL N/A 09/04/2021   Procedure: COLONOSCOPY WITH PROPOFOL;  Surgeon: Daneil Dolin, MD;  [facility];  Service: Endoscopy;  Laterality: N/A;  1:15 / ASA III-DO NOT MOVE. Rep is coming for EXALT ERCP scope.   colonscopy  09/04/2021   @ [place]   CYSTOSCOPY WITH BIOPSY N/A 09/12/2021   Procedure: CYSTOSCOPY WITH BLADDERBIOPSY/ FULGURATION/ INSTILLATION OF MARCAINE/PYRIDIUM;  Surgeon: Festus Aloe, MD;  Location: [facility];  Service: Urology;  Laterality: N/A;   ESOPHAGOGASTRODUODENOSCOPY  12/23/2005   RMR:   Esophagogastric peptic stricture with reflux esophagitis, status post  dilation as described above/ Small hiatal hernia, otherwise normal stomach.  Bulbar erosions otherwise normal D1 and D2   ESOPHAGOGASTRODUODENOSCOPY (EGD) WITH PROPOFOL  09/04/2021   Procedure: ESOPHAGOGASTRODUODENOSCOPY (EGD) WITH PROPOFOL;  Surgeon: Daneil Dolin, MD;  [facility];  Service: Endoscopy;;   EYE SURGERY  as child   FOOT ARTHRODESIS Right 06/17/2017   Procedure: Right Talonavicular and Subtalar Arthrodesis;  Surgeon: Wylene Simmer, MD;  Location: [place];  Service: Orthopedics;  Laterality: Right;   HARDWARE REMOVAL Right 06/25/2016   Procedure: REMOVAL OF DEEP IMPLANTS RIGHT MEDIAL CUNEIFORM, RIGHT CALCANEAL REMOVAL OF DEEP IMPLANTS;  Surgeon: Wylene Simmer, MD;   Location: [place];  Service: Orthopedics;  Laterality: Right;   JOINT REPLACEMENT  09/30/2010   Right knee   KNEE SURGERY Right    bilat    NOSE SURGERY Left    cartiledge removal   POLYPECTOMY  09/05/2015   Procedure: POLYPECTOMY;  Surgeon: Daneil Dolin, MD;  [facility];  Service: Endoscopy;;   POLYPECTOMY  05/24/2017   Procedure: POLYPECTOMY;  Surgeon: Daneil Dolin, MD;  [facility];  Service: Endoscopy;;  colon   POLYPECTOMY  09/04/2021   Procedure: POLYPECTOMY;  Surgeon: Daneil Dolin, MD;  [facility];  Service: Endoscopy;;   RHINOPLASTY     TOTAL HIP ARTHROPLASTY Left 03/20/2015   Procedure: LEFT TOTAL HIP ARTHROPLASTY ANTERIOR APPROACH;  Surgeon: Gaynelle Arabian, MD;  Location: [facility];  Service: Orthopedics;  Laterality: Left;   TOTAL KNEE ARTHROPLASTY Left 10/23/2013   Procedure: LEFT TOTAL KNEE ARTHROPLASTY;  Surgeon: [name], MD;  Location: [facility];  Service: Orthopedics;  Laterality: Left;   TYMPANOSTOMY TUBE PLACEMENT Right    early 2014 and multiple times  TYMPANOSTOMY TUBE PLACEMENT         Home Medications    Prior to Admission medications   Medication Sig Start Date End Date Taking? Authorizing Provider  ALPRAZolam Duanne Moron) 1 MG tablet Take 1 mg by mouth 2 (two) times daily. Anxiety can take up to 2 extra prn    [provider]  Apoaequorin (PREVAGEN) 10 MG CAPS Take 10 mg by mouth daily.    [provider]  aspirin EC 81 MG tablet Take 1 tablet (81 mg total) by mouth 2 (two) times daily. Patient taking differently: Take 81 mg by mouth. 06/17/17   Corky Sing, PA-C  celecoxib (CELEBREX) 200 MG capsule Take 200 mg by mouth at bedtime.    [provider]  cetirizine (ZYRTEC) 10 MG tablet Take 10 mg by mouth daily.    [provider]  HUMIRA PEN 40 MG/0.4ML PNKT Inject 40 mg into the skin every 14 (fourteen) days. 06/06/21   [provider]   HYDROcodone-acetaminophen (NORCO) 7.5-325 MG tablet Take 1 tablet by mouth every 6 (six) hours as needed for moderate pain. 09/12/21   Festus Aloe, MD  hydroxychloroquine (PLAQUENIL) 200 MG tablet Take 400 mg by mouth daily. 07/04/21   [provider]  Melatonin 3 MG TABS Take 3 mg by mouth at bedtime.    [provider]  Misc Natural Products (GLUCOSAMINE CHOND MSM FORMULA) TABS Take 1 tablet by mouth daily.    [provider]  Multiple Vitamin (MULTIVITAMIN WITH MINERALS) TABS tablet Take 1 tablet by mouth daily.    [provider]  Niacin (VITAMIN B-3 PO) Take 1 tablet by mouth daily at 6 (six) AM.    [provider]  Potassium 99 MG TABS Take 99 mg by mouth daily.    [provider]  sildenafil (VIAGRA) 25 MG tablet Take 25 mg by mouth daily as needed. 07/04/21   [provider]  simvastatin (ZOCOR) 20 MG tablet Take 20 mg by mouth at bedtime.    [provider]  zinc gluconate 50 MG tablet Take 50 mg by mouth daily.    [provider]    Family History Family History  Problem Relation Age of Onset   Colon cancer Brother        18    Colon cancer Brother        54   Colon cancer Mother        age 31    Dementia Mother    Arthritis Mother    Heart attack Father    Colon cancer Cousin        72 years old onset   Colon cancer Cousin        43 years onset    Social History Social History   Tobacco Use   Smoking status: Former    Packs/day: 0.50    Years: 30.00    Total pack years: 15.00    Types: Cigars, Cigarettes    Quit date: 09/14/2005    Years since quitting: 16.5   Smokeless tobacco: Never  Vaping Use   Vaping Use: Never used  Substance Use Topics   Alcohol use: Yes    Comment: 8-9 beers/day; some days drinks more   Drug use: Not Currently    Comment: no marijuana use in 25 yrs per pt on 09-09-2021     Allergies   Lyrica [pregabalin]   Review of Systems Review of  Systems Per HPI  Physical Exam Triage  Vital Signs ED Triage Vitals  Enc Vitals Group     BP 03/16/22 1226 118/78     Pulse Rate 03/16/22 1226 82     Resp 03/16/22 1226 18     Temp 03/16/22 1226 98 F (36.7 C)     Temp Source 03/16/22 1226 Oral     SpO2 03/16/22 1226 98 %     Weight --      Height --      Head Circumference --      Peak Flow --      Pain Score 03/16/22 1229 0     Pain Loc --      Pain Edu? --      Excl. in [place]? --    No data found.  Updated Vital Signs BP 118/78 (BP Location: Right Arm)   Pulse 82   Temp 98 F (36.7 C) (Oral)   Resp 18   SpO2 98%   Visual Acuity Right Eye Distance:   Left Eye Distance:   Bilateral Distance:    Right Eye Near:   Left Eye Near:    Bilateral Near:     Physical Exam Vitals and nursing note reviewed.  Constitutional:      General: He is not in acute distress.    Appearance: Normal appearance. He is not toxic-appearing.  Cardiovascular:     Rate and Rhythm: Normal rate and regular rhythm.  Pulmonary:     Effort: Pulmonary effort is normal. No respiratory distress.     Breath sounds: No wheezing, rhonchi or rales.  Musculoskeletal:     Cervical back: Normal range of motion.  Lymphadenopathy:     Cervical: No cervical adenopathy.  Skin:    General: Skin is warm and dry.     Capillary Refill: Capillary refill takes less than 2 seconds.     Findings: Abrasion present.     Comments: Multiple scabbed over abrasions-right hip, pannus, left foot, multiple areas on the back.  No bull's-eye rash appreciated, no surrounding erythema, fluctuance, warmth, or drainage noted from any of the areas.  Neurological:     Mental Status: He is alert and oriented to person, place, and time.  Psychiatric:        Behavior: Behavior is agitated.      UC Treatments / Results  Labs (all labs ordered are listed, but only abnormal results are displayed) Labs Reviewed  LYME DISEASE SEROLOGY W/REFLEX  ROCKY MTN SPOTTED FVR ABS  PNL(IGG+IGM)    EKG   Radiology No results found.  Procedures Procedures (including critical care time)  Medications Ordered in UC Medications - No data to display  Initial Impression / Assessment and Plan / UC Course  I have reviewed the triage vital signs and the nursing notes.  Pertinent labs & imaging results that were available during my care of the patient were reviewed by me and considered in my medical decision making (see chart for details).    Patient is a 66 year old male presenting for fatigue.  He is concerned about tickborne illness given multiple tick bites in the past couple weeks.  We will check [facility] spotted fever titers, Lyme disease titers and treat if they come back positive.  We discussed that his symptoms may be related to other causes including acute viral illness, exacerbation of any chronic illness including rheumatoid arthritis, depression/anxiety.  I encouraged close follow-up with primary care provider within the next 1 to 2 weeks if symptoms or not improved or if they  worsen. Final Clinical Impressions(s) / UC Diagnoses   Final diagnoses:  Fatigue, unspecified type  Tick bite, unspecified site, initial encounter     Discharge Instructions      - We will let you if any of the tickborne illness testing comes back positive - Please use insect repellent when outside -Follow-up with your primary care provider for a checkup when you are able to in the next couple of weeks     ED Prescriptions   None    I have reviewed the PDMP during this encounter.   Eulogio Bear, NP 03/16/22 1344

## 2022-03-18 LAB — ROCKY MTN SPOTTED FVR ABS PNL(IGG+IGM)
RMSF IgG: NEGATIVE
RMSF IgM: 0.49 index (ref 0.00–0.89)

## 2022-03-18 LAB — LYME DISEASE SEROLOGY W/REFLEX: Lyme Total Antibody EIA: NEGATIVE

## 2022-04-22 ENCOUNTER — Emergency Department (HOSPITAL_COMMUNITY): Payer: Medicare PPO

## 2022-04-22 ENCOUNTER — Encounter (HOSPITAL_COMMUNITY): Payer: Self-pay

## 2022-04-22 ENCOUNTER — Emergency Department (HOSPITAL_COMMUNITY)
Admission: EM | Admit: 2022-04-22 | Discharge: 2022-04-22 | Disposition: A | Discharge status: Home / Self Care | Payer: Medicare PPO | Attending: Emergency Medicine | Admitting: Emergency Medicine

## 2022-04-22 DIAGNOSIS — R531 Weakness: Secondary | ICD-10-CM | POA: Diagnosis not present

## 2022-04-22 DIAGNOSIS — Z7982 Long term (current) use of aspirin: Secondary | ICD-10-CM | POA: Insufficient documentation

## 2022-04-22 DIAGNOSIS — R1084 Generalized abdominal pain: Secondary | ICD-10-CM | POA: Diagnosis not present

## 2022-04-22 DIAGNOSIS — R5383 Other fatigue: Secondary | ICD-10-CM | POA: Diagnosis not present

## 2022-04-22 LAB — CBC
HCT: 46.1 % (ref 39.0–52.0)
Hemoglobin: 16 g/dL (ref 13.0–17.0)
MCH: 31.1 pg (ref 26.0–34.0)
MCHC: 34.7 g/dL (ref 30.0–36.0)
MCV: 89.5 fL (ref 80.0–100.0)
Platelets: 220 10*3/uL (ref 150–400)
RBC: 5.15 MIL/uL (ref 4.22–5.81)
RDW: 11.7 % (ref 11.5–15.5)
WBC: 7.3 10*3/uL (ref 4.0–10.5)
nRBC: 0 % (ref 0.0–0.2)

## 2022-04-22 LAB — COMPREHENSIVE METABOLIC PANEL
ALT: 44 U/L (ref 0–44)
AST: 30 U/L (ref 15–41)
Albumin: 4.3 g/dL (ref 3.5–5.0)
Alkaline Phosphatase: 67 U/L (ref 38–126)
Anion gap: 9 (ref 5–15)
BUN: 15 mg/dL (ref 8–23)
CO2: 26 mmol/L (ref 22–32)
Calcium: 9.2 mg/dL (ref 8.9–10.3)
Chloride: 107 mmol/L (ref 98–111)
Creatinine, Ser: 0.88 mg/dL (ref 0.61–1.24)
GFR, Estimated: >60 mL/min (ref >60)
Glucose, Bld: 118 mg/dL — ABNORMAL HIGH (ref 70–99)
Potassium: 4 mmol/L (ref 3.5–5.1)
Sodium: 142 mmol/L (ref 135–145)
Total Bilirubin: 0.6 mg/dL (ref 0.3–1.2)
Total Protein: 7.4 g/dL (ref 6.5–8.1)

## 2022-04-22 LAB — LIPASE, BLOOD: Lipase: 37 U/L (ref 11–51)

## 2022-04-22 MED ORDER — IOHEXOL 300 MG/ML  SOLN
100.0000 mL | Freq: Once | INTRAMUSCULAR | Status: AC | PRN
  Administered 2022-04-22: 100 mL via INTRAVENOUS

## 2022-04-22 NOTE — ED Provider Notes (Signed)
[facility] EMERGENCY DEPARTMENT Provider Note   CSN: [id number] Arrival date & time: 04/22/22  1523     History  Chief Complaint  Patient presents with   Fatigue    Craig Scott is a 66 y.o. male with past medical history significant for Lynch syndrome who has had multiple polyps removed during yearly colonoscopies, strong family history of cancer, history of osteoarthritis, acid reflux, hypercholesterolemia, history of alcohol abuse, reports that he used to drink 15-20 beers a day, has cut down to 3 to 4 beers per day.  Also endorses history of anxiety who presents with concern for generalized fatigue, decreased energy, and generalized abdominal pain for the last 4 weeks.  Endorses some intermittent nausea.  Reports that he has been bitten by 26 ticks over the last several weeks, was seen and evaluated for Lyme and [facility] a few weeks ago but reports that he has had several new tick bites since then.  He thinks that his fatigue and symptoms are overall worsening.  He denies any recent rash.  Patient reports that at his last colonoscopy he had a polyp that was slightly greater than normal after waiting greater than 1 year between 2 colonoscopies and his GI doctor was frustrated and warned him that he is likely to have high risk of cancer if he does not undergo screening.  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  Patient reports that he has also had general chills, has been sleeping in a 73 to 74 degree room in his house is used to sleeping much colder temperature.  HPI     Home Medications Prior to Admission medications   Medication Sig Start Date End Date Taking? Authorizing Provider  ALPRAZolam Duanne Moron) 1 MG tablet Take 1 mg by mouth 2 (two) times daily. Anxiety can take up to 2 extra prn    [provider]   Apoaequorin (PREVAGEN) 10 MG CAPS Take 10 mg by mouth daily.    [provider]  aspirin EC 81 MG tablet Take 1 tablet (81 mg total) by mouth 2 (two) times daily. Patient taking differently: Take 81 mg by mouth. 06/17/17   Corky Sing, PA-C  celecoxib (CELEBREX) 200 MG capsule Take 200 mg by mouth at bedtime.    [provider]  cetirizine (ZYRTEC) 10 MG tablet Take 10 mg by mouth daily.    [provider]  HUMIRA PEN 40 MG/0.4ML PNKT Inject 40 mg into the skin every 14 (fourteen) days. 06/06/21   [provider]  HYDROcodone-acetaminophen (NORCO) 7.5-325 MG tablet Take 1 tablet by mouth every 6 (six) hours as needed for moderate pain. 09/12/21   Festus Aloe, MD  hydroxychloroquine (PLAQUENIL) 200 MG tablet Take 400 mg by mouth daily. 07/04/21   [provider]  Melatonin 3 MG TABS Take 3 mg by mouth at bedtime.    [provider]  Misc Natural Products (GLUCOSAMINE CHOND MSM FORMULA) TABS Take 1 tablet by mouth daily.    [provider]  Multiple Vitamin (MULTIVITAMIN WITH MINERALS) TABS tablet Take 1 tablet by mouth daily.    [provider]  Niacin (VITAMIN B-3 PO) Take 1 tablet by mouth daily at 6 (six) AM.    [provider]  Potassium 99 MG TABS Take 99 mg by mouth daily.  [provider]  sildenafil (VIAGRA) 25 MG tablet Take 25 mg by mouth daily as needed. 07/04/21   [provider]  simvastatin (ZOCOR) 20 MG tablet Take 20 mg by mouth at bedtime.    [provider]  zinc gluconate 50 MG tablet Take 50 mg by mouth daily.    [provider]      Allergies    Lyrica [pregabalin]    Review of Systems   Review of Systems  Gastrointestinal:  Positive for abdominal pain.  All other systems reviewed and are negative.   Physical Exam Updated Vital Signs BP 122/73   Pulse (!) 56   Temp 98.2 F (36.8 C) (Oral)   Resp 16   Ht 6' (1.829 m)   Wt 104.2 kg    SpO2 95%   BMI 31.15 kg/m  Physical Exam Vitals and nursing note reviewed.  Constitutional:      General: He is not in acute distress.    Appearance: Normal appearance.  HENT:     Head: Normocephalic and atraumatic.  Eyes:     General:        Right eye: No discharge.        Left eye: No discharge.  Cardiovascular:     Rate and Rhythm: Normal rate and regular rhythm.     Heart sounds: No murmur heard.    No friction rub. No gallop.  Pulmonary:     Effort: Pulmonary effort is normal.     Breath sounds: Normal breath sounds.  Abdominal:     General: Bowel sounds are normal.     Palpations: Abdomen is soft.     Comments: Somewhat tender to palpation throughout the entire abdomen, most focally in the epigastric region.  There is some minimal distention but no rigidity, guarding, rebound, hemorrhage, or ecchymosis on abdominal wall.  Skin:    General: Skin is warm and dry.     Capillary Refill: Capillary refill takes less than 2 seconds.  Neurological:     Mental Status: He is alert and oriented to person, place, and time.     Comments: Cranial nerves II through XII grossly intact.  Intact finger-nose, intact heel-to-shin.  Romberg negative, gait normal.  Alert and oriented x3.  Moves all 4 limbs spontaneously, normal coordination.  No pronator drift.  Intact strength 5 out of 5 bilateral upper and lower extremities.    Psychiatric:        Mood and Affect: Mood normal.        Behavior: Behavior normal.     ED Results / Procedures / Treatments   Labs (all labs ordered are listed, but only abnormal results are displayed) Labs Reviewed  COMPREHENSIVE METABOLIC PANEL - Abnormal; Notable for the following components:      Result Value   Glucose, Bld 118 (*)    All other components within normal limits  LIPASE, BLOOD  CBC  LYME DISEASE SEROLOGY W/REFLEX  ROCKY MTN SPOTTED FVR ABS PNL(IGG+IGM)    EKG None  Radiology CT ABDOMEN PELVIS W CONTRAST  Result Date:  04/22/2022 CLINICAL DATA:  Abdominal pain. EXAM: CT ABDOMEN AND PELVIS WITH CONTRAST TECHNIQUE: Multidetector CT imaging of the abdomen and pelvis was performed using the standard protocol following bolus administration of intravenous contrast. RADIATION DOSE REDUCTION: This exam was performed according to the departmental dose-optimization program which includes automated exposure control, adjustment of the mA and/or kV according to patient size and/or use of iterative reconstruction technique. CONTRAST:  145m OMNIPAQUE  IOHEXOL 300 MG/ML  SOLN COMPARISON:  July 09, 2021 FINDINGS: Lower chest: Mitral valve calcifications.  No acute findings. Hepatobiliary: No focal liver abnormality is seen. No gallstones, gallbladder wall thickening, or biliary dilatation. Pancreas: Unremarkable. No pancreatic ductal dilatation or surrounding inflammatory changes. Spleen: Normal in size without focal abnormality. Adrenals/Urinary Tract: Adrenal glands are unremarkable. Right kidney is normal, without renal calculi, focal lesion, or hydronephrosis. 7 mm too small to be actually characterize hypoattenuated circumscribed exophytic mass off of the midpole region of the left kidney. Bladder is unremarkable. Stomach/Bowel: Stomach is within normal limits. Appendix appears normal. No evidence of bowel wall thickening, distention, or inflammatory changes. Vascular/Lymphatic: Aortic atherosclerosis. No enlarged abdominal or pelvic lymph nodes. Reproductive: Prostate is unremarkable. Other: No abdominal wall hernia or abnormality. No abdominopelvic ascites. Musculoskeletal: Intact left total hip arthroplasty. Posterior facet arthropathy in the lower lumbosacral spine. IMPRESSION: 1. No acute abnormalities within the abdomen or pelvis. 2. 7 mm too small to be actually characterize hypoattenuated circumscribed exophytic mass off of the midpole region of the left kidney. This finding is stable from September 14, 2018, suggesting benign  etiology. Aortic Atherosclerosis (ICD10-I70.0). Electronically Signed   By: Fidela Salisbury M.D.   On: 04/22/2022 18:44   CT Head Wo Contrast  Result Date: 04/22/2022 CLINICAL DATA:  Provided history: Dizziness, persistent/recurrent, cardiac or vascular cause suspected. Additional history provided: Increased fatigue and weakness after being bit by a tick weeks ago. EXAM: CT HEAD WITHOUT CONTRAST TECHNIQUE: Contiguous axial images were obtained from the base of the skull through the vertex without intravenous contrast. RADIATION DOSE REDUCTION: This exam was performed according to the departmental dose-optimization program which includes automated exposure control, adjustment of the mA and/or kV according to patient size and/or use of iterative reconstruction technique. COMPARISON:  Head CT 09/14/2018. FINDINGS: Brain: Mild generalized parenchymal atrophy. Mild ill-defined hypoattenuation within the cerebral white matter, nonspecific but compatible with chronic small vessel disease. There is no acute intracranial hemorrhage. No demarcated cortical infarct. No extra-axial fluid collection. No evidence of an intracranial mass. No midline shift. Vascular: No hyperdense vessel. Atherosclerotic calcifications. Skull: No fracture or aggressive osseous lesion. Sinuses/Orbits: No mass or acute finding within the imaged orbits. Mild mucosal thickening, and small-volume fluid, within the left frontoethmoidal recess, bilateral ethmoid sinuses and right sphenoid sinus. Minimal mucosal thickening within the left sphenoid sinus. IMPRESSION: No evidence of acute intracranial abnormality. Mild chronic small vessel ischemic changes within the cerebral white matter. Mild generalized parenchymal atrophy. Mild paranasal sinus disease, as described. Electronically Signed   By: Kellie Simmering D.O.   On: 04/22/2022 18:36    Procedures Procedures    Medications Ordered in ED Medications  iohexol (OMNIPAQUE) 300 MG/ML solution  100 mL (100 mLs Intravenous Contrast Given 04/22/22 1823)    ED Course/ Medical Decision Making/ A&P                           Medical Decision Making Amount and/or Complexity of Data Reviewed Labs: ordered.   This patient is a 66 y.o. male who presents to the ED for concern of generalized abdominal pain, fatigue, weakness, this involves an extensive number of treatment options, and is a complaint that carries with it a high risk of complications and morbidity. The emergent differential diagnosis prior to evaluation includes, but is not limited to, possible tickborne illness, vitamin deficiency, electrolyte deficiency, dehydration, gastroenteritis, medication effects, thyroid abnormality, vitamin D deficiency, vitamin B deficiency, depression, versus  other.   This is not an exhaustive differential.   Past Medical History / Co-morbidities / Social History: Lynch syndrome who has had multiple polyps removed during yearly colonoscopies, strong family history of cancer, history of osteoarthritis, acid reflux, hypercholesterolemia, history of alcohol abuse  Additional history: Chart reviewed. Pertinent results include: Reviewed outpatient workup with negative serology for tickborne illnesses around that 1 month ago, as well as labs, imaging performed by PCP  Physical Exam: Physical exam performed. The pertinent findings include: Generalized abdominal tenderness, no focal neurologic deficits, overall well appearance otherwise  Lab Tests: I ordered, and personally interpreted labs.  The pertinent results include: CMP unremarkable, very mild hyperglycemia glucose 118.  Lipase normal.  CBC unremarkable.  RMSF and Lyme serology will result in several days.   Imaging Studies: I ordered imaging studies including CT abdomen pelvis with contrast, CT head without contrast. I independently visualized and interpreted imaging which showed chronic microvascular changes on CT head without acute pathology  noted.  Abdominal CT shows stable exophytic lesion near left kidney, no other acute intra-abdominal pathology to explain patient's abdominal pain. I agree with the radiologist interpretation.   Disposition: After consideration of the diagnostic results and the patients response to treatment, I feel that patient with symptoms consistent with generalized fatigue, and weakness, his emergency department evaluation today does not reveal a salient cause for his multiple symptoms, discussed that in the absence of an emergent diagnosis at this time I think that appropriate workup for chronic over the last 1 to 2 months weakness and fatigue would be best performed by his primary care doctor and other specialists.  Patient understands and agrees to this plan..   emergency department workup does not suggest an emergent condition requiring admission or immediate intervention beyond what has been performed at this time. The plan is: As above. The patient is safe for discharge and has been instructed to return immediately for worsening symptoms, change in symptoms or any other concerns.  I discussed this case with my attending physician Dr. Eulis Foster who cosigned this note including patient's presenting symptoms, physical exam, and planned diagnostics and interventions. Attending physician stated agreement with plan or made changes to plan which were implemented.    Final Clinical Impression(s) / ED Diagnoses Final diagnoses:  Other fatigue  Weakness  Generalized abdominal pain    Rx / DC Orders ED Discharge Orders     None         Dorien Chihuahua 04/22/22 Joanne Gavel, MD 04/23/22 [phone]

## 2022-04-22 NOTE — ED Triage Notes (Signed)
Pt states he had 26 tick bites and was given abx. Pt had reaction to initial abx. Pt then received aracef and lexapro. Pt states he has had decreased energy.   Pt c/o generalized abd pain

## 2022-04-22 NOTE — Discharge Instructions (Signed)
Your workup today did not reveal any obvious cause for your month of fatigue, weakness, abdominal pain.  We are running new titers for [facility] and Lyme which should result in the next few days, however there are several tests that we do not generally perform the emergency department that you can ask your primary care doctor to look into that may be contributing to your fatigue including TSH, vitamin B, vitamin D.  If you continue to have ongoing fatigue, your primary care doctor cannot figure out the cause may want to seek specialist care with potentially a GI doctor or rheumatologist if you continue to have generalized unexplained symptoms or if your symptoms seem to crystallize into specific abdominal pain.

## 2022-04-23 LAB — LYME DISEASE SEROLOGY W/REFLEX: Lyme Total Antibody EIA: NEGATIVE

## 2022-04-24 LAB — ROCKY MTN SPOTTED FVR ABS PNL(IGG+IGM)
RMSF IgG: POSITIVE — AB
RMSF IgM: 0.6 index (ref 0.00–0.89)

## 2022-04-24 LAB — RMSF, IGG, IFA: RMSF, IGG, IFA: <1:64 {titer}

## 2022-06-22 DIAGNOSIS — R413 Other amnesia: Secondary | ICD-10-CM | POA: Diagnosis not present

## 2022-06-23 ENCOUNTER — Ambulatory Visit: Payer: Medicare PPO | Admitting: Internal Medicine

## 2022-06-30 ENCOUNTER — Telehealth: Payer: Self-pay

## 2022-06-30 ENCOUNTER — Encounter: Payer: Self-pay | Admitting: Internal Medicine

## 2022-06-30 ENCOUNTER — Ambulatory Visit: Payer: Medicare PPO | Admitting: Internal Medicine

## 2022-06-30 VITALS — BP 136/77 | HR 82 | Temp 97.8°F | Ht 71.0 in | Wt 234.2 lb

## 2022-06-30 DIAGNOSIS — Z1509 Genetic susceptibility to other malignant neoplasm: Secondary | ICD-10-CM | POA: Diagnosis not present

## 2022-06-30 DIAGNOSIS — Z86010 Personal history of colonic polyps: Secondary | ICD-10-CM

## 2022-06-30 NOTE — Telephone Encounter (Signed)
Fowarding to Dr. Gala Romney to advise if okay to proceed with scheduling

## 2022-06-30 NOTE — Progress Notes (Signed)
Primary Care Physician:  Redmond School, MD Primary Gastroenterologist:  Dr. Gala Romney  Pre-Procedure History & Physical: HPI:  Craig Scott is a 66 y.o. male with Lynch syndrome here to set up a surveillance colonoscopy. History of multiple tubular adenomas removed from his colon about a year ago.  EGD demonstrated unremarkable upper GI tract.  Biopsies negative for H. pylori.  Pt denies any melena; rare hematochezia.  He continues to consume alcohol in the form of beer on a frequent basis.  States has had 2 episodes of incontinence since I last saw him.  No diarrhea. Suffered a bout of [place] spotted fever this past summer which was treated. Takes Humira and Plaquenil for rheumatoid arthritis.  He did not bring a list of meds with him today. History of GERD.  Patient unsure whether or not he is taking any specific medication at this time. I note he now takes Prevagen and Aricept. 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 16 yrs ago   Tubular adenoma    Urinary frequency    Wears glasses     Past Surgical History:  Procedure Laterality Date   BASAL CELL CARCINOMA EXCISION     BIOPSY  09/04/2021   Procedure: BIOPSY;  Surgeon: Daneil Dolin, MD;  [facility];  Service: Endoscopy;;   BUNIONECTOMY WITH HAMMERTOE RECONSTRUCTION AND GASTROC SLIDE Right 06/27/2015   Procedure: RIGHT GASTROC RECESSION/POSTERIOR TIBIAL TENOLYSIS/SECOND AND THIRD METATARSAL WEIL OSTEOTOMY AND HAMMERTOE CORRECTION;  Surgeon: Wylene Simmer, MD;   Location: [place];  Service: Orthopedics;  Laterality: Right;   CALCANEAL OSTEOTOMY Right 06/27/2015   Procedure: CALCANEAL OSTEOTOMY;  Surgeon: Wylene Simmer, MD;  Location: [place];  Service: Orthopedics;  Laterality: Right;   COLONOSCOPY  12/09/2001   RMR: Normal normal rectum/ A few scattered left-sided diverticula.  The remainder of the colonic  mucosa appeared normal   COLONOSCOPY  12/15/2002   RMR: Normal rectum, scattered left-sided diverticula.  The remainder of the colonic mucosa appeared normal   COLONOSCOPY  04/10/2004   RMR: Normal rectum/Sigmoid diverticula/The remainder of the colonic mucosa appeared normal   COLONOSCOPY  12/23/2005   RMR: Normal rectum, scattered sigmoid diverticula Colonic mucosa appeared normal   COLONOSCOPY  01/20/2008   RMR: Distal diminutive rectal polyps, status post cold biopsy removal  otherwise, normal rectum/ biopsy removal; scattered sigmoid diverticula; and benign-appearing/ulcers about the ileocecal valve, status post biopsy.  Remainder of colonic mucosa and terminal mucosa appeared normal.   COLONOSCOPY  05/22/2010   RMR: distal diminutive rectal polyp s/p bx otherwise normal/few scattered pancolonic diverticula   COLONOSCOPY  09/16/2012   Dr.Rayah Fines- normal rectum, few scattered pancolonic diverticulosis, one diminutive polyp in the base of the cecum. 3x3 area of hyper pigmentation i the mid descending segment, this was a flat benign-appearing area o/w the remainder of the colonic  mucosa appeared normal. bx= tubular adenoma and benign colonic mucosa   COLONOSCOPY WITH PROPOFOL N/A 09/05/2015   Procedure: COLONOSCOPY WITH PROPOFOL;  Surgeon: Daneil Dolin, MD;  [facility];  Service: Endoscopy;  Laterality: N/A;  0830   COLONOSCOPY WITH PROPOFOL N/A 05/24/2017   Procedure: COLONOSCOPY WITH PROPOFOL;  Surgeon: Daneil Dolin, MD;  [facility];  Service: Endoscopy;  Laterality: N/A;    COLONOSCOPY WITH PROPOFOL N/A 09/04/2021   Procedure: COLONOSCOPY WITH PROPOFOL;  Surgeon: Daneil Dolin, MD;  [facility];  Service: Endoscopy;  Laterality: N/A;  1:15 / ASA III-DO NOT MOVE. Rep is coming for EXALT ERCP scope.   colonscopy  09/04/2021   @ [facility] [facility]   CYSTOSCOPY WITH BIOPSY N/A 09/12/2021   Procedure: CYSTOSCOPY WITH BLADDERBIOPSY/ FULGURATION/ INSTILLATION OF MARCAINE/PYRIDIUM;  Surgeon: Festus Aloe, MD;  Location: [facility];  Service: Urology;  Laterality: N/A;   ESOPHAGOGASTRODUODENOSCOPY  12/23/2005   RMR:   Esophagogastric peptic stricture with reflux esophagitis, status post  dilation as described above/ Small hiatal hernia, otherwise normal stomach.  Bulbar erosions otherwise normal D1 and D2   ESOPHAGOGASTRODUODENOSCOPY (EGD) WITH PROPOFOL  09/04/2021   Procedure: ESOPHAGOGASTRODUODENOSCOPY (EGD) WITH PROPOFOL;  Surgeon: Daneil Dolin, MD;  [facility];  Service: Endoscopy;;   EYE SURGERY  as child   FOOT ARTHRODESIS Right 06/17/2017   Procedure: Right Talonavicular and Subtalar Arthrodesis;  Surgeon: Wylene Simmer, MD;  Location: [place];  Service: Orthopedics;  Laterality: Right;   HARDWARE REMOVAL Right 06/25/2016   Procedure: REMOVAL OF DEEP IMPLANTS RIGHT MEDIAL CUNEIFORM, RIGHT CALCANEAL REMOVAL OF DEEP IMPLANTS;  Surgeon: Wylene Simmer, MD;  Location: [place];  Service: Orthopedics;  Laterality: Right;   JOINT REPLACEMENT  09/30/2010   Right knee   KNEE SURGERY Right    bilat    NOSE SURGERY Left    cartiledge removal   POLYPECTOMY  09/05/2015   Procedure: POLYPECTOMY;  Surgeon: Daneil Dolin, MD;  [facility];  Service: Endoscopy;;   POLYPECTOMY  05/24/2017   Procedure: POLYPECTOMY;  Surgeon: Daneil Dolin, MD;  [facility];  Service: Endoscopy;;  colon   POLYPECTOMY  09/04/2021   Procedure: POLYPECTOMY;  Surgeon: Daneil Dolin, MD;  [facility];  Service: Endoscopy;;   RHINOPLASTY     TOTAL HIP ARTHROPLASTY Left 03/20/2015   Procedure: LEFT TOTAL HIP ARTHROPLASTY ANTERIOR APPROACH;  Surgeon: Gaynelle Arabian, MD;  Location[facility];  Service: Orthopedics;  Laterality: Left;   TOTAL KNEE ARTHROPLASTY Left 10/23/2013   Procedure: LEFT TOTAL KNEE ARTHROPLASTY;  Surgeon: [name], MD;  Location: [facility];  Service: Orthopedics;  Laterality: Left;   TYMPANOSTOMY TUBE PLACEMENT Right    early 2014 and multiple times   TYMPANOSTOMY TUBE PLACEMENT      Prior to Admission medications   Medication Sig Start Date End Date Taking? Authorizing Provider  ALPRAZolam Duanne Moron) 1 MG tablet Take 1 mg by mouth 2 (two) times daily. Anxiety can take up to 2 extra prn    [provider]  Apoaequorin (PREVAGEN) 10 MG CAPS Take 10 mg by mouth daily.    [provider]  aspirin EC 81 MG tablet Take 1 tablet (81 mg total) by mouth 2 (two) times daily. Patient taking differently: Take 81 mg by mouth. 06/17/17   Corky Sing, PA-C  celecoxib (CELEBREX) 200 MG capsule Take 200 mg  by mouth at bedtime.    [provider]  cetirizine (ZYRTEC) 10 MG tablet Take 10 mg by mouth daily.    [provider]  HUMIRA PEN 40 MG/0.4ML PNKT Inject 40 mg into the skin every 14 (fourteen) days. 06/06/21   [provider]  HYDROcodone-acetaminophen (NORCO) 7.5-325 MG tablet Take 1 tablet by mouth every 6 (six) hours as needed for moderate pain. 09/12/21   Festus Aloe, MD  hydroxychloroquine (PLAQUENIL) 200 MG tablet Take 400 mg by mouth daily. 07/04/21   [provider]  Melatonin 3 MG TABS Take 3 mg by mouth at bedtime.    [provider]  Misc Natural Products (GLUCOSAMINE CHOND MSM FORMULA) TABS Take 1 tablet by mouth daily.    [provider]  Multiple Vitamin (MULTIVITAMIN WITH MINERALS) TABS tablet Take 1 tablet by mouth daily.    [provider]  Niacin (VITAMIN B-3 PO)  Take 1 tablet by mouth daily at 6 (six) AM.    [provider]  Potassium 99 MG TABS Take 99 mg by mouth daily.    [provider]  sildenafil (VIAGRA) 25 MG tablet Take 25 mg by mouth daily as needed. 07/04/21   [provider]  simvastatin (ZOCOR) 20 MG tablet Take 20 mg by mouth at bedtime.    [provider]  zinc gluconate 50 MG tablet Take 50 mg by mouth daily.    [provider]    Allergies as of 06/30/2022 - Review Complete 06/30/2022  Allergen Reaction Noted   Doxycycline  06/30/2022   Lyrica [pregabalin] Other (See Comments) 03/14/2015    Family History  Problem Relation Age of Onset   Colon cancer Brother        22    Colon cancer Brother        23   Colon cancer Mother        age 57    Dementia Mother    Arthritis Mother    Heart attack Father    Colon cancer Cousin        20 years old onset   Colon cancer Cousin        32 years onset    Social History   Socioeconomic History   Marital status: Married    Spouse name: Not on file   Number of children: 2   Years of education: 12   Highest education level: Not on file  Occupational History   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: [employer]    Comment: [employer]  Tobacco Use   Smoking status: Former    Packs/day: 0.50    Years: 30.00    Total pack years: 15.00    Types: Cigars, Cigarettes    Quit date: 09/14/2005    Years since quitting: 16.8   Smokeless tobacco: Never  Vaping Use   Vaping Use: Never used  Substance and Sexual Activity   Alcohol use: Yes    Comment: 8-9 beers/day; some days drinks more   Drug use: Not Currently    Comment: no marijuana use in 25 yrs per pt on 09-09-2021   Sexual activity: Yes    Birth control/protection: None  Other Topics Concern   Not on file  Social History Narrative   ** Merged History Encounter **       Social Determinants of Health   Financial Resource Strain: Not on file  Food Insecurity: Not on file   Transportation Needs: Not on file  Physical Activity: Not on file  Stress: Not on file  Social Connections: Not on file  Intimate Partner Violence: Not on file    Review of Systems: See HPI, otherwise negative ROS  Physical Exam: BP 136/77 (BP Location: Right Arm, Patient Position: Sitting, Cuff Size: Large)   Pulse 82   Temp 97.8 F (36.6 C) (Oral)   Ht '5\' 11"'$  (1.803 m)   Wt 234 lb 3.2 oz (106.2 kg)   SpO2 98%   BMI 32.66 kg/m  General:   Alert,  Well-developed,  pleasant and cooperative in NAD Mouth:  No deformity or lesions. Neck:  Supple; no masses or thyromegaly. No significant cervical adenopathy. Lungs:  Clear throughout to auscultation.   No wheezes, crackles, or rhonchi. No acute distress. Heart:  Regular rate and rhythm; no murmurs, clicks, rubs,  or gallops. Abdomen: Non-distended, normal bowel sounds.  Soft and nontender without appreciable mass or hepatosplenomegaly.  Pulses:  Normal pulses noted. Extremities:  Without clubbing or edema.  Impression/Plan: 66 year old gentleman with Lynch syndrome and history multiple colonic adenomas removed over time.  He is due for 1 year surveillance colonoscopy.  Occasional paper hematochezia.  Rare incontinence. History of significant alcohol use disorder.  He has no upper GI tract symptoms.  Recommendations:  I have offered the patient a surveillance colonoscopy.  History of Lynch syndrome and colonic adenomas.  ASA 3. The risks, benefits, limitations, alternatives and imponderables have been reviewed with the patient. Questions have been answered. All parties are agreeable.    Patient is to provide [place] a list of his current medications in the near future.  Repeat EGD in about 2 years from now.   Addendum: Patient brought in his medications.  List has been updated.  We will proceed with a surveillance colonoscopy in the near future.     Notice: This dictation was prepared with Dragon dictation along with smaller  phrase technology. Any transcriptional errors that result from this process are unintentional and may not be corrected upon review.

## 2022-06-30 NOTE — Patient Instructions (Addendum)
It was good to see you again today!  As discussed, we will set up a surveillance colonoscopy (history of Lynch syndrome and colonic polyps) in the near future at [facility].  Further recommendations to follow pending results of colonoscopy.  These provide [place] with a list of your current medications so we can update your chart.

## 2022-06-30 NOTE — Telephone Encounter (Signed)
Called pt, no answer and VM not set up yet

## 2022-06-30 NOTE — Telephone Encounter (Signed)
Patient dropped off current medication list. Medication list in chart has been updated.

## 2022-07-06 NOTE — Telephone Encounter (Signed)
Called pt, no answer and VM not set up. Letter mailed

## 2022-07-20 NOTE — Telephone Encounter (Signed)
Called pt, no answer and no VM, WCB

## 2022-08-11 DIAGNOSIS — R413 Other amnesia: Secondary | ICD-10-CM | POA: Diagnosis not present

## 2022-08-11 NOTE — Telephone Encounter (Signed)
Pt called to schedule his colonoscopy. Advised pt that Dr. Roseanne Kaufman schedule is booked for next month. We will call as soon as we get January's schedule. Verbalized understanding

## 2022-08-12 ENCOUNTER — Encounter: Payer: Self-pay | Admitting: *Deleted

## 2022-08-12 MED ORDER — CLENPIQ 10-3.5-12 MG-GM -GM/175ML PO SOLN: 1.0000 | ORAL | 0 refills | Status: DC

## 2022-08-12 NOTE — Telephone Encounter (Signed)
Pt has been scheduled for 09/16/22 at 7:30 am, instructions mailed and prep sent to the pharmacy.   [employer] PA: Approved Authorization #[id number]  Tracking #[id number]  DOS:09/16/22-10/17/22

## 2022-08-19 DIAGNOSIS — R413 Other amnesia: Secondary | ICD-10-CM | POA: Diagnosis not present

## 2022-08-26 DIAGNOSIS — R413 Other amnesia: Secondary | ICD-10-CM | POA: Diagnosis not present

## 2022-09-09 NOTE — Patient Instructions (Signed)
Craig Scott  09/09/2022     '@PREFPERIOPPHARMACY'$ @   Your procedure is scheduled on  09/15/2022.   Report to [facility] at 0600 A.M.   Call this number if you have problems the morning of surgery:  [phone]  If you experience any cold or flu symptoms such as cough, fever, chills, shortness of breath, etc. between now and your scheduled surgery, please notify us at the above number.   Remember:  Follow the diet and prep instructions given to you by the office.    Take these medicines the morning of surgery with A SIP OF WATER                           Xanax(if needed), lexapro.    Do not wear jewelry, make-up or nail polish.  Do not wear lotions, powders, or perfumes, or deodorant.  Do not shave 48 hours prior to surgery.  Men may shave face and neck.  Do not bring valuables to the hospital.  [facility] is not responsible for any belongings or valuables.  Contacts, dentures or bridgework may not be worn into surgery.  Leave your suitcase in the car.  After surgery it may be brought to your room.  For patients admitted to the hospital, discharge time will be determined by your treatment team.  Patients discharged the day of surgery will not be allowed to drive home.    Special instructions:   DO NOT smoke tobacco or vape for 24 hours before your procedure.  Please read over the following fact sheets that you were given. Anesthesia Post-op Instructions and Care and Recovery After Surgery      Colonoscopy, Adult, Care After The following information offers guidance on how to care for yourself after your procedure. Your health care provider may also give you more specific instructions. If you have problems or questions, contact your health care provider. What can I expect after the procedure? After the procedure, it is common to have: A small amount of blood in your stool for 24 hours after the procedure. Some gas. Mild cramping or bloating of your  abdomen. Follow these instructions at home: Eating and drinking  Drink enough fluid to keep your urine pale yellow. Follow instructions from your health care provider about eating or drinking restrictions. Resume your normal diet as told by your health care provider. Avoid heavy or fried foods that are hard to digest. Activity Rest as told by your health care provider. Avoid sitting for a long time without moving. Get up to take short walks every 1-2 hours. This is important to improve blood flow and breathing. Ask for help if you feel weak or unsteady. Return to your normal activities as told by your health care provider. Ask your health care provider what activities are safe for you. Managing cramping and bloating  Try walking around when you have cramps or feel bloated. If directed, apply heat to your abdomen as told by your health care provider. Use the heat source that your health care provider recommends, such as a moist heat pack or a heating pad. Place a towel between your skin and the heat source. Leave the heat on for 20-30 minutes. Remove the heat if your skin turns bright red. This is especially important if you are unable to feel pain, heat, or cold. You have a greater risk of getting burned. General instructions If you were given a  sedative during the procedure, it can affect you for several hours. Do not drive or operate machinery until your health care provider says that it is safe. For the first 24 hours after the procedure: Do not sign important documents. Do not drink alcohol. Do your regular daily activities at a slower pace than normal. Eat soft foods that are easy to digest. Take over-the-counter and prescription medicines only as told by your health care provider. Keep all follow-up visits. This is important. Contact a health care provider if: You have blood in your stool 2-3 days after the procedure. Get help right away if: You have more than a small spotting of  blood in your stool. You have large blood clots in your stool. You have swelling of your abdomen. You have nausea or vomiting. You have a fever. You have increasing pain in your abdomen that is not relieved with medicine. These symptoms may be an emergency. Get help right away. Call 911. Do not wait to see if the symptoms will go away. Do not drive yourself to the hospital. Summary After the procedure, it is common to have a small amount of blood in your stool. You may also have mild cramping and bloating of your abdomen. If you were given a sedative during the procedure, it can affect you for several hours. Do not drive or operate machinery until your health care provider says that it is safe. Get help right away if you have a lot of blood in your stool, nausea or vomiting, a fever, or increased pain in your abdomen. This information is not intended to replace advice given to you by your health care provider. Make sure you discuss any questions you have with your health care provider. Document Revised: 04/23/2021 Document Reviewed: 04/23/2021 Elsevier Patient Education  [name] After The following information offers guidance on how to care for yourself after your procedure. Your health care provider may also give you more specific instructions. If you have problems or questions, contact your health care provider. What can I expect after the procedure? After the procedure, it is common to have: Tiredness. Little or no memory about what happened during or after the procedure. Impaired judgment when it comes to making decisions. Nausea or vomiting. Some trouble with balance. Follow these instructions at home: For the time period you were told by your health care provider:  Rest. Do not participate in activities where you could fall or become injured. Do not drive or use machinery. Do not drink alcohol. Do not take sleeping pills or medicines  that cause drowsiness. Do not make important decisions or sign legal documents. Do not take care of children on your own. Medicines Take over-the-counter and prescription medicines only as told by your health care provider. If you were prescribed antibiotics, take them as told by your health care provider. Do not stop using the antibiotic even if you start to feel better. Eating and drinking Follow instructions from your health care provider about what you may eat and drink. Drink enough fluid to keep your urine pale yellow. If you vomit: Drink clear fluids slowly and in small amounts as you are able. Clear fluids include water, ice chips, low-calorie sports drinks, and fruit juice that has water added to it (diluted fruit juice). Eat light and bland foods in small amounts as you are able. These foods include bananas, applesauce, rice, lean meats, toast, and crackers. General instructions  Have a responsible adult stay with  you for the time you are told. It is important to have someone help care for you until you are awake and alert. If you have sleep apnea, surgery and some medicines can increase your risk for breathing problems. Follow instructions from your health care provider about wearing your sleep device: When you are sleeping. This includes during daytime naps. While taking prescription pain medicines, sleeping medicines, or medicines that make you drowsy. Do not use any products that contain nicotine or tobacco. These products include cigarettes, chewing tobacco, and vaping devices, such as e-cigarettes. If you need help quitting, ask your health care provider. Contact a health care provider if: You feel nauseous or vomit every time you eat or drink. You feel light-headed. You are still sleepy or having trouble with balance after 24 hours. You get a rash. You have a fever. You have redness or swelling around the IV site. Get help right away if: You have trouble breathing. You  have new confusion after you get home. These symptoms may be an emergency. Get help right away. Call 911. Do not wait to see if the symptoms will go away. Do not drive yourself to the hospital. This information is not intended to replace advice given to you by your health care provider. Make sure you discuss any questions you have with your health care provider. Document Revised: 01/26/2022 Document Reviewed: 01/26/2022 Elsevier Patient Education  [place].

## 2022-09-10 ENCOUNTER — Encounter (HOSPITAL_COMMUNITY)
Admission: RE | Admit: 2022-09-10 | Discharge: 2022-09-10 | Disposition: A | Discharge status: Home / Self Care | Payer: Medicare PPO | Source: Ambulatory Visit | Attending: General Surgery | Admitting: General Surgery

## 2022-09-10 VITALS — BP 158/87 | HR 63 | Temp 97.5°F | Resp 18 | Ht 70.0 in | Wt 239.0 lb

## 2022-09-10 DIAGNOSIS — F1010 Alcohol abuse, uncomplicated: Secondary | ICD-10-CM | POA: Diagnosis not present

## 2022-09-10 DIAGNOSIS — Z01812 Encounter for preprocedural laboratory examination: Secondary | ICD-10-CM | POA: Insufficient documentation

## 2022-09-10 LAB — COMPREHENSIVE METABOLIC PANEL
ALT: 51 U/L — ABNORMAL HIGH (ref 0–44)
AST: 35 U/L (ref 15–41)
Albumin: 4.3 g/dL (ref 3.5–5.0)
Alkaline Phosphatase: 74 U/L (ref 38–126)
Anion gap: 7 (ref 5–15)
BUN: 14 mg/dL (ref 8–23)
CO2: 27 mmol/L (ref 22–32)
Calcium: 9 mg/dL (ref 8.9–10.3)
Chloride: 105 mmol/L (ref 98–111)
Creatinine, Ser: 0.71 mg/dL (ref 0.61–1.24)
GFR, Estimated: >60 mL/min (ref >60)
Glucose, Bld: 92 mg/dL (ref 70–99)
Potassium: 4 mmol/L (ref 3.5–5.1)
Sodium: 139 mmol/L (ref 135–145)
Total Bilirubin: 0.5 mg/dL (ref 0.3–1.2)
Total Protein: 7.2 g/dL (ref 6.5–8.1)

## 2022-09-15 NOTE — Anesthesia Preprocedure Evaluation (Signed)
Anesthesia Evaluation    Airway Mallampati: III  TM Distance: >3 FB Neck ROM: Full    Dental no notable dental hx.    Pulmonary former smoker   Pulmonary exam normal breath sounds clear to auscultation       Cardiovascular Normal cardiovascular exam Rhythm:Regular Rate:Normal     Neuro/Psych   Anxiety        GI/Hepatic Neg liver ROS,GERD  ,,  Endo/Other  negative endocrine ROS    Renal/GU negative Renal ROS  negative genitourinary   Musculoskeletal  (+) Arthritis ,    Abdominal   Peds negative pediatric ROS (+)  Hematology negative hematology ROS (+)   Anesthesia Other Findings  Alcohol dependence, daily use  Reproductive/Obstetrics negative OB ROS                             Anesthesia Physical Anesthesia Plan  ASA: 2  Anesthesia Plan: General   Post-op Pain Management:    Induction: Intravenous  PONV Risk Score and Plan:   Airway Management Planned:   Additional Equipment:   Intra-op Plan:   Post-operative Plan: Extubation in OR  Informed Consent: I have reviewed the patients History and Physical, chart, labs and discussed the procedure including the risks, benefits and alternatives for the proposed anesthesia with the patient or authorized representative who has indicated his/her understanding and acceptance.       Plan Discussed with: CRNA  Anesthesia Plan Comments:        Anesthesia Quick Evaluation

## 2022-09-16 ENCOUNTER — Other Ambulatory Visit: Payer: Self-pay

## 2022-09-16 ENCOUNTER — Telehealth: Payer: Self-pay

## 2022-09-16 ENCOUNTER — Ambulatory Visit (HOSPITAL_COMMUNITY): Payer: Medicare PPO | Admitting: Anesthesiology

## 2022-09-16 ENCOUNTER — Ambulatory Visit (HOSPITAL_BASED_OUTPATIENT_CLINIC_OR_DEPARTMENT_OTHER): Payer: Medicare PPO | Admitting: Anesthesiology

## 2022-09-16 ENCOUNTER — Ambulatory Visit (HOSPITAL_COMMUNITY)
Admission: RE | Admit: 2022-09-16 | Discharge: 2022-09-16 | Disposition: A | Discharge status: Home / Self Care | Payer: Medicare PPO | Attending: Internal Medicine | Admitting: Internal Medicine

## 2022-09-16 ENCOUNTER — Encounter (HOSPITAL_COMMUNITY)
Admission: RE | Disposition: A | Discharge status: Home / Self Care | Payer: Self-pay | Source: Home / Self Care | Attending: Internal Medicine

## 2022-09-16 ENCOUNTER — Encounter (HOSPITAL_COMMUNITY): Payer: Self-pay | Admitting: Internal Medicine

## 2022-09-16 DIAGNOSIS — Z1509 Genetic susceptibility to other malignant neoplasm: Secondary | ICD-10-CM | POA: Diagnosis not present

## 2022-09-16 DIAGNOSIS — D124 Benign neoplasm of descending colon: Secondary | ICD-10-CM | POA: Insufficient documentation

## 2022-09-16 DIAGNOSIS — K640 First degree hemorrhoids: Secondary | ICD-10-CM | POA: Insufficient documentation

## 2022-09-16 DIAGNOSIS — K3189 Other diseases of stomach and duodenum: Secondary | ICD-10-CM | POA: Diagnosis not present

## 2022-09-16 DIAGNOSIS — M1990 Unspecified osteoarthritis, unspecified site: Secondary | ICD-10-CM | POA: Diagnosis not present

## 2022-09-16 DIAGNOSIS — K279 Peptic ulcer, site unspecified, unspecified as acute or chronic, without hemorrhage or perforation: Secondary | ICD-10-CM

## 2022-09-16 DIAGNOSIS — K648 Other hemorrhoids: Secondary | ICD-10-CM | POA: Insufficient documentation

## 2022-09-16 DIAGNOSIS — K529 Noninfective gastroenteritis and colitis, unspecified: Secondary | ICD-10-CM | POA: Diagnosis not present

## 2022-09-16 DIAGNOSIS — Z1211 Encounter for screening for malignant neoplasm of colon: Secondary | ICD-10-CM | POA: Insufficient documentation

## 2022-09-16 DIAGNOSIS — F1020 Alcohol dependence, uncomplicated: Secondary | ICD-10-CM | POA: Insufficient documentation

## 2022-09-16 DIAGNOSIS — Z800 Family history of malignant neoplasm of digestive organs: Secondary | ICD-10-CM

## 2022-09-16 DIAGNOSIS — K219 Gastro-esophageal reflux disease without esophagitis: Secondary | ICD-10-CM | POA: Diagnosis not present

## 2022-09-16 DIAGNOSIS — D122 Benign neoplasm of ascending colon: Secondary | ICD-10-CM

## 2022-09-16 DIAGNOSIS — F419 Anxiety disorder, unspecified: Secondary | ICD-10-CM | POA: Insufficient documentation

## 2022-09-16 DIAGNOSIS — D120 Benign neoplasm of cecum: Secondary | ICD-10-CM | POA: Insufficient documentation

## 2022-09-16 DIAGNOSIS — K269 Duodenal ulcer, unspecified as acute or chronic, without hemorrhage or perforation: Secondary | ICD-10-CM | POA: Diagnosis not present

## 2022-09-16 DIAGNOSIS — K5730 Diverticulosis of large intestine without perforation or abscess without bleeding: Secondary | ICD-10-CM

## 2022-09-16 DIAGNOSIS — Z87891 Personal history of nicotine dependence: Secondary | ICD-10-CM | POA: Diagnosis not present

## 2022-09-16 HISTORY — PX: COLONOSCOPY WITH PROPOFOL: SHX5780

## 2022-09-16 HISTORY — PX: POLYPECTOMY: SHX5525

## 2022-09-16 HISTORY — PX: BIOPSY: SHX5522

## 2022-09-16 HISTORY — PX: ESOPHAGOGASTRODUODENOSCOPY (EGD) WITH PROPOFOL: SHX5813

## 2022-09-16 SURGERY — COLONOSCOPY WITH PROPOFOL
Anesthesia: General

## 2022-09-16 MED ORDER — PROPOFOL 10 MG/ML IV BOLUS
INTRAVENOUS | Status: DC | PRN
  Administered 2022-09-16: 100 mg via INTRAVENOUS

## 2022-09-16 MED ORDER — STERILE WATER FOR IRRIGATION IR SOLN
Status: DC | PRN
  Administered 2022-09-16: .6 mL

## 2022-09-16 MED ORDER — LIDOCAINE HCL (CARDIAC) PF 100 MG/5ML IV SOSY
PREFILLED_SYRINGE | INTRAVENOUS | Status: DC | PRN
  Administered 2022-09-16: 50 mg via INTRATRACHEAL

## 2022-09-16 MED ORDER — PANTOPRAZOLE SODIUM 40 MG PO TBEC: 40.0000 mg | DELAYED_RELEASE_TABLET | Freq: Two times a day (BID) | ORAL | 3 refills | Status: DC

## 2022-09-16 MED ORDER — LACTATED RINGERS IV SOLN: INTRAVENOUS | Status: DC

## 2022-09-16 MED ORDER — PROPOFOL 500 MG/50ML IV EMUL
INTRAVENOUS | Status: DC | PRN
  Administered 2022-09-16: 200 ug/kg/min via INTRAVENOUS

## 2022-09-16 NOTE — Transfer of Care (Signed)
Immediate Anesthesia Transfer of Care Note  Patient: Craig Scott  Procedure(s) Performed: COLONOSCOPY WITH PROPOFOL ESOPHAGOGASTRODUODENOSCOPY (EGD) WITH PROPOFOL POLYPECTOMY BIOPSY  Patient Location: PACU  Anesthesia Type:General  Level of Consciousness: awake  Airway & Oxygen Therapy: Patient Spontanous Breathing  Post-op Assessment: Report given to RN and Post -op Vital signs reviewed and stable  Post vital signs: Reviewed and stable  Last Vitals:  Vitals Value Taken Time  BP    Temp    Pulse    Resp    SpO2      Last Pain:  Vitals:   09/16/22 0742  TempSrc:   PainSc: 0-No pain      Patients Stated Pain Goal: 7 (36/[id number])  Complications: No notable events documented.

## 2022-09-16 NOTE — Op Note (Signed)
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 Estimated Blood Loss:     Estimated blood loss was minimal. Procedure:                Pre-Anesthesia Assessment:                           - Prior to the procedure, a History and Physical                            was performed, and patient medications and                            allergies were reviewed. The patient's tolerance of                            previous anesthesia was also reviewed. The risks                            and benefits of the procedure and the sedation                            options and risks were discussed with the patient.                            All questions were answered, and informed consent                            was obtained. Prior Anticoagulants: The patient has                            taken no anticoagulant or antiplatelet agents. ASA                            Grade Assessment: III - A patient with severe  systemic disease. After reviewing the risks and                            benefits, the patient was deemed in satisfactory                            condition to undergo the procedure.                           After obtaining informed consent, the endoscope was                            passed under direct vision. Throughout the                             procedure, the patient's blood pressure, pulse, and                            oxygen saturations were monitored continuously. The                            [id number]-[id number] ([id number]) scope was introduced through the                            and advanced to the second part of duodenum. The                            upper GI endoscopy was accomplished without                            difficulty. The patient tolerated the procedure                            well. Scope In: 8:10:23 AM Scope Out: 8:18:29 AM Total Procedure Duration: 0 hours 8 minutes 6 seconds  Findings:      Tubular esophagus appeared normal undulating Z-line. Gastric cavity       empty; scattered erythema and superficial erosions. No ulcer or       infiltrating process seen. Pylorus patent. Marked edema of the duodenal       bulb there was a 1.2 cm elliptical ulcer in the base of the bulb with a       clean base. Please see photos. The second portion of the duodenum       appeared normal. Biopsies of the second portion of the duodenum as well       as gastric biopsies taken for histologic study. Impression:               Normal esophagus. Gastric erythema/erosions.                            Duodenal bulbar ulcer as described above. Status                            post gastric and duodenal biopsy.- No specimens  collected. Moderate Sedation:      Moderate (conscious) sedation was personally administered by an       anesthesia professional. The following parameters were monitored: oxygen       saturation, heart rate, blood pressure, respiratory rate, EKG, adequacy       of pulmonary ventilation, and response to care. Recommendation:           - Patient has a contact number available for                            emergencies. The signs and symptoms of potential                            delayed complications were discussed with the                            patient. Return to normal activities  tomorrow.                            Written discharge instructions were provided to the                            patient.                           - Advance diet as tolerated. Follow-up on                            pathology. Timing of repeat EGD to be determined.                            See colonoscopy report. Office visit with [place] in 6                            weeks. See colonoscopy report. 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 All rights reserved. The codes documented in this report are preliminary  and upon coder review may  be revised to meet current compliance requirements. [name]. Dorance Spink, MD Norvel Richards, MD 09/16/2022 8:36:21 AM This report has been signed electronically. Number of Addenda: 0

## 2022-09-16 NOTE — Interval H&P Note (Signed)
History and Physical Interval Note:  09/16/2022 7:39 AM  Craig Scott  has presented today for surgery, with the diagnosis of surveillance.  The various methods of treatment have been discussed with the patient and family. After consideration of risks, benefits and other options for treatment, the patient has consented to  Procedure(s) with comments: COLONOSCOPY WITH PROPOFOL (N/A) - 7:30 am as a surgical intervention.  The patient's history has been reviewed, patient examined, no change in status, stable for surgery.  I have reviewed the patient's chart and labs.  Questions were answered to the patient's satisfaction.     [name]  Patient notes chronic diarrhea.  No dysphagia.  Continues to consume large quantities of beer.  Has episodes of incontinence.  No rectal bleeding.  He asked that we perform an EGD at the time of colonoscopy today.  He is not really having any upper GI tract symptoms such as dysphagia or early satiety or nausea or vomiting however, does have Lynch syndrome and is not unreasonable.  Findings of prior EGD reviewed.  Therefore, I will offer him both EGD and a colonoscopy today. The risks, benefits, limitations, imponderables and alternatives regarding both EGD and colonoscopy have been reviewed with the patient. Questions have been answered. All parties agreeable.

## 2022-09-16 NOTE — Discharge Instructions (Addendum)
Colonoscopy Discharge Instructions  Read the instructions outlined below and refer to this sheet in the next few weeks. These discharge instructions provide you with general information on caring for yourself after you leave the hospital. Your doctor may also give you specific instructions. While your treatment has been planned according to the most current medical practices available, unavoidable complications occasionally occur. If you have any problems or questions after discharge, call Dr. Gala Romney at [phone]. ACTIVITY You may resume your regular activity, but move at a slower pace for the next 24 hours.  Take frequent rest periods for the next 24 hours.  Walking will help get rid of the air and reduce the bloated feeling in your belly (abdomen).  No driving for 24 hours (because of the medicine (anesthesia) used during the test).   Do not sign any important legal documents or operate any machinery for 24 hours (because of the anesthesia used during the test).  NUTRITION Drink plenty of fluids.  You may resume your normal diet as instructed by your doctor.  Begin with a light meal and progress to your normal diet. Heavy or fried foods are harder to digest and may make you feel sick to your stomach (nauseated).  Avoid alcoholic beverages for 24 hours or as instructed.  MEDICATIONS You may resume your normal medications unless your doctor tells you otherwise.  WHAT YOU CAN EXPECT TODAY Some feelings of bloating in the abdomen.  Passage of more gas than usual.  Spotting of blood in your stool or on the toilet paper.  IF YOU HAD POLYPS REMOVED DURING THE COLONOSCOPY: No aspirin products for 7 days or as instructed.  No alcohol for 7 days or as instructed.  Eat a soft diet for the next 24 hours.  FINDING OUT THE RESULTS OF YOUR TEST Not all test results are available during your visit. If your test results are not back during the visit, make an appointment with your caregiver to find out the  results. Do not assume everything is normal if you have not heard from your caregiver or the medical facility. It is important for you to follow up on all of your test results.  SEEK IMMEDIATE MEDICAL ATTENTION IF: You have more than a spotting of blood in your stool.  Your belly is swollen (abdominal distention).  You are nauseated or vomiting.  You have a temperature over 101.  You have abdominal pain or discomfort that is severe or gets worse throughout the day.   EGD Discharge instructions Please read the instructions outlined below and refer to this sheet in the next few weeks. These discharge instructions provide you with general information on caring for yourself after you leave the hospital. Your doctor may also give you specific instructions. While your treatment has been planned according to the most current medical practices available, unavoidable complications occasionally occur. If you have any problems or questions after discharge, please call your doctor. ACTIVITY You may resume your regular activity but move at a slower pace for the next 24 hours.  Take frequent rest periods for the next 24 hours.  Walking will help expel (get rid of) the air and reduce the bloated feeling in your abdomen.  No driving for 24 hours (because of the anesthesia (medicine) used during the test).  You may shower.  Do not sign any important legal documents or operate any machinery for 24 hours (because of the anesthesia used during the test).  NUTRITION Drink plenty of fluids.  You may  resume your normal diet.  Begin with a light meal and progress to your normal diet.  Avoid alcoholic beverages for 24 hours or as instructed by your caregiver.  MEDICATIONS You may resume your normal medications unless your caregiver tells you otherwise.  WHAT YOU CAN EXPECT TODAY You may experience abdominal discomfort such as a feeling of fullness or "gas" pains.  FOLLOW-UP Your doctor will discuss the results of  your test with you.  SEEK IMMEDIATE MEDICAL ATTENTION IF ANY OF THE FOLLOWING OCCUR: Excessive nausea (feeling sick to your stomach) and/or vomiting.  Severe abdominal pain and distention (swelling).  Trouble swallowing.  Temperature over 101 F (37.8 C).  Rectal bleeding or vomiting of blood.     You have a duodenal ulcer.  NSAIDs like Advil, Aleve and Celebrex can all produce ulcers like this.  I recommend you avoid these medications.  To heal the ulcer, we will begin pantoprazole or Protonix 40 mg orally before breakfast and supper for 3 months (prescription to be sent to your pharmacy from the office)   2 polyps removed from your colon.  He had mild diverticulosis.   I strongly recommend you stop drinking alcohol-all forms.     Office visit with me in 6 weeks   further recommendations to follow.  At Patient request I called Craig Scott at [phone] -  reviewed findings and recommendations

## 2022-09-16 NOTE — Op Note (Signed)
[facility] Patient Name: Craig Scott Procedure Date: 09/16/2022 7:05 AM MRN: [id number] Date of Birth: [birth year] Attending MD: Norvel Richards , MD, [id number] CSN: [id number] Age: 67 Admit Type: Outpatient Procedure:                Colonoscopy Indications:              Colon cancer screening in patient at increased                            risk: Family history of hereditary nonpolyposis                            colorectal cancer Providers:                Norvel Richards, MD, Janeece Riggers, RN, Dereck Leep, Technician Referring MD:              Medicines:                Propofol per Anesthesia Complications:            No immediate complications. Estimated Blood Loss:     Estimated blood loss was minimal. Procedure:                Pre-Anesthesia Assessment:                           - Prior to the procedure, a History and Physical                            was performed, and patient medications and                            allergies were reviewed. The patient's tolerance of                            previous anesthesia was also reviewed. The risks                            and benefits of the procedure and the sedation                            options and risks were discussed with the patient.                            All questions were answered, and informed consent                            was obtained. Prior Anticoagulants: The patient has                            taken no anticoagulant or antiplatelet agents. ASA  Grade Assessment: III - A patient with severe                            systemic disease. After reviewing the risks and                            benefits, the patient was deemed in satisfactory                            condition to undergo the procedure.                           After obtaining informed consent, the colonoscope                            was passed  under direct vision. Throughout the                            procedure, the patient's blood pressure, pulse, and                            oxygen saturations were monitored continuously. The                            [id number]) scope was introduced through the                            anus and advanced to the the cecum, identified by                            appendiceal orifice and ileocecal valve. The                            colonoscopy was performed without difficulty. The                            patient tolerated the procedure well. The quality                            of the bowel preparation was adequate. The                            ileocecal valve, appendiceal orifice, and rectum                            were photographed. The ileocecal valve, appendiceal                            orifice, and rectum were photographed. The entire                            colon was well visualized. Scope In: 7:48:12 AM Scope Out: 8:03:00 AM Scope Withdrawal Time: 0 hours 9 minutes 35 seconds  Total Procedure Duration: 0 hours 14  minutes 48 seconds  Findings:      The perianal and digital rectal examinations were normal.      Scattered small-mouthed diverticula were found in the sigmoid colon and       descending colon. (2) 5 mm polyps?"1 in the cecum 1 in the ascending       segment. Cold snare removed. Biopsies of the right and left colon to       further evaluate chronic diarrhea      Grade 1 internal hemorrhoids. Otherwise negative retroflexion. Exam       otherwise negative. Impression:               - Diverticulosis in the sigmoid colon and in the                            descending colon. Diminutive polyps removed as                            described above. Status post segmental biopsy.                            Internal hemorrhoids. Moderate Sedation:      Moderate (conscious) sedation was personally administered by an       anesthesia professional.  The following parameters were monitored: oxygen       saturation, heart rate, blood pressure, respiratory rate, EKG, adequacy       of pulmonary ventilation, and response to care. Recommendation:           - Patient has a contact number available for                            emergencies. The signs and symptoms of potential                            delayed complications were discussed with the                            patient. Return to normal activities tomorrow.                            Written discharge instructions were provided to the                            patient.                           - Advance diet as tolerated                           - Continue present medications.                           - Repeat colonoscopy date to be determined after                            pending pathology results are reviewed for  surveillance.                           - Return to GI clinic in 6 weeks. Procedure Code(s):        --- Professional ---                           [phone], Colonoscopy, flexible; diagnostic, including                            collection of specimen(s) by brushing or washing,                            when performed (separate procedure) Diagnosis Code(s):        --- Professional ---                           Z80.0, Family history of malignant neoplasm of                            digestive organs                           K57.30, Diverticulosis of large intestine without                            perforation or abscess without bleeding CPT copyright 2022 American [employer] Association. All rights reserved. The codes documented in this report are preliminary and upon coder review may  be revised to meet current compliance requirements. [name]. [name], MD Norvel Richards, MD 09/16/2022 8:30:28 AM This report has been signed electronically. Number of Addenda: 0

## 2022-09-16 NOTE — Telephone Encounter (Signed)
-----   Message from Daneil Dolin, MD sent at 09/16/2022  9:42 AM EST -----   Patient needs new prescription pantoprazole 40 mg before meals twice daily x 3 months.  Dispense 60 with 3 refills

## 2022-09-16 NOTE — H&P (Signed)
_0 @   Primary Care Physician:  Redmond School, MD Primary Gastroenterologist:  Dr. Gala Romney  Pre-Procedure History & Physical: HPI:  Craig Scott is a 67 y.o. male here for surveillance colonoscopy.  History of Lynch syndrome.  Personal history of colonic adenomas.  Multiple first-degree relatives with colon cancer.  Adenoma last colonoscopy a little over 1 year ago.  Patient devoid of any GI symptoms at this time.  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 16 yrs ago   Tubular adenoma    Urinary frequency    Wears glasses     Past Surgical History:  Procedure Laterality Date   BASAL CELL CARCINOMA EXCISION     BIOPSY  09/04/2021   Procedure: BIOPSY;  Surgeon: Daneil Dolin, MD;  [facility];  Service: Endoscopy;;   BUNIONECTOMY WITH HAMMERTOE RECONSTRUCTION AND GASTROC SLIDE Right 06/27/2015   Procedure: RIGHT GASTROC RECESSION/POSTERIOR TIBIAL TENOLYSIS/SECOND AND THIRD METATARSAL WEIL OSTEOTOMY AND HAMMERTOE CORRECTION;  Surgeon: Wylene Simmer, MD;  Location: [place];  Service: Orthopedics;  Laterality: Right;   CALCANEAL OSTEOTOMY Right 06/27/2015   Procedure: CALCANEAL OSTEOTOMY;  Surgeon: Wylene Simmer, MD;  Location: [place];  Service: Orthopedics;  Laterality: Right;   COLONOSCOPY  12/09/2001   RMR: Normal normal rectum/ A few scattered left-sided diverticula.  The remainder of the colonic  mucosa appeared normal   COLONOSCOPY  12/15/2002   RMR: Normal rectum, scattered left-sided  diverticula.  The remainder of the colonic mucosa appeared normal   COLONOSCOPY  04/10/2004   RMR: Normal rectum/Sigmoid diverticula/The remainder of the colonic mucosa appeared normal   COLONOSCOPY  12/23/2005   RMR: Normal rectum, scattered sigmoid diverticula Colonic mucosa appeared normal   COLONOSCOPY  01/20/2008   RMR: Distal diminutive rectal polyps, status post cold biopsy removal  otherwise, normal rectum/ biopsy removal; scattered sigmoid diverticula; and benign-appearing/ulcers about the ileocecal valve, status post biopsy.  Remainder of colonic mucosa and terminal mucosa appeared normal.   COLONOSCOPY  05/22/2010   RMR: distal diminutive rectal polyp s/p bx otherwise normal/few scattered pancolonic diverticula   COLONOSCOPY  09/16/2012   Dr.Uriyah Massimo- normal rectum, few scattered pancolonic diverticulosis, one diminutive polyp in the base of the cecum. 3x3 area of hyper pigmentation i the mid descending segment, this was a flat benign-appearing area o/w the remainder of the colonic mucosa appeared normal. bx= tubular adenoma and benign colonic mucosa   COLONOSCOPY WITH PROPOFOL N/A 09/05/2015   Procedure: COLONOSCOPY WITH PROPOFOL;  Surgeon: Daneil Dolin, MD;  [facility];  Service: Endoscopy;  Laterality: N/A;  0830   COLONOSCOPY WITH PROPOFOL N/A 05/24/2017   Procedure: COLONOSCOPY WITH PROPOFOL;  Surgeon: Daneil Dolin, MD;  [facility];  Service: Endoscopy;  Laterality: N/A;   COLONOSCOPY WITH PROPOFOL N/A 09/04/2021   Procedure: COLONOSCOPY WITH PROPOFOL;  Surgeon: [name]  M, MD;  [facility];  Service: Endoscopy;  Laterality: N/A;  1:15 / ASA III-DO NOT MOVE. Rep is coming for EXALT ERCP scope.   colonscopy  09/04/2021   @ [place]   CYSTOSCOPY WITH BIOPSY N/A 09/12/2021   Procedure: CYSTOSCOPY WITH BLADDERBIOPSY/ FULGURATION/ INSTILLATION OF MARCAINE/PYRIDIUM;  Surgeon: Festus Aloe, MD;  Location: [facility]   Service: Urology;  Laterality: N/A;   ESOPHAGOGASTRODUODENOSCOPY  12/23/2005   RMR:   Esophagogastric peptic stricture with reflux esophagitis, status post  dilation as described above/ Small hiatal hernia, otherwise normal stomach.  Bulbar erosions otherwise normal D1 and D2   ESOPHAGOGASTRODUODENOSCOPY (EGD) WITH PROPOFOL  09/04/2021   Procedure: ESOPHAGOGASTRODUODENOSCOPY (EGD) WITH PROPOFOL;  Surgeon: Daneil Dolin, MD;  [facility];  Service: Endoscopy;;   EYE SURGERY  as child   FOOT ARTHRODESIS Right 06/17/2017   Procedure: Right Talonavicular and Subtalar Arthrodesis;  Surgeon: Wylene Simmer, MD;  Location: [place];  Service: Orthopedics;  Laterality: Right;   HARDWARE REMOVAL Right 06/25/2016   Procedure: REMOVAL OF DEEP IMPLANTS RIGHT MEDIAL CUNEIFORM, RIGHT CALCANEAL REMOVAL OF DEEP IMPLANTS;  Surgeon: Wylene Simmer, MD;  Location: [place];  Service: Orthopedics;  Laterality: Right;   JOINT REPLACEMENT  09/30/2010   Right knee   KNEE SURGERY Right    bilat    NOSE SURGERY Left    cartiledge removal   POLYPECTOMY  09/05/2015   Procedure: POLYPECTOMY;  Surgeon: Daneil Dolin, MD;  [facility];  Service: Endoscopy;;   POLYPECTOMY  05/24/2017   Procedure: POLYPECTOMY;  Surgeon: Daneil Dolin, MD;  [facility];  Service: Endoscopy;;  colon   POLYPECTOMY  09/04/2021   Procedure: POLYPECTOMY;  Surgeon: Daneil Dolin, MD;  [facility];  Service: Endoscopy;;   RHINOPLASTY     TOTAL HIP ARTHROPLASTY Left 03/20/2015   Procedure: LEFT TOTAL HIP ARTHROPLASTY ANTERIOR APPROACH;  Surgeon: Gaynelle Arabian, MD;  [facility];  Service: Orthopedics;  Laterality: Left;   TOTAL KNEE ARTHROPLASTY Left 10/23/2013   Procedure: LEFT TOTAL KNEE ARTHROPLASTY;  Surgeon: [name], MD;  [facility];  Service: Orthopedics;  Laterality: Left;   TYMPANOSTOMY TUBE PLACEMENT Right    early 2014 and multiple  times   TYMPANOSTOMY TUBE PLACEMENT      Prior to Admission medications   Medication Sig Start Date End Date Taking? Authorizing Provider  ALPRAZolam Duanne Moron) 1 MG tablet Take 1 mg by mouth 4 (four) times daily as needed for anxiety.   Yes [provider]  aspirin EC 81 MG tablet Take 1 tablet (81 mg total) by mouth 2 (two) times daily. Patient taking differently: Take 81 mg by mouth daily. 06/17/17  Yes Corky Sing, PA-C  celecoxib (CELEBREX) 200 MG capsule Take 200 mg by mouth daily.   Yes [provider]  cetirizine (ZYRTEC) 10 MG tablet Take 10 mg by mouth daily.   Yes [provider]  Cholecalciferol (VITAMIN D3) 125 MCG (5000 UT) CAPS Take 5,000 Units by mouth daily.   Yes [provider]  donepezil (ARICEPT) 5 MG tablet Take 5 mg by mouth at bedtime. 04/14/22  Yes [provider]  escitalopram (LEXAPRO) 10 MG tablet Take 10 mg by mouth daily. 04/14/22  Yes [provider]  HUMIRA PEN 40 MG/0.4ML PNKT Inject 40 mg into the skin every 14 (fourteen) days. 06/06/21  Yes [provider]  Misc Natural  Products (GLUCOSAMINE CHOND MSM FORMULA) TABS Take 1 tablet by mouth daily.   Yes [provider]  Potassium 99 MG TABS Take 99 mg by mouth daily.   Yes [provider]  simvastatin (ZOCOR) 20 MG tablet Take 20 mg by mouth at bedtime.   Yes [provider]  zinc gluconate 50 MG tablet Take 50 mg by mouth daily.   Yes [provider]  Sod Picosulfate-Mag Ox-Cit Acd (CLENPIQ) 10-3.5-12 MG-GM -GM/175ML SOLN Take 1 kit by mouth as directed. 08/12/22   Daneil Dolin, MD    Allergies as of 08/12/2022 - Review Complete 06/30/2022  Allergen Reaction Noted   Doxycycline  06/30/2022   Lyrica [pregabalin] Other (See Comments) 03/14/2015    Family History  Problem Relation Age of Onset   Colon cancer Brother        19    Colon cancer Brother        2   Colon cancer Mother        age 18     Dementia Mother    Arthritis Mother    Heart attack Father    Colon cancer Cousin        83 years old onset   Colon cancer Cousin        15 years onset    Social History   Socioeconomic History   Marital status: Married    Spouse name: Not on file   Number of children: 2   Years of education: 12   Highest education level: Not on file  Occupational History   Occupation: [employer] manager: [employer]    Comment: [employer]  Tobacco Use   Smoking status: Former    Packs/day: 0.50    Years: 30.00    Total pack years: 15.00    Types: Cigars, Cigarettes    Quit date: 09/14/2005    Years since quitting: 17.0   Smokeless tobacco: Never  Vaping Use   Vaping Use: Never used  Substance and Sexual Activity   Alcohol use: Yes    Comment: 8-9 beers/day; some days drinks more   Drug use: Not Currently    Comment: no marijuana use in 25 yrs per pt on 09-09-2021   Sexual activity: Yes    Birth control/protection: None  Other Topics Concern   Not on file  Social History Narrative   ** Merged History Encounter **       Social Determinants of Health   Financial Resource Strain: Not on file  Food Insecurity: Not on file  Transportation Needs: Not on file  Physical Activity: Not on file  Stress: Not on file  Social Connections: Not on file  Intimate Partner Violence: Not on file    Review of Systems: See HPI, otherwise negative ROS  Physical Exam: BP (!) 150/88   Pulse 77   Temp 98.2 F (36.8 C) (Oral)   Resp 18   SpO2 99%  General:   Alert,  Well-developed, well-nourished, pleasant and cooperative in NAD SNeck:  Supple; no masses or thyromegaly. No significant cervical adenopathy. Lungs:  Clear throughout to auscultation.   No wheezes, crackles, or rhonchi. No acute distress. Heart:  Regular rate and rhythm; no murmurs, clicks, rubs,  or gallops. Abdomen: Non-distended, normal bowel sounds.  Soft and nontender without appreciable mass or hepatosplenomegaly.  Pulses:   Normal pulses noted. Extremities:  Without clubbing or edema.  Impression/Plan: 67 year old gentleman with Lynch syndrome personal history colonic polyps and positive  family history colon cancer here for high risk screening/surveillance per plan.  I will offer the patient a colonoscopy today.  The risks, benefits, limitations, alternatives and imponderables have been reviewed with the patient. Questions have been answered. All parties are agreeable.    Blood pressure up today will be rechecked.   Notice: This dictation was prepared with Dragon dictation along with smaller phrase technology. Any transcriptional errors that result from this process are unintentional and may not be corrected upon review.

## 2022-09-16 NOTE — Anesthesia Postprocedure Evaluation (Signed)
Anesthesia Post Note  Patient: Craig Scott  Procedure(s) Performed: COLONOSCOPY WITH PROPOFOL ESOPHAGOGASTRODUODENOSCOPY (EGD) WITH PROPOFOL POLYPECTOMY BIOPSY  Patient location during evaluation: PACU Anesthesia Type: General Level of consciousness: awake and alert Pain management: pain level controlled Vital Signs Assessment: post-procedure vital signs reviewed and stable Respiratory status: spontaneous breathing, nonlabored ventilation, respiratory function stable and patient connected to nasal cannula oxygen Cardiovascular status: blood pressure returned to baseline and stable Postop Assessment: no apparent nausea or vomiting Anesthetic complications: no   No notable events documented.   Last Vitals:  Vitals:   09/16/22 0633 09/16/22 0827  BP: (!) 150/88 117/75  Pulse: 77 61  Resp: 18   Temp: 36.8 C (!) 36.4 C  SpO2: 99% 96%    Last Pain:  Vitals:   09/16/22 0827  TempSrc: Oral  PainSc: 0-No pain                 Nicanor Alcon

## 2022-09-17 ENCOUNTER — Encounter: Payer: Self-pay | Admitting: Internal Medicine

## 2022-09-17 LAB — SURGICAL PATHOLOGY

## 2022-09-22 ENCOUNTER — Encounter (HOSPITAL_COMMUNITY): Payer: Self-pay | Admitting: Internal Medicine

## 2022-10-02 DIAGNOSIS — R7989 Other specified abnormal findings of blood chemistry: Secondary | ICD-10-CM | POA: Diagnosis not present

## 2022-10-02 DIAGNOSIS — F419 Anxiety disorder, unspecified: Secondary | ICD-10-CM | POA: Diagnosis not present

## 2022-10-02 DIAGNOSIS — I700 Atherosclerosis of aorta: Secondary | ICD-10-CM | POA: Diagnosis not present

## 2022-10-02 DIAGNOSIS — M069 Rheumatoid arthritis, unspecified: Secondary | ICD-10-CM | POA: Diagnosis not present

## 2022-10-02 DIAGNOSIS — E6609 Other obesity due to excess calories: Secondary | ICD-10-CM | POA: Diagnosis not present

## 2022-10-02 DIAGNOSIS — K219 Gastro-esophageal reflux disease without esophagitis: Secondary | ICD-10-CM | POA: Diagnosis not present

## 2022-10-02 DIAGNOSIS — Z6833 Body mass index (BMI) 33.0-33.9, adult: Secondary | ICD-10-CM | POA: Diagnosis not present

## 2022-10-06 DIAGNOSIS — M1991 Primary osteoarthritis, unspecified site: Secondary | ICD-10-CM | POA: Diagnosis not present

## 2022-10-06 DIAGNOSIS — M0579 Rheumatoid arthritis with rheumatoid factor of multiple sites without organ or systems involvement: Secondary | ICD-10-CM | POA: Diagnosis not present

## 2022-10-06 DIAGNOSIS — E669 Obesity, unspecified: Secondary | ICD-10-CM | POA: Diagnosis not present

## 2022-10-06 DIAGNOSIS — Z6832 Body mass index (BMI) 32.0-32.9, adult: Secondary | ICD-10-CM | POA: Diagnosis not present

## 2022-10-08 DIAGNOSIS — D100 Benign neoplasm of lip: Secondary | ICD-10-CM | POA: Diagnosis not present

## 2022-10-13 DIAGNOSIS — R413 Other amnesia: Secondary | ICD-10-CM | POA: Diagnosis not present

## 2022-10-13 DIAGNOSIS — G3184 Mild cognitive impairment, so stated: Secondary | ICD-10-CM | POA: Diagnosis not present

## 2022-10-20 ENCOUNTER — Ambulatory Visit (INDEPENDENT_AMBULATORY_CARE_PROVIDER_SITE_OTHER): Payer: Medicare PPO | Admitting: Internal Medicine

## 2022-10-20 VITALS — BP 137/83 | HR 64 | Temp 98.1°F | Ht 71.0 in | Wt 246.4 lb

## 2022-10-20 DIAGNOSIS — K219 Gastro-esophageal reflux disease without esophagitis: Secondary | ICD-10-CM

## 2022-10-20 DIAGNOSIS — Z86010 Personal history of colonic polyps: Secondary | ICD-10-CM

## 2022-10-20 DIAGNOSIS — Z1509 Genetic susceptibility to other malignant neoplasm: Secondary | ICD-10-CM | POA: Diagnosis not present

## 2022-10-20 DIAGNOSIS — D100 Benign neoplasm of lip: Secondary | ICD-10-CM | POA: Diagnosis not present

## 2022-10-20 NOTE — Patient Instructions (Signed)
It was good to see you again today exclamation mark  Congratulations on cutting back on smoking products and alcohol  Continue to avoid Celebrex and other similar medicines like Advil and ibuprofen  Protonix 40 mg twice daily 30 minutes before supper and breakfast  Plan for colonoscopy in 1 year.  Also, glad to know diarrhea is much better.  You reported that it comes back with ingestion of ice cream.  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  You can buy Lactaid, an enzyme supplement at the drugstore for occasional ice cream treats  Will see you in 1 year for repeat colonoscopy.  We will shoot for a 730 appointment time

## 2022-10-20 NOTE — Progress Notes (Signed)
Primary Care Physician:  Redmond School, MD Primary Gastroenterologist:  Dr. Gala Romney  Pre-Procedure History & Physical: HPI:  Craig Scott is a 67 y.o. male  with Lynch syndrome here for follow-up recent high risk surveillance colonoscopy demonstrated adenomas removed no evidence of microscopic colitis (biopsied for chronic diarrhea).  1 biopsy specimen suggested sessile serrated polyp.  No visual indications of anything more high-grade.  Diarrhea has subsided with cessation of Celebrex and large quantities of beer he was consuming on a daily basis.  He just stopped drinking and stopped using NSAIDs.  Also, no longer smokes anything.  He is dealing with his wife who has health issues.  He is not passing any blood.  He is due for colonoscopy in 11 months. He does note loose stools with dairy products.   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 16 yrs ago   Tubular adenoma    Urinary frequency    Wears glasses     Past Surgical History:  Procedure Laterality Date   BASAL CELL CARCINOMA EXCISION     BIOPSY  09/04/2021   Procedure: BIOPSY;  Surgeon: Daneil Dolin, MD;  [facility];  Service: Endoscopy;;   BIOPSY  09/16/2022   Procedure: BIOPSY;  Surgeon: Daneil Dolin, MD;  [facility];  Service: Endoscopy;;   BUNIONECTOMY WITH HAMMERTOE RECONSTRUCTION AND GASTROC SLIDE Right 06/27/2015   Procedure: RIGHT GASTROC RECESSION/POSTERIOR TIBIAL TENOLYSIS/SECOND AND THIRD METATARSAL WEIL OSTEOTOMY AND HAMMERTOE CORRECTION;   Surgeon: Wylene Simmer, MD;  Location: [place];  Service: Orthopedics;  Laterality: Right;   CALCANEAL OSTEOTOMY Right 06/27/2015   Procedure: CALCANEAL OSTEOTOMY;  Surgeon: Wylene Simmer, MD;  Location: [place];  Service: Orthopedics;  Laterality: Right;   COLONOSCOPY  12/09/2001   RMR: Normal normal rectum/ A few scattered left-sided diverticula.  The remainder of the colonic  mucosa appeared normal   COLONOSCOPY  12/15/2002   RMR: Normal rectum, scattered left-sided diverticula.  The remainder of the colonic mucosa appeared normal   COLONOSCOPY  04/10/2004   RMR: Normal rectum/Sigmoid diverticula/The remainder of the colonic mucosa appeared normal   COLONOSCOPY  12/23/2005   RMR: Normal rectum, scattered sigmoid diverticula Colonic mucosa appeared normal   COLONOSCOPY  01/20/2008   RMR: Distal diminutive rectal polyps, status post cold biopsy removal  otherwise, normal rectum/ biopsy removal; scattered sigmoid diverticula; and benign-appearing/ulcers about the ileocecal valve, status post biopsy.  Remainder of colonic mucosa and terminal mucosa appeared normal.   COLONOSCOPY  05/22/2010   RMR: distal diminutive rectal polyp s/p bx otherwise normal/few scattered pancolonic diverticula   COLONOSCOPY  09/16/2012   Dr.Elwanda Moger- normal rectum, few scattered pancolonic diverticulosis, one diminutive polyp in the base of the cecum. 3x3 area of hyper pigmentation i the mid descending segment, this was a flat benign-appearing area o/w the remainder of  the colonic mucosa appeared normal. bx= tubular adenoma and benign colonic mucosa   COLONOSCOPY WITH PROPOFOL N/A 09/05/2015   Procedure: COLONOSCOPY WITH PROPOFOL;  Surgeon: Daneil Dolin, MD;  [facility];  Service: Endoscopy;  Laterality: N/A;  0830   COLONOSCOPY WITH PROPOFOL N/A 05/24/2017   Procedure: COLONOSCOPY WITH PROPOFOL;  Surgeon: Daneil Dolin, MD;  [facility];  Service: Endoscopy;   Laterality: N/A;   COLONOSCOPY WITH PROPOFOL N/A 09/04/2021   Procedure: COLONOSCOPY WITH PROPOFOL;  Surgeon: Daneil Dolin, MD;  [facility];  Service: Endoscopy;  Laterality: N/A;  1:15 / ASA III-DO NOT MOVE. Rep is coming for EXALT ERCP scope.   COLONOSCOPY WITH PROPOFOL N/A 09/16/2022   Procedure: COLONOSCOPY WITH PROPOFOL;  Surgeon: Daneil Dolin, MD;  [facility];  Service: Endoscopy;  Laterality: N/A;  7:30 am   colonscopy  09/04/2021   @ [place]   CYSTOSCOPY WITH BIOPSY N/A 09/12/2021   Procedure: CYSTOSCOPY WITH BLADDERBIOPSY/ FULGURATION/ INSTILLATION OF MARCAINE/PYRIDIUM;  Surgeon: Festus Aloe, MD;  Location: [facility];  Service: Urology;  Laterality: N/A;   ESOPHAGOGASTRODUODENOSCOPY  12/23/2005   RMR:   Esophagogastric peptic stricture with reflux esophagitis, status post  dilation as described above/ Small hiatal hernia, otherwise normal stomach.  Bulbar erosions otherwise normal D1 and D2   ESOPHAGOGASTRODUODENOSCOPY (EGD) WITH PROPOFOL  09/04/2021   Procedure: ESOPHAGOGASTRODUODENOSCOPY (EGD) WITH PROPOFOL;  Surgeon: Daneil Dolin, MD;  [facility];  Service: Endoscopy;;   ESOPHAGOGASTRODUODENOSCOPY (EGD) WITH PROPOFOL  09/16/2022   Procedure: ESOPHAGOGASTRODUODENOSCOPY (EGD) WITH PROPOFOL;  Surgeon: Daneil Dolin, MD;  [facility];  Service: Endoscopy;;   EYE SURGERY  as child   FOOT ARTHRODESIS Right 06/17/2017   Procedure: Right Talonavicular and Subtalar Arthrodesis;  Surgeon: Wylene Simmer, MD;  Location: [place];  Service: Orthopedics;  Laterality: Right;   HARDWARE REMOVAL Right 06/25/2016   Procedure: REMOVAL OF DEEP IMPLANTS RIGHT MEDIAL CUNEIFORM, RIGHT CALCANEAL REMOVAL OF DEEP IMPLANTS;  Surgeon: Wylene Simmer, MD;  Location: [place];  Service: Orthopedics;  Laterality: Right;   JOINT REPLACEMENT  09/30/2010   Right knee   KNEE SURGERY Right    bilat     NOSE SURGERY Left    cartiledge removal   POLYPECTOMY  09/05/2015   Procedure: POLYPECTOMY;  Surgeon: Daneil Dolin, MD;  [facility];  Service: Endoscopy;;   POLYPECTOMY  05/24/2017   Procedure: POLYPECTOMY;  Surgeon: Daneil Dolin, MD;  [facility];  Service: Endoscopy;;  colon   POLYPECTOMY  09/04/2021   Procedure: POLYPECTOMY;  Surgeon: Daneil Dolin, MD;  [facility];  Service: Endoscopy;;   POLYPECTOMY  09/16/2022   Procedure: POLYPECTOMY;  Surgeon: Daneil Dolin, MD;  [facility];  Service: Endoscopy;;   RHINOPLASTY     TOTAL HIP ARTHROPLASTY Left 03/20/2015   Procedure: LEFT TOTAL HIP ARTHROPLASTY ANTERIOR APPROACH;  Surgeon: Gaynelle Arabian, MD;  [facility];  Service: Orthopedics;  Laterality: Left;   TOTAL KNEE ARTHROPLASTY Left 10/23/2013   Procedure: LEFT TOTAL KNEE ARTHROPLASTY;  Surgeon: [name], MD;  [facility];  Service: Orthopedics;  Laterality: Left;   TYMPANOSTOMY TUBE PLACEMENT Right    early 2014 and multiple times   TYMPANOSTOMY TUBE PLACEMENT      Prior to Admission medications   Medication Sig Start Date End Date Taking? Authorizing Provider  ALPRAZolam (  XANAX) 1 MG tablet Take 1 mg by mouth 4 (four) times daily as needed for anxiety.   Yes [provider]  aspirin EC 81 MG tablet Take 1 tablet (81 mg total) by mouth 2 (two) times daily. Patient taking differently: Take 81 mg by mouth daily. 06/17/17  Yes Corky Sing, PA-C  celecoxib (CELEBREX) 200 MG capsule Take 200 mg by mouth daily.   Yes [provider]  cetirizine (ZYRTEC) 10 MG tablet Take 10 mg by mouth daily.   Yes [provider]  Cholecalciferol (VITAMIN D3) 125 MCG (5000 UT) CAPS Take 5,000 Units by mouth daily.   Yes [provider]  donepezil (ARICEPT) 5 MG tablet Take 5 mg by mouth at bedtime. 04/14/22  Yes [provider]  escitalopram (LEXAPRO) 10 MG tablet Take 10 mg by mouth  daily. 04/14/22  Yes [provider]  HUMIRA PEN 40 MG/0.4ML PNKT Inject 40 mg into the skin every 14 (fourteen) days. 06/06/21  Yes [provider]  Misc Natural Products ([place] MSM FORMULA) TABS Take 1 tablet by mouth daily.   Yes [provider]  pantoprazole (PROTONIX) 40 MG tablet Take 1 tablet (40 mg total) by mouth 2 (two) times daily before a meal. 09/16/22  Yes Calianne Larue, [name], MD  Potassium 99 MG TABS Take 99 mg by mouth daily.   Yes [provider]  simvastatin (ZOCOR) 20 MG tablet Take 20 mg by mouth at bedtime.   Yes [provider]  zinc gluconate 50 MG tablet Take 50 mg by mouth daily.   Yes [provider]    Allergies as of 10/20/2022 - Review Complete 09/16/2022  Allergen Reaction Noted   Doxycycline  06/30/2022   Lyrica [pregabalin] Other (See Comments) 03/14/2015    Family History  Problem Relation Age of Onset   Colon cancer Brother        38    Colon cancer Brother        78   Colon cancer Mother        age 82    Dementia Mother    Arthritis Mother    Heart attack Father    Colon cancer Cousin        64 years old onset   Colon cancer Cousin        76 years onset    Social History   Socioeconomic History   Marital status: Married    Spouse name: Not on file   Number of children: 2   Years of education: 12   Highest education level: Not on file  Occupational History   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: [employer]    Comment: [employer]  Tobacco Use   Smoking status: Former    Packs/day: 0.50    Years: 30.00    Total pack years: 15.00    Types: Cigars, Cigarettes    Quit date: 09/14/2005    Years since quitting: 17.1   Smokeless tobacco: Never  Vaping Use   Vaping Use: Never used  Substance and Sexual Activity   Alcohol use: Yes    Comment: 8-9 beers/day; some days drinks more   Drug use: Not Currently    Comment: no marijuana use in 25 yrs per pt on 09-09-2021   Sexual activity:  Yes    Birth control/protection: None  Other Topics Concern   Not on file  Social History Narrative   ** Merged History Encounter **  Social Determinants of Health   Financial Resource Strain: Not on file  Food Insecurity: Not on file  Transportation Needs: Not on file  Physical Activity: Not on file  Stress: Not on file  Social Connections: Not on file  Intimate Partner Violence: Not on file    Review of Systems: See HPI, otherwise negative ROS  Physical Exam: BP 137/83 (BP Location: Left Arm, Patient Position: Sitting, Cuff Size: Large)   Pulse 64   Temp 98.1 F (36.7 C) (Oral)   Ht '5\' 11"'$  (1.803 m)   Wt 246 lb 6.4 oz (111.8 kg)   SpO2 93%   BMI 34.37 kg/m  General:   Alert,   pleasant and cooperative in NAD Neck:  Supple; no masses or thyromegaly. No significant cervical adenopathy. Lungs:  Clear throughout to auscultation.   No wheezes, crackles, or rhonchi. No acute distress. Heart:  Regular rate and rhythm; no murmurs, clicks, rubs,  or gallops. Abdomen: Non-distended, normal bowel sounds.  Soft and nontender without appreciable mass or hepatosplenomegaly.  Pulses:  Normal pulses noted. Extremities:  Without clubbing or edema.  Impression/Plan: Lynch with tubular adenomas removed recently.  Diarrhea resolved with drop back on large amounts of alcohol consumption.  Duodenal ulcer secondary to NSAID use.  Clinically, doing well on Protonix 40 mg twice daily.  He was commended on alcohol cessation.  Congratulations on cutting back on smoking products and alcohol  Continue to avoid Celebrex and other similar medicines like Advil and ibuprofen  Protonix 40 mg twice daily 30 minutes before supper and breakfast  Plan for colonoscopy in 1 year.  Lactase supplementation for any significant dairy exposure  Plan for a1 year for repeat colonoscopy.  We will shoot for a 730 appointment time  at patient request   Notice: This dictation was prepared with Dragon  dictation along with smaller phrase technology. Any transcriptional errors that result from this process are unintentional and may not be corrected upon review.

## 2022-11-05 DIAGNOSIS — Z125 Encounter for screening for malignant neoplasm of prostate: Secondary | ICD-10-CM | POA: Diagnosis not present

## 2022-11-05 DIAGNOSIS — E7849 Other hyperlipidemia: Secondary | ICD-10-CM | POA: Diagnosis not present

## 2022-11-06 DIAGNOSIS — R7989 Other specified abnormal findings of blood chemistry: Secondary | ICD-10-CM | POA: Diagnosis not present

## 2022-11-17 DIAGNOSIS — D239 Other benign neoplasm of skin, unspecified: Secondary | ICD-10-CM | POA: Diagnosis not present

## 2022-11-17 DIAGNOSIS — D485 Neoplasm of uncertain behavior of skin: Secondary | ICD-10-CM | POA: Diagnosis not present

## 2022-11-17 DIAGNOSIS — X32XXXA Exposure to sunlight, initial encounter: Secondary | ICD-10-CM | POA: Diagnosis not present

## 2022-11-17 DIAGNOSIS — L570 Actinic keratosis: Secondary | ICD-10-CM | POA: Diagnosis not present

## 2022-11-17 DIAGNOSIS — D225 Melanocytic nevi of trunk: Secondary | ICD-10-CM | POA: Diagnosis not present

## 2022-11-17 DIAGNOSIS — Z1283 Encounter for screening for malignant neoplasm of skin: Secondary | ICD-10-CM | POA: Diagnosis not present

## 2022-12-01 DIAGNOSIS — D225 Melanocytic nevi of trunk: Secondary | ICD-10-CM | POA: Diagnosis not present

## 2022-12-01 DIAGNOSIS — D485 Neoplasm of uncertain behavior of skin: Secondary | ICD-10-CM | POA: Diagnosis not present

## 2022-12-01 DIAGNOSIS — D239 Other benign neoplasm of skin, unspecified: Secondary | ICD-10-CM | POA: Diagnosis not present

## 2022-12-01 DIAGNOSIS — L910 Hypertrophic scar: Secondary | ICD-10-CM | POA: Diagnosis not present

## 2022-12-10 DIAGNOSIS — R7989 Other specified abnormal findings of blood chemistry: Secondary | ICD-10-CM | POA: Diagnosis not present

## 2022-12-11 DIAGNOSIS — M19071 Primary osteoarthritis, right ankle and foot: Secondary | ICD-10-CM | POA: Diagnosis not present

## 2022-12-11 DIAGNOSIS — M25571 Pain in right ankle and joints of right foot: Secondary | ICD-10-CM | POA: Diagnosis not present

## 2022-12-11 DIAGNOSIS — M79671 Pain in right foot: Secondary | ICD-10-CM | POA: Diagnosis not present

## 2022-12-11 DIAGNOSIS — T8484XA Pain due to internal orthopedic prosthetic devices, implants and grafts, initial encounter: Secondary | ICD-10-CM | POA: Diagnosis not present

## 2022-12-25 DIAGNOSIS — Z96653 Presence of artificial knee joint, bilateral: Secondary | ICD-10-CM | POA: Diagnosis not present

## 2022-12-25 DIAGNOSIS — Z96652 Presence of left artificial knee joint: Secondary | ICD-10-CM | POA: Diagnosis not present

## 2022-12-25 DIAGNOSIS — Z96642 Presence of left artificial hip joint: Secondary | ICD-10-CM | POA: Diagnosis not present

## 2022-12-25 DIAGNOSIS — Z96651 Presence of right artificial knee joint: Secondary | ICD-10-CM | POA: Diagnosis not present

## 2022-12-30 DIAGNOSIS — M0579 Rheumatoid arthritis with rheumatoid factor of multiple sites without organ or systems involvement: Secondary | ICD-10-CM | POA: Diagnosis not present

## 2022-12-30 DIAGNOSIS — Z6832 Body mass index (BMI) 32.0-32.9, adult: Secondary | ICD-10-CM | POA: Diagnosis not present

## 2022-12-30 DIAGNOSIS — M1991 Primary osteoarthritis, unspecified site: Secondary | ICD-10-CM | POA: Diagnosis not present

## 2022-12-30 DIAGNOSIS — E669 Obesity, unspecified: Secondary | ICD-10-CM | POA: Diagnosis not present

## 2022-12-30 DIAGNOSIS — M79671 Pain in right foot: Secondary | ICD-10-CM | POA: Diagnosis not present

## 2023-01-04 DIAGNOSIS — G3184 Mild cognitive impairment, so stated: Secondary | ICD-10-CM | POA: Diagnosis not present

## 2023-01-11 ENCOUNTER — Other Ambulatory Visit: Payer: Self-pay

## 2023-01-11 MED ORDER — PANTOPRAZOLE SODIUM 40 MG PO TBEC: 40.0000 mg | DELAYED_RELEASE_TABLET | Freq: Two times a day (BID) | ORAL | 3 refills | Status: DC

## 2023-02-05 DIAGNOSIS — M19071 Primary osteoarthritis, right ankle and foot: Secondary | ICD-10-CM | POA: Diagnosis not present

## 2023-02-15 ENCOUNTER — Ambulatory Visit: Payer: Medicare PPO | Admitting: Urology

## 2023-02-15 ENCOUNTER — Encounter: Payer: Self-pay | Admitting: Urology

## 2023-02-15 VITALS — BP 132/83 | HR 102

## 2023-02-15 DIAGNOSIS — N138 Other obstructive and reflux uropathy: Secondary | ICD-10-CM

## 2023-02-15 DIAGNOSIS — N5201 Erectile dysfunction due to arterial insufficiency: Secondary | ICD-10-CM

## 2023-02-15 DIAGNOSIS — N401 Enlarged prostate with lower urinary tract symptoms: Secondary | ICD-10-CM | POA: Diagnosis not present

## 2023-02-15 DIAGNOSIS — N281 Cyst of kidney, acquired: Secondary | ICD-10-CM

## 2023-02-15 DIAGNOSIS — R350 Frequency of micturition: Secondary | ICD-10-CM

## 2023-02-15 LAB — URINALYSIS, ROUTINE W REFLEX MICROSCOPIC
Bilirubin, UA: NEGATIVE
Glucose, UA: NEGATIVE
Ketones, UA: NEGATIVE
Leukocytes,UA: NEGATIVE
Nitrite, UA: NEGATIVE
Protein,UA: NEGATIVE
Specific Gravity, UA: 1.02 (ref 1.005–1.030)
Urobilinogen, Ur: 0.2 mg/dL (ref 0.2–1.0)
pH, UA: 7 (ref 5.0–7.5)

## 2023-02-15 LAB — MICROSCOPIC EXAMINATION: Bacteria, UA: NONE SEEN

## 2023-02-15 LAB — BLADDER SCAN AMB NON-IMAGING

## 2023-02-15 MED ORDER — TADALAFIL 5 MG PO TABS: 5.0000 mg | ORAL_TABLET | Freq: Every day | ORAL | 11 refills | Status: DC

## 2023-02-15 MED ORDER — TADALAFIL 5 MG PO TABS: 5.0000 mg | ORAL_TABLET | Freq: Every day | ORAL | 11 refills | Status: DC | PRN

## 2023-02-15 NOTE — Progress Notes (Unsigned)
02/15/2023 11:03 AM   Georgiana Shore [birth year] [id number]  Referring provider: Elfredia Nevins, MD [address] [place],  [place] [address]  Chief Complaint  Patient presents with   Urinary Frequency    HPI:  F/u -    1) cystitis/gross hematuria - he noted urine. MH on UA Oct 2022 - greater than 30 red blood cells. No bacteria. He does have Lynch syndrome. Remote smoker.  Office cystoscopy November 2022 revealed erythematous and ulcerated areas in his bladder.  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   Biopsy: December 2022- bladder bx - follicular cystitis, Benign urothelium and submucosa with prominent chronic inflammation, ulceration and telangiectasia    Imaging: Jan 2020 CT scan a/p reported with a 9 mm left renal lesion that needed further evaluation.  Aug 2022 - abd [place] [facility] reported as normal and 11 mm left renal cyst.  Oct 2022 CT a/p - benign. 9 mm left renal lesion/cyst stable.  Aug 2023 CT a/p - benign. 7 mm left renal lesion/cyst - smaller    2) BPH - patient with frequency. He has a good stream. He has not had prostate surgery and on no meds. PVR 49 ml.  October 2022 PSA 0.5.  Normal DRE December 2022. He complains of urinary frequency.  He does drink "a lot of beer and water".  AUASS 7 - better p bladder bx, fulguration.    3) ED-he takes sildenafil as needed    Today, seen for the above. He has a good stream but frequency and urgency. No caffeine. Worse after etoh. PVR 105 ml but he didn't void immediately prior. IPSS = 18. His Dec 2023 Cr 0.71. No gross hematuria.   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 16 yrs ago   Tubular adenoma    Urinary frequency    Wears glasses     Surgical History: Past Surgical History:  Procedure Laterality Date   BASAL CELL CARCINOMA EXCISION     BIOPSY  09/04/2021   Procedure: BIOPSY;  Surgeon: Corbin Ade, MD;  [facility];  Service: Endoscopy;;   BIOPSY  09/16/2022   Procedure: BIOPSY;  Surgeon: Corbin Ade, MD;  [facility];  Service: Endoscopy;;   BUNIONECTOMY WITH HAMMERTOE RECONSTRUCTION AND GASTROC SLIDE Right 06/27/2015   Procedure: RIGHT GASTROC RECESSION/POSTERIOR TIBIAL TENOLYSIS/SECOND AND THIRD METATARSAL WEIL OSTEOTOMY AND HAMMERTOE CORRECTION;  Surgeon: Toni Arthurs, MD;  Location: [facility];  Service: Orthopedics;  Laterality: Right;   CALCANEAL OSTEOTOMY Right 06/27/2015   Procedure: CALCANEAL OSTEOTOMY;  Surgeon: Toni Arthurs, MD;  Location: [facility];  Service: Orthopedics;  Laterality: Right;   COLONOSCOPY  12/09/2001  RMR: Normal normal rectum/ A few scattered left-sided diverticula.  The remainder of the colonic  mucosa appeared normal   COLONOSCOPY  12/15/2002   RMR: Normal rectum, scattered left-sided diverticula.  The remainder of the colonic mucosa appeared normal   COLONOSCOPY  04/10/2004   RMR: Normal rectum/Sigmoid diverticula/The remainder of the colonic mucosa appeared normal   COLONOSCOPY  12/23/2005   RMR: Normal rectum, scattered sigmoid diverticula Colonic mucosa appeared normal   COLONOSCOPY  01/20/2008   RMR: Distal diminutive rectal polyps, status post cold biopsy removal  otherwise, normal rectum/ biopsy removal; scattered sigmoid diverticula; and benign-appearing/ulcers about the ileocecal  valve, status post biopsy.  Remainder of colonic mucosa and terminal mucosa appeared normal.   COLONOSCOPY  05/22/2010   RMR: distal diminutive rectal polyp s/p bx otherwise normal/few scattered pancolonic diverticula   COLONOSCOPY  09/16/2012   Dr.Rourk- normal rectum, few scattered pancolonic diverticulosis, one diminutive polyp in the base of the cecum. 3x3 area of hyper pigmentation i the mid descending segment, this was a flat benign-appearing area o/w the remainder of the colonic mucosa appeared normal. bx= tubular adenoma and benign colonic mucosa   COLONOSCOPY WITH PROPOFOL N/A 09/05/2015   Procedure: COLONOSCOPY WITH PROPOFOL;  Surgeon: Corbin Ade, MD;  [facility];  Service: Endoscopy;  Laterality: N/A;  0830   COLONOSCOPY WITH PROPOFOL N/A 05/24/2017   Procedure: COLONOSCOPY WITH PROPOFOL;  Surgeon: Corbin Ade, MD;  [facility];  Service: Endoscopy;  Laterality: N/A;   COLONOSCOPY WITH PROPOFOL N/A 09/04/2021   Procedure: COLONOSCOPY WITH PROPOFOL;  Surgeon: Corbin Ade, MD;  [facility];  Service: Endoscopy;  Laterality: N/A;  1:15 / ASA III-DO NOT MOVE. Rep is coming for EXALT ERCP scope.   COLONOSCOPY WITH PROPOFOL N/A 09/16/2022   Procedure: COLONOSCOPY WITH PROPOFOL;  Surgeon: Corbin Ade, MD;  [facility];  Service: Endoscopy;  Laterality: N/A;  7:30 am   colonscopy  09/04/2021   @ [place]   CYSTOSCOPY WITH BIOPSY N/A 09/12/2021   Procedure: CYSTOSCOPY WITH BLADDERBIOPSY/ FULGURATION/ INSTILLATION OF MARCAINE/PYRIDIUM;  Surgeon: Jerilee Field, MD;  Location: [facility];  Service: Urology;  Laterality: N/A;   ESOPHAGOGASTRODUODENOSCOPY  12/23/2005   RMR:   Esophagogastric peptic stricture with reflux esophagitis, status post  dilation as described above/ Small hiatal hernia, otherwise normal stomach.  Bulbar erosions otherwise normal D1 and D2   ESOPHAGOGASTRODUODENOSCOPY (EGD) WITH PROPOFOL   09/04/2021   Procedure: ESOPHAGOGASTRODUODENOSCOPY (EGD) WITH PROPOFOL;  Surgeon: Corbin Ade, MD;  [facility];  Service: Endoscopy;;   ESOPHAGOGASTRODUODENOSCOPY (EGD) WITH PROPOFOL  09/16/2022   Procedure: ESOPHAGOGASTRODUODENOSCOPY (EGD) WITH PROPOFOL;  Surgeon: Corbin Ade, MD;  [facility];  Service: Endoscopy;;   EYE SURGERY  as child   FOOT ARTHRODESIS Right 06/17/2017   Procedure: Right Talonavicular and Subtalar Arthrodesis;  Surgeon: Toni Arthurs, MD;  Location: [facility];  Service: Orthopedics;  Laterality: Right;   HARDWARE REMOVAL Right 06/25/2016   Procedure: REMOVAL OF DEEP IMPLANTS RIGHT MEDIAL CUNEIFORM, RIGHT CALCANEAL REMOVAL OF DEEP IMPLANTS;  Surgeon: Toni Arthurs, MD;  Location: [facility];  Service: Orthopedics;  Laterality: Right;   JOINT REPLACEMENT  09/30/2010   Right knee   KNEE SURGERY Right    bilat    NOSE SURGERY Left    cartiledge removal   POLYPECTOMY  09/05/2015   Procedure: POLYPECTOMY;  Surgeon: Corbin Ade, MD;  [facility];  Service: Endoscopy;;   POLYPECTOMY  05/24/2017   Procedure: POLYPECTOMY;  Surgeon: Corbin Ade, MD;  [facility];  Service: Endoscopy;;  colon   POLYPECTOMY  09/04/2021   Procedure: POLYPECTOMY;  Surgeon: Corbin Ade, MD;  [facility];  Service: Endoscopy;;   POLYPECTOMY  09/16/2022   Procedure: POLYPECTOMY;  Surgeon: Corbin Ade, MD;  [facility];  Service: Endoscopy;;   RHINOPLASTY     TOTAL HIP ARTHROPLASTY Left 03/20/2015   Procedure: LEFT TOTAL HIP ARTHROPLASTY ANTERIOR APPROACH;  Surgeon: Ollen Gross, MD;  Location: [facility];  Service: Orthopedics;  Laterality: Left;   TOTAL KNEE ARTHROPLASTY Left 10/23/2013   Procedure: LEFT TOTAL KNEE ARTHROPLASTY;  Surgeon: Loanne Drilling, MD;  Location: [facility];  Service: Orthopedics;  Laterality: Left;   TYMPANOSTOMY TUBE PLACEMENT Right    early 2014 and multiple  times   TYMPANOSTOMY TUBE PLACEMENT      Home Medications:  Allergies as of 02/15/2023       Reactions   Doxycycline    Pt states he can now tolerate this medication and had it over the summer   Lyrica [pregabalin] Other (See Comments)   "changes personality"        Medication List        Accurate as of February 15, 2023 11:03 AM. If you have any questions, ask your nurse or doctor.          ALPRAZolam 1 MG tablet Commonly known as: XANAX Take 1 mg by mouth 4 (four) times daily as needed for anxiety.   aspirin EC 81 MG tablet Take 1 tablet (81 mg total) by mouth 2 (two) times daily. What changed: when to take this   celecoxib 200 MG capsule Commonly known as: CELEBREX Take 200 mg by mouth daily.   cetirizine 10 MG tablet Commonly known as: ZYRTEC Take 10 mg by mouth daily.   donepezil 5 MG tablet Commonly known as: ARICEPT Take 5 mg by mouth at bedtime.   escitalopram 10 MG tablet Commonly known as: LEXAPRO Take 10 mg by mouth daily.   Glucosamine Chond MSM Formula Tabs Take 1 tablet by mouth daily.   Humira (2 Pen) 40 MG/0.4ML Pnkt Generic drug: Adalimumab Inject 40 mg into the skin every 14 (fourteen) days.   pantoprazole 40 MG tablet Commonly known as: PROTONIX Take 1 tablet (40 mg total) by mouth 2 (two) times daily before a meal.   Potassium 99 MG Tabs Take 99 mg by mouth daily.   simvastatin 20 MG tablet Commonly known as: ZOCOR Take 20 mg by mouth at bedtime.   Vitamin D3 125 MCG (5000 UT) Caps Take 5,000 Units by mouth daily.   zinc gluconate 50 MG tablet Take 50 mg by mouth daily.        Allergies:  Allergies  Allergen Reactions   Doxycycline     Pt states he can now tolerate this medication and had it over the summer   Lyrica [Pregabalin] Other (See Comments)    "changes personality"    Family History: Family History  Problem Relation Age of Onset   Colon cancer Brother        63    Colon cancer Brother        35   Colon  cancer Mother        age 63    Dementia Mother    Arthritis Mother    Heart attack Father  Colon cancer Cousin        55 years old onset   Colon cancer Cousin        49 years onset    Social History:  reports that he quit smoking about 17 years ago. His smoking use included cigars and cigarettes. He has a 15.00 pack-year smoking history. He has never used smokeless tobacco. He reports current alcohol use. He reports that he does not currently use drugs.   Physical Exam: There were no vitals taken for this visit.  Constitutional:  Alert and oriented, No acute distress. HEENT: [place] AT, moist mucus membranes.  Trachea midline, no masses. Cardiovascular: No clubbing, cyanosis, or edema. Respiratory: Normal respiratory effort, no increased work of breathing. GI: Abdomen is soft, nontender, nondistended, no abdominal masses GU: No CVA tenderness Skin: No rashes, bruises or suspicious lesions. Neurologic: Grossly intact, no focal deficits, moving all 4 extremities. Psychiatric: Normal mood and affect.  Laboratory Data: Lab Results  Component Value Date   WBC 7.3 04/22/2022   HGB 16.0 04/22/2022   HCT 46.1 04/22/2022   MCV 89.5 04/22/2022   PLT 220 04/22/2022    Lab Results  Component Value Date   CREATININE 0.71 09/10/2022    No results found for: "PSA"  No results found for: "TESTOSTERONE"  No results found for: "HGBA1C"  Urinalysis    Component Value Date/Time   COLORURINE STRAW (A) 09/14/2018 1900   APPEARANCEUR Clear 09/29/2021 1130   LABSPEC 1.008 09/14/2018 1900   PHURINE 5.0 09/14/2018 1900   GLUCOSEU Negative 09/29/2021 1130   HGBUR MODERATE (A) 09/14/2018 1900   BILIRUBINUR Negative 09/29/2021 1130   KETONESUR NEGATIVE 09/14/2018 1900   PROTEINUR Trace (A) 09/29/2021 1130   PROTEINUR NEGATIVE 09/14/2018 1900   UROBILINOGEN 1.0 03/15/2015 1508   NITRITE Negative 09/29/2021 1130   NITRITE NEGATIVE 09/14/2018 1900   LEUKOCYTESUR Trace (A) 09/29/2021 1130     Lab Results  Component Value Date   LABMICR See below: 09/29/2021   WBCUA 0-5 09/29/2021   LABEPIT None seen 09/29/2021   MUCUS Present 09/18/2021   BACTERIA Few (A) 09/29/2021    Pertinent Imaging:   Assessment & Plan:    1. Urine frequency PSA was sent. Disc surv, meds and procedures. Disc daily tadalafil. He will start.   - Urinalysis, Routine w reflex microscopic - Bladder Scan (Post Void Residual) in office  2. ED - disc nature r/b/a to pde5i. Disc do not take prn sildenafil.   3. Renal cyst - benign on imaging   No follow-ups on file.  Jerilee Field, MD  [facility]  [address] [place], [place] [address] [phone]

## 2023-02-16 LAB — PSA: Prostate Specific Ag, Serum: 0.5 ng/mL (ref 0.0–4.0)

## 2023-02-17 ENCOUNTER — Ambulatory Visit: Payer: Medicare PPO | Admitting: Urology

## 2023-02-22 ENCOUNTER — Telehealth: Payer: Self-pay

## 2023-02-22 ENCOUNTER — Telehealth: Payer: Self-pay | Admitting: Internal Medicine

## 2023-02-22 NOTE — Telephone Encounter (Signed)
Patient needing to know what medication Dr. Ronne Binning recommends to help with frequent urination.    Please advise as soon as possible.  Call back:  [phone]

## 2023-02-22 NOTE — Telephone Encounter (Signed)
Please see below, can you confirm rx?

## 2023-02-22 NOTE — Telephone Encounter (Signed)
Patient informed of MD recommendation to start tadalafil.  Patient aware rx is at the pharmacy and voiced understanding.

## 2023-02-22 NOTE — Telephone Encounter (Signed)
Made Dr. Jena Gauss aware.

## 2023-02-22 NOTE — Telephone Encounter (Signed)
Patient left a message saying he wanted to speak to Dr. Jena Gauss.  That was all that was on the message.

## 2023-02-24 ENCOUNTER — Emergency Department (HOSPITAL_COMMUNITY): Payer: Medicare PPO

## 2023-02-24 ENCOUNTER — Emergency Department (HOSPITAL_COMMUNITY)
Admission: EM | Admit: 2023-02-24 | Discharge: 2023-02-24 | Disposition: A | Discharge status: Home / Self Care | Payer: Medicare PPO | Attending: Emergency Medicine | Admitting: Emergency Medicine

## 2023-02-24 ENCOUNTER — Telehealth: Payer: Self-pay

## 2023-02-24 ENCOUNTER — Ambulatory Visit (INDEPENDENT_AMBULATORY_CARE_PROVIDER_SITE_OTHER): Payer: Medicare PPO

## 2023-02-24 ENCOUNTER — Ambulatory Visit: Payer: Medicare PPO

## 2023-02-24 ENCOUNTER — Other Ambulatory Visit: Payer: Self-pay

## 2023-02-24 ENCOUNTER — Encounter (HOSPITAL_COMMUNITY): Payer: Self-pay | Admitting: *Deleted

## 2023-02-24 DIAGNOSIS — R350 Frequency of micturition: Secondary | ICD-10-CM

## 2023-02-24 DIAGNOSIS — Z7982 Long term (current) use of aspirin: Secondary | ICD-10-CM | POA: Insufficient documentation

## 2023-02-24 DIAGNOSIS — M5441 Lumbago with sciatica, right side: Secondary | ICD-10-CM | POA: Insufficient documentation

## 2023-02-24 DIAGNOSIS — R109 Unspecified abdominal pain: Secondary | ICD-10-CM | POA: Insufficient documentation

## 2023-02-24 DIAGNOSIS — M545 Low back pain, unspecified: Secondary | ICD-10-CM | POA: Diagnosis present

## 2023-02-24 HISTORY — DX: Lyme disease, unspecified: A69.20

## 2023-02-24 LAB — URINALYSIS, ROUTINE W REFLEX MICROSCOPIC
Bilirubin Urine: NEGATIVE
Bilirubin, UA: NEGATIVE
Glucose, UA: NEGATIVE
Glucose, UA: NEGATIVE mg/dL
Hgb urine dipstick: NEGATIVE
Ketones, UA: NEGATIVE
Ketones, ur: NEGATIVE mg/dL
Leukocytes,UA: NEGATIVE
Leukocytes,Ua: NEGATIVE
Nitrite, UA: NEGATIVE
Nitrite: NEGATIVE
Protein, ur: NEGATIVE mg/dL
Protein,UA: NEGATIVE
Specific Gravity, UA: 1.01 (ref 1.005–1.030)
Specific Gravity, Urine: 1.009 (ref 1.005–1.030)
Urobilinogen, Ur: 0.2 mg/dL (ref 0.2–1.0)
pH, UA: 7 (ref 5.0–7.5)
pH: 7 (ref 5.0–8.0)

## 2023-02-24 LAB — MICROSCOPIC EXAMINATION
Bacteria, UA: NONE SEEN
WBC, UA: NONE SEEN /hpf (ref 0–5)

## 2023-02-24 MED ORDER — PREDNISONE 10 MG (21) PO TBPK: ORAL_TABLET | Freq: Every day | ORAL | 0 refills | Status: DC

## 2023-02-24 MED ORDER — METHOCARBAMOL 500 MG PO TABS: 500.0000 mg | ORAL_TABLET | Freq: Two times a day (BID) | ORAL | 0 refills | Status: DC

## 2023-02-24 MED ORDER — KETOROLAC TROMETHAMINE 15 MG/ML IJ SOLN
15.0000 mg | Freq: Once | INTRAMUSCULAR | Status: AC
  Administered 2023-02-24: 15 mg via INTRAMUSCULAR
  Filled 2023-02-24: qty 1

## 2023-02-24 MED ORDER — OXYCODONE-ACETAMINOPHEN 5-325 MG PO TABS
1.0000 | ORAL_TABLET | Freq: Once | ORAL | Status: AC
  Administered 2023-02-24: 1 via ORAL
  Filled 2023-02-24: qty 1

## 2023-02-24 NOTE — Telephone Encounter (Signed)
Patient came in today with flank pain and frequent urination. PVR done on patient with 48 ml seen. Patient states he is having lower back and sometime it hurts when he urinate. Patient is aware that a message will be sent to Dr. Mena Goes on recommendation. Patient voiced understanding.

## 2023-02-24 NOTE — Discharge Instructions (Addendum)

## 2023-02-24 NOTE — Progress Notes (Addendum)
Patient comes in today with flank pain and frequent urination.Pt here today for bladder scan. Bladder was scanned and 48 was visualized. Patient presents today with complaints of  frequent urination.  UA done today.  Dr. Ronne Binning reviewed results and no treatment needed.  Patient aware of MD recommendations and that we will reach out with culture results.        Performed by Kennyth Lose, CMA

## 2023-02-24 NOTE — ED Notes (Signed)
Pt returned from CT °

## 2023-02-24 NOTE — ED Triage Notes (Signed)
Pt with right lower back pain for weeks, denies any radiation down leg.

## 2023-02-24 NOTE — ED Provider Notes (Signed)
[place] EMERGENCY DEPARTMENT AT [facility] Provider Note   CSN: [id number] Arrival date & time: 02/24/23  1017     History  Chief Complaint  Patient presents with   Back Pain    Craig Scott is a 67 y.o. 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  Patient rates pain 9/10.  He denies any urinary or fecal incontinence.  He denies any numbness, tingling of his arms, legs.  He denies any recent injury.  He denies history of IV drug use, chronic corticosteroid use, cancer.  Patient has endorsed some intermittent dysuria, he is seeing Dr. Mena Goes with urology regarding this, denies any blood in the urine, denies any previous history of kidney stones.   Back Pain      Home Medications Prior to Admission medications   Medication Sig Start Date End Date Taking? Authorizing Provider  methocarbamol (ROBAXIN) 500 MG tablet Take 1 tablet (500 mg total) by mouth 2 (two) times daily. 02/24/23  Yes Kameron Glazebrook H, PA-C  predniSONE (STERAPRED UNI-PAK 21 TAB) 10 MG (21) TBPK tablet Take by mouth daily. Take 6 tabs by mouth daily  for 2 days, then 5 tabs for 2 days, then 4 tabs for 2 days, then 3 tabs for 2 days, 2 tabs for 2 days, then 1 tab by mouth daily for 2 days 02/24/23  Yes Hiep Ollis H, PA-C  ALPRAZolam (XANAX) 1 MG tablet Take 1 mg by mouth 4 (four) times daily as needed for anxiety.    [provider]  aspirin EC 81 MG tablet Take 1 tablet (81 mg total) by mouth 2 (two) times daily. Patient taking differently: Take 81 mg by mouth daily. 06/17/17   Jacinta Shoe, PA-C  celecoxib (CELEBREX) 200 MG capsule Take 200 mg by mouth daily.    [provider]  cetirizine (ZYRTEC) 10 MG tablet Take 10 mg by mouth daily.     [provider]  Cholecalciferol (VITAMIN D3) 125 MCG (5000 UT) CAPS Take 5,000 Units by mouth daily.    [provider]  donepezil (ARICEPT) 5 MG tablet Take 5 mg by mouth at bedtime. 04/14/22   [provider]  escitalopram (LEXAPRO) 10 MG tablet Take 10 mg by mouth daily. 04/14/22   [provider]  HUMIRA PEN 40 MG/0.4ML PNKT Inject 40 mg into the skin every 14 (fourteen) days. 06/06/21   [provider]  Misc Natural Products (GLUCOSAMINE CHOND MSM FORMULA) TABS Take 1 tablet by mouth daily.    [provider]  pantoprazole (PROTONIX) 40 MG tablet Take 1 tablet (40 mg total) by mouth 2 (two) times daily before a meal. 01/11/23   Rourk, Gerrit Friends, MD  Potassium 99 MG TABS Take 99 mg by mouth daily.    [provider]  simvastatin (ZOCOR) 20 MG tablet Take 20 mg by mouth at bedtime.    [provider]  tadalafil (CIALIS) 5 MG tablet Take 1 tablet (5 mg total) by mouth daily. 02/15/23   Jerilee Field, MD  zinc gluconate 50 MG tablet Take 50 mg by mouth daily.    [provider]      Allergies    Doxycycline and Lyrica [pregabalin]    Review of Systems   Review of Systems  Musculoskeletal:  Positive  for back pain.  All other systems reviewed and are negative.   Physical Exam Updated Vital Signs BP 105/64   Pulse 60   Temp 97.9 F (36.6 C) (Oral)   Resp 14   Ht 5\' 11"  (1.803 m)   Wt 104.3 kg   SpO2 95%   BMI 32.08 kg/m  Physical Exam Vitals and nursing note reviewed.  Constitutional:      General: He is not in acute distress.    Appearance: Normal appearance.  HENT:     Head: Normocephalic and atraumatic.  Eyes:     General:        Right eye: No discharge.        Left eye: No discharge.  Cardiovascular:     Rate and Rhythm: Normal rate and regular rhythm.     Pulses: Normal pulses.     Heart sounds: No murmur heard.    No friction rub. No gallop.  Pulmonary:     Effort: Pulmonary effort  is normal.     Breath sounds: Normal breath sounds.  Abdominal:     General: Bowel sounds are normal.     Palpations: Abdomen is soft.  Musculoskeletal:     Comments: Intact strength 5/5 bilateral upper extremities, 5/5 strength of the left lower extremity, positive straight leg raise on right, 4/5 strength to flexion, extension at the hip secondary to pain.  Antalgic gait.  Skin:    General: Skin is warm and dry.     Capillary Refill: Capillary refill takes less than 2 seconds.  Neurological:     Mental Status: He is alert and oriented to person, place, and time.  Psychiatric:        Mood and Affect: Mood normal.        Behavior: Behavior normal.     ED Results / Procedures / Treatments   Labs (all labs ordered are listed, but only abnormal results are displayed) Labs Reviewed  URINALYSIS, ROUTINE W REFLEX MICROSCOPIC    EKG None  Radiology CT Renal Stone Study  Result Date: 02/24/2023 CLINICAL DATA:  Right-sided hip pain.  Flank pain. EXAM: CT ABDOMEN AND PELVIS WITHOUT CONTRAST TECHNIQUE: Multidetector CT imaging of the abdomen and pelvis was performed following the standard protocol without IV contrast. RADIATION DOSE REDUCTION: This exam was performed according to the departmental dose-optimization program which includes automated exposure control, adjustment of the mA and/or kV according to patient size and/or use of iterative reconstruction technique. COMPARISON:  04/22/2022. FINDINGS: Lower chest: No acute findings. Heart is at the upper limits of normal in size. No pericardial or pleural effusion. Distal esophagus is grossly unremarkable. Hepatobiliary: Liver and gallbladder are unremarkable. No biliary ductal dilatation. Pancreas: Negative. Spleen: Negative. Adrenals/Urinary Tract: Adrenal glands and right kidney are unremarkable. Subcentimeter exophytic lesion off the anterior left kidney, too small to characterize but stable. No specific follow-up necessary. Left kidney is  otherwise unremarkable. Ureters are decompressed. Bladder is grossly unremarkable. Stomach/Bowel: Stomach, small bowel and appendix are unremarkable. A fair amount of stool is seen in the colon. Vascular/Lymphatic: Atherosclerotic calcification of the aorta. No pathologically enlarged lymph nodes. Reproductive: Prostate is visualized. Other: Mild haziness and nodularity in the small bowel mesentery, unchanged. No free fluid. Musculoskeletal: Left hip arthroplasty. Degenerative changes in the spine. No worrisome lytic or sclerotic lesions. Minimal grade 1 anterolisthesis of L4 on L5. IMPRESSION: 1. No urinary stones or obstruction. 2. Stool in the majority of the colon is indicative constipation. 3.  Aortic atherosclerosis (ICD10-I70.0). Electronically  Signed   By: Leanna Battles M.D.   On: 02/24/2023 14:44    Procedures Procedures    Medications Ordered in ED Medications  oxyCODONE-acetaminophen (PERCOCET/ROXICET) 5-325 MG per tablet 1 tablet (1 tablet Oral Given 02/24/23 1307)  ketorolac (TORADOL) 15 MG/ML injection 15 mg (15 mg Intramuscular Given 02/24/23 1309)    ED Course/ Medical Decision Making/ A&P                             Medical Decision Making Amount and/or Complexity of Data Reviewed Labs: ordered. Radiology: ordered.  Risk Prescription drug management.   Patient with back pain.  My emergent differential diagnosis includes slipped disc, compression fracture, spondylolisthesis, less clinical concern for epidural abscess or osteomyelitis based on patient history.  Overall with high clinical suspicion for lumbar strain versus sciatica.  No neurological deficits. Patient is ambulatory. No warning symptoms of back pain including: fecal incontinence, urinary retention or overflow incontinence, night sweats, waking from sleep with back pain, unexplained fevers or weight loss, h/o cancer, IVDU, recent trauma. No concern for cauda equina, epidural abscess, or other serious cause of back  pain.  Given this work-up, evaluation, physical exam I do not believe that radiographic imaging is indicated at this time For the back.  Considering his dysuria obtain urinalysis as well as CT renal stone study.  UA with no evidence of urinary tract infection, and renal stone study with no evidence of kidney stone.  Especially with positive straight leg raise on right patient's symptoms are consistent with sciatica on the right.  Conservative measures such as rest, ice/heat, ibuprofen, Tylenol, and  prescription for Robaxin indicated with orthopedic follow-up if no improvement with conservative management.  Extensive return precautions given, patient discharged in stable condition at this time.  Final Clinical Impression(s) / ED Diagnoses Final diagnoses:  Acute right-sided low back pain with right-sided sciatica    Rx / DC Orders ED Discharge Orders          Ordered    predniSONE (STERAPRED UNI-PAK 21 TAB) 10 MG (21) TBPK tablet  Daily        02/24/23 1548    methocarbamol (ROBAXIN) 500 MG tablet  2 times daily        02/24/23 1548              Tayler Lassen, Basile, PA-C 02/24/23 1609    Rondel Baton, MD 02/25/23 1155

## 2023-02-26 ENCOUNTER — Telehealth: Payer: Self-pay

## 2023-02-26 NOTE — Telephone Encounter (Signed)
Return call to patient. Patient states that he needs a prior authorization for the Tadalafil 5 mg. Patient is made aware that a prior authorization will be done and can take up to 72 hours for response. Patient voiced understanding.

## 2023-02-26 NOTE — Telephone Encounter (Signed)
Patient left a voice message 02-25-2023.  Cannot pick up Rx at pharmacy.  Needing a prior authorization on medication prescribed.

## 2023-03-03 NOTE — Telephone Encounter (Addendum)
KEY: [id number] PA pending waiting response and patient made aware.

## 2023-03-03 NOTE — Telephone Encounter (Signed)
Patient came in office checking on the status of his prior authorization for Tadalafil 5 mg medication. Craig Scott, CMA will give patient a call back.

## 2023-03-05 ENCOUNTER — Telehealth: Payer: Self-pay

## 2023-03-05 NOTE — Telephone Encounter (Signed)
Patient is made aware and appeal will be done, possible take up to a week. Patient voiced understanding.

## 2023-03-05 NOTE — Telephone Encounter (Signed)
-----   Message from Ferdinand Lango, RN sent at 03/04/2023 10:37 AM EDT ----- Medication denial

## 2023-03-08 DIAGNOSIS — R7989 Other specified abnormal findings of blood chemistry: Secondary | ICD-10-CM | POA: Diagnosis not present

## 2023-03-08 DIAGNOSIS — E6609 Other obesity due to excess calories: Secondary | ICD-10-CM | POA: Diagnosis not present

## 2023-03-08 DIAGNOSIS — Z6831 Body mass index (BMI) 31.0-31.9, adult: Secondary | ICD-10-CM | POA: Diagnosis not present

## 2023-03-08 DIAGNOSIS — N400 Enlarged prostate without lower urinary tract symptoms: Secondary | ICD-10-CM | POA: Diagnosis not present

## 2023-03-09 NOTE — Patient Instructions (Signed)
[id number] name Craig Scott                                                                                                                                                                    DOB 02/25/1956                                                                                                                                                      Date: 03/09/2023  Subject: Coverage for Tadalafil 5 mg tablets   Dear Bunnie Domino,  I am writing on behalf of Ladarious Kresse to appeal the denial of coverage for Tadalafil 5 mg tablets. In a letter dated 03/04/2023, medicare Part D stated that Tadalafil 5 mg tablets is not covered because [are excluded]. I have reviewed your letter, and based on my medical expertise, ask that you reconsider this decision. Based on a clinical assessment of my patient, I believe Tadalafil 5 mg tablets is medically necessary for my patient and request that you reconsider this coverage decision.   I have enclosed additional documentation that further supports treatment with Tadalafil 5 mg tablets and should address the concerns laid out in the denial letter. Based upon the additional documentation submitted, I ask that you consider reversing the previous denial of denial. I can be reached at [phone] if additional information is required for approval of this request.   Thank you for your attention to this very important matter.  Sincerely,   Kennyth Lose, [facility] Urology [address] [place], [place] [address]  Enclosures  Original denial letter Relevant medical records

## 2023-03-15 ENCOUNTER — Ambulatory Visit: Payer: Medicare PPO | Admitting: Urology

## 2023-04-05 ENCOUNTER — Telehealth: Payer: Self-pay | Admitting: Internal Medicine

## 2023-04-05 NOTE — Telephone Encounter (Signed)
Well we aren't scheduling for January for TCS's or OV's for Dr. Jena Gauss. He will be triaged if that is what Dr. Jena Gauss wanted and will get a questionnaire to ensure no changes in meds/medical history.

## 2023-04-05 NOTE — Telephone Encounter (Signed)
Patient came into office last week wanting to make his colonoscopy scheduled for January 2025. He was last seen by RMR in 10/2022 and is on the recall for a one year colonoscopy 09/2023. Patient was told he would get a questionnaire and he wanted to know why since RMR said to repeat it in one year. Please advise if he needs OV first or can he be triaged. He is apparently a "friend of" RMR and wants to be guaranteed he will be on the colonoscopy schedule the first of January. If he needs an OV I will go ahead and schedule OV while RMR schedule is still open. [phone]

## 2023-04-05 NOTE — Telephone Encounter (Signed)
That is what I told patient, that the procedure schedule for January was not available at this time.

## 2023-04-06 DIAGNOSIS — M1991 Primary osteoarthritis, unspecified site: Secondary | ICD-10-CM | POA: Diagnosis not present

## 2023-04-06 DIAGNOSIS — R5382 Chronic fatigue, unspecified: Secondary | ICD-10-CM | POA: Diagnosis not present

## 2023-04-06 DIAGNOSIS — Z6831 Body mass index (BMI) 31.0-31.9, adult: Secondary | ICD-10-CM | POA: Diagnosis not present

## 2023-04-06 DIAGNOSIS — M79671 Pain in right foot: Secondary | ICD-10-CM | POA: Diagnosis not present

## 2023-04-06 DIAGNOSIS — M0579 Rheumatoid arthritis with rheumatoid factor of multiple sites without organ or systems involvement: Secondary | ICD-10-CM | POA: Diagnosis not present

## 2023-04-06 DIAGNOSIS — E669 Obesity, unspecified: Secondary | ICD-10-CM | POA: Diagnosis not present

## 2023-05-07 DIAGNOSIS — R3912 Poor urinary stream: Secondary | ICD-10-CM | POA: Diagnosis not present

## 2023-05-07 DIAGNOSIS — R350 Frequency of micturition: Secondary | ICD-10-CM | POA: Diagnosis not present

## 2023-05-11 DIAGNOSIS — Z0001 Encounter for general adult medical examination with abnormal findings: Secondary | ICD-10-CM | POA: Diagnosis not present

## 2023-05-11 DIAGNOSIS — Z6831 Body mass index (BMI) 31.0-31.9, adult: Secondary | ICD-10-CM | POA: Diagnosis not present

## 2023-05-11 DIAGNOSIS — M069 Rheumatoid arthritis, unspecified: Secondary | ICD-10-CM | POA: Diagnosis not present

## 2023-05-11 DIAGNOSIS — N400 Enlarged prostate without lower urinary tract symptoms: Secondary | ICD-10-CM | POA: Diagnosis not present

## 2023-05-11 DIAGNOSIS — E782 Mixed hyperlipidemia: Secondary | ICD-10-CM | POA: Diagnosis not present

## 2023-05-11 DIAGNOSIS — E6609 Other obesity due to excess calories: Secondary | ICD-10-CM | POA: Diagnosis not present

## 2023-05-11 DIAGNOSIS — Z1331 Encounter for screening for depression: Secondary | ICD-10-CM | POA: Diagnosis not present

## 2023-05-12 DIAGNOSIS — Z125 Encounter for screening for malignant neoplasm of prostate: Secondary | ICD-10-CM | POA: Diagnosis not present

## 2023-05-12 DIAGNOSIS — E7849 Other hyperlipidemia: Secondary | ICD-10-CM | POA: Diagnosis not present

## 2023-05-31 ENCOUNTER — Ambulatory Visit: Payer: Medicare PPO | Admitting: Urology

## 2023-06-08 DIAGNOSIS — R3912 Poor urinary stream: Secondary | ICD-10-CM | POA: Diagnosis not present

## 2023-06-08 DIAGNOSIS — R350 Frequency of micturition: Secondary | ICD-10-CM | POA: Diagnosis not present

## 2023-06-15 DIAGNOSIS — M79671 Pain in right foot: Secondary | ICD-10-CM | POA: Diagnosis not present

## 2023-06-15 DIAGNOSIS — R5382 Chronic fatigue, unspecified: Secondary | ICD-10-CM | POA: Diagnosis not present

## 2023-06-15 DIAGNOSIS — M1991 Primary osteoarthritis, unspecified site: Secondary | ICD-10-CM | POA: Diagnosis not present

## 2023-06-15 DIAGNOSIS — M0579 Rheumatoid arthritis with rheumatoid factor of multiple sites without organ or systems involvement: Secondary | ICD-10-CM | POA: Diagnosis not present

## 2023-06-15 DIAGNOSIS — Z6830 Body mass index (BMI) 30.0-30.9, adult: Secondary | ICD-10-CM | POA: Diagnosis not present

## 2023-06-15 DIAGNOSIS — E669 Obesity, unspecified: Secondary | ICD-10-CM | POA: Diagnosis not present

## 2023-06-28 DIAGNOSIS — M19271 Secondary osteoarthritis, right ankle and foot: Secondary | ICD-10-CM | POA: Diagnosis not present

## 2023-06-28 DIAGNOSIS — M79671 Pain in right foot: Secondary | ICD-10-CM | POA: Diagnosis not present

## 2023-06-28 DIAGNOSIS — M21961 Unspecified acquired deformity of right lower leg: Secondary | ICD-10-CM | POA: Diagnosis not present

## 2023-06-28 DIAGNOSIS — M069 Rheumatoid arthritis, unspecified: Secondary | ICD-10-CM | POA: Diagnosis not present

## 2023-07-02 DIAGNOSIS — R350 Frequency of micturition: Secondary | ICD-10-CM | POA: Diagnosis not present

## 2023-07-02 DIAGNOSIS — R3915 Urgency of urination: Secondary | ICD-10-CM | POA: Diagnosis not present

## 2023-08-16 ENCOUNTER — Other Ambulatory Visit: Payer: Self-pay | Admitting: Internal Medicine

## 2023-08-31 ENCOUNTER — Encounter (INDEPENDENT_AMBULATORY_CARE_PROVIDER_SITE_OTHER): Payer: Self-pay | Admitting: *Deleted

## 2023-08-31 DIAGNOSIS — R350 Frequency of micturition: Secondary | ICD-10-CM | POA: Diagnosis not present

## 2023-08-31 DIAGNOSIS — R3915 Urgency of urination: Secondary | ICD-10-CM | POA: Diagnosis not present

## 2023-09-09 NOTE — Progress Notes (Signed)
Referring Provider: Elfredia Nevins, MD Primary Care Physician:  Elfredia Nevins, MD Primary GI Physician: Dr. Jena Gauss  Chief Complaint  Patient presents with   Colonoscopy    Colonoscopy screening     HPI:   Craig Scott is a 67 y.o. male with history of prior daily alcohol use, anxiety, arthritis, HLD, neuropathy, GERD, Lynch syndrome, adenomatous colon polyp, chronic diarrhea previously improved with stopping NSAIDs and alcohol, duodenal ulcer secondary to NSAIDs, presenting today to discuss scheduling surveillance colonoscopy.  Last colonoscopy 09/16/2022 with diverticulosis in the sigmoid and descending colon, diminutive polyps removed, segmental biopsies taken.  Internal hemorrhoids noted.  Pathology with tubular adenomas, likely sessile serrated adenoma, hyperplastic polyps.  Dr. Jena Gauss recommended 1 year surveillance.  Today: Reports he is doing well overall.  No concerns for me today.  Denies BRBPR, melena, abdominal pain, unintentional weight loss, constipation, diarrhea, nausea, vomiting, heartburn symptoms, dysphagia.  He is taking pantoprazole 40 mg twice daily.  Denies taking any NSAIDs.  Continues to try to limit alcohol use. Some days none, other days "only 4-5 beer".   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   Tubular adenoma    Urinary frequency    Wears glasses     Past Surgical History:  Procedure Laterality Date   BASAL CELL CARCINOMA EXCISION     BIOPSY  09/04/2021   Procedure:  BIOPSY;  Surgeon: Corbin Ade, MD;  [facility];  Service: Endoscopy;;   BIOPSY  09/16/2022   Procedure: BIOPSY;  Surgeon: Corbin Ade, MD;  [facility];  Service: Endoscopy;;   BUNIONECTOMY WITH HAMMERTOE RECONSTRUCTION AND GASTROC SLIDE Right 06/27/2015   Procedure: RIGHT GASTROC RECESSION/POSTERIOR TIBIAL TENOLYSIS/SECOND AND THIRD METATARSAL WEIL OSTEOTOMY AND HAMMERTOE CORRECTION;  Surgeon: Toni Arthurs, MD;  Location: [facility];  Service: Orthopedics;  Laterality: Right;   CALCANEAL OSTEOTOMY Right 06/27/2015   Procedure: CALCANEAL OSTEOTOMY;  Surgeon: Toni Arthurs, MD;  Location: [facility];  Service: Orthopedics;  Laterality: Right;   COLONOSCOPY  12/09/2001   RMR: Normal normal rectum/ A few scattered left-sided diverticula.  The remainder of the colonic  mucosa appeared normal   COLONOSCOPY  12/15/2002   RMR: Normal rectum, scattered left-sided diverticula.  The remainder of the colonic mucosa appeared normal   COLONOSCOPY  04/10/2004   RMR: Normal rectum/Sigmoid diverticula/The remainder of the colonic mucosa appeared normal   COLONOSCOPY  12/23/2005   RMR: Normal rectum, scattered sigmoid diverticula Colonic mucosa appeared normal   COLONOSCOPY  01/20/2008   RMR: Distal diminutive rectal polyps, status post cold biopsy removal  otherwise, normal rectum/ biopsy removal; scattered sigmoid diverticula; and benign-appearing/ulcers about the ileocecal valve, status post biopsy.  Remainder of colonic mucosa and terminal mucosa appeared normal.   COLONOSCOPY  05/22/2010   RMR: distal  diminutive rectal polyp s/p bx otherwise normal/few scattered pancolonic diverticula   COLONOSCOPY  09/16/2012   Dr.Rourk- normal rectum, few scattered pancolonic diverticulosis, one diminutive polyp in the base of the cecum. 3x3 area of hyper pigmentation i the mid descending segment, this was a flat benign-appearing area o/w the remainder of the  colonic mucosa appeared normal. bx= tubular adenoma and benign colonic mucosa   COLONOSCOPY WITH PROPOFOL N/A 09/05/2015   Procedure: COLONOSCOPY WITH PROPOFOL;  Surgeon: Corbin Ade, MD;  [facility];  Service: Endoscopy;  Laterality: N/A;  0830   COLONOSCOPY WITH PROPOFOL N/A 05/24/2017   Procedure: COLONOSCOPY WITH PROPOFOL;  Surgeon: Corbin Ade, MD;  [facility];  Service: Endoscopy;  Laterality: N/A;   COLONOSCOPY WITH PROPOFOL N/A 09/04/2021   Procedure: COLONOSCOPY WITH PROPOFOL;  Surgeon: Corbin Ade, MD;  [facility];  Service: Endoscopy;  Laterality: N/A;  1:15 / ASA III-DO NOT MOVE. Rep is coming for EXALT ERCP scope.   COLONOSCOPY WITH PROPOFOL N/A 09/16/2022   Procedure: COLONOSCOPY WITH PROPOFOL;  Surgeon: Corbin Ade, MD;  [facility];  Service: Endoscopy;  Laterality: N/A;  7:30 am   colonscopy  09/04/2021   @ [facility]   CYSTOSCOPY WITH BIOPSY N/A 09/12/2021   Procedure: CYSTOSCOPY WITH BLADDERBIOPSY/ FULGURATION/ INSTILLATION OF MARCAINE/PYRIDIUM;  Surgeon: Jerilee Field, MD;  Location: [facility];  Service: Urology;  Laterality: N/A;   ESOPHAGOGASTRODUODENOSCOPY  12/23/2005   RMR:   Esophagogastric peptic stricture with reflux esophagitis, status post  dilation as described above/ Small hiatal hernia, otherwise normal stomach.  Bulbar erosions otherwise normal D1 and D2   ESOPHAGOGASTRODUODENOSCOPY (EGD) WITH PROPOFOL  09/04/2021   Procedure: ESOPHAGOGASTRODUODENOSCOPY (EGD) WITH PROPOFOL;  Surgeon: Corbin Ade, MD;  [facility];  Service: Endoscopy;;   ESOPHAGOGASTRODUODENOSCOPY (EGD) WITH PROPOFOL  09/16/2022   Procedure: ESOPHAGOGASTRODUODENOSCOPY (EGD) WITH PROPOFOL;  Surgeon: Corbin Ade, MD;  [facility];  Service: Endoscopy;;   EYE SURGERY  as child   FOOT ARTHRODESIS Right 06/17/2017   Procedure: Right Talonavicular and Subtalar Arthrodesis;  Surgeon:  Toni Arthurs, MD;  Location: [facility];  Service: Orthopedics;  Laterality: Right;   HARDWARE REMOVAL Right 06/25/2016   Procedure: REMOVAL OF DEEP IMPLANTS RIGHT MEDIAL CUNEIFORM, RIGHT CALCANEAL REMOVAL OF DEEP IMPLANTS;  Surgeon: Toni Arthurs, MD;  Location: [facility];  Service: Orthopedics;  Laterality: Right;   JOINT REPLACEMENT  09/30/2010   Right knee   KNEE SURGERY Right    bilat    NOSE SURGERY Left    cartiledge removal   POLYPECTOMY  09/05/2015   Procedure: POLYPECTOMY;  Surgeon: Corbin Ade, MD;  [facility];  Service: Endoscopy;;   POLYPECTOMY  05/24/2017   Procedure: POLYPECTOMY;  Surgeon: Corbin Ade, MD;  [facility];  Service: Endoscopy;;  colon   POLYPECTOMY  09/04/2021   Procedure: POLYPECTOMY;  Surgeon: Corbin Ade, MD;  [facility];  Service: Endoscopy;;   POLYPECTOMY  09/16/2022   Procedure: POLYPECTOMY;  Surgeon: Corbin Ade, MD;  [facility];  Service: Endoscopy;;   RHINOPLASTY     TOTAL HIP ARTHROPLASTY Left 03/20/2015   Procedure: LEFT TOTAL HIP ARTHROPLASTY ANTERIOR APPROACH;  Surgeon: Ollen Gross, MD;  Location: [facility];  Service: Orthopedics;  Laterality: Left;   TOTAL KNEE ARTHROPLASTY Left 10/23/2013   Procedure: LEFT TOTAL KNEE ARTHROPLASTY;  Surgeon: Gus Rankin  Aluisio, MD;  Location: [facility];  Service: Orthopedics;  Laterality: Left;   TYMPANOSTOMY TUBE PLACEMENT Right    early 2014 and multiple times   TYMPANOSTOMY TUBE PLACEMENT      Current Outpatient Medications  Medication Sig Dispense Refill   ALPRAZolam (XANAX) 1 MG tablet Take 1 mg by mouth 4 (four) times daily as needed for anxiety.     aspirin EC 81 MG tablet Take 1 tablet (81 mg total) by mouth 2 (two) times daily. 84 tablet 0   cetirizine (ZYRTEC) 10 MG tablet Take 10 mg by mouth daily.     Cholecalciferol (VITAMIN D3) 125 MCG (5000 UT) CAPS Take 5,000 Units by mouth daily.     donepezil (ARICEPT) 5  MG tablet Take 5 mg by mouth at bedtime.     ENBREL SURECLICK 50 MG/ML injection Inject into the skin.     escitalopram (LEXAPRO) 10 MG tablet Take 10 mg by mouth daily.     etanercept (ENBREL) 50 MG/ML injection Inject into the skin.     methocarbamol (ROBAXIN) 500 MG tablet Take 1 tablet (500 mg total) by mouth 2 (two) times daily. 20 tablet 0   Misc Natural Products (GLUCOSAMINE CHOND MSM FORMULA) TABS Take 1 tablet by mouth daily.     pantoprazole (PROTONIX) 40 MG tablet TAKE ONE TABLET (40MG  TOTAL) BY MOUTH TWO TIMES DAILY BEFORE A MEAL (Patient taking differently: Take 40 mg by mouth 2 (two) times daily.) 60 tablet 3   Potassium 99 MG TABS Take 99 mg by mouth daily.     simvastatin (ZOCOR) 20 MG tablet Take 20 mg by mouth at bedtime.     solifenacin (VESICARE) 5 MG tablet Take 5 mg by mouth daily.     tadalafil (CIALIS) 5 MG tablet Take 1 tablet (5 mg total) by mouth daily. 30 tablet 11   tamsulosin (FLOMAX) 0.4 MG CAPS capsule Take by mouth.     zinc gluconate 50 MG tablet Take 50 mg by mouth daily.     No current facility-administered medications for this visit.    Allergies as of 09/10/2023 - Review Complete 09/10/2023  Allergen Reaction Noted   Doxycycline  06/30/2022   Lyrica [pregabalin] Other (See Comments) 03/14/2015    Family History  Problem Relation Age of Onset   Colon cancer Brother        63    Colon cancer Brother        28   Colon cancer Mother        age 75    Dementia Mother    Arthritis Mother    Heart attack Father    Colon cancer Cousin        57 years old onset   Colon cancer Cousin        49 years onset    Social History   Socioeconomic History   Marital status: Married    Spouse name: Not on file   Number of children: 2   Years of education: 12   Highest education level: Not on file  Occupational History   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: [employer]    Comment: [place]  Tobacco Use   Smoking status: Former    Current packs/day:  0.00    Average packs/day: 0.5 packs/day for 30.0 years (15.0 ttl pk-yrs)    Types: Cigars, Cigarettes    Start date: 09/15/1975    Quit date: 09/14/2005    Years since quitting: 18.0  Smokeless tobacco: Never  Vaping Use   Vaping status: Never Used  Substance and Sexual Activity   Alcohol use: Yes    Comment: 4-5 beer per day or none for a couple of days   Drug use: Not Currently    Comment: no marijuana use in 25 yrs per pt on 09-09-2021   Sexual activity: Yes    Birth control/protection: None  Other Topics Concern   Not on file  Social History Narrative   ** Merged History Encounter **       Social Drivers of Corporate investment banker Strain: Not on file  Food Insecurity: Not on file  Transportation Needs: Not on file  Physical Activity: Not on file  Stress: Not on file  Social Connections: Not on file    Review of Systems: Gen: Denies fever, chills, cold or flulike symptoms, presyncope, syncope. CV: Denies chest pain, palpitations. Resp: Denies dyspnea, cough. GI: See HPI Heme: See HPI  Physical Exam: BP 118/71 (BP Location: Right Arm, Patient Position: Sitting, Cuff Size: Large)   Pulse 66   Temp 97.7 F (36.5 C) (Temporal)   Ht 5\' 11"  (1.803 m)   Wt 225 lb 12.8 oz (102.4 kg)   BMI 31.49 kg/m  General:   Alert and oriented. No distress noted. Pleasant and cooperative.  Head:  Normocephalic and atraumatic. Eyes:  Conjuctiva clear without scleral icterus. Heart:  S1, S2 present without murmurs appreciated. Lungs:  Clear to auscultation bilaterally. No wheezes, rales, or rhonchi. No distress.  Abdomen:  +BS, soft, non-tender and non-distended. No rebound or guarding. No HSM or masses noted. Msk:  Symmetrical without gross deformities. Normal posture. Extremities:  Without edema. Neurologic:  Alert and  oriented x4 Psych:  Normal mood and affect.    Assessment:  67 y.o. male with history of prior daily alcohol use, anxiety, arthritis, HLD, neuropathy,  GERD, Lynch syndrome, adenomatous colon polyp, chronic diarrhea previously improved with stopping NSAIDs and alcohol, duodenal ulcer secondary to NSAIDs, presenting today to discuss scheduling surveillance colonoscopy.  He is doing well at this time with no significant GI symptoms, no alarm symptoms.   Plan:  Proceed with colonoscopy with propofol by Dr. Jena Gauss in near future. The risks, benefits, and alternatives have been discussed with the patient in detail. The patient states understanding and desires to proceed.  ASA 2 Follow-up PRN   Ermalinda Memos, [facility] 09/10/2023

## 2023-09-10 ENCOUNTER — Encounter: Payer: Self-pay | Admitting: Gastroenterology

## 2023-09-10 ENCOUNTER — Ambulatory Visit: Payer: Medicare PPO | Admitting: Gastroenterology

## 2023-09-10 VITALS — BP 118/71 | HR 66 | Temp 97.7°F | Ht 71.0 in | Wt 225.8 lb

## 2023-09-10 DIAGNOSIS — Z1211 Encounter for screening for malignant neoplasm of colon: Secondary | ICD-10-CM

## 2023-09-10 DIAGNOSIS — Z860100 Personal history of colon polyps, unspecified: Secondary | ICD-10-CM

## 2023-09-10 DIAGNOSIS — Z1509 Genetic susceptibility to other malignant neoplasm: Secondary | ICD-10-CM

## 2023-09-10 NOTE — Patient Instructions (Addendum)
We will get you scheduled for colonoscopy in the near future with Dr. Jena Gauss.   It was nice to meet you today!  Ermalinda Memos, PA-C [facility]

## 2023-09-13 ENCOUNTER — Other Ambulatory Visit: Payer: Self-pay | Admitting: *Deleted

## 2023-09-13 ENCOUNTER — Telehealth: Payer: Self-pay | Admitting: *Deleted

## 2023-09-13 ENCOUNTER — Encounter: Payer: Self-pay | Admitting: *Deleted

## 2023-09-13 MED ORDER — PEG 3350-KCL-NA BICARB-NACL 420 G PO SOLR: 4000.0000 mL | Freq: Once | ORAL | 0 refills | Status: AC

## 2023-09-13 NOTE — Telephone Encounter (Signed)
[employer] PA:  Approved Authorization #[id number]  Tracking #[id number] Dates of service 09/27/2023 - 12/25/2023

## 2023-09-13 NOTE — Telephone Encounter (Signed)
Pt left vm wanting to get 4th covid vaccine, shingles vaccine and pneumonia vaccine before colonoscopy on 09/29/23.  Advised pt spoke to [employer], PA-C and she states he can get the vaccines. Pt verbalized understanding.

## 2023-09-27 ENCOUNTER — Ambulatory Visit (HOSPITAL_COMMUNITY): Payer: Medicare PPO | Admitting: Anesthesiology

## 2023-09-27 ENCOUNTER — Other Ambulatory Visit: Payer: Self-pay

## 2023-09-27 ENCOUNTER — Encounter (HOSPITAL_COMMUNITY): Payer: Self-pay | Admitting: Internal Medicine

## 2023-09-27 ENCOUNTER — Ambulatory Visit (HOSPITAL_COMMUNITY)
Admission: RE | Admit: 2023-09-27 | Discharge: 2023-09-27 | Disposition: A | Discharge status: Home / Self Care | Payer: Medicare PPO | Attending: Internal Medicine | Admitting: Internal Medicine

## 2023-09-27 ENCOUNTER — Encounter (HOSPITAL_COMMUNITY)
Admission: RE | Disposition: A | Discharge status: Home / Self Care | Payer: Self-pay | Source: Home / Self Care | Attending: Internal Medicine

## 2023-09-27 DIAGNOSIS — D124 Benign neoplasm of descending colon: Secondary | ICD-10-CM

## 2023-09-27 DIAGNOSIS — Z1509 Genetic susceptibility to other malignant neoplasm: Secondary | ICD-10-CM | POA: Insufficient documentation

## 2023-09-27 DIAGNOSIS — Z860101 Personal history of adenomatous and serrated colon polyps: Secondary | ICD-10-CM | POA: Diagnosis not present

## 2023-09-27 DIAGNOSIS — K5730 Diverticulosis of large intestine without perforation or abscess without bleeding: Secondary | ICD-10-CM | POA: Insufficient documentation

## 2023-09-27 DIAGNOSIS — Z09 Encounter for follow-up examination after completed treatment for conditions other than malignant neoplasm: Secondary | ICD-10-CM | POA: Diagnosis not present

## 2023-09-27 DIAGNOSIS — D122 Benign neoplasm of ascending colon: Secondary | ICD-10-CM | POA: Insufficient documentation

## 2023-09-27 DIAGNOSIS — K219 Gastro-esophageal reflux disease without esophagitis: Secondary | ICD-10-CM | POA: Insufficient documentation

## 2023-09-27 DIAGNOSIS — F419 Anxiety disorder, unspecified: Secondary | ICD-10-CM | POA: Insufficient documentation

## 2023-09-27 DIAGNOSIS — Z1211 Encounter for screening for malignant neoplasm of colon: Secondary | ICD-10-CM | POA: Diagnosis not present

## 2023-09-27 DIAGNOSIS — Z87891 Personal history of nicotine dependence: Secondary | ICD-10-CM | POA: Insufficient documentation

## 2023-09-27 DIAGNOSIS — Z860100 Personal history of colon polyps, unspecified: Secondary | ICD-10-CM | POA: Diagnosis not present

## 2023-09-27 DIAGNOSIS — K635 Polyp of colon: Secondary | ICD-10-CM | POA: Diagnosis not present

## 2023-09-27 DIAGNOSIS — Z8481 Family history of carrier of genetic disease: Secondary | ICD-10-CM | POA: Diagnosis not present

## 2023-09-27 HISTORY — PX: COLONOSCOPY WITH PROPOFOL: SHX5780

## 2023-09-27 HISTORY — PX: POLYPECTOMY: SHX5525

## 2023-09-27 HISTORY — PX: HEMOSTASIS CLIP PLACEMENT: SHX6857

## 2023-09-27 SURGERY — COLONOSCOPY WITH PROPOFOL
Anesthesia: General

## 2023-09-27 MED ORDER — EPHEDRINE SULFATE-NACL 50-0.9 MG/10ML-% IV SOSY
PREFILLED_SYRINGE | INTRAVENOUS | Status: DC | PRN
  Administered 2023-09-27: 10 mg via INTRAVENOUS

## 2023-09-27 MED ORDER — MIDAZOLAM HCL 2 MG/2ML IJ SOLN
INTRAMUSCULAR | Status: AC
Start: 2023-09-27 — End: ?
  Filled 2023-09-27: qty 2

## 2023-09-27 MED ORDER — MIDAZOLAM HCL 5 MG/5ML IJ SOLN
INTRAMUSCULAR | Status: DC | PRN
  Administered 2023-09-27: 2 mg via INTRAVENOUS

## 2023-09-27 MED ORDER — PROPOFOL 10 MG/ML IV BOLUS
INTRAVENOUS | Status: DC | PRN
  Administered 2023-09-27: 100 mg via INTRAVENOUS
  Administered 2023-09-27: 50 mg via INTRAVENOUS

## 2023-09-27 MED ORDER — STERILE WATER FOR IRRIGATION IR SOLN
Status: DC | PRN
  Administered 2023-09-27: 100 mL

## 2023-09-27 MED ORDER — PHENYLEPHRINE 80 MCG/ML (10ML) SYRINGE FOR IV PUSH (FOR BLOOD PRESSURE SUPPORT)
PREFILLED_SYRINGE | INTRAVENOUS | Status: DC | PRN
  Administered 2023-09-27 (×2): 80 ug via INTRAVENOUS

## 2023-09-27 MED ORDER — PROPOFOL 500 MG/50ML IV EMUL
INTRAVENOUS | Status: DC | PRN
  Administered 2023-09-27: 150 ug/kg/min via INTRAVENOUS

## 2023-09-27 MED ORDER — LACTATED RINGERS IV SOLN: INTRAVENOUS | Status: DC | PRN

## 2023-09-27 MED ORDER — LIDOCAINE HCL (CARDIAC) PF 100 MG/5ML IV SOSY
PREFILLED_SYRINGE | INTRAVENOUS | Status: DC | PRN
  Administered 2023-09-27: 50 mg via INTRATRACHEAL

## 2023-09-27 NOTE — Discharge Instructions (Signed)
  Colonoscopy Discharge Instructions  Read the instructions outlined below and refer to this sheet in the next few weeks. These discharge instructions provide you with general information on caring for yourself after you leave the hospital. Your doctor may also give you specific instructions. While your treatment has been planned according to the most current medical practices available, unavoidable complications occasionally occur. If you have any problems or questions after discharge, call Dr. Shaaron at [phone]. ACTIVITY You may resume your regular activity, but move at a slower pace for the next 24 hours.  Take frequent rest periods for the next 24 hours.  Walking will help get rid of the air and reduce the bloated feeling in your belly (abdomen).  No driving for 24 hours (because of the medicine (anesthesia) used during the test).   Do not sign any important legal documents or operate any machinery for 24 hours (because of the anesthesia used during the test).  NUTRITION Drink plenty of fluids.  You may resume your normal diet as instructed by your doctor.  Begin with a light meal and progress to your normal diet. Heavy or fried foods are harder to digest and may make you feel sick to your stomach (nauseated).  Avoid alcoholic beverages for 24 hours or as instructed.  MEDICATIONS You may resume your normal medications unless your doctor tells you otherwise.  WHAT YOU CAN EXPECT TODAY Some feelings of bloating in the abdomen.  Passage of more gas than usual.  Spotting of blood in your stool or on the toilet paper.  IF YOU HAD POLYPS REMOVED DURING THE COLONOSCOPY: No aspirin  products for 7 days or as instructed.  No alcohol for 7 days or as instructed.  Eat a soft diet for the next 24 hours.  FINDING OUT THE RESULTS OF YOUR TEST Not all test results are available during your visit. If your test results are not back during the visit, make an appointment with your caregiver to find out the  results. Do not assume everything is normal if you have not heard from your caregiver or the medical facility. It is important for you to follow up on all of your test results.  SEEK IMMEDIATE MEDICAL ATTENTION IF: You have more than a spotting of blood in your stool.  Your belly is swollen (abdominal distention).  You are nauseated or vomiting.  You have a temperature over 101.  You have abdominal pain or discomfort that is severe or gets worse throughout the day.     10 polyps found and removed from your colon today  Diverticulosis and colon polyp information provided   further recommendations to follow pending review of pathology report   at patient request I called Eleanor Husband and reviewed findings and recommendations

## 2023-09-27 NOTE — Anesthesia Preprocedure Evaluation (Addendum)
 Anesthesia Evaluation    Airway Mallampati: III  TM Distance: >3 FB Neck ROM: Full    Dental no notable dental hx. (+) Dental Advisory Given   Pulmonary former smoker   Pulmonary exam normal breath sounds clear to auscultation       Cardiovascular Normal cardiovascular exam Rhythm:Regular Rate:Normal     Neuro/Psych  PSYCHIATRIC DISORDERS Anxiety        GI/Hepatic Neg liver ROS,GERD  ,,  Endo/Other  negative endocrine ROS    Renal/GU negative Renal ROS  negative genitourinary   Musculoskeletal  (+) Arthritis ,    Abdominal   Peds negative pediatric ROS (+)  Hematology negative hematology ROS (+)   Anesthesia Other Findings  Alcohol dependence, daily use  Lynch syndrome.  Lyme disease  Reproductive/Obstetrics negative OB ROS                             Anesthesia Physical Anesthesia Plan  ASA: 3  Anesthesia Plan: General   Post-op Pain Management: Minimal or no pain anticipated   Induction: Intravenous  PONV Risk Score and Plan: Propofol  infusion  Airway Management Planned: Nasal Cannula and Natural Airway  Additional Equipment: None  Intra-op Plan:   Post-operative Plan:   Informed Consent: I have reviewed the patients History and Physical, chart, labs and discussed the procedure including the risks, benefits and alternatives for the proposed anesthesia with the patient or authorized representative who has indicated his/her understanding and acceptance.       Plan Discussed with: CRNA  Anesthesia Plan Comments:        Anesthesia Quick Evaluation

## 2023-09-27 NOTE — H&P (Signed)
 @LOGO @   Primary Care Physician:  Bertell Satterfield, MD Primary Gastroenterologist:  Dr. Shaaron  Pre-Procedure History & Physical: HPI:  Craig Scott is a 68 y.o. male here for for surveillance colonoscopy.  History of colonic adenomas removed.  Personal history of Lynch syndrome.  Last colonoscopy yielded couple of adenomas 1 year ago.  Here for surveillance colonoscopy.  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   Tubular adenoma    Urinary frequency    Wears glasses     Past Surgical History:  Procedure Laterality Date   BASAL CELL CARCINOMA EXCISION     BIOPSY  09/04/2021   Procedure: BIOPSY;  Surgeon: Shaaron Lamar HERO, MD;  [facility]: [facility];  Service: Endoscopy;;   BIOPSY  09/16/2022   Procedure: BIOPSY;  Surgeon: Shaaron Lamar HERO, MD;  [facility]: [facility];  Service: Endoscopy;;   BUNIONECTOMY WITH HAMMERTOE RECONSTRUCTION AND GASTROC SLIDE Right 06/27/2015   Procedure: RIGHT GASTROC RECESSION/POSTERIOR TIBIAL TENOLYSIS/SECOND AND THIRD METATARSAL WEIL OSTEOTOMY AND HAMMERTOE CORRECTION;  Surgeon: Norleen Armor, MD;  Location: [facility];  Service: Orthopedics;  Laterality: Right;   CALCANEAL OSTEOTOMY Right 06/27/2015   Procedure: CALCANEAL OSTEOTOMY;  Surgeon: Norleen Armor, MD;  Location: [facility];  Service: Orthopedics;  Laterality: Right;   COLONOSCOPY  12/09/2001   RMR: Normal normal rectum/ A few scattered left-sided diverticula.  The remainder of the colonic  mucosa  appeared normal   COLONOSCOPY  12/15/2002   RMR: Normal rectum, scattered left-sided diverticula.  The remainder of the colonic mucosa appeared normal   COLONOSCOPY  04/10/2004   RMR: Normal rectum/Sigmoid diverticula/The remainder of the colonic mucosa appeared normal   COLONOSCOPY  12/23/2005   RMR: Normal rectum, scattered sigmoid diverticula Colonic mucosa appeared normal   COLONOSCOPY  01/20/2008   RMR: Distal diminutive rectal polyps, status post cold biopsy removal  otherwise, normal rectum/ biopsy removal; scattered sigmoid diverticula; and benign-appearing/ulcers about the ileocecal valve, status post biopsy.  Remainder of colonic mucosa and terminal mucosa appeared normal.   COLONOSCOPY  05/22/2010   RMR: distal diminutive rectal polyp s/p bx otherwise normal/few scattered pancolonic diverticula   COLONOSCOPY  09/16/2012   Dr.Lotta Frankenfield- normal rectum, few scattered pancolonic diverticulosis, one diminutive polyp in the base of the cecum. 3x3 area of hyper pigmentation i the mid descending segment, this was a flat benign-appearing area o/w the remainder of the colonic mucosa appeared normal. bx= tubular adenoma and benign colonic mucosa   COLONOSCOPY WITH PROPOFOL  N/A 09/05/2015   Procedure: COLONOSCOPY WITH PROPOFOL ;  Surgeon: Lamar HERO Shaaron, MD;  [facility]: [facility];  Service: Endoscopy;  Laterality: N/A;  0830   COLONOSCOPY WITH PROPOFOL  N/A 05/24/2017   Procedure: COLONOSCOPY WITH PROPOFOL ;  Surgeon: Shaaron Lamar HERO, MD;  [facility]: [facility];  Service: Endoscopy;  Laterality:  N/A;   COLONOSCOPY WITH PROPOFOL  N/A 09/04/2021   Procedure: COLONOSCOPY WITH PROPOFOL ;  Surgeon: Shaaron Lamar HERO, MD;  Location: [facility];  Service: Endoscopy;  Laterality: N/A;  1:15 / ASA III-DO NOT MOVE. Rep is coming for EXALT ERCP scope.   COLONOSCOPY WITH PROPOFOL  N/A 09/16/2022   Procedure: COLONOSCOPY WITH PROPOFOL ;  Surgeon: Shaaron Lamar HERO, MD;  [facility]: [facility];  Service: Endoscopy;   Laterality: N/A;  7:30 am   colonscopy  09/04/2021   @ [facility]   CYSTOSCOPY WITH BIOPSY N/A 09/12/2021   Procedure: CYSTOSCOPY WITH BLADDERBIOPSY/ FULGURATION/ INSTILLATION OF MARCAINE /PYRIDIUM ;  Surgeon: Nieves Cough, MD;  Location: [facility];  Service: Urology;  Laterality: N/A;   ESOPHAGOGASTRODUODENOSCOPY  12/23/2005   RMR:   Esophagogastric peptic stricture with reflux esophagitis, status post  dilation as described above/ Small hiatal hernia, otherwise normal stomach.  Bulbar erosions otherwise normal D1 and D2   ESOPHAGOGASTRODUODENOSCOPY (EGD) WITH PROPOFOL   09/04/2021   Procedure: ESOPHAGOGASTRODUODENOSCOPY (EGD) WITH PROPOFOL ;  Surgeon: Shaaron Lamar HERO, MD;  [facility];  Service: Endoscopy;;   ESOPHAGOGASTRODUODENOSCOPY (EGD) WITH PROPOFOL   09/16/2022   Procedure: ESOPHAGOGASTRODUODENOSCOPY (EGD) WITH PROPOFOL ;  Surgeon: Shaaron Lamar HERO, MD;  [facility];  Service: Endoscopy;;   EYE SURGERY  as child   FOOT ARTHRODESIS Right 06/17/2017   Procedure: Right Talonavicular and Subtalar Arthrodesis;  Surgeon: Kit Rush, MD;  Location: [facility];  Service: Orthopedics;  Laterality: Right;   HARDWARE REMOVAL Right 06/25/2016   Procedure: REMOVAL OF DEEP IMPLANTS RIGHT MEDIAL CUNEIFORM, RIGHT CALCANEAL REMOVAL OF DEEP IMPLANTS;  Surgeon: Rush Kit, MD;  Location:  [facility];  Service: Orthopedics;  Laterality: Right;   JOINT REPLACEMENT  09/30/2010   Right knee   KNEE SURGERY Right    bilat    NOSE SURGERY Left    cartiledge removal   POLYPECTOMY  09/05/2015   Procedure: POLYPECTOMY;  Surgeon: Lamar HERO Shaaron, MD;  [facility];  Service: Endoscopy;;   POLYPECTOMY  05/24/2017   Procedure: POLYPECTOMY;  Surgeon: Shaaron Lamar HERO, MD;  [facility];  Service: Endoscopy;;  colon   POLYPECTOMY  09/04/2021   Procedure: POLYPECTOMY;  Surgeon: Shaaron Lamar HERO, MD;  [facility];   Service: Endoscopy;;   POLYPECTOMY  09/16/2022   Procedure: POLYPECTOMY;  Surgeon: Shaaron Lamar HERO, MD;  [facility];  Service: Endoscopy;;   RHINOPLASTY     TOTAL HIP ARTHROPLASTY Left 03/20/2015   Procedure: LEFT TOTAL HIP ARTHROPLASTY ANTERIOR APPROACH;  Surgeon: Dempsey Moan, MD;  Location: [facility];  Service: Orthopedics;  Laterality: Left;   TOTAL KNEE ARTHROPLASTY Left 10/23/2013   Procedure: LEFT TOTAL KNEE ARTHROPLASTY;  Surgeon: Dempsey LULLA Moan, MD;  Location: [facility];  Service: Orthopedics;  Laterality: Left;   TYMPANOSTOMY TUBE PLACEMENT Right    early 2014 and multiple times   TYMPANOSTOMY TUBE PLACEMENT      Prior to Admission medications   Medication Sig Start Date End Date Taking? Authorizing Provider  ALPRAZolam  (XANAX ) 1 MG tablet Take 1 mg by mouth 4 (four) times daily as needed for anxiety.   Yes [provider]  aspirin  EC 81 MG tablet Take 1 tablet (81 mg total) by mouth 2 (two) times daily. 06/17/17  Yes Aniceto Eva Grebe, PA-C  cetirizine (ZYRTEC) 10 MG tablet Take 10 mg by mouth daily.   Yes [provider]  Cholecalciferol (VITAMIN D3) 125 MCG (5000  UT) CAPS Take 5,000 Units by mouth daily.   Yes [provider]  ENBREL SURECLICK 50 MG/ML injection Inject into the skin. 09/09/23  Yes [provider]  escitalopram (LEXAPRO) 10 MG tablet Take 10 mg by mouth daily. 04/14/22  Yes [provider]  methocarbamol  (ROBAXIN ) 500 MG tablet Take 1 tablet (500 mg total) by mouth 2 (two) times daily. 02/24/23  Yes Prosperi, Christian H, PA-C  pantoprazole  (PROTONIX ) 40 MG tablet TAKE ONE TABLET (40MG  TOTAL) BY MOUTH TWO TIMES DAILY BEFORE A MEAL Patient taking differently: Take 40 mg by mouth 2 (two) times daily. 08/16/23  Yes RourkLamar HERO, MD  Potassium 99 MG TABS Take 99 mg by mouth daily.   Yes [provider]  simvastatin  (ZOCOR ) 20 MG tablet Take 20 mg by mouth at bedtime.   Yes [provider]   solifenacin (VESICARE) 5 MG tablet Take 5 mg by mouth daily. 08/31/23  Yes [provider]  tamsulosin (FLOMAX) 0.4 MG CAPS capsule Take by mouth. 09/02/23  Yes [provider]  zinc gluconate 50 MG tablet Take 50 mg by mouth daily.   Yes [provider]  donepezil (ARICEPT) 5 MG tablet Take 5 mg by mouth at bedtime. 04/14/22   [provider]  etanercept (ENBREL) 50 MG/ML injection Inject into the skin.    [provider]  Misc Natural Products (GLUCOSAMINE CHOND MSM FORMULA) TABS Take 1 tablet by mouth daily.    [provider]  tadalafil  (CIALIS ) 5 MG tablet Take 1 tablet (5 mg total) by mouth daily. 02/15/23   Nieves Cough, MD    Allergies as of 09/13/2023 - Review Complete 09/10/2023  Allergen Reaction Noted   Doxycycline   06/30/2022   Lyrica [pregabalin] Other (See Comments) 03/14/2015    Family History  Problem Relation Age of Onset   Colon cancer Brother        58    Colon cancer Brother        28   Colon cancer Mother        age 75    Dementia Mother    Arthritis Mother    Heart attack Father    Colon cancer Cousin        59 years old onset   Colon cancer Cousin        49 years onset    Social History   Socioeconomic History   Marital status: Married    Spouse name: Not on file   Number of children: 2   Years of education: 12   Highest education level: Not on file  Occupational History   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: [employer]    Comment: [employer]  Tobacco Use   Smoking status: Former    Current packs/day: 0.00    Average packs/day: 0.5 packs/day for 30.0 years (15.0 ttl pk-yrs)    Types: Cigars, Cigarettes    Start date: 09/15/1975    Quit date: 09/14/2005    Years since quitting: 18.0   Smokeless tobacco: Never  Vaping Use   Vaping status: Never Used  Substance and Sexual Activity   Alcohol use: Yes    Comment: 4-5 beer per day or none for a couple of days   Drug use: Yes    Types:  Marijuana    Comment: no marijuana use in 25 yrs per pt on 09-09-2021/smokes occasionally   Sexual activity: Yes    Birth control/protection: None  Other Topics Concern  Not on file  Social History Narrative   ** Merged History Encounter **       Social Drivers of Corporate Investment Banker Strain: Not on file  Food Insecurity: Not on file  Transportation Needs: Not on file  Physical Activity: Not on file  Stress: Not on file  Social Connections: Not on file  Intimate Partner Violence: Not on file    Review of Systems: See HPI, otherwise negative ROS  Physical Exam: BP (!) 135/102   Pulse 82   Temp 97.8 F (36.6 C) (Oral)   Resp 16   Ht 5' 11 (1.803 m)   Wt 98.9 kg   SpO2 98%   BMI 30.40 kg/m  General:   Alert,  Well-developed, well-nourished, pleasant and cooperative in NAD Neck:  Supple; no masses or thyromegaly. No significant cervical adenopathy. Lungs:  Clear throughout to auscultation.   No wheezes, crackles, or rhonchi. No acute distress. Heart:  Regular rate and rhythm; no murmurs, clicks, rubs,  or gallops. Abdomen: Non-distended, normal bowel sounds.  Soft and nontender without appreciable mass or hepatosplenomegaly.   Impression/Plan: 68 year old gentleman with Lynch syndrome.  Personal history colonic adenomas.  Patient here for high risk screening /surveillance colonoscopy.   The risks, benefits, limitations, alternatives and imponderables have been reviewed with the patient. Questions have been answered. All parties are agreeable.         Notice: This dictation was prepared with Dragon dictation along with smaller phrase technology. Any transcriptional errors that result from this process are unintentional and may not be corrected upon review.

## 2023-09-27 NOTE — Transfer of Care (Signed)
 Immediate Anesthesia Transfer of Care Note  Patient: Craig Scott  Procedure(s) Performed: COLONOSCOPY WITH PROPOFOL  POLYPECTOMY HEMOSTASIS CLIP PLACEMENT  Patient Location: Endoscopy Unit  Anesthesia Type:General  Level of Consciousness: awake  Airway & Oxygen Therapy: Patient Spontanous Breathing  Post-op Assessment: Report given to RN  Post vital signs: Reviewed and stable  Last Vitals:  Vitals Value Taken Time  BP    Temp    Pulse    Resp    SpO2      Last Pain:  Vitals:   09/27/23 1003  TempSrc:   PainSc: 0-No pain         Complications: No notable events documented.

## 2023-09-27 NOTE — Interval H&P Note (Signed)
 History and Physical Interval Note:  09/27/2023 9:41 AM  Craig Scott  has presented today for surgery, with the diagnosis of history of colon polyps, lynch syndrome.  The various methods of treatment have been discussed with the patient and family. After consideration of risks, benefits and other options for treatment, the patient has consented to  Procedure(s) with comments: COLONOSCOPY WITH PROPOFOL  (N/A) - 9:45 am, asa 2 as a surgical intervention.  The patient's history has been reviewed, patient examined, no change in status, stable for surgery.  I have reviewed the patient's chart and labs.  Questions were answered to the patient's satisfaction.     Craig Scott

## 2023-09-27 NOTE — Anesthesia Postprocedure Evaluation (Signed)
 Anesthesia Post Note  Patient: Craig Scott  Procedure(s) Performed: COLONOSCOPY WITH PROPOFOL  POLYPECTOMY HEMOSTASIS CLIP PLACEMENT  Patient location during evaluation: Endoscopy Anesthesia Type: General Level of consciousness: awake and alert Pain management: pain level controlled Vital Signs Assessment: post-procedure vital signs reviewed and stable Respiratory status: spontaneous breathing Cardiovascular status: blood pressure returned to baseline and stable Postop Assessment: no apparent nausea or vomiting Anesthetic complications: no   No notable events documented.   Last Vitals:  Vitals:   09/27/23 0825 09/27/23 1057  BP: (!) 135/102 (!) 70/45  Pulse: 82 67  Resp: 16 15  Temp: 36.6 C 36.4 C  SpO2: 98% 95%    Last Pain:  Vitals:   09/27/23 1057  TempSrc: Oral  PainSc:                  Inari Shin

## 2023-09-27 NOTE — Op Note (Signed)
 [facility] Patient Name: Craig Scott Procedure Date: 09/27/2023 9:34 AM MRN: [id number] Date of Birth: 09/28/1955 Attending MD: Lamar Ozell Hollingshead , MD, [id number] CSN: [id number] Age: 68 Admit Type: Outpatient Procedure:                Colonoscopy Indications:              High risk colon cancer surveillance: Personal                            history of colonic polyps, Family history of                            familial adenomatous polyposis Providers:                Lamar Ozell Hollingshead, MD, Devere Lodge, Nidia Oak Referring MD:              Medicines:                Propofol  per Anesthesia Complications:            No immediate complications. Estimated Blood Loss:     Estimated blood loss was minimal. Procedure:                Pre-Anesthesia Assessment:                           - Prior to the procedure, a History and Physical                            was performed, and patient medications and                            allergies were reviewed. The patient's tolerance of                            previous anesthesia was also reviewed. The risks                            and benefits of the procedure and the sedation                            options and risks were discussed with the patient.                            All questions were answered, and informed consent                            was obtained. ASA Grade Assessment: II - A patient                            with mild systemic disease. After reviewing the  risks and benefits, the patient was deemed in                            satisfactory condition to undergo the procedure.                           After obtaining informed consent, the colonoscope                            was passed under direct vision. Throughout the                            procedure, the patient's blood pressure, pulse, and                            oxygen  saturations were monitored continuously. The                            [id number]) scope was introduced through the                            anus and advanced to the the cecum, identified by                            appendiceal orifice and ileocecal valve. The                            colonoscopy was performed without difficulty. The                            patient tolerated the procedure well. The quality                            of the bowel preparation was adequate. The                            ileocecal valve, appendiceal orifice, and rectum                            were photographed. Scope In: 10:09:54 AM Scope Out: 10:51:29 AM Scope Withdrawal Time: 0 hours 37 minutes 31 seconds  Total Procedure Duration: 0 hours 41 minutes 35 seconds  Findings:      The perianal and digital rectal examinations were normal.      Multiple medium-mouthed diverticula were found in the entire colon.      Eight semi-pedunculated polyps were found in the ascending colon. The       polyps were 4 to 5 mm in size. These polyps were removed with a cold       snare. Resection and retrieval were complete. Estimated blood loss was       minimal.      These polyps were removed with a cold snare. Resection and retrieval       were complete. Estimated blood loss was minimal.      Two semi-pedunculated polyps were found in the descending colon and  ascending colon. The polyps were 10 to 14 mm in size. These polyps were       removed with a hot snare. Resection and retrieval were complete.       Estimated blood loss was minimal. The largest polyp is in the ascending       segment. He was piecemeal removed hot snare. Polypectomy site was closed       with 1 mantis clip and 1 resolution clip.      The exam was otherwise without abnormality on direct and retroflexion       views. Impression:               - Diverticulosis in the entire examined colon.                           - Eight 4 to  5 mm polyps in the ascending colon,                            removed with a cold snare. Resected and retrieved.                           - Resected and retrieved.                           - Two 10 to 14 mm polyps in the descending colon                            and in the ascending colon, removed with a hot                            snare. Resected and retrieved. Clips x 2 largest                            ascending polyp.                           - The examination was otherwise normal on direct                            and retroflexion views. Moderate Sedation:      Moderate (conscious) sedation was personally administered by an       anesthesia professional. The following parameters were monitored: oxygen       saturation, heart rate, blood pressure, respiratory rate, EKG, adequacy       of pulmonary ventilation, and response to care. Recommendation:           - Patient has a contact number available for                            emergencies. The signs and symptoms of potential                            delayed complications were discussed with the                            patient. Return to  normal activities tomorrow.                            Written discharge instructions were provided to the                            patient.                           - Advance diet as tolerated.                           - Continue present medications.                           - Repeat colonoscopy date to be determined after                            pending pathology results are reviewed for                            surveillance.                           - Return to GI office in 6 weeks. Patient will at a                            minimum need a repeat colonoscopy in 6 months. Procedure Code(s):        --- Professional ---                           [phone], Colonoscopy, flexible; with removal of                            tumor(s), polyp(s), or other lesion(s) by snare                             technique Diagnosis Code(s):        --- Professional ---                           Z86.010, Personal history of colonic polyps                           D12.4, Benign neoplasm of descending colon                           D12.2, Benign neoplasm of ascending colon                           Z83.71, Family history of colonic polyps                           Z84.81, Family history of carrier of genetic disease  K57.30, Diverticulosis of large intestine without                            perforation or abscess without bleeding CPT copyright 2022 American Medical Association. All rights reserved. The codes documented in this report are preliminary and upon coder review may  be revised to meet current compliance requirements. Lamar HERO. Lindsea Olivar, MD Lamar Ozell Hollingshead, MD 09/27/2023 11:05:21 AM This report has been signed electronically. Number of Addenda: 0

## 2023-09-28 ENCOUNTER — Encounter: Payer: Self-pay | Admitting: Internal Medicine

## 2023-09-28 ENCOUNTER — Telehealth: Payer: Self-pay

## 2023-09-28 ENCOUNTER — Encounter (HOSPITAL_COMMUNITY): Payer: Self-pay | Admitting: Internal Medicine

## 2023-09-28 LAB — SURGICAL PATHOLOGY

## 2023-09-28 NOTE — Telephone Encounter (Signed)
 Pt called stating that he has had a tiny bit of bleeding and is wanting to know if that is normal. Pt had a colonoscopy yesterday. Please advise.

## 2023-09-29 NOTE — Telephone Encounter (Signed)
 Pt was made aware and verbalized understanding.

## 2023-10-05 DIAGNOSIS — D225 Melanocytic nevi of trunk: Secondary | ICD-10-CM | POA: Diagnosis not present

## 2023-10-05 DIAGNOSIS — R7989 Other specified abnormal findings of blood chemistry: Secondary | ICD-10-CM | POA: Diagnosis not present

## 2023-10-05 DIAGNOSIS — D485 Neoplasm of uncertain behavior of skin: Secondary | ICD-10-CM | POA: Diagnosis not present

## 2023-10-05 DIAGNOSIS — D235 Other benign neoplasm of skin of trunk: Secondary | ICD-10-CM | POA: Diagnosis not present

## 2023-10-05 DIAGNOSIS — F419 Anxiety disorder, unspecified: Secondary | ICD-10-CM | POA: Diagnosis not present

## 2023-10-05 DIAGNOSIS — M069 Rheumatoid arthritis, unspecified: Secondary | ICD-10-CM | POA: Diagnosis not present

## 2023-10-05 DIAGNOSIS — I700 Atherosclerosis of aorta: Secondary | ICD-10-CM | POA: Diagnosis not present

## 2023-10-05 DIAGNOSIS — L308 Other specified dermatitis: Secondary | ICD-10-CM | POA: Diagnosis not present

## 2023-10-05 DIAGNOSIS — L720 Epidermal cyst: Secondary | ICD-10-CM | POA: Diagnosis not present

## 2023-10-05 DIAGNOSIS — Z1283 Encounter for screening for malignant neoplasm of skin: Secondary | ICD-10-CM | POA: Diagnosis not present

## 2023-10-07 DIAGNOSIS — M1991 Primary osteoarthritis, unspecified site: Secondary | ICD-10-CM | POA: Diagnosis not present

## 2023-10-07 DIAGNOSIS — M79671 Pain in right foot: Secondary | ICD-10-CM | POA: Diagnosis not present

## 2023-10-07 DIAGNOSIS — R5382 Chronic fatigue, unspecified: Secondary | ICD-10-CM | POA: Diagnosis not present

## 2023-10-07 DIAGNOSIS — Z6829 Body mass index (BMI) 29.0-29.9, adult: Secondary | ICD-10-CM | POA: Diagnosis not present

## 2023-10-07 DIAGNOSIS — M0579 Rheumatoid arthritis with rheumatoid factor of multiple sites without organ or systems involvement: Secondary | ICD-10-CM | POA: Diagnosis not present

## 2023-10-07 DIAGNOSIS — E663 Overweight: Secondary | ICD-10-CM | POA: Diagnosis not present

## 2023-10-15 DIAGNOSIS — N400 Enlarged prostate without lower urinary tract symptoms: Secondary | ICD-10-CM | POA: Diagnosis not present

## 2023-10-15 DIAGNOSIS — E782 Mixed hyperlipidemia: Secondary | ICD-10-CM | POA: Diagnosis not present

## 2023-10-25 DIAGNOSIS — R3915 Urgency of urination: Secondary | ICD-10-CM | POA: Diagnosis not present

## 2023-10-25 DIAGNOSIS — R310 Gross hematuria: Secondary | ICD-10-CM | POA: Diagnosis not present

## 2023-10-26 DIAGNOSIS — D2262 Melanocytic nevi of left upper limb, including shoulder: Secondary | ICD-10-CM | POA: Diagnosis not present

## 2023-10-26 DIAGNOSIS — D485 Neoplasm of uncertain behavior of skin: Secondary | ICD-10-CM | POA: Diagnosis not present

## 2023-10-26 DIAGNOSIS — L738 Other specified follicular disorders: Secondary | ICD-10-CM | POA: Diagnosis not present

## 2023-10-29 DIAGNOSIS — N289 Disorder of kidney and ureter, unspecified: Secondary | ICD-10-CM | POA: Diagnosis not present

## 2023-10-29 DIAGNOSIS — R310 Gross hematuria: Secondary | ICD-10-CM | POA: Diagnosis not present

## 2023-10-29 DIAGNOSIS — K5730 Diverticulosis of large intestine without perforation or abscess without bleeding: Secondary | ICD-10-CM | POA: Diagnosis not present

## 2023-11-23 DIAGNOSIS — R3915 Urgency of urination: Secondary | ICD-10-CM | POA: Diagnosis not present

## 2023-11-23 DIAGNOSIS — N5201 Erectile dysfunction due to arterial insufficiency: Secondary | ICD-10-CM | POA: Diagnosis not present

## 2023-12-22 DIAGNOSIS — M069 Rheumatoid arthritis, unspecified: Secondary | ICD-10-CM | POA: Diagnosis not present

## 2023-12-22 DIAGNOSIS — M19279 Secondary osteoarthritis, unspecified ankle and foot: Secondary | ICD-10-CM | POA: Diagnosis not present

## 2023-12-22 DIAGNOSIS — M79609 Pain in unspecified limb: Secondary | ICD-10-CM | POA: Diagnosis not present

## 2023-12-22 DIAGNOSIS — M1990 Unspecified osteoarthritis, unspecified site: Secondary | ICD-10-CM | POA: Diagnosis not present

## 2023-12-22 DIAGNOSIS — R202 Paresthesia of skin: Secondary | ICD-10-CM | POA: Diagnosis not present

## 2023-12-22 DIAGNOSIS — M21969 Unspecified acquired deformity of unspecified lower leg: Secondary | ICD-10-CM | POA: Diagnosis not present

## 2024-01-20 DIAGNOSIS — N390 Urinary tract infection, site not specified: Secondary | ICD-10-CM | POA: Diagnosis not present

## 2024-01-20 DIAGNOSIS — M545 Low back pain, unspecified: Secondary | ICD-10-CM | POA: Diagnosis not present

## 2024-01-20 DIAGNOSIS — E663 Overweight: Secondary | ICD-10-CM | POA: Diagnosis not present

## 2024-01-20 DIAGNOSIS — M069 Rheumatoid arthritis, unspecified: Secondary | ICD-10-CM | POA: Diagnosis not present

## 2024-01-20 DIAGNOSIS — Z6829 Body mass index (BMI) 29.0-29.9, adult: Secondary | ICD-10-CM | POA: Diagnosis not present

## 2024-01-20 DIAGNOSIS — R7989 Other specified abnormal findings of blood chemistry: Secondary | ICD-10-CM | POA: Diagnosis not present

## 2024-01-20 DIAGNOSIS — N401 Enlarged prostate with lower urinary tract symptoms: Secondary | ICD-10-CM | POA: Diagnosis not present

## 2024-01-26 ENCOUNTER — Encounter: Payer: Self-pay | Admitting: *Deleted

## 2024-01-27 DIAGNOSIS — R3912 Poor urinary stream: Secondary | ICD-10-CM | POA: Diagnosis not present

## 2024-01-27 DIAGNOSIS — R3915 Urgency of urination: Secondary | ICD-10-CM | POA: Diagnosis not present

## 2024-02-17 DIAGNOSIS — Z6828 Body mass index (BMI) 28.0-28.9, adult: Secondary | ICD-10-CM | POA: Diagnosis not present

## 2024-02-17 DIAGNOSIS — E663 Overweight: Secondary | ICD-10-CM | POA: Diagnosis not present

## 2024-02-17 DIAGNOSIS — M0579 Rheumatoid arthritis with rheumatoid factor of multiple sites without organ or systems involvement: Secondary | ICD-10-CM | POA: Diagnosis not present

## 2024-02-17 DIAGNOSIS — M1991 Primary osteoarthritis, unspecified site: Secondary | ICD-10-CM | POA: Diagnosis not present

## 2024-02-17 DIAGNOSIS — R5382 Chronic fatigue, unspecified: Secondary | ICD-10-CM | POA: Diagnosis not present

## 2024-02-17 DIAGNOSIS — M79671 Pain in right foot: Secondary | ICD-10-CM | POA: Diagnosis not present

## 2024-02-21 DIAGNOSIS — E291 Testicular hypofunction: Secondary | ICD-10-CM | POA: Diagnosis not present

## 2024-02-21 DIAGNOSIS — M069 Rheumatoid arthritis, unspecified: Secondary | ICD-10-CM | POA: Diagnosis not present

## 2024-02-21 DIAGNOSIS — F419 Anxiety disorder, unspecified: Secondary | ICD-10-CM | POA: Diagnosis not present

## 2024-02-21 DIAGNOSIS — Z6829 Body mass index (BMI) 29.0-29.9, adult: Secondary | ICD-10-CM | POA: Diagnosis not present

## 2024-02-21 DIAGNOSIS — E663 Overweight: Secondary | ICD-10-CM | POA: Diagnosis not present

## 2024-02-21 DIAGNOSIS — M545 Low back pain, unspecified: Secondary | ICD-10-CM | POA: Diagnosis not present

## 2024-03-06 ENCOUNTER — Encounter (HOSPITAL_COMMUNITY): Payer: Self-pay

## 2024-03-06 ENCOUNTER — Emergency Department (HOSPITAL_COMMUNITY)

## 2024-03-06 ENCOUNTER — Other Ambulatory Visit: Payer: Self-pay

## 2024-03-06 ENCOUNTER — Emergency Department (HOSPITAL_COMMUNITY)
Admission: EM | Admit: 2024-03-06 | Discharge: 2024-03-06 | Disposition: A | Discharge status: Home / Self Care | Attending: Emergency Medicine | Admitting: Emergency Medicine

## 2024-03-06 DIAGNOSIS — R110 Nausea: Secondary | ICD-10-CM | POA: Insufficient documentation

## 2024-03-06 DIAGNOSIS — Z7982 Long term (current) use of aspirin: Secondary | ICD-10-CM | POA: Diagnosis not present

## 2024-03-06 DIAGNOSIS — R0789 Other chest pain: Secondary | ICD-10-CM | POA: Insufficient documentation

## 2024-03-06 DIAGNOSIS — R079 Chest pain, unspecified: Secondary | ICD-10-CM | POA: Diagnosis not present

## 2024-03-06 LAB — CBC WITH DIFFERENTIAL/PLATELET
Abs Immature Granulocytes: 0 10*3/uL (ref 0.00–0.07)
Basophils Absolute: 0 10*3/uL (ref 0.0–0.1)
Basophils Relative: 1 %
Eosinophils Absolute: 0 10*3/uL (ref 0.0–0.5)
Eosinophils Relative: 0 %
HCT: 40.3 % (ref 39.0–52.0)
Hemoglobin: 14.3 g/dL (ref 13.0–17.0)
Lymphocytes Relative: 42 %
Lymphs Abs: 1.9 10*3/uL (ref 0.7–4.0)
MCH: 31 pg (ref 26.0–34.0)
MCHC: 35.5 g/dL (ref 30.0–36.0)
MCV: 87.2 fL (ref 80.0–100.0)
Monocytes Absolute: 0.6 10*3/uL (ref 0.1–1.0)
Monocytes Relative: 13 %
Neutro Abs: 2 10*3/uL (ref 1.7–7.7)
Neutrophils Relative %: 44 %
Platelets: 155 10*3/uL (ref 150–400)
RBC: 4.62 MIL/uL (ref 4.22–5.81)
RDW: 12.8 % (ref 11.5–15.5)
WBC: 4.6 10*3/uL (ref 4.0–10.5)
nRBC: 0 % (ref 0.0–0.2)

## 2024-03-06 LAB — TROPONIN I (HIGH SENSITIVITY)
Troponin I (High Sensitivity): 3 ng/L (ref <18)
Troponin I (High Sensitivity): 3 ng/L (ref <18)

## 2024-03-06 LAB — COMPREHENSIVE METABOLIC PANEL WITH GFR
ALT: 35 U/L (ref 0–44)
AST: 29 U/L (ref 15–41)
Albumin: 3.8 g/dL (ref 3.5–5.0)
Alkaline Phosphatase: 72 U/L (ref 38–126)
Anion gap: 11 (ref 5–15)
BUN: 10 mg/dL (ref 8–23)
CO2: 27 mmol/L (ref 22–32)
Calcium: 9.1 mg/dL (ref 8.9–10.3)
Chloride: 102 mmol/L (ref 98–111)
Creatinine, Ser: 0.8 mg/dL (ref 0.61–1.24)
GFR, Estimated: >60 mL/min (ref >60)
Glucose, Bld: 113 mg/dL — ABNORMAL HIGH (ref 70–99)
Potassium: 4.1 mmol/L (ref 3.5–5.1)
Sodium: 140 mmol/L (ref 135–145)
Total Bilirubin: 0.8 mg/dL (ref 0.0–1.2)
Total Protein: 6.6 g/dL (ref 6.5–8.1)

## 2024-03-06 MED ORDER — NITROGLYCERIN 0.4 MG SL SUBL
0.4000 mg | SUBLINGUAL_TABLET | Freq: Once | SUBLINGUAL | Status: AC
  Administered 2024-03-06: 0.4 mg via SUBLINGUAL
  Filled 2024-03-06: qty 1

## 2024-03-06 MED ORDER — ASPIRIN 81 MG PO CHEW
243.0000 mg | CHEWABLE_TABLET | Freq: Once | ORAL | Status: AC
  Administered 2024-03-06: 243 mg via ORAL
  Filled 2024-03-06: qty 3

## 2024-03-06 NOTE — Discharge Instructions (Signed)
You were seen for your chest pain in the emergency department.   At home, please take tylenol for your pain.    Follow-up with your primary doctor in 2-3 days regarding your visit.  Cardiology will be calling you regarding an appointment within the next 72 hours.  You may contact them if you do not hear from them in that time using the information in this packet.  Return immediately to the emergency department if you experience any of the following: Worsening pain, difficulty breathing, unexplained vomiting or sweating, or any other concerning symptoms.    Thank you for visiting our Emergency Department. It was a pleasure taking care of you today.

## 2024-03-06 NOTE — ED Provider Notes (Signed)
 [place] EMERGENCY DEPARTMENT AT [facility] Provider Note   CSN: [id number] Arrival date & time: 03/06/24  1032     Patient presents with: Chest Pain   Craig Scott is a 68 y.o. male.   68 year old male with a history of hyperlipidemia and rheumatoid arthritis who presents emergency department chest pain.  Patient reports he started having severe chest pain this morning at 6 AM when he woke up.  Left-sided.  Radiates down his left arm.  Not exertional or pleuritic.  Nausea but no vomiting.  No shortness of breath.  No personal history of MI or stents.  Did take 81 mg aspirin  this morning.  Did not take any other medications for it.  Has not had his tadalafil  in months.       Prior to Admission medications   Medication Sig Start Date End Date Taking? Authorizing Provider  ALPRAZolam  (XANAX ) 1 MG tablet Take 1 mg by mouth 4 (four) times daily as needed for anxiety.   Yes [provider]  aspirin  EC 81 MG tablet Take 1 tablet (81 mg total) by mouth 2 (two) times daily. 06/17/17  Yes Aniceto Eva Grebe, PA-C  celecoxib (CELEBREX) 200 MG capsule Take 200 mg by mouth daily.   Yes [provider]  cetirizine (ZYRTEC) 10 MG tablet Take 10 mg by mouth daily.   Yes [provider]  Cholecalciferol (VITAMIN D3) 125 MCG (5000 UT) CAPS Take 5,000 Units by mouth daily.   Yes [provider]  cyclobenzaprine (FLEXERIL) 10 MG tablet Take 10 mg by mouth 3 (three) times daily. 02/21/24  Yes [provider]  ENBREL SURECLICK 50 MG/ML injection Inject 50 mg into the skin once a week. 09/09/23  Yes [provider]  Potassium 99 MG TABS Take 99 mg by mouth daily.   Yes [provider]  pregabalin (LYRICA) 25 MG capsule Take 25 mg by mouth daily.   Yes [provider]  simvastatin  (ZOCOR ) 20 MG tablet Take 20 mg by mouth daily.   Yes [provider]  solifenacin (VESICARE) 5 MG tablet Take 5 mg by mouth daily.  08/31/23  Yes [provider]  tamsulosin (FLOMAX) 0.4 MG CAPS capsule Take 0.4 mg by mouth daily. 09/02/23  Yes [provider]    Allergies: Doxycycline  and Lyrica [pregabalin]    Review of Systems  Updated Vital Signs BP 117/77   Pulse 67   Temp 97.6 F (36.4 C) (Temporal)   Resp 13   Ht 5' 11 (1.803 m)   Wt 96.6 kg   SpO2 95%   BMI 29.71 kg/m   Physical Exam Vitals and nursing note reviewed.  Constitutional:      General: He is not in acute distress.    Appearance: He is well-developed.  HENT:     Head: Normocephalic and atraumatic.     Right Ear: External ear normal.     Left Ear: External ear normal.     Nose: Nose normal.   Eyes:     Extraocular Movements: Extraocular movements intact.     Conjunctiva/sclera: Conjunctivae normal.     Pupils: Pupils are equal, round, and reactive to light.    Cardiovascular:     Rate and Rhythm: Normal rate and regular rhythm.     Heart sounds: Normal heart sounds.     Comments: Chest pain not reproducible.  Radial pulses 2+ bilaterally. Pulmonary:     Effort: Pulmonary effort is normal. No respiratory distress.  Breath sounds: Normal breath sounds.   Musculoskeletal:     Cervical back: Normal range of motion and neck supple.     Right lower leg: No edema.     Left lower leg: No edema.   Skin:    General: Skin is warm and dry.   Neurological:     Mental Status: He is alert. Mental status is at baseline.   Psychiatric:        Mood and Affect: Mood normal.        Behavior: Behavior normal.     (all labs ordered are listed, but only abnormal results are displayed) Labs Reviewed  COMPREHENSIVE METABOLIC PANEL WITH GFR - Abnormal; Notable for the following components:      Result Value   Glucose, Bld 113 (*)    All other components within normal limits  CBC WITH DIFFERENTIAL/PLATELET  TROPONIN I (HIGH SENSITIVITY)  TROPONIN I (HIGH SENSITIVITY)    EKG: EKG  Interpretation Date/Time:  Monday March 06 2024 11:45:19 EDT Ventricular Rate:  69 PR Interval:  189 QRS Duration:  91 QT Interval:  391 QTC Calculation: 419 R Axis:   30  Text Interpretation: Sinus rhythm Abnormal R-wave progression, early transition Confirmed by Yolande Charleston [phone]) on 03/06/2024 11:55:59 AM  Radiology: [facility] Chest 2 View Result Date: 03/06/2024 CLINICAL DATA:  Chest pain EXAM: CHEST - 2 VIEW COMPARISON:  09/14/2018 FINDINGS: Lungs are clear.  No pneumothorax. Heart size and mediastinal contours are within normal limits. Aortic Atherosclerosis (ICD10-170.0). No effusion. Visualized bones unremarkable. IMPRESSION: No acute cardiopulmonary disease. Electronically Signed   By: JONETTA Faes M.D.   On: 03/06/2024 12:44     Procedures   Medications Ordered in the ED  aspirin  chewable tablet 243 mg (243 mg Oral Given 03/06/24 1118)  nitroGLYCERIN (NITROSTAT) SL tablet 0.4 mg (0.4 mg Sublingual Given 03/06/24 1119)                                    Medical Decision Making Amount and/or Complexity of Data Reviewed Labs: ordered. Radiology: ordered.  Risk OTC drugs. Prescription drug management.   Craig Scott is a 68 y.o. male with comorbidities that complicate the patient evaluation including   Hyperlipidemia and rheumatoid arthritis who presents emergency department chest pain Initial Ddx:  MI, PE, pneumonia, dissection, pericarditis, costochondritis, reflux  MDM:  With the patient's chest discomfort will obtain EKG and troponins to evaluate for MI.  Also considering pulmonary embolism but patient is not having any respiratory symptoms at this time so feel less likely.  Also went back and talked to the patient and he does not have a history of DVT or PE, cancer, or recent surgery.  Considered dissection but with their symmetric pulses, history, and description of the pain feel it is less likely.  If chest x-ray reveals widened mediastinum or any other concerning  findings will consider CTA.  Also considered pericarditis but description is unlikely and they do not have risk factors for this diagnosis.  Chest pain not reproducible so feel it costochondritis less likely.  No infectious symptoms to suggest pneumonia at this time that would be causing pleuritic chest pain.  Plan:  Labs Troponin EKG Chest x-ray Aspirin  Nitroglycerin  ED Summary/Re-evaluation:  Patient reassessed and reported that his pain improved.  Workup was reassuring and he had serial EKGs without concerning findings.  Troponin was stable at 3 x2.  Feel that  MI is highly unlikely at this time especially given the fact that his high-sensitivity troponins have remained totally flat at 3.  Performed shared decision-making with the patient regarding admission versus discharge.  He opted to go home.  Will have him follow-up with cardiology as an outpatient given his risk factors to see if any additional testing is warranted.  This patient presents to the ED for concern of complaints listed in HPI, this involves an extensive number of treatment options, and is a complaint that carries with it a high risk of complications and morbidity. Disposition including potential need for admission considered.   Dispo: DC Home. Return precautions discussed including, but not limited to, those listed in the AVS. Allowed pt time to ask questions which were answered fully prior to dc.  Additional history obtained from family Records reviewed Outpatient Clinic Notes The following labs were independently interpreted: Serial Troponins and show no acute abnormality I independently reviewed the following imaging with scope of interpretation limited to determining acute life threatening conditions related to emergency care: Chest x-ray and agree with the radiologist interpretation with the following exceptions: none I personally reviewed and interpreted cardiac monitoring: normal sinus rhythm  I personally reviewed  and interpreted the pt's EKG: see above for interpretation  I have reviewed the patients home medications and made adjustments as needed Social Determinants of health:  Geriatric   Final diagnoses:  Nonspecific chest pain    ED Discharge Orders          Ordered    Ambulatory referral to Cardiology        03/06/24 1356               Yolande Lamar BROCKS, MD 03/06/24 1639

## 2024-03-06 NOTE — ED Triage Notes (Signed)
 Pt arrived via POV c/o left side chest pain that began this morning. Pt endorses nausea w/o emesis.

## 2024-03-07 ENCOUNTER — Other Ambulatory Visit: Payer: Self-pay | Admitting: *Deleted

## 2024-03-07 ENCOUNTER — Telehealth: Payer: Self-pay | Admitting: *Deleted

## 2024-03-07 ENCOUNTER — Encounter: Payer: Self-pay | Admitting: *Deleted

## 2024-03-07 ENCOUNTER — Encounter: Payer: Self-pay | Admitting: Internal Medicine

## 2024-03-07 ENCOUNTER — Ambulatory Visit: Admitting: Internal Medicine

## 2024-03-07 VITALS — BP 112/71 | HR 77 | Temp 98.2°F | Ht 71.0 in | Wt 215.0 lb

## 2024-03-07 DIAGNOSIS — Z09 Encounter for follow-up examination after completed treatment for conditions other than malignant neoplasm: Secondary | ICD-10-CM | POA: Diagnosis not present

## 2024-03-07 DIAGNOSIS — Z1509 Genetic susceptibility to other malignant neoplasm: Secondary | ICD-10-CM

## 2024-03-07 DIAGNOSIS — Z860100 Personal history of colon polyps, unspecified: Secondary | ICD-10-CM

## 2024-03-07 DIAGNOSIS — K257 Chronic gastric ulcer without hemorrhage or perforation: Secondary | ICD-10-CM

## 2024-03-07 DIAGNOSIS — K219 Gastro-esophageal reflux disease without esophagitis: Secondary | ICD-10-CM

## 2024-03-07 MED ORDER — PEG 3350-KCL-NA BICARB-NACL 420 G PO SOLR: 4000.0000 mL | Freq: Once | ORAL | 0 refills | Status: AC

## 2024-03-07 NOTE — Progress Notes (Unsigned)
 Primary Care Physician:  Bertell Satterfield, MD Primary Gastroenterologist:  Dr. Shaaron  Pre-Procedure History & Physical: HPI:  Craig Scott is a 68 y.o. male here for for follow-up of Lynch syndrome.  Fairly heavy polyp burden had multiple adenomas 1 piecemeal removed earlier this year.  He is due for a surveillance exam at this time.  He has no bowel symptoms.  Also, he had gastric ulcer on EGD last year biopsies negative for malignancy and H. pylori NSAID use likely he has not had a follow-up.  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  History of heavy heavy alcohol use in the form of beer.  He is cut back considerably he reports down to about 10 to 12 cans of beer weekly.  He denies dysphagia he denies any reflux symptoms.  He is unsure about NSAIDs but his current medication list includes Celebrex.  In the ED yesterday with possible panic attack and tachycardia.  Pulmonary cardiac workup negative.  He is to see the cardiologist later in the week.  No history of CAD.  Of note, as previously recommended, patient has alerted family members regarding diagnosis of Lynch syndrome and he reports family screening has occurred.  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   Tubular adenoma    Urinary frequency    Wears glasses     Past Surgical History:  Procedure Laterality Date   BASAL CELL CARCINOMA EXCISION     BIOPSY  09/04/2021   Procedure: BIOPSY;  Surgeon: Shaaron Lamar HERO, MD;   [facility];  Service: Endoscopy;;   BIOPSY  09/16/2022   Procedure: BIOPSY;  Surgeon: Shaaron Lamar HERO, MD;  [facility];  Service: Endoscopy;;   BUNIONECTOMY WITH HAMMERTOE RECONSTRUCTION AND GASTROC SLIDE Right 06/27/2015   Procedure: RIGHT GASTROC RECESSION/POSTERIOR TIBIAL TENOLYSIS/SECOND AND THIRD METATARSAL WEIL OSTEOTOMY AND HAMMERTOE CORRECTION;  Surgeon: Norleen Armor, MD;  Location: [facility];  Service: Orthopedics;  Laterality: Right;   CALCANEAL OSTEOTOMY Right 06/27/2015   Procedure: CALCANEAL OSTEOTOMY;  Surgeon: Norleen Armor, MD;  Location: [facility];  Service: Orthopedics;  Laterality: Right;   COLONOSCOPY  12/09/2001   RMR: Normal normal rectum/ A few scattered left-sided diverticula.  The remainder of the colonic  mucosa appeared normal   COLONOSCOPY  12/15/2002   RMR: Normal rectum, scattered left-sided diverticula.  The remainder of the colonic mucosa appeared normal   COLONOSCOPY  04/10/2004   RMR: Normal rectum/Sigmoid diverticula/The remainder of the colonic mucosa appeared normal   COLONOSCOPY  12/23/2005   RMR: Normal rectum, scattered sigmoid diverticula Colonic mucosa appeared normal   COLONOSCOPY  01/20/2008   RMR: Distal diminutive rectal polyps, status post cold biopsy removal  otherwise, normal rectum/ biopsy removal; scattered sigmoid diverticula; and benign-appearing/ulcers about the  ileocecal valve, status post biopsy.  Remainder of colonic mucosa and terminal mucosa appeared normal.   COLONOSCOPY  05/22/2010   RMR: distal diminutive rectal polyp s/p bx otherwise normal/few scattered pancolonic diverticula   COLONOSCOPY  09/16/2012   Dr.Chibuike Fleek- normal rectum, few scattered pancolonic diverticulosis, one diminutive polyp in the base of the cecum. 3x3 area of hyper pigmentation i the mid descending segment, this was a flat benign-appearing area o/w the remainder of the colonic mucosa appeared normal. bx= tubular  adenoma and benign colonic mucosa   COLONOSCOPY WITH PROPOFOL  N/A 09/05/2015   Procedure: COLONOSCOPY WITH PROPOFOL ;  Surgeon: Lamar CHRISTELLA Hollingshead, MD;  [facility]: [facility];  Service: Endoscopy;  Laterality: N/A;  0830   COLONOSCOPY WITH PROPOFOL  N/A 05/24/2017   Procedure: COLONOSCOPY WITH PROPOFOL ;  Surgeon: Hollingshead Lamar CHRISTELLA, MD;  [facility];  Service: Endoscopy;  Laterality: N/A;   COLONOSCOPY WITH PROPOFOL  N/A 09/04/2021   Procedure: COLONOSCOPY WITH PROPOFOL ;  Surgeon: Hollingshead Lamar CHRISTELLA, MD;  [facility];  Service: Endoscopy;  Laterality: N/A;  1:15 / ASA III-DO NOT MOVE. Rep is coming for EXALT ERCP scope.   COLONOSCOPY WITH PROPOFOL  N/A 09/16/2022   Procedure: COLONOSCOPY WITH PROPOFOL ;  Surgeon: Hollingshead Lamar CHRISTELLA, MD;  [facility];  Service: Endoscopy;  Laterality: N/A;  7:30 am   COLONOSCOPY WITH PROPOFOL  N/A 09/27/2023   Procedure: COLONOSCOPY WITH PROPOFOL ;  Surgeon: Hollingshead Lamar CHRISTELLA, MD;  [facility];  Service: Endoscopy;  Laterality: N/A;  9:45 am, asa 2   colonscopy  09/04/2021   @ [place]   CYSTOSCOPY WITH BIOPSY N/A 09/12/2021   Procedure: CYSTOSCOPY WITH BLADDERBIOPSY/ FULGURATION/ INSTILLATION OF MARCAINE /PYRIDIUM ;  Surgeon: Nieves Cough, MD;  Location: [facility];  Service: Urology;  Laterality: N/A;   ESOPHAGOGASTRODUODENOSCOPY  12/23/2005   RMR:   Esophagogastric peptic stricture with reflux esophagitis, status post  dilation as described above/ Small hiatal hernia, otherwise normal stomach.  Bulbar erosions otherwise normal D1 and D2   ESOPHAGOGASTRODUODENOSCOPY (EGD) WITH PROPOFOL   09/04/2021   Procedure: ESOPHAGOGASTRODUODENOSCOPY (EGD) WITH PROPOFOL ;  Surgeon: Hollingshead Lamar CHRISTELLA, MD;  [facility];  Service: Endoscopy;;   ESOPHAGOGASTRODUODENOSCOPY (EGD) WITH PROPOFOL   09/16/2022   Procedure: ESOPHAGOGASTRODUODENOSCOPY (EGD) WITH PROPOFOL ;  Surgeon: Hollingshead Lamar CHRISTELLA, MD;  [facility];   Service: Endoscopy;;   EYE SURGERY  as child   FOOT ARTHRODESIS Right 06/17/2017   Procedure: Right Talonavicular and Subtalar Arthrodesis;  Surgeon: Kit Rush, MD;  Location: [facility];  Service: Orthopedics;  Laterality: Right;   HARDWARE REMOVAL Right 06/25/2016   Procedure: REMOVAL OF DEEP IMPLANTS RIGHT MEDIAL CUNEIFORM, RIGHT CALCANEAL REMOVAL OF DEEP IMPLANTS;  Surgeon: Rush Kit, MD;  Location: [facility];  Service: Orthopedics;  Laterality: Right;   HEMOSTASIS CLIP PLACEMENT  09/27/2023   Procedure: HEMOSTASIS CLIP PLACEMENT;  Surgeon: Hollingshead Lamar CHRISTELLA, MD;  [facility];  Service: Endoscopy;;   JOINT REPLACEMENT  09/30/2010   Right knee   KNEE SURGERY Right    bilat    NOSE SURGERY Left    cartiledge removal   POLYPECTOMY  09/05/2015   Procedure: POLYPECTOMY;  Surgeon: Lamar CHRISTELLA Hollingshead, MD;  [facility];  Service: Endoscopy;;   POLYPECTOMY  05/24/2017   Procedure: POLYPECTOMY;  Surgeon: Hollingshead Lamar CHRISTELLA, MD;  [facility];  Service: Endoscopy;;  colon   POLYPECTOMY  09/04/2021   Procedure: POLYPECTOMY;  Surgeon: Hollingshead Lamar CHRISTELLA,  MD;  [facility];  Service: Endoscopy;;   POLYPECTOMY  09/16/2022   Procedure: POLYPECTOMY;  Surgeon: Shaaron Lamar HERO, MD;  [facility];  Service: Endoscopy;;   POLYPECTOMY  09/27/2023   Procedure: POLYPECTOMY;  Surgeon: Shaaron Lamar HERO, MD;  [facility];  Service: Endoscopy;;   RHINOPLASTY     TOTAL HIP ARTHROPLASTY Left 03/20/2015   Procedure: LEFT TOTAL HIP ARTHROPLASTY ANTERIOR APPROACH;  Surgeon: Dempsey Moan, MD;  Location: [facility];  Service: Orthopedics;  Laterality: Left;   TOTAL KNEE ARTHROPLASTY Left 10/23/2013   Procedure: LEFT TOTAL KNEE ARTHROPLASTY;  Surgeon: Dempsey LULLA Moan, MD;  Location: [facility];  Service: Orthopedics;  Laterality: Left;   TYMPANOSTOMY TUBE PLACEMENT Right    early 2014 and multiple times   TYMPANOSTOMY TUBE PLACEMENT       Prior to Admission medications   Medication Sig Start Date End Date Taking? Authorizing Provider  ALPRAZolam  (XANAX ) 1 MG tablet Take 1 mg by mouth 4 (four) times daily as needed for anxiety.   Yes [provider]  aspirin  EC 81 MG tablet Take 1 tablet (81 mg total) by mouth 2 (two) times daily. 06/17/17  Yes Aniceto Eva Grebe, PA-C  celecoxib (CELEBREX) 200 MG capsule Take 200 mg by mouth daily.   Yes [provider]  cetirizine (ZYRTEC) 10 MG tablet Take 10 mg by mouth daily.   Yes [provider]  Cholecalciferol (VITAMIN D3) 125 MCG (5000 UT) CAPS Take 5,000 Units by mouth daily.   Yes [provider]  cyclobenzaprine (FLEXERIL) 10 MG tablet Take 10 mg by mouth 3 (three) times daily. 02/21/24  Yes [provider]  ENBREL SURECLICK 50 MG/ML injection Inject 50 mg into the skin once a week. 09/09/23  Yes [provider]  Potassium 99 MG TABS Take 99 mg by mouth daily.   Yes [provider]  pregabalin (LYRICA) 25 MG capsule Take 25 mg by mouth daily.   Yes [provider]  simvastatin  (ZOCOR ) 20 MG tablet Take 20 mg by mouth daily.   Yes [provider]  solifenacin (VESICARE) 5 MG tablet Take 5 mg by mouth daily. 08/31/23  Yes [provider]  tamsulosin (FLOMAX) 0.4 MG CAPS capsule Take 0.4 mg by mouth daily. 09/02/23  Yes [provider]    Allergies as of 03/07/2024 - Review Complete 03/07/2024  Allergen Reaction Noted   Doxycycline   06/30/2022   Lyrica [pregabalin] Other (See Comments) 03/14/2015    Family History  Problem Relation Age of Onset   Colon cancer Brother        11    Colon cancer Brother        38   Colon cancer Mother        age 42    Dementia Mother    Arthritis Mother    Heart attack Father    Colon cancer Cousin        75 years old onset   Colon cancer Cousin        49 years onset    Social History   Socioeconomic History   Marital status: Married     Spouse name: Not on file   Number of children: 2   Years of education: 12   Highest education level: Not on file  Occupational History   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: [employer]    Comment: [place]  Tobacco Use   Smoking status: Former  Current packs/day: 0.00    Average packs/day: 0.5 packs/day for 30.0 years (15.0 ttl pk-yrs)    Types: Cigars, Cigarettes    Start date: 09/15/1975    Quit date: 09/14/2005    Years since quitting: 18.4   Smokeless tobacco: Never  Vaping Use   Vaping status: Never Used  Substance and Sexual Activity   Alcohol use: Yes    Comment: 4-5 beer per day or none for a couple of days   Drug use: Yes    Types: Marijuana    Comment: no marijuana use in 25 yrs per pt on 09-09-2021/smokes occasionally   Sexual activity: Yes    Birth control/protection: None  Other Topics Concern   Not on file  Social History Narrative   ** Merged History Encounter **       Social Drivers of Corporate investment banker Strain: Not on file  Food Insecurity: Not on file  Transportation Needs: Not on file  Physical Activity: Not on file  Stress: Not on file  Social Connections: Not on file  Intimate Partner Violence: Not on file    Review of Systems: See HPI, otherwise negative ROS  Physical Exam: BP 112/71 (BP Location: Right Arm, Patient Position: Sitting, Cuff Size: Normal)   Pulse 77   Temp 98.2 F (36.8 C) (Oral)   Ht 5' 11 (1.803 m)   Wt 215 lb (97.5 kg)   SpO2 97%   BMI 29.99 kg/m  General:   Alert,  Well-developed, well-nourished, pleasant and cooperative in NAD Neck:  Supple; no masses or thyromegaly. No significant cervical adenopathy. Lungs:  Clear throughout to auscultation.   No wheezes, crackles, or rhonchi. No acute distress. Heart:  Regular rate and rhythm; no murmurs, clicks, rubs,  or gallops. Abdomen: Non-distended, normal bowel sounds.  Soft and nontender without appreciable mass or hepatosplenomegaly.   Impression/Plan:  68 year old gentleman with Lynch syndrome history of multiple colonic polyps removed over time now in need of a surveillance examination given polyp was found earlier this year and the need for piecemeal removal.  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  I suspect he has been taking NSAIDs.  By my estimation, short-term memory deficit and confusion seem to be insidiously progressive.  Recent chest pain and tachycardia for which he saw ED doc yesterday.  Cardiology evaluation in the works.  Recommendations:  Colonoscopy at this time.  History of polyps.  History of Lynch syndrome.  ASA 3 in room 3  Also, we will perform an upper endoscopy to follow-up on the stomach ulcer found last year.  ASA 3 in room 3.  The risks, benefits, limitations, imponderables and alternatives regarding both EGD and colonoscopy have been reviewed with the patient. Questions have been answered. All parties agreeable.    Would be best for you to avoid all forms of NSAIDs like Advil Aleve Goody powders and Celebrex or Celexa Cobb  Follow-up with the cardiologist as recommended later this week.  Will plan to perform both EGD and colonoscopy sometime in the month of July.       Notice: This dictation was prepared with Dragon dictation along with smaller phrase technology. Any transcriptional errors that result from this process are unintentional and may not be corrected upon review.

## 2024-03-07 NOTE — Telephone Encounter (Signed)
 [facility] PA:  Approved Authorization #[id number]  Tracking #[id number] Dates of service 04/05/2024 - 07/05/2024

## 2024-03-07 NOTE — Patient Instructions (Signed)
 It was good to see you again today!  As discussed, you need to have a follow-up colonoscopy at this time.  History of polyps.  History of Lynch syndrome.  ASA 3 in room 3  Also, we will perform an upper endoscopy to follow-up on the stomach ulcer found last year.  ASA 3 in room 3  Would be best for you to avoid all forms of NSAIDs like Advil Aleve Goody powders and Celebrex or Celexa Cobb  Follow-up with the cardiologist as recommended later this week.  Will plan to perform both EGD and colonoscopy sometime in the month of July.

## 2024-03-08 ENCOUNTER — Encounter: Payer: Self-pay | Admitting: *Deleted

## 2024-03-09 ENCOUNTER — Ambulatory Visit: Attending: Internal Medicine | Admitting: Internal Medicine

## 2024-03-09 VITALS — BP 112/69 | HR 73 | Ht 71.0 in | Wt 211.0 lb

## 2024-03-09 DIAGNOSIS — M069 Rheumatoid arthritis, unspecified: Secondary | ICD-10-CM | POA: Diagnosis not present

## 2024-03-09 DIAGNOSIS — R072 Precordial pain: Secondary | ICD-10-CM | POA: Diagnosis not present

## 2024-03-09 DIAGNOSIS — F1290 Cannabis use, unspecified, uncomplicated: Secondary | ICD-10-CM

## 2024-03-09 DIAGNOSIS — I700 Atherosclerosis of aorta: Secondary | ICD-10-CM | POA: Diagnosis not present

## 2024-03-09 MED ORDER — IVABRADINE HCL 7.5 MG PO TABS: 15.0000 mg | ORAL_TABLET | Freq: Once | ORAL | 0 refills | Status: AC

## 2024-03-09 MED ORDER — NITROGLYCERIN 0.4 MG SL SUBL: 0.4000 mg | SUBLINGUAL_TABLET | SUBLINGUAL | 3 refills | Status: DC | PRN
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
## 2024-03-09 NOTE — Patient Instructions (Signed)
 Medication Instructions:  Your physician has recommended you make the following change in your medication:  START: Nitroglycerin 0.4 mg under your tongue every 5 minutes up to 15 minutes as needed for Chest Pain  *If you need a refill on your cardiac medications before your next appointment, please call your pharmacy*  Lab Work: NONE  If you have labs (blood work) drawn today and your tests are completely normal, you will receive your results only by: MyChart Message (if you have MyChart) OR A paper copy in the mail If you have any lab test that is abnormal or we need to change your treatment, we will call you to review the results.  Testing/Procedures: Your physician has requested that you have cardiac CT. Cardiac computed tomography (CT) is a painless test that uses an x-ray machine to take clear, detailed pictures of your heart. For further information please visit [email or web address] Please follow instruction sheet as given.    Follow-Up: At [facility], you and your health needs are our priority.  As part of our continuing mission to provide you with exceptional heart care, our providers are all part of one team.  This team includes your primary Cardiologist (physician) and Advanced Practice Providers or APPs (Physician Assistants and Nurse Practitioners) who all work together to provide you with the care you need, when you need it.  Your next appointment:   3 month(s)  Provider:   One of our Advanced Practice Providers (APPs): Morse Clause, PA-C  Lamarr Satterfield, NP Miriam Shams, NP  Olivia Pavy, PA-C Josefa Beauvais, NP  Leontine Salen, PA-C Orren Fabry, PA-C  [place], [place] Jackee Alberts, NP  Damien Braver, NP Jon Hails, PA-C  Waddell Donath, PA-C    Dayna Dunn, PA-C  Glendia Ferrier, PA-C Lum Louis, NP Katlyn West, NP Aline Door, PA-C  Evan Williams, PA-C Sheng Haley, PA-C  Xika Zhao, NP Rollo Louder, PA-C     Other Instructions   Your  cardiac CT will be scheduled at one of the below locations:   [facility] [address] [place], [place] [address] [phone]  OR  [facility] [address] [place], [place] [address] [phone]  OR   [facility] [address] [place], [place] [address] [phone]  OR   [facility] [address] [place], [place] [address] [phone]  OR   [facility] [address]  [place], [place] [address]  If scheduled at [facility], please arrive at the [facility] and Children's Entrance (Entrance C2) of [facility] 30 minutes prior to test start time. You can use the FREE valet parking offered at entrance C (encouraged to control the heart rate for the test)  Proceed to the [facility] Radiology Department (first floor) to check-in and test prep.   All radiology patients and guests should use entrance C2 at [facility], accessed from [facility], even though the hospital's physical address listed is [address].    If scheduled at the Heart and Vascular Tower at [address], please enter the parking lot using the [address] entrance and use the FREE valet service at the patient drop-off area. Enter the buidling and check-in with registration on the main floor.  If scheduled at [facility] or [facility], please arrive 15 mins early for check-in and test prep.  There is spacious  parking and easy access to the radiology department from the [facility] Heart and Vascular entrance. Please enter here and check-in with the desk attendant.   If scheduled at [facility], please arrive 30 minutes early for check-in and test prep.  Please follow these instructions carefully (unless otherwise directed):  An IV will be required for this test and  Nitroglycerin will be given.  Hold all erectile dysfunction medications at least 3 days (72 hrs) prior to test. (Ie viagra, cialis , sildenafil, tadalafil , etc)   On the Night Before the Test: Be sure to Drink plenty of water . Do not consume any caffeinated/decaffeinated beverages or chocolate 12 hours prior to your test. Do not take any antihistamines 12 hours prior to your test.   On the Day of the Test: Drink plenty of water  until 1 hour prior to the test. Do not eat any food 1 hour prior to test. You may take your regular medications prior to the test.  Take ivabradine (Corlanor) 15 mg two hours prior to test. If you take Furosemide/Hydrochlorothiazide/Spironolactone/Chlorthalidone, please HOLD on the morning of the test. Patients who wear a continuous glucose monitor MUST remove the device prior to scanning.        After the Test: Drink plenty of water . After receiving IV contrast, you may experience a mild flushed feeling. This is normal. On occasion, you may experience a mild rash up to 24 hours after the test. This is not dangerous. If this occurs, you can take Benadryl  25 mg, Zyrtec, Claritin , or Allegra and increase your fluid intake. (Patients taking Tikosyn should avoid Benadryl , and may take Zyrtec, Claritin , or Allegra) If you experience trouble breathing, this can be serious. If it is severe call 911 IMMEDIATELY. If it is mild, please call our office.  We will call to schedule your test 2-4 weeks out understanding that some insurance companies will need an authorization prior to the service being performed.   For more information and frequently asked questions, please visit our website : [email or web address]  For non-scheduling related questions, please contact the cardiac imaging nurse navigator should you have any questions/concerns: Cardiac Imaging Nurse Navigators Direct Office Dial: [phone]   For scheduling needs, including cancellations and  rescheduling, please call [place], [phone].

## 2024-03-10 ENCOUNTER — Other Ambulatory Visit: Payer: Self-pay | Admitting: Internal Medicine

## 2024-03-10 DIAGNOSIS — Z6829 Body mass index (BMI) 29.0-29.9, adult: Secondary | ICD-10-CM | POA: Diagnosis not present

## 2024-03-10 DIAGNOSIS — E6609 Other obesity due to excess calories: Secondary | ICD-10-CM | POA: Diagnosis not present

## 2024-03-10 DIAGNOSIS — R0789 Other chest pain: Secondary | ICD-10-CM | POA: Diagnosis not present

## 2024-03-10 NOTE — Telephone Encounter (Signed)
 Pt's pharmacy is requesting a refill on medication ivabradine  7.5 mg tablets. This medication has expired and no longer on pt's medication list. Please address

## 2024-03-10 NOTE — Telephone Encounter (Signed)
 Called [facility] where script was sent.  Was told they do not carry ivabradine  so script was transferred to [facility] in [place] yesterday.  Called [facility] was told pt has already picked up medication.  Our office received request d/t med being placed on auto refill.  Was advised medication would be taken off as it was a one time medication.

## 2024-03-16 ENCOUNTER — Telehealth (HOSPITAL_COMMUNITY): Payer: Self-pay | Admitting: *Deleted

## 2024-03-16 ENCOUNTER — Encounter (HOSPITAL_COMMUNITY): Payer: Self-pay

## 2024-03-16 NOTE — Telephone Encounter (Signed)
Reaching out to patient to offer assistance regarding upcoming cardiac imaging study; pt verbalizes understanding of appt date/time, parking situation and where to check in, pre-test NPO status and medications ordered, and verified current allergies; name and call back number provided for further questions should they arise  Craig Clement RN Navigator Cardiac Imaging [facility] [phone] office [phone] cell  Patient to take 71m ivabradine two hours prior to his cardiac CT scan.

## 2024-03-20 ENCOUNTER — Ambulatory Visit (HOSPITAL_COMMUNITY)
Admission: RE | Admit: 2024-03-20 | Discharge: 2024-03-20 | Disposition: A | Discharge status: Home / Self Care | Source: Ambulatory Visit | Attending: Internal Medicine | Admitting: Internal Medicine

## 2024-03-20 DIAGNOSIS — I25118 Atherosclerotic heart disease of native coronary artery with other forms of angina pectoris: Secondary | ICD-10-CM | POA: Diagnosis not present

## 2024-03-20 DIAGNOSIS — R072 Precordial pain: Secondary | ICD-10-CM

## 2024-03-20 DIAGNOSIS — I3481 Nonrheumatic mitral (valve) annulus calcification: Secondary | ICD-10-CM | POA: Insufficient documentation

## 2024-03-20 MED ORDER — NITROGLYCERIN 0.4 MG SL SUBL
0.8000 mg | SUBLINGUAL_TABLET | Freq: Once | SUBLINGUAL | Status: AC
  Administered 2024-03-20: 0.8 mg via SUBLINGUAL

## 2024-03-20 MED ORDER — IOHEXOL 350 MG/ML SOLN
100.0000 mL | Freq: Once | INTRAVENOUS | Status: AC | PRN
  Administered 2024-03-20: 100 mL via INTRAVENOUS

## 2024-03-23 ENCOUNTER — Ambulatory Visit: Payer: Self-pay

## 2024-03-27 ENCOUNTER — Telehealth: Payer: Self-pay | Admitting: Internal Medicine

## 2024-03-27 NOTE — Telephone Encounter (Signed)
 Patient c/o Palpitations:  STAT if patient reporting lightheadedness, shortness of breath, or chest pain  How long have you had palpitations/irregular HR/ Afib? Are you having the symptoms now?   No  Are you currently experiencing lightheadedness, SOB or CP?   No  Do you have a history of afib (atrial fibrillation) or irregular heart rhythm?   Yes  Have you checked your BP or HR? (document readings if available):  Patient stated he took readings this morning and everything looked normal  Are you experiencing any other symptoms?   No   Patient called to follow-up on CT CORONARY MORPH W/CTA COR W/SCORE W/CA W/CM &/OR WO/CM and noted he had irregular heart beats this morning and took a Nitroglycerin  tablet today.

## 2024-03-27 NOTE — Telephone Encounter (Signed)
 Returned patient's call to discuss concerns.   Patient reports he had an episode of chest discomfort this morning that felt similar to the chest discomfort he had in June that sent him to the ED.  He states he got up around 8 AM and was letting his dogs out/eating breakfast. He had taken  his morning meds, including a dose of xanax  that he takes for anxiety. He states he felt chest pressure/pain and got scared. His wife checked his BP and HR , but they were normal. He states he also took one dose of nitroglycerin  and layed down on the couch. He states it took about 30-40 mins for him to feel better. He states he took another dose of xanax  just prior to my call this afternoon and he does not have any chest discomfort. Patient states he has been very stressed out for the past 6-7 months as he has been caring for his spouse.   Reviewed coronary CTA results, patient verbalizes understanding that he had no blockages in his coronary arteries but may have some evidence of pulmonary hypertension. Patient states that he does not smoke tobacco but he does smoke a very small amount of marijauna 1-2 times a week.   Advised patient that I would forward his concerns to Dr. Santo and that someone would call him with recommendations. In the meantime, advised patient to go to ED if chest pain/sob occur and do not get better with nitroglycerin  or rest. Patient verbalized understanding.

## 2024-03-29 NOTE — Telephone Encounter (Signed)
 Attempted to call patient to discuss results, unable to leave voice mail as phone keeps disconnecting. Coronary work-up forwarded to PCP.

## 2024-04-03 ENCOUNTER — Encounter (HOSPITAL_COMMUNITY): Payer: Self-pay

## 2024-04-03 ENCOUNTER — Encounter (HOSPITAL_COMMUNITY)
Admission: RE | Admit: 2024-04-03 | Discharge: 2024-04-03 | Disposition: A | Discharge status: Home / Self Care | Source: Ambulatory Visit | Attending: Internal Medicine | Admitting: Internal Medicine

## 2024-04-03 HISTORY — DX: Atherosclerotic heart disease of native coronary artery without angina pectoris: I25.10

## 2024-04-03 HISTORY — DX: Angina pectoris, unspecified: I20.9

## 2024-04-03 HISTORY — DX: Depression, unspecified: F32.A

## 2024-04-03 NOTE — Patient Instructions (Signed)
 Craig Scott  04/03/2024     @PREFPERIOPPHARMACY @   Your procedure is scheduled on 04/05/2024.   Report to [facility] at 6:45 A.M.   Call this number if you have problems the morning of surgery:   [phone]  If you experience any cold or flu symptoms such as cough, fever, chills, shortness of breath, etc. between now and your scheduled surgery, please notify us  at the above number.   Remember:   Please follow the diet and prep instructions given to you by Dr Ivonne office.   You may drink clear liquids until 4:15AM .  Clear liquids allowed are:                    Water , Juice (No red color; non-citric and without pulp; diabetics please choose diet or no sugar options), Carbonated beverages (diabetics please choose diet or no sugar options), Clear Tea (No creamer, milk, or cream, including half & half and powdered creamer), Black Coffee Only (No creamer, milk or cream, including half & half and powdered creamer), Plain Jell-O Only (No red color; diabetics please choose no sugar options), Clear Sports drink (No red color; diabetics please choose diet or no sugar options), and Plain Popsicles Only (No red color; diabetics please choose no sugar options)    Take these medicines the morning of surgery with A SIP OF WATER  : Alprazolam  Cyclobenzaprine Pregabalin and Tamsulosin    Do not wear jewelry, make-up or nail polish, including gel polish,  artificial nails, or any other type of covering on natural nails (fingers and  toes).  Do not wear lotions, powders, or perfumes, or deodorant.  Do not shave 48 hours prior to surgery.  Men may shave face and neck.  Do not bring valuables to the hospital.  [facility] is not responsible for any belongings or valuables.  Contacts, dentures or bridgework may not be worn into surgery.  Leave your suitcase in the car.  After surgery it may be brought to your room.  For patients admitted to the hospital, discharge time will be determined by  your treatment team.  Patients discharged the day of surgery will not be allowed to drive home.   Name and phone number of your driver:   Family  Special instructions:  N/A  Please read over the following fact sheets that you were given.  Care and Recovery After Surgery   Upper Endoscopy, Adult Upper endoscopy is a procedure to look inside the upper GI (gastrointestinal) tract. The upper GI tract is made up of: The esophagus. This is the part of the body that moves food from your mouth to your stomach. The stomach. The duodenum. This is the first part of your small intestine. This procedure is also called esophagogastroduodenoscopy (EGD) or gastroscopy. In this procedure, your health care provider passes a thin, flexible tube (endoscope) through your mouth and down your esophagus into your stomach and into your duodenum. A small camera is attached to the end of the tube. Images from the camera appear on a monitor in the exam room. During this procedure, your health care provider may also remove a small piece of tissue to be sent to a lab and examined under a microscope (biopsy). Your health care provider may do an upper endoscopy to diagnose cancers of the upper GI tract. You may also have this procedure to find the cause of other conditions, such as: Stomach pain. Heartburn. Pain or problems when swallowing. Nausea and vomiting.  Stomach bleeding. Stomach ulcers. Tell a health care provider about: Any allergies you have. All medicines you are taking, including vitamins, herbs, eye drops, creams, and over-the-counter medicines. Any problems you or family members have had with anesthetic medicines. Any bleeding problems you have. Any surgeries you have had. Any medical conditions you have. Whether you are pregnant or may be pregnant. What are the risks? Your healthcare provider will talk with you about risks. These may include: Infection. Bleeding. Allergic reactions to  medicines. A tear or hole (perforation) in the esophagus, stomach, or duodenum. What happens before the procedure? When to stop eating and drinking Follow instructions from your health care provider about what you may eat and drink. These may include: 8 hours before your procedure Stop eating most foods. Do not eat meat, fried foods, or fatty foods. Eat only light foods, such as toast or crackers. All liquids are okay except energy drinks and alcohol. 6 hours before your procedure Stop eating. Drink only clear liquids, such as water , clear fruit juice, black coffee, plain tea, and sports drinks. Do not drink energy drinks or alcohol. 2 hours before your procedure Stop drinking all liquids. You may be allowed to take medicines with small sips of water . If you do not follow your health care provider's instructions, your procedure may be delayed or canceled. Medicines Ask your health care provider about: Changing or stopping your regular medicines. This is especially important if you are taking diabetes medicines or blood thinners. Taking medicines such as aspirin  and ibuprofen. These medicines can thin your blood. Do not take these medicines unless your health care provider tells you to take them. Taking over-the-counter medicines, vitamins, herbs, and supplements. General instructions If you will be going home right after the procedure, plan to have a responsible adult: Take you home from the hospital or clinic. You will not be allowed to drive. Care for you for the time you are told. What happens during the procedure?  An IV will be inserted into one of your veins. You may be given one or more of the following: A medicine to help you relax (sedative). A medicine to numb the throat (local anesthetic). You will lie on your left side on an exam table. Your health care provider will pass the endoscope through your mouth and down your esophagus. Your health care provider will use the  scope to check the inside of your esophagus, stomach, and duodenum. Biopsies may be taken. The endoscope will be removed. The procedure may vary among health care providers and hospitals. What happens after the procedure? Your blood pressure, heart rate, breathing rate, and blood oxygen level will be monitored until you leave the hospital or clinic. When your throat is no longer numb, you may be given some fluids to drink. If you were given a sedative during the procedure, it can affect you for several hours. Do not drive or operate machinery until your health care provider says that it is safe. It is up to you to get the results of your procedure. Ask your health care provider, or the department that is doing the procedure, when your results will be ready. Contact a health care provider if you: Have a sore throat that lasts longer than 1 day. Have a fever. Get help right away if you: Vomit blood or your vomit looks like coffee grounds. Have bloody, black, or tarry stools. Have a very bad sore throat or you cannot swallow. Have difficulty breathing or very bad pain in  your chest or abdomen. These symptoms may be an emergency. Get help right away. Call 911. Do not wait to see if the symptoms will go away. Do not drive yourself to the hospital. Summary Upper endoscopy is a procedure to look inside the upper GI tract. During the procedure, an IV will be inserted into one of your veins. You may be given a medicine to help you relax. The endoscope will be passed through your mouth and down your esophagus. Follow instructions from your health care provider about what you can eat and drink. This information is not intended to replace advice given to you by your health care provider. Make sure you discuss any questions you have with your health care provider. Document Revised: 12/10/2021 Document Reviewed: 12/10/2021 Elsevier Patient Education  2024 [employer]    Colonoscopy, Adult A  colonoscopy is a procedure to look at the entire large intestine. This procedure is done using a long, thin, flexible tube that has a camera on the end. You may have a colonoscopy: As a part of normal colorectal screening. If you have certain symptoms, such as: A low number of red blood cells in your blood (anemia). Diarrhea that does not go away. Pain in your abdomen. Blood in your stool. A colonoscopy can help screen for and diagnose medical problems, including: An abnormal growth of cells or tissue (tumor). Abnormal growths within the lining of your intestine (polyps). Inflammation. Areas of bleeding. Tell your health care provider about: Any allergies you have. All medicines you are taking, including vitamins, herbs, eye drops, creams, and over-the-counter medicines. Any problems you or family members have had with anesthetic medicines. Any bleeding problems you have. Any surgeries you have had. Any medical conditions you have. Any problems you have had with having bowel movements. Whether you are pregnant or may be pregnant. What are the risks? Generally, this is a safe procedure. However, problems may occur, including: Bleeding. Damage to your intestine. Allergic reactions to medicines given during the procedure. Infection. This is rare. What happens before the procedure? Eating and drinking restrictions Follow instructions from your health care provider about eating or drinking restrictions, which may include: A few days before the procedure: Follow a low-fiber diet. Avoid nuts, seeds, dried fruit, raw fruits, and vegetables. 1-3 days before the procedure: Eat only gelatin dessert or ice pops. Drink only clear liquids, such as water , clear juice, clear broth or bouillon, black coffee or tea, or clear soft drinks or sports drinks. Avoid liquids that contain red or purple dye. The day of the procedure: Do not eat solid foods. You may continue to drink clear liquids until  up to 2 hours before the procedure. Do not eat or drink anything starting 2 hours before the procedure, or within the time period that your health care provider recommends. Bowel prep If you were prescribed a bowel prep to take by mouth (orally) to clean out your colon: Take it as told by your health care provider. Starting the day before your procedure, you will need to drink a large amount of liquid medicine. The liquid will cause you to have many bowel movements of loose stool until your stool becomes almost clear or light green. If your skin or the opening between the buttocks (anus) gets irritated from diarrhea, you may relieve the irritation using: Wipes with medicine in them, such as adult wet wipes with aloe and vitamin E. A product to soothe skin, such as petroleum jelly. If you vomit while drinking  the bowel prep: Take a break for up to 60 minutes. Begin the bowel prep again. Call your health care provider if you keep vomiting or you cannot take the bowel prep without vomiting. To clean out your colon, you may also be given: Laxative medicines. These help you have a bowel movement. Instructions for enema use. An enema is liquid medicine injected into your rectum. Medicines Ask your health care provider about: Changing or stopping your regular medicines or supplements. This is especially important if you are taking iron supplements, diabetes medicines, or blood thinners. Taking medicines such as aspirin  and ibuprofen. These medicines can thin your blood. Do not take these medicines unless your health care provider tells you to take them. Taking over-the-counter medicines, vitamins, herbs, and supplements. General instructions Ask your health care provider what steps will be taken to help prevent infection. These may include washing skin with a germ-killing soap. If you will be going home right after the procedure, plan to have a responsible adult: Take you home from the hospital or  clinic. You will not be allowed to drive. Care for you for the time you are told. What happens during the procedure?  An IV will be inserted into one of your veins. You will be given a medicine to make you fall asleep (general anesthetic). You will lie on your side with your knees bent. A lubricant will be put on the tube. Then the tube will be: Inserted into your anus. Gently eased through all parts of your large intestine. Air will be sent into your colon to keep it open. This may cause some pressure or cramping. Images will be taken with the camera and will appear on a screen. A small tissue sample may be removed to be looked at under a microscope (biopsy). The tissue may be sent to a lab for testing if any signs of problems are found. If small polyps are found, they may be removed and checked for cancer cells. When the procedure is finished, the tube will be removed. The procedure may vary among health care providers and hospitals. What happens after the procedure? Your blood pressure, heart rate, breathing rate, and blood oxygen level will be monitored until you leave the hospital or clinic. You may have a small amount of blood in your stool. You may pass gas and have mild cramping or bloating in your abdomen. This is caused by the air that was used to open your colon during the exam. If you were given a sedative during the procedure, it can affect you for several hours. Do not drive or operate machinery until your health care provider says that it is safe. It is up to you to get the results of your procedure. Ask your health care provider, or the department that is doing the procedure, when your results will be ready. Summary A colonoscopy is a procedure to look at the entire large intestine. Follow instructions from your health care provider about eating and drinking before the procedure. If you were prescribed an oral bowel prep to clean out your colon, take it as told by your health  care provider. During the colonoscopy, a flexible tube with a camera on its end is inserted into the anus and then passed into all parts of the large intestine. This information is not intended to replace advice given to you by your health care provider. Make sure you discuss any questions you have with your health care provider. Document Revised: 10/13/2022 Document Reviewed: 04/23/2021  Elsevier Patient Education  2024 [employer]  Monitored Anesthesia Care Anesthesia refers to the techniques, procedures, and medicines that help a person stay safe and comfortable during surgery. Monitored anesthesia care, or sedation, is one type of anesthesia. You may have sedation if you do not need to be asleep for your procedure. Procedures that use sedation may include: Surgery to remove cataracts from your eyes. A dental procedure. A biopsy. This is when a tissue sample is removed and looked at under a microscope. You will be watched closely during your procedure. Your level of sedation or type of anesthesia may be changed to fit your needs. Tell a health care provider about: Any allergies you have. All medicines you are taking, including vitamins, herbs, eye drops, creams, and over-the-counter medicines. Any problems you or family members have had with anesthesia. Any bleeding problems you have. Any surgeries you have had. Any medical conditions or illnesses you have. This includes sleep apnea, cough, fever, or the flu. Whether you are pregnant or may be pregnant. Whether you use cigarettes, alcohol, or drugs. Any use of steroids, whether by mouth or as a cream. What are the risks? Your health care provider will talk with you about risks. These may include: Getting too much medicine (oversedation). Nausea. Allergic reactions to medicines. Trouble breathing. If this happens, a breathing tube may be used to help you breathe. It will be removed when you are awake and breathing on your own. Heart  trouble. Lung trouble. Confusion that gets better with time (emergence delirium). What happens before the procedure? When to stop eating and drinking Follow instructions from your health care provider about what you may eat and drink. These may include: 8 hours before your procedure Stop eating most foods. Do not eat meat, fried foods, or fatty foods. Eat only light foods, such as toast or crackers. All liquids are okay except energy drinks and alcohol. 6 hours before your procedure Stop eating. Drink only clear liquids, such as water , clear fruit juice, black coffee, plain tea, and sports drinks. Do not drink energy drinks or alcohol. 2 hours before your procedure Stop drinking all liquids. You may be allowed to take medicines with small sips of water . If you do not follow your health care provider's instructions, your procedure may be delayed or canceled. Medicines Ask your health care provider about: Changing or stopping your regular medicines. These include any diabetes medicines or blood thinners you take. Taking medicines such as aspirin  and ibuprofen. These medicines can thin your blood. Do not take them unless your health care provider tells you to. Taking over-the-counter medicines, vitamins, herbs, and supplements. Testing You may have an exam or testing. You may have a blood or urine sample taken. General instructions Do not use any products that contain nicotine or tobacco for at least 4 weeks before the procedure. These products include cigarettes, chewing tobacco, and vaping devices, such as e-cigarettes. If you need help quitting, ask your health care provider. If you will be going home right after the procedure, plan to have a responsible adult: Take you home from the hospital or clinic. You will not be allowed to drive. Care for you for the time you are told. What happens during the procedure?  Your blood pressure, heart rate, breathing, level of pain, and blood  oxygen level will be monitored. An IV will be inserted into one of your veins. You may be given: A sedative. This helps you relax. Anesthesia. This will: Numb certain areas of  your body. Make you fall asleep for surgery. You will be given medicines as needed to keep you comfortable. The more medicine you are given, the deeper your level of sedation will be. Your level of sedation may be changed to fit your needs. There are three levels of sedation: Mild sedation. At this level, you may feel awake and relaxed. You will be able to follow directions. Moderate sedation. At this level, you will be sleepy. You may not remember the procedure. Deep sedation. At this level, you will be asleep. You will not remember the procedure. How you get the medicines will depend on your age and the procedure. They may be given as: A pill. This may be taken by mouth (orally) or inserted into the rectum. An injection. This may be into a vein or muscle. A spray through the nose. After your procedure is over, the medicine will be stopped. The procedure may vary among health care providers and hospitals. What happens after the procedure? Your blood pressure, heart rate, breathing rate, and blood oxygen level will be monitored until you leave the hospital or clinic. You may feel sleepy, clumsy, or nauseous. You may not remember what happened during or after the procedure. Sedation can affect you for several hours. Do not drive or use machinery until your health care provider says that it is safe. This information is not intended to replace advice given to you by your health care provider. Make sure you discuss any questions you have with your health care provider. Document Revised: 01/26/2022 Document Reviewed: 01/26/2022 Elsevier Patient Education  2024 [employer].

## 2024-04-03 NOTE — Pre-Procedure Instructions (Signed)
 Attempted pre-op phone call. Left VM for him to call us back.

## 2024-04-05 ENCOUNTER — Encounter (HOSPITAL_COMMUNITY)
Admission: RE | Disposition: A | Discharge status: Home / Self Care | Payer: Self-pay | Source: Home / Self Care | Attending: Internal Medicine

## 2024-04-05 ENCOUNTER — Encounter (HOSPITAL_COMMUNITY): Payer: Self-pay | Admitting: Internal Medicine

## 2024-04-05 ENCOUNTER — Ambulatory Visit (HOSPITAL_COMMUNITY)
Admission: RE | Admit: 2024-04-05 | Discharge: 2024-04-05 | Disposition: A | Discharge status: Home / Self Care | Attending: Internal Medicine | Admitting: Internal Medicine

## 2024-04-05 ENCOUNTER — Ambulatory Visit (HOSPITAL_COMMUNITY): Payer: Self-pay | Admitting: Anesthesiology

## 2024-04-05 ENCOUNTER — Other Ambulatory Visit: Payer: Self-pay

## 2024-04-05 ENCOUNTER — Encounter (HOSPITAL_COMMUNITY): Payer: Self-pay | Admitting: Anesthesiology

## 2024-04-05 DIAGNOSIS — Z1509 Genetic susceptibility to other malignant neoplasm: Secondary | ICD-10-CM | POA: Diagnosis not present

## 2024-04-05 DIAGNOSIS — K208 Other esophagitis without bleeding: Secondary | ICD-10-CM | POA: Insufficient documentation

## 2024-04-05 DIAGNOSIS — Z87891 Personal history of nicotine dependence: Secondary | ICD-10-CM | POA: Diagnosis not present

## 2024-04-05 DIAGNOSIS — K279 Peptic ulcer, site unspecified, unspecified as acute or chronic, without hemorrhage or perforation: Secondary | ICD-10-CM | POA: Diagnosis not present

## 2024-04-05 DIAGNOSIS — K5730 Diverticulosis of large intestine without perforation or abscess without bleeding: Secondary | ICD-10-CM

## 2024-04-05 DIAGNOSIS — Z1211 Encounter for screening for malignant neoplasm of colon: Secondary | ICD-10-CM | POA: Diagnosis not present

## 2024-04-05 DIAGNOSIS — K635 Polyp of colon: Secondary | ICD-10-CM

## 2024-04-05 DIAGNOSIS — K2270 Barrett's esophagus without dysplasia: Secondary | ICD-10-CM | POA: Diagnosis not present

## 2024-04-05 DIAGNOSIS — K2980 Duodenitis without bleeding: Secondary | ICD-10-CM

## 2024-04-05 DIAGNOSIS — Z09 Encounter for follow-up examination after completed treatment for conditions other than malignant neoplasm: Secondary | ICD-10-CM | POA: Diagnosis present

## 2024-04-05 DIAGNOSIS — I25119 Atherosclerotic heart disease of native coronary artery with unspecified angina pectoris: Secondary | ICD-10-CM

## 2024-04-05 DIAGNOSIS — I2510 Atherosclerotic heart disease of native coronary artery without angina pectoris: Secondary | ICD-10-CM | POA: Diagnosis not present

## 2024-04-05 DIAGNOSIS — K209 Esophagitis, unspecified without bleeding: Secondary | ICD-10-CM

## 2024-04-05 DIAGNOSIS — K6389 Other specified diseases of intestine: Secondary | ICD-10-CM

## 2024-04-05 DIAGNOSIS — Z860100 Personal history of colon polyps, unspecified: Secondary | ICD-10-CM | POA: Diagnosis not present

## 2024-04-05 DIAGNOSIS — D127 Benign neoplasm of rectosigmoid junction: Secondary | ICD-10-CM | POA: Insufficient documentation

## 2024-04-05 DIAGNOSIS — I10 Essential (primary) hypertension: Secondary | ICD-10-CM | POA: Diagnosis not present

## 2024-04-05 DIAGNOSIS — K220 Achalasia of cardia: Secondary | ICD-10-CM | POA: Diagnosis not present

## 2024-04-05 HISTORY — PX: COLONOSCOPY: SHX5424

## 2024-04-05 HISTORY — PX: ESOPHAGOGASTRODUODENOSCOPY: SHX5428

## 2024-04-05 SURGERY — COLONOSCOPY
Anesthesia: General

## 2024-04-05 MED ORDER — PROPOFOL 500 MG/50ML IV EMUL
INTRAVENOUS | Status: DC | PRN
  Administered 2024-04-05: 200 ug/kg/min via INTRAVENOUS

## 2024-04-05 MED ORDER — PHENYLEPHRINE 80 MCG/ML (10ML) SYRINGE FOR IV PUSH (FOR BLOOD PRESSURE SUPPORT)
PREFILLED_SYRINGE | INTRAVENOUS | Status: DC | PRN
  Administered 2024-04-05 (×5): 160 ug via INTRAVENOUS

## 2024-04-05 MED ORDER — GLUCAGON HCL RDNA (DIAGNOSTIC) 1 MG IJ SOLR
INTRAMUSCULAR | Status: DC | PRN
  Administered 2024-04-05: 1 mg via INTRAVENOUS

## 2024-04-05 MED ORDER — PROPOFOL 10 MG/ML IV BOLUS
INTRAVENOUS | Status: DC | PRN
Start: 2024-04-05 — End: 2024-04-05
  Administered 2024-04-05: 100 mg via INTRAVENOUS

## 2024-04-05 MED ORDER — LACTATED RINGERS IV SOLN: INTRAVENOUS | Status: DC

## 2024-04-05 MED ORDER — LIDOCAINE 2% (20 MG/ML) 5 ML SYRINGE
INTRAMUSCULAR | Status: DC | PRN
  Administered 2024-04-05: 100 mg via INTRAVENOUS

## 2024-04-05 NOTE — Transfer of Care (Signed)
 Immediate Anesthesia Transfer of Care Note  Patient: Craig Scott  Procedure(s) Performed: COLONOSCOPY EGD (ESOPHAGOGASTRODUODENOSCOPY)  Patient Location: Short Stay  Anesthesia Type:General  Level of Consciousness: awake, alert , oriented, and patient cooperative  Airway & Oxygen Therapy: Patient Spontanous Breathing  Post-op Assessment: Report given to RN, Post -op Vital signs reviewed and stable, and Patient moving all extremities X 4  Post vital signs: Reviewed and stable  Last Vitals:  Vitals Value Taken Time  BP 93/44 04/05/24 09:14  Temp 36.5 C 04/05/24 09:09  Pulse 55 04/05/24 09:14  Resp 15 04/05/24 09:14  SpO2 99 % 04/05/24 09:13    Last Pain:  Vitals:   04/05/24 0914  TempSrc:   PainSc: 0-No pain         Complications: No notable events documented.

## 2024-04-05 NOTE — Discharge Instructions (Addendum)
 EGD Discharge instructions Please read the instructions outlined below and refer to this sheet in the next few weeks. These discharge instructions provide you with general information on caring for yourself after you leave the hospital. Your doctor may also give you specific instructions. While your treatment has been planned according to the most current medical practices available, unavoidable complications occasionally occur. If you have any problems or questions after discharge, please call your doctor. ACTIVITY You may resume your regular activity but move at a slower pace for the next 24 hours.  Take frequent rest periods for the next 24 hours.  Walking will help expel (get rid of) the air and reduce the bloated feeling in your abdomen.  No driving for 24 hours (because of the anesthesia (medicine) used during the test).  You may shower.  Do not sign any important legal documents or operate any machinery for 24 hours (because of the anesthesia used during the test).  NUTRITION Drink plenty of fluids.  You may resume your normal diet.  Begin with a light meal and progress to your normal diet.  Avoid alcoholic beverages for 24 hours or as instructed by your caregiver.  MEDICATIONS You may resume your normal medications unless your caregiver tells you otherwise.  WHAT YOU CAN EXPECT TODAY You may experience abdominal discomfort such as a feeling of fullness or "gas" pains.  FOLLOW-UP Your doctor will discuss the results of your test with you.  SEEK IMMEDIATE MEDICAL ATTENTION IF ANY OF THE FOLLOWING OCCUR: Excessive nausea (feeling sick to your stomach) and/or vomiting.  Severe abdominal pain and distention (swelling).  Trouble swallowing.  Temperature over 101 F (37.8 C).  Rectal bleeding or vomiting of blood.   Colonoscopy Discharge Instructions  Read the instructions outlined below and refer to this sheet in the next few weeks. These discharge instructions provide you with  general information on caring for yourself after you leave the hospital. Your doctor may also give you specific instructions. While your treatment has been planned according to the most current medical practices available, unavoidable complications occasionally occur. If you have any problems or questions after discharge, call Dr. Shaaron at [phone]. ACTIVITY You may resume your regular activity, but move at a slower pace for the next 24 hours.  Take frequent rest periods for the next 24 hours.  Walking will help get rid of the air and reduce the bloated feeling in your belly (abdomen).  No driving for 24 hours (because of the medicine (anesthesia) used during the test).   Do not sign any important legal documents or operate any machinery for 24 hours (because of the anesthesia used during the test).  NUTRITION Drink plenty of fluids.  You may resume your normal diet as instructed by your doctor.  Begin with a light meal and progress to your normal diet. Heavy or fried foods are harder to digest and may make you feel sick to your stomach (nauseated).  Avoid alcoholic beverages for 24 hours or as instructed.  MEDICATIONS You may resume your normal medications unless your doctor tells you otherwise.  WHAT YOU CAN EXPECT TODAY Some feelings of bloating in the abdomen.  Passage of more gas than usual.  Spotting of blood in your stool or on the toilet paper.  IF YOU HAD POLYPS REMOVED DURING THE COLONOSCOPY: No aspirin  products for 7 days or as instructed.  No alcohol for 7 days or as instructed.  Eat a soft diet for the next 24 hours.  FINDING OUT  THE RESULTS OF YOUR TEST Not all test results are available during your visit. If your test results are not back during the visit, make an appointment with your caregiver to find out the results. Do not assume everything is normal if you have not heard from your caregiver or the medical facility. It is important for you to follow up on all of your test  results.  SEEK IMMEDIATE MEDICAL ATTENTION IF: You have more than a spotting of blood in your stool.  Your belly is swollen (abdominal distention).  You are nauseated or vomiting.  You have a temperature over 101.  You have abdominal pain or discomfort that is severe or gets worse throughout the day.     Small intestine was inflamed today biopsies taken but ulcer  has healed   some inflammation in your distal esophagus-biopsies taken   3 small polyps found in your colon and removed  Scattered  diverticulosis   urine sample to be sent for cytology   further recommendations to follow pending review of lab report  Office visit with me in 6 months   at patient request, I called Craig Scott at [phone] -  reviewed findings and recommendations

## 2024-04-05 NOTE — Anesthesia Preprocedure Evaluation (Signed)
 Anesthesia Evaluation  Patient identified by MRN, date of birth, ID band Patient awake    Reviewed: Allergy & Precautions, H&P , NPO status , Patient's Chart, lab work & pertinent test results, reviewed documented beta blocker date and time   Airway Mallampati: II  TM Distance: >3 FB Neck ROM: full    Dental no notable dental hx.    Pulmonary neg pulmonary ROS, former smoker   Pulmonary exam normal breath sounds clear to auscultation       Cardiovascular Exercise Tolerance: Good hypertension, + angina  + CAD   Rhythm:regular Rate:Normal     Neuro/Psych  PSYCHIATRIC DISORDERS Anxiety Depression    negative neurological ROS     GI/Hepatic Neg liver ROS,GERD  ,,  Endo/Other  negative endocrine ROS    Renal/GU negative Renal ROS  negative genitourinary   Musculoskeletal   Abdominal   Peds  Hematology negative hematology ROS (+)   Anesthesia Other Findings   Reproductive/Obstetrics negative OB ROS                              Anesthesia Physical Anesthesia Plan  ASA: 3  Anesthesia Plan: General   Post-op Pain Management:    Induction:   PONV Risk Score and Plan: Propofol  infusion  Airway Management Planned:   Additional Equipment:   Intra-op Plan:   Post-operative Plan:   Informed Consent: I have reviewed the patients History and Physical, chart, labs and discussed the procedure including the risks, benefits and alternatives for the proposed anesthesia with the patient or authorized representative who has indicated his/her understanding and acceptance.     Dental Advisory Given  Plan Discussed with: CRNA  Anesthesia Plan Comments:         Anesthesia Quick Evaluation

## 2024-04-05 NOTE — Op Note (Addendum)
 [facility] Patient Name: Craig Scott Procedure Date: 04/05/2024 7:56 AM MRN: [id number] Date of Birth: 01/17/1956 Attending MD: Lamar Ozell Hollingshead , MD, [id number] CSN: [id number] Age: 68 Admit Type: Outpatient Procedure:                Upper GI endoscopy Indications:              Peptic ulcer Providers:                Lamar Ozell Hollingshead, MD, Crystal Page, [place]                            Wilson, Technician, Bascom Blush Referring MD:              Medicines:                Propofol  per Anesthesia Complications:            No immediate complications. Estimated Blood Loss:     Estimated blood loss was minimal. Procedure:                Pre-Anesthesia Assessment:                           - Prior to the procedure, a History and Physical                            was performed, and patient medications and                            allergies were reviewed. The patient's tolerance of                            previous anesthesia was also reviewed. The risks                            and benefits of the procedure and the sedation                            options and risks were discussed with the patient.                            All questions were answered, and informed consent                            was obtained. Prior Anticoagulants: The patient has                            taken no anticoagulant or antiplatelet agents. ASA                            Grade Assessment: III - A patient with severe                            systemic disease. After reviewing the risks and  benefits, the patient was deemed in satisfactory                            condition to undergo the procedure.                           After obtaining informed consent, the endoscope was                            passed under direct vision. Throughout the                            procedure, the patient's blood pressure, pulse, and                            oxygen  saturations were monitored continuously. The                            [id number]-H190 ([id number]) scope was introduced through the                            mouth, and advanced to the third part of duodenum.                            The upper GI endoscopy was accomplished without                            difficulty. The patient tolerated the procedure                            well. 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 Also 2 tongues       of salmon-colored epithelium coming up approximately 1.5 cm. No       esophagitis. No tumor. Inlet patch present.      Gastric cavity empty and mucosa appeared normal. Patent pylorus.      Examination bulb second third portion revealed marked erythema and       scattered erosions about the second portion of the duodenum. No ulcer       seen. Ampulla not well-seen. Biopsies of the duodenum taken. Biopsies of       the distal esophagus taken. 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                            Status post biopsy. Inlet patch present                           - Normal-appearing stomach                           -  Duodenal erosions erythema status post biopsy.                            Duodenal findings may be NSAID effect but                            appearance would be atypical. Moderate Sedation:      Moderate (conscious) sedation was personally administered by an       anesthesia professional. The following parameters were monitored: oxygen       saturation, heart rate, blood pressure, respiratory rate, EKG, adequacy       of pulmonary ventilation, and response to care. Recommendation:           - Patient has a contact number available for                            emergencies. The signs and  symptoms of potential                            delayed complications were discussed with the                            patient. Return to normal activities tomorrow.                            Written discharge instructions were provided to the                            patient.                           - Advance diet as tolerated. Follow-up on                            pathology. See colonoscopy report. Urine cytology                            today. Further recommendations to follow. Office                            visit in 6 months. Procedure Code(s):        --- Professional ---                           [id number], Esophagogastroduodenoscopy, flexible,                            transoral; diagnostic, including collection of                            specimen(s) by brushing or washing, when performed                            (separate procedure) Diagnosis Code(s):        --- Professional ---  K27.9, Peptic ulcer, site unspecified, unspecified                            as acute or chronic, without hemorrhage or                            perforation CPT copyright 2022 American [employer]. All rights reserved. The codes documented in this report are preliminary and upon coder review may  be revised to meet current compliance requirements. Lamar HERO. Jazzmyn Filion, MD Lamar Ozell Hollingshead, MD 04/05/2024 9:26:43 AM This report has been signed electronically. Number of Addenda: 1 Addendum Number: 1   Addendum Date: 04/10/2024 2:44:25 PM      Inlet patch found and photographed; not mentioned in text of note Chaseton Yepiz. Darielys Giglia, MD Lamar Ozell Hollingshead, MD 04/10/2024 2:45:20 PM This report has been signed electronically.

## 2024-04-05 NOTE — Op Note (Signed)
 [facility] Patient Name: Craig Scott Procedure Date: 04/05/2024 7:52 AM MRN: [id number] Date of Birth: 1956/07/11 Attending MD: Lamar Ozell Hollingshead , MD, [id number] CSN: [id number] Age: 68 Admit Type: Outpatient Procedure:                Colonoscopy Indications:              High risk colon cancer surveillance: Personal                            history of colonic polyps Providers:                Lamar Ozell Hollingshead, MD, Crystal Page, [place]                            Wilson, Technician, Bascom Blush Referring MD:              Medicines:                Propofol  per Anesthesia Complications:            No immediate complications. Estimated Blood Loss:     Estimated blood loss was minimal. Procedure:                Pre-Anesthesia Assessment:                           - Prior to the procedure, a History and Physical                            was performed, and patient medications and                            allergies were reviewed. The patient's tolerance of                            previous anesthesia was also reviewed. The risks                            and benefits of the procedure and the sedation                            options and risks were discussed with the patient.                            All questions were answered, and informed consent                            was obtained. Prior Anticoagulants: The patient has                            taken no anticoagulant or antiplatelet agents. ASA                            Grade Assessment: III - A patient with severe  systemic disease. After reviewing the risks and                            benefits, the patient was deemed in satisfactory                            condition to undergo the procedure.                           After obtaining informed consent, the colonoscope                            was passed under direct vision. Throughout the                             procedure, the patient's blood pressure, pulse, and                            oxygen saturations were monitored continuously. The                            [id number]) scope was introduced through                            the anus and advanced to the the cecum, identified                            by appendiceal orifice and ileocecal valve. The                            colonoscopy was performed without difficulty. The                            patient tolerated the procedure well. The quality                            of the bowel preparation was adequate. The                            ileocecal valve, appendiceal orifice, and rectum                            were photographed. The colonoscopy was performed                            without difficulty. The patient tolerated the                            procedure well. The quality of the bowel                            preparation was adequate. The entire colon was well  visualized. Scope In: 8:46:12 AM Scope Out: 9:04:31 AM Scope Withdrawal Time: 0 hours 13 minutes 39 seconds  Total Procedure Duration: 0 hours 18 minutes 19 seconds  Findings:      The perianal and digital rectal examinations were normal.      Three sessile polyps were found in the recto-sigmoid colon and hepatic       flexure. The polyps were 3 to 4 mm in size. These polyps were removed       with a cold snare. Resection and retrieval were complete. Estimated       blood loss was minimal.      A few medium-mouthed diverticula were found in the entire colon.      The exam was otherwise without abnormality on direct and retroflexion       views. Impression:               - Three 3 to 4 mm polyps at the recto-sigmoid colon                            and at the hepatic flexure, removed with a cold                            snare. Resected and retrieved.                           - Diverticulosis in the entire examined  colon.                           - The examination was otherwise normal on direct                            and retroflexion views. Moderate Sedation:      Moderate (conscious) sedation was personally administered by an       anesthesia professional. The following parameters were monitored: oxygen       saturation, heart rate, blood pressure, respiratory rate, EKG, adequacy       of pulmonary ventilation, and response to care. Recommendation:           - Patient has a contact number available for                            emergencies. The signs and symptoms of potential                            delayed complications were discussed with the                            patient. Return to normal activities tomorrow.                            Written discharge instructions were provided to the                            patient.                           - Advance diet  as tolerated.                           - Continue present medications.                           - Repeat colonoscopy date to be determined after                            pending pathology results are reviewed for                            surveillance.                           - Return to GI office in 6 months. See EGD report. Procedure Code(s):        --- Professional ---                           [phone], Colonoscopy, flexible; with removal of                            tumor(s), polyp(s), or other lesion(s) by snare                            technique Diagnosis Code(s):        --- Professional ---                           Z86.010, Personal history of colonic polyps                           D12.7, Benign neoplasm of rectosigmoid junction                           D12.3, Benign neoplasm of transverse colon (hepatic                            flexure or splenic flexure)                           K57.30, Diverticulosis of large intestine without                            perforation or abscess without bleeding CPT  copyright 2022 American Medical Association. All rights reserved. The codes documented in this report are preliminary and upon coder review may  be revised to meet current compliance requirements. Lamar HERO. Kerwin Augustus, MD Lamar Ozell Hollingshead, MD 04/05/2024 9:21:32 AM This report has been signed electronically. Number of Addenda: 0

## 2024-04-05 NOTE — H&P (Signed)
 @LOGO @   Primary Care Physician:  Bertell Satterfield, MD Primary Gastroenterologist:  Dr. Shaaron  Pre-Procedure History & Physical: HPI:  Craig Scott is a 68 y.o. male here for for surveillance EGD and follow-up colonoscopy.  Recent piecemeal removal of adenoma in his colon.  History of Lynch syndrome.  Past Medical History:  Diagnosis Date   Alcohol dependence, daily use (HCC)    Anginal pain (HCC)    Anxiety    Arthritis    ra   Back pain    Bursitis    Coronary artery disease    Depression    GERD (gastroesophageal reflux disease)    High cholesterol    History of concussion    several , last time yrs ago no deficits from   History of COVID-19    6 to 7 months ago symptoms all resolved per pt on 09-09-2021   History of hip surgery    Lyme disease    Lynch syndrome    has gene that causes cancer   Neuropathy    right leg since copperhead snake bite 16 yrs ago   Tubular adenoma    Urinary frequency    Wears glasses     Past Surgical History:  Procedure Laterality Date   BASAL CELL CARCINOMA EXCISION     BIOPSY  09/04/2021   Procedure: BIOPSY;  Surgeon: Shaaron Lamar HERO, MD;  [facility];  Service: Endoscopy;;   BIOPSY  09/16/2022   Procedure: BIOPSY;  Surgeon: Shaaron Lamar HERO, MD;  [facility];  Service: Endoscopy;;   BUNIONECTOMY WITH HAMMERTOE RECONSTRUCTION AND GASTROC SLIDE Right 06/27/2015   Procedure: RIGHT GASTROC RECESSION/POSTERIOR TIBIAL TENOLYSIS/SECOND AND THIRD METATARSAL WEIL OSTEOTOMY AND HAMMERTOE CORRECTION;  Surgeon: Norleen Armor, MD;  Location: [facility];  Service: Orthopedics;  Laterality: Right;   CALCANEAL OSTEOTOMY Right 06/27/2015   Procedure: CALCANEAL OSTEOTOMY;  Surgeon: Norleen Armor, MD;  Location: [facility];  Service: Orthopedics;  Laterality: Right;   COLONOSCOPY  12/09/2001   RMR: Normal normal rectum/ A few scattered left-sided diverticula.  The remainder of the colonic  mucosa  appeared normal   COLONOSCOPY  12/15/2002   RMR: Normal rectum, scattered left-sided diverticula.  The remainder of the colonic mucosa appeared normal   COLONOSCOPY  04/10/2004   RMR: Normal rectum/Sigmoid diverticula/The remainder of the colonic mucosa appeared normal   COLONOSCOPY  12/23/2005   RMR: Normal rectum, scattered sigmoid diverticula Colonic mucosa appeared normal   COLONOSCOPY  01/20/2008   RMR: Distal diminutive rectal polyps, status post cold biopsy removal  otherwise, normal rectum/ biopsy removal; scattered sigmoid diverticula; and benign-appearing/ulcers about the ileocecal valve, status post biopsy.  Remainder of colonic mucosa and terminal mucosa appeared normal.   COLONOSCOPY  05/22/2010   RMR: distal diminutive rectal polyp s/p bx otherwise normal/few scattered pancolonic diverticula   COLONOSCOPY  09/16/2012   Dr.Finnis Colee- normal rectum, few scattered pancolonic diverticulosis, one diminutive polyp in the base of the cecum. 3x3 area of hyper pigmentation i the mid descending segment, this was a flat benign-appearing area o/w the remainder of the colonic mucosa appeared normal. bx= tubular adenoma and benign colonic mucosa   COLONOSCOPY WITH PROPOFOL  N/A 09/05/2015   Procedure: COLONOSCOPY WITH PROPOFOL ;  Surgeon: Lamar HERO Shaaron, MD;  [facility];  Service: Endoscopy;  Laterality: N/A;  0830   COLONOSCOPY WITH PROPOFOL  N/A 05/24/2017   Procedure: COLONOSCOPY WITH PROPOFOL ;  Surgeon: Shaaron Lamar HERO, MD;  [facility];  Service: Endoscopy;  Laterality: N/A;   COLONOSCOPY WITH PROPOFOL  N/A 09/04/2021   Procedure: COLONOSCOPY WITH PROPOFOL ;  Surgeon: Shaaron Lamar HERO, MD;  [facility];  Service: Endoscopy;  Laterality: N/A;  1:15 / ASA III-DO NOT MOVE. Rep is coming for EXALT ERCP scope.   COLONOSCOPY WITH PROPOFOL  N/A 09/16/2022   Procedure: COLONOSCOPY WITH PROPOFOL ;  Surgeon: Shaaron Lamar HERO, MD;  [facility];  Service: Endoscopy;   Laterality: N/A;  7:30 am   COLONOSCOPY WITH PROPOFOL  N/A 09/27/2023   Procedure: COLONOSCOPY WITH PROPOFOL ;  Surgeon: Shaaron Lamar HERO, MD;  [facility];  Service: Endoscopy;  Laterality: N/A;  9:45 am, asa 2   colonscopy  09/04/2021   @ [place]   CYSTOSCOPY WITH BIOPSY N/A 09/12/2021   Procedure: CYSTOSCOPY WITH BLADDERBIOPSY/ FULGURATION/ INSTILLATION OF MARCAINE /PYRIDIUM ;  Surgeon: Nieves Cough, MD;  Location[facility];  Service: Urology;  Laterality: N/A;   ESOPHAGOGASTRODUODENOSCOPY  12/23/2005   RMR:   Esophagogastric peptic stricture with reflux esophagitis, status post  dilation as described above/ Small hiatal hernia, otherwise normal stomach.  Bulbar erosions otherwise normal D1 and D2   ESOPHAGOGASTRODUODENOSCOPY (EGD) WITH PROPOFOL   09/04/2021   Procedure: ESOPHAGOGASTRODUODENOSCOPY (EGD) WITH PROPOFOL ;  Surgeon: Shaaron Lamar HERO, MD;  [facility];  Service: Endoscopy;;   ESOPHAGOGASTRODUODENOSCOPY (EGD) WITH PROPOFOL   09/16/2022   Procedure: ESOPHAGOGASTRODUODENOSCOPY (EGD) WITH PROPOFOL ;  Surgeon: Shaaron Lamar HERO, MD;  [facility];  Service: Endoscopy;;   EYE SURGERY  as child   FOOT ARTHRODESIS Right 06/17/2017   Procedure: Right Talonavicular and Subtalar Arthrodesis;  Surgeon: Kit Rush, MD;  Location: [facility];  Service: Orthopedics;  Laterality: Right;   HARDWARE REMOVAL Right 06/25/2016   Procedure: REMOVAL OF DEEP IMPLANTS RIGHT MEDIAL CUNEIFORM, RIGHT CALCANEAL REMOVAL OF DEEP IMPLANTS;  Surgeon: Rush Kit, MD;  Location: [facility];  Service: Orthopedics;  Laterality: Right;   HEMOSTASIS CLIP PLACEMENT  09/27/2023   Procedure: HEMOSTASIS CLIP PLACEMENT;  Surgeon: Shaaron Lamar HERO, MD;  [facility];  Service: Endoscopy;;   JOINT REPLACEMENT  09/30/2010   Right knee   KNEE SURGERY Right    bilat    NOSE SURGERY Left    cartiledge removal   POLYPECTOMY  09/05/2015    Procedure: POLYPECTOMY;  Surgeon: Lamar HERO Shaaron, MD;  [facility];  Service: Endoscopy;;   POLYPECTOMY  05/24/2017   Procedure: POLYPECTOMY;  Surgeon: Shaaron Lamar HERO, MD;  [facility];  Service: Endoscopy;;  colon   POLYPECTOMY  09/04/2021   Procedure: POLYPECTOMY;  Surgeon: Shaaron Lamar HERO, MD;  [facility];  Service: Endoscopy;;   POLYPECTOMY  09/16/2022   Procedure: POLYPECTOMY;  Surgeon: Shaaron Lamar HERO, MD;  [facility];  Service: Endoscopy;;   POLYPECTOMY  09/27/2023   Procedure: POLYPECTOMY;  Surgeon: Shaaron Lamar HERO, MD;  [facility];  Service: Endoscopy;;   RHINOPLASTY     TOTAL HIP ARTHROPLASTY Left 03/20/2015   Procedure: LEFT TOTAL HIP ARTHROPLASTY ANTERIOR APPROACH;  Surgeon: Dempsey Moan, MD;  Location: [facility];  Service: Orthopedics;  Laterality: Left;   TOTAL KNEE ARTHROPLASTY Left 10/23/2013   Procedure: LEFT TOTAL KNEE ARTHROPLASTY;  Surgeon: Dempsey LULLA Moan, MD;  Location: [facility];  Service: Orthopedics;  Laterality: Left;   TYMPANOSTOMY TUBE PLACEMENT Right    early 2014 and multiple times   TYMPANOSTOMY TUBE PLACEMENT  Prior to Admission medications   Medication Sig Start Date End Date Taking? Authorizing Provider  aspirin  EC 81 MG tablet Take 1 tablet (81 mg total) by mouth 2 (two) times daily. 06/17/17  Yes Aniceto Eva Grebe, PA-C  celecoxib (CELEBREX) 200 MG capsule Take 200 mg by mouth daily.   Yes [provider]  cetirizine (ZYRTEC) 10 MG tablet Take 10 mg by mouth daily.   Yes [provider]  cyclobenzaprine (FLEXERIL) 10 MG tablet Take 10 mg by mouth 3 (three) times daily. 02/21/24  Yes [provider]  nitroGLYCERIN  (NITROSTAT ) 0.4 MG SL tablet Place 1 tablet (0.4 mg total) under the tongue every 5 (five) minutes as needed. 03/09/24  Yes Chandrasekhar, Mahesh A, MD  Potassium 99 MG TABS Take 99 mg by mouth daily.   Yes [provider]  pregabalin (LYRICA) 25 MG  capsule Take 25 mg by mouth daily.   Yes [provider]  simvastatin  (ZOCOR ) 20 MG tablet Take 20 mg by mouth daily.   Yes [provider]  solifenacin (VESICARE) 5 MG tablet Take 5 mg by mouth daily. 08/31/23  Yes [provider]  tamsulosin (FLOMAX) 0.4 MG CAPS capsule Take 0.4 mg by mouth daily. 09/02/23  Yes [provider]  ALPRAZolam  (XANAX ) 1 MG tablet Take 1 mg by mouth 4 (four) times daily as needed for anxiety.    [provider]  Cholecalciferol (VITAMIN D3) 125 MCG (5000 UT) CAPS Take 5,000 Units by mouth daily.    [provider]  ENBREL SURECLICK 50 MG/ML injection Inject 50 mg into the skin once a week. 09/09/23   [provider]  escitalopram (LEXAPRO) 10 MG tablet Take 10 mg by mouth daily.    [provider]  GAVILYTE-G 236 g solution as directed. colonoscopy 03/07/24   [provider]    Allergies as of 03/07/2024 - Review Complete 03/07/2024  Allergen Reaction Noted   Doxycycline   06/30/2022   Lyrica [pregabalin] Other (See Comments) 03/14/2015    Family History  Problem Relation Age of Onset   Colon cancer Brother        39    Colon cancer Brother        38   Colon cancer Mother        age 70    Dementia Mother    Arthritis Mother    Heart attack Father    Colon cancer Cousin        69 years old onset   Colon cancer Cousin        49 years onset    Social History   Socioeconomic History   Marital status: Married    Spouse name: Not on file   Number of children: 2   Years of education: 12   Highest education level: Not on file  Occupational History   Occupation: [employer]ist: [employer]    Comment: [place]  Tobacco Use   Smoking status: Former    Current packs/day: 0.00    Average packs/day: 0.5 packs/day for 30.0 years (15.0 ttl pk-yrs)    Types: Cigars, Cigarettes    Start date: 09/15/1975    Quit date: 09/14/2005    Years since quitting: 18.5   Smokeless  tobacco: Never  Vaping Use   Vaping status: Never Used  Substance and Sexual Activity   Alcohol use: Yes    Comment: 4-5 beer per day or none for a couple of days  Drug use: Yes    Types: Marijuana    Comment: no marijuana use in 25 yrs per pt on 09-09-2021/smokes occasionally   Sexual activity: Yes    Birth control/protection: None  Other Topics Concern   Not on file  Social History Narrative   ** Merged History Encounter **       Social Drivers of Corporate investment banker Strain: Not on file  Food Insecurity: Not on file  Transportation Needs: Not on file  Physical Activity: Not on file  Stress: Not on file  Social Connections: Not on file  Intimate Partner Violence: Not on file    Review of Systems: See HPI, otherwise negative ROS  Physical Exam: BP 136/70   Pulse 60   Temp 98.3 F (36.8 C) (Oral)   Ht 6' (1.829 m)   Wt 93.4 kg   SpO2 100%   BMI 27.94 kg/m  General:   Alert,  Well-developed, well-nourished, pleasant and cooperative in NAD Neck:  Supple; no masses or thyromegaly. No significant cervical adenopathy. Lungs:  Clear throughout to auscultation.   No wheezes, crackles, or rhonchi. No acute distress. Heart:  Regular rate and rhythm; no murmurs, clicks, rubs,  or gallops. Abdomen: Non-distended, normal bowel sounds.  Soft and nontender without appreciable mass or hepatosplenomegaly.   Impression/Plan: History of gastric ulcer.  History of multiple colonic polyps.  Lynch syndrome.  Here for Rob surveillance EGD and colonoscopy. The risks, benefits, limitations, imponderables and alternatives regarding both EGD and colonoscopy have been reviewed with the patient. Questions have been answered. All parties agreeable.       Notice: This dictation was prepared with Dragon dictation along with smaller phrase technology. Any transcriptional errors that result from this process are unintentional and may not be corrected upon review.

## 2024-04-06 ENCOUNTER — Telehealth: Payer: Self-pay

## 2024-04-06 ENCOUNTER — Encounter (HOSPITAL_COMMUNITY): Payer: Self-pay | Admitting: Internal Medicine

## 2024-04-06 NOTE — Telephone Encounter (Signed)
 Pt was made aware and verbalized understanding.

## 2024-04-06 NOTE — Telephone Encounter (Signed)
 Pt called stating that he had a colonoscopy yesterday and that he seen a small amt of blood yesterday but today the toilet is full of blood. Pt is wanting to know what he should do. Please advise.

## 2024-04-07 DIAGNOSIS — E663 Overweight: Secondary | ICD-10-CM | POA: Diagnosis not present

## 2024-04-07 DIAGNOSIS — M79671 Pain in right foot: Secondary | ICD-10-CM | POA: Diagnosis not present

## 2024-04-07 DIAGNOSIS — R5382 Chronic fatigue, unspecified: Secondary | ICD-10-CM | POA: Diagnosis not present

## 2024-04-07 DIAGNOSIS — M1991 Primary osteoarthritis, unspecified site: Secondary | ICD-10-CM | POA: Diagnosis not present

## 2024-04-07 DIAGNOSIS — M0579 Rheumatoid arthritis with rheumatoid factor of multiple sites without organ or systems involvement: Secondary | ICD-10-CM | POA: Diagnosis not present

## 2024-04-07 DIAGNOSIS — Z6828 Body mass index (BMI) 28.0-28.9, adult: Secondary | ICD-10-CM | POA: Diagnosis not present

## 2024-04-07 LAB — SURGICAL PATHOLOGY

## 2024-04-08 NOTE — Anesthesia Postprocedure Evaluation (Signed)
 Anesthesia Post Note  Patient: Craig Scott  Procedure(s) Performed: COLONOSCOPY EGD (ESOPHAGOGASTRODUODENOSCOPY)  Patient location during evaluation: Phase II Anesthesia Type: General Level of consciousness: awake Pain management: pain level controlled Vital Signs Assessment: post-procedure vital signs reviewed and stable Respiratory status: spontaneous breathing and respiratory function stable Cardiovascular status: blood pressure returned to baseline and stable Postop Assessment: no headache and no apparent nausea or vomiting Anesthetic complications: no Comments: Late entry   No notable events documented.   Last Vitals:  Vitals:   04/05/24 0914 04/05/24 0919  BP: (!) 93/44 (!) 97/55  Pulse: (!) 55   Resp: 15   Temp:    SpO2:  99%    Last Pain:  Vitals:   04/06/24 1333  TempSrc:   PainSc: 0-No pain                 Yvonna JINNY Bosworth

## 2024-04-13 DIAGNOSIS — G6289 Other specified polyneuropathies: Secondary | ICD-10-CM | POA: Diagnosis not present

## 2024-04-16 ENCOUNTER — Ambulatory Visit: Payer: Self-pay | Admitting: Internal Medicine

## 2024-04-17 ENCOUNTER — Other Ambulatory Visit: Payer: Self-pay

## 2024-04-17 ENCOUNTER — Telehealth: Payer: Self-pay

## 2024-04-17 DIAGNOSIS — Z1509 Genetic susceptibility to other malignant neoplasm: Secondary | ICD-10-CM

## 2024-04-17 DIAGNOSIS — R7989 Other specified abnormal findings of blood chemistry: Secondary | ICD-10-CM | POA: Diagnosis not present

## 2024-04-17 MED ORDER — PANTOPRAZOLE SODIUM 40 MG PO TBEC: 40.0000 mg | DELAYED_RELEASE_TABLET | Freq: Every day | ORAL | 11 refills | Status: AC

## 2024-04-17 NOTE — Telephone Encounter (Signed)
 Rx for pantoprazole  was sent to pharmacy on file. Lab orders for urine cytology was sent to the lab. Pt was made aware and instructed to have lab done as soon as possible.

## 2024-04-17 NOTE — Progress Notes (Signed)
 error

## 2024-04-17 NOTE — Telephone Encounter (Signed)
-----   Message from Lamar Hollingshead sent at 04/16/2024  8:55 PM EDT -----  2 letters going out to pt regarding biopsies  New Rx Protonix  40 mg daily disp 30 with 11 refills.  Please let him know.  Endo was supposed to send urine for cytology; if this has not been done, lets get it done .  thanks

## 2024-04-21 LAB — CYTOLOGY, URINE

## 2024-04-23 ENCOUNTER — Ambulatory Visit: Payer: Self-pay | Admitting: Internal Medicine

## 2024-04-25 ENCOUNTER — Ambulatory Visit: Admitting: Internal Medicine

## 2024-05-25 DIAGNOSIS — M79671 Pain in right foot: Secondary | ICD-10-CM | POA: Diagnosis not present

## 2024-05-25 DIAGNOSIS — M25552 Pain in left hip: Secondary | ICD-10-CM | POA: Diagnosis not present

## 2024-05-25 DIAGNOSIS — M0579 Rheumatoid arthritis with rheumatoid factor of multiple sites without organ or systems involvement: Secondary | ICD-10-CM | POA: Diagnosis not present

## 2024-05-25 DIAGNOSIS — R5382 Chronic fatigue, unspecified: Secondary | ICD-10-CM | POA: Diagnosis not present

## 2024-05-25 DIAGNOSIS — M1991 Primary osteoarthritis, unspecified site: Secondary | ICD-10-CM | POA: Diagnosis not present

## 2024-05-25 DIAGNOSIS — Z6828 Body mass index (BMI) 28.0-28.9, adult: Secondary | ICD-10-CM | POA: Diagnosis not present

## 2024-05-25 DIAGNOSIS — E663 Overweight: Secondary | ICD-10-CM | POA: Diagnosis not present

## 2024-05-31 DIAGNOSIS — M21969 Unspecified acquired deformity of unspecified lower leg: Secondary | ICD-10-CM | POA: Diagnosis not present

## 2024-05-31 DIAGNOSIS — M069 Rheumatoid arthritis, unspecified: Secondary | ICD-10-CM | POA: Diagnosis not present

## 2024-05-31 DIAGNOSIS — M1990 Unspecified osteoarthritis, unspecified site: Secondary | ICD-10-CM | POA: Diagnosis not present

## 2024-05-31 DIAGNOSIS — M79609 Pain in unspecified limb: Secondary | ICD-10-CM | POA: Diagnosis not present

## 2024-05-31 DIAGNOSIS — M19279 Secondary osteoarthritis, unspecified ankle and foot: Secondary | ICD-10-CM | POA: Diagnosis not present

## 2024-05-31 DIAGNOSIS — R202 Paresthesia of skin: Secondary | ICD-10-CM | POA: Diagnosis not present

## 2024-06-05 ENCOUNTER — Encounter: Payer: Self-pay | Admitting: *Deleted

## 2024-06-05 NOTE — Progress Notes (Unsigned)
 OFFICE NOTE:    Date:  06/06/2024  ID:  Craig Scott Husband, DOB 1956-03-18, MRN [id number] PCP: Bertell Satterfield, MD  [facility] Providers Cardiologist:  None        Coronary artery disease  CCTA 03/20/24: mild mixed nonobstructive CAD; CAC score 370 (73rd percentile), TPV 429 mm3 (73 mm3 or 17% Ca2+, 356 mm3 or 83% noncalcific plaque) - TPV is severe; mildly dilated main PA (30 mm) Rheumatoid arthritis  Aortic atherosclerosis  FHx of CAD  Fibromyalgia Hx of falls Lynch syndrome         Discussed the use of AI scribe software for clinical note transcription with the patient, who gave verbal consent to proceed. History of Present Illness Craig Scott is a 68 y.o. male who returns fo follow up of CAD. He was evaluated by Dr Santo in 02/2024 for chest pain. CCTA was obtained and demonstrated mild mixed calcific and noncalcific plaque with severe total plaque volume. The main PA was dilated suggestive of pulmonary hypertension.   He is here alone.  He has reduced his alcohol consumption significantly, now drinking three to four beers occasionally instead of ten to twelve daily. He experiences occasional chest pain triggered by stress, for which he has used nitroglycerin  three or four times. The nitroglycerin  is effective when used. He reports that since the CT scan, he has not had to take any more nitroglycerin  and has not experienced chest pain, as he avoids stressors. No shortness of breath, syncope, or leg swelling. He has lost about 40 pounds recently. He has a history of smoking cigarettes and cigars, but currently smokes marijuana occasionally.    ROS-See HPI    Studies Reviewed:      03/06/24: K 4.1, SCr 0.80, ALT 35, Hgb 14.3,  05/13/2023 (Per [facility]): Total cholesterol 127, HDL 41, LDL 66, triglycerides 106        Physical Exam:  VS:  BP 124/68   Pulse 75   Ht 5' 10 (1.778 m)   Wt 207 lb 6.4 oz (94.1 kg)   SpO2 96%   BMI 29.76 kg/m        Wt Readings  from Last 3 Encounters:  06/06/24 207 lb 6.4 oz (94.1 kg)  04/05/24 206 lb (93.4 kg)  04/03/24 (P) 210 lb 15.7 oz (95.7 kg)    Constitutional:      Appearance: Healthy appearance. Not in distress.  Neck:     Vascular: JVD normal.  Pulmonary:     Breath sounds: Normal breath sounds. No wheezing. No rales.  Cardiovascular:     Normal rate. Regular rhythm.     Murmurs: There is no murmur.  Edema:    Peripheral edema absent.  Abdominal:     Palpations: Abdomen is soft.       Assessment and Plan:    Assessment & Plan Coronary artery disease involving native coronary artery of native heart without angina pectoris CCTA recently demonstrated mild mixed calcific and noncalcific plaque with severe total plaque volume. The main PA was dilated suggestive of pulmonary hypertension. He mainly has chest pain with emotional stress. He has not had exertional chest pain. At this point, I do not think he needs an antianginal like long acting nitrates, beta-blocker or calcium channel blocker. We discussed risk factor modification. He is not a diabetic. He does not smoke cigarettes but smokes marijuana occasionally. BP is well controlled. Would aim for goal LDL < 55.  -Continue ASA 81 mg once daily, Simvastatin   20 mg once daily, NTG prn -Arrange fasting lipids, Lp(a). Goal LDL < 55 -Rec 150 mins aerobic activity per week -We discussed the benefits of plant based, whole foods diet. Limit sat fats, simple carbs, etc. -Advised against smoking, including marijuana to reduce risk of ACS  -Would have low threshold to start ACE/ARB if BP increases -Follow up 6 mos. Pure hypercholesterolemia Arrange fasting Lipids, Lp(a). If LDL > 55, will adjust Simvastatin . If Lp(a) elevated, consider PCSK9i. Dilation of pulmonary artery (HCC) Arrange Echocardiogram to evaluate for pulmonary hypertension.         Dispo:  Return in about 6 months (around 12/04/2024) for Routine Follow Up, w/ Dr. Santo, or Glendia Ferrier, PA-C.  Signed, Glendia Ferrier, PA-C

## 2024-06-06 ENCOUNTER — Encounter: Payer: Self-pay | Admitting: Physician Assistant

## 2024-06-06 ENCOUNTER — Ambulatory Visit: Attending: Physician Assistant | Admitting: Physician Assistant

## 2024-06-06 VITALS — BP 124/68 | HR 75 | Ht 70.0 in | Wt 207.4 lb

## 2024-06-06 DIAGNOSIS — E7800 Pure hypercholesterolemia, unspecified: Secondary | ICD-10-CM

## 2024-06-06 DIAGNOSIS — I288 Other diseases of pulmonary vessels: Secondary | ICD-10-CM | POA: Diagnosis not present

## 2024-06-06 DIAGNOSIS — I2510 Atherosclerotic heart disease of native coronary artery without angina pectoris: Secondary | ICD-10-CM | POA: Diagnosis not present

## 2024-06-06 NOTE — Patient Instructions (Addendum)
 Medication Instructions:  Your physician recommends that you continue on your current medications as directed. Please refer to the Current Medication list given to you today.  *If you need a refill on your cardiac medications before your next appointment, please call your pharmacy*  Lab Work: NEXT COUPLE DAYS, YOU CAN GO TO [facility] IN [place] OR HERE FASTING FOR:  LIPID & LPa  If you have labs (blood work) drawn today and your tests are completely normal, you will receive your results only by: MyChart Message (if you have MyChart) OR A paper copy in the mail If you have any lab test that is abnormal or we need to change your treatment, we will call you to review the results.  Testing/Procedures: Your physician has requested that you have an echocardiogram. Echocardiography is a painless test that uses sound waves to create images of your heart. It provides your doctor with information about the size and shape of your heart and how well your heart's chambers and valves are working. This procedure takes approximately one hour. There are no restrictions for this procedure. Please do NOT wear cologne, perfume, aftershave, or lotions (deodorant is allowed). Please arrive 15 minutes prior to your appointment time.  Please note: We ask at that you not bring children with you during ultrasound (echo/ vascular) testing. Due to room size and safety concerns, children are not allowed in the ultrasound rooms during exams. Our front office staff cannot provide observation of children in our lobby area while testing is being conducted. An adult accompanying a patient to their appointment will only be allowed in the ultrasound room at the discretion of the ultrasound technician under special circumstances. We apologize for any inconvenience.   Follow-Up: At [facility], you and your health needs are our priority.  As part of our continuing mission to provide you with exceptional heart care, our  providers are all part of one team.  This team includes your primary Cardiologist (physician) and Advanced Practice Providers or APPs (Physician Assistants and Nurse Practitioners) who all work together to provide you with the care you need, when you need it.  Your next appointment:   6 month(s)  Provider:   Dr. Santo or Glendia Ferrier, PA-C          We recommend signing up for the patient portal called MyChart.  Sign up information is provided on this After Visit Summary.  MyChart is used to connect with patients for Virtual Visits (Telemedicine).  Patients are able to view lab/test results, encounter notes, upcoming appointments, etc.  Non-urgent messages can be sent to your provider as well.   To learn more about what you can do with MyChart, go to [email or web address].   Other Instructions

## 2024-06-29 ENCOUNTER — Ambulatory Visit (HOSPITAL_COMMUNITY)
Admission: RE | Admit: 2024-06-29 | Discharge: 2024-06-29 | Disposition: A | Discharge status: Home / Self Care | Source: Ambulatory Visit | Attending: Internal Medicine | Admitting: Internal Medicine

## 2024-06-29 DIAGNOSIS — R008 Other abnormalities of heart beat: Secondary | ICD-10-CM | POA: Diagnosis not present

## 2024-06-29 DIAGNOSIS — E7800 Pure hypercholesterolemia, unspecified: Secondary | ICD-10-CM | POA: Insufficient documentation

## 2024-06-29 DIAGNOSIS — I2510 Atherosclerotic heart disease of native coronary artery without angina pectoris: Secondary | ICD-10-CM | POA: Insufficient documentation

## 2024-06-29 DIAGNOSIS — I288 Other diseases of pulmonary vessels: Secondary | ICD-10-CM | POA: Insufficient documentation

## 2024-06-29 LAB — ECHOCARDIOGRAM COMPLETE
Area-P 1/2: 2.22 cm2
S' Lateral: 2.8 cm

## 2024-06-29 NOTE — Progress Notes (Signed)
*  PRELIMINARY RESULTS* Echocardiogram 2D Echocardiogram has been performed.  Craig Scott 06/29/2024, 10:13 AM

## 2024-06-30 ENCOUNTER — Ambulatory Visit: Payer: Self-pay | Admitting: Physician Assistant

## 2024-06-30 ENCOUNTER — Encounter: Payer: Self-pay | Admitting: Physician Assistant

## 2024-06-30 DIAGNOSIS — E7800 Pure hypercholesterolemia, unspecified: Secondary | ICD-10-CM

## 2024-06-30 DIAGNOSIS — I2510 Atherosclerotic heart disease of native coronary artery without angina pectoris: Secondary | ICD-10-CM

## 2024-07-22 LAB — LIPOPROTEIN A (LPA): Lipoprotein (a): 212 nmol/L — ABNORMAL HIGH (ref <75.0)

## 2024-07-22 LAB — LIPID PANEL
Chol/HDL Ratio: 4.1 ratio (ref 0.0–5.0)
Cholesterol, Total: 131 mg/dL (ref 100–199)
HDL: 32 mg/dL — ABNORMAL LOW (ref >39)
LDL Chol Calc (NIH): 73 mg/dL (ref 0–99)
Triglycerides: 151 mg/dL — ABNORMAL HIGH (ref 0–149)
VLDL Cholesterol Cal: 26 mg/dL (ref 5–40)

## 2024-08-01 NOTE — Telephone Encounter (Signed)
 LVM asking pt to call our office to discuss lab results 1sttempt

## 2024-08-15 MED ORDER — NITROGLYCERIN 0.4 MG SL SUBL: 0.4000 mg | SUBLINGUAL_TABLET | SUBLINGUAL | 3 refills | Status: AC | PRN

## 2024-08-15 NOTE — Telephone Encounter (Signed)
 Patient notified

## 2024-08-15 NOTE — Telephone Encounter (Signed)
-----   Message from Glendia Ferrier sent at 08/01/2024 10:28 AM EST ----- Patient did not review MyChart results. Please call patient and review MyChart results. Glendia Ferrier, PA-C    08/01/2024 10:27 AM   ----- Message ----- From: Rebecka Memos Lab Results In Sent: 07/21/2024   6:38 AM EST To: Glendia ONEIDA Ferrier, PA-C

## 2024-08-25 ENCOUNTER — Telehealth: Payer: Self-pay

## 2024-08-25 ENCOUNTER — Other Ambulatory Visit (HOSPITAL_COMMUNITY): Payer: Self-pay

## 2024-08-25 ENCOUNTER — Other Ambulatory Visit: Payer: Self-pay

## 2024-08-25 ENCOUNTER — Encounter: Payer: Self-pay | Admitting: Cardiovascular Disease

## 2024-08-25 ENCOUNTER — Ambulatory Visit: Attending: Cardiology

## 2024-08-25 ENCOUNTER — Telehealth: Payer: Self-pay | Admitting: Pharmacy Technician

## 2024-08-25 ENCOUNTER — Other Ambulatory Visit (HOSPITAL_COMMUNITY): Admission: AD | Admit: 2024-08-25 | Source: Home / Self Care

## 2024-08-25 ENCOUNTER — Ambulatory Visit: Admitting: Cardiovascular Disease

## 2024-08-25 VITALS — BP 110/70 | HR 81 | Ht 72.0 in | Wt 221.6 lb

## 2024-08-25 DIAGNOSIS — I2510 Atherosclerotic heart disease of native coronary artery without angina pectoris: Secondary | ICD-10-CM

## 2024-08-25 DIAGNOSIS — I700 Atherosclerosis of aorta: Secondary | ICD-10-CM

## 2024-08-25 DIAGNOSIS — E7800 Pure hypercholesterolemia, unspecified: Secondary | ICD-10-CM | POA: Diagnosis not present

## 2024-08-25 DIAGNOSIS — R079 Chest pain, unspecified: Secondary | ICD-10-CM

## 2024-08-25 DIAGNOSIS — R072 Precordial pain: Secondary | ICD-10-CM

## 2024-08-25 DIAGNOSIS — I25118 Atherosclerotic heart disease of native coronary artery with other forms of angina pectoris: Secondary | ICD-10-CM

## 2024-08-25 LAB — TROPONIN I (HIGH SENSITIVITY): Troponin I (High Sensitivity): 3 ng/L (ref <18)

## 2024-08-25 MED ORDER — REPATHA SURECLICK 140 MG/ML ~~LOC~~ SOAJ: 140.0000 mg | SUBCUTANEOUS | 1 refills | Status: AC

## 2024-08-25 NOTE — Assessment & Plan Note (Addendum)
 Assessment:  LDL goal: < 55 mg/dl; last LDLc  73 mg/dl (88/7974) Lpa 787 nmol/L Has tolerated moderate intensity statin well without any side effects for years; Would likely benefit from high intensity statin but will keep simvastatin  due to tolerance and patient preference  Liver enzymes WNL Discussed next potential options (PCSK-9 inhibitors); cost, dosing efficacy, side effects  Patient willing to proceed with PCSK9i Ongoing chest pain for past three days and some arm numbness today; will contact DOD to see if he can be evaluated today - Reviewed symptoms of stroke and MI and when to see immediate attention  Plan: Continue taking simvastatin  20 mg daily Will apply for PA for PCSK9i; will inform patient upon approval  Lipid lab and Lpa due in 3 months after starting PCSK9i  Spoke with Dr. Michele regarding the patients chest pain and arm numbness; patient will be evaluated and an EKG will be obtained today

## 2024-08-25 NOTE — Progress Notes (Signed)
 Cardiology Office Note:    Date:  08/25/2024   ID:  Craig Scott, DOB [birth year], MRN [id number]  PCP:  Marvine Rush, MD   [facility] Providers Cardiologist:  None     Referring MD: Marvine Rush, MD   Chief Complaint  Patient presents with   Chest Pain    History of Present Illness:    Craig Scott is a 68 y.o. male with a hx of:  Coronary artery disease  CCTA 03/20/24: mild mixed nonobstructive CAD; CAC score 370 (73rd percentile), TPV 429 mm3 (73 mm3 or 17% Ca2+, 356 mm3 or 83% noncalcific plaque) - TPV is severe; mildly dilated main PA (30 mm) Rheumatoid arthritis  Aortic atherosclerosis  FHx of CAD  Fibromyalgia Hx of falls Lynch syndrome    The patient is here with his wife today.  He has experienced 3 days of left-sided chest pressure along with arm discomfort.  States that his symptoms have been constant over that 3-day period.  Symptoms are not worsened by physical activity.  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  No shortness of breath, heart palpitations, or other complaints.  Current Medications: Active Medications[1]   Allergies:   Doxycycline  and Lyrica [pregabalin]   ROS:   Please see the history of present illness.    All other systems reviewed and are negative.  EKGs/Labs/Other Studies Reviewed:    The following studies were reviewed today: Cardiac Studies & Procedures   ______________________________________________________________________________________________     ECHOCARDIOGRAM  ECHOCARDIOGRAM COMPLETE 06/29/2024  Narrative ECHOCARDIOGRAM REPORT    Patient Name:   Craig Scott Date of Exam: 06/29/2024 Medical Rec #:  [id number]        Height:       70.0 in Accession #:    [id number]       Weight:       207.4 lb Date of Birth:  1956/02/04        BSA:          2.120 m Patient Age:    68 years         BP:           112/70 mmHg Patient Gender: M                 HR:           80 bpm. Exam Location:  [facility]  Procedure: 2D Echo, Cardiac Doppler and Color Doppler (Both Spectral and Color Flow Doppler were utilized during procedure).  Indications:    Other abnormalities of the heart R00.8  History:        Patient has no prior history of Echocardiogram examinations. CAD; Risk Factors:Dyslipidemia. Hx of COVID-19.  Sonographer:    Aida Pizza RCS Referring Phys: [id number] SCOTT T WEAVER  IMPRESSIONS   1. Left ventricular ejection fraction, by estimation, is 60 to 65%. The left ventricle has normal function. The left ventricle has no regional wall motion abnormalities. Left ventricular diastolic parameters were normal. 2. Right ventricular systolic function is normal. The right ventricular size is normal. There is normal pulmonary artery systolic pressure. The estimated right ventricular systolic pressure is 21.0 mmHg. 3. The mitral valve is normal in structure. No evidence of mitral valve regurgitation. No evidence of mitral stenosis. 4. The aortic valve is normal in structure. Aortic valve regurgitation is not visualized. No aortic stenosis is present. 5. The inferior vena cava is  normal in size with greater than 50% respiratory variability, suggesting right atrial pressure of 3 mmHg.  FINDINGS Left Ventricle: Left ventricular ejection fraction, by estimation, is 60 to 65%. The left ventricle has normal function. The left ventricle has no regional wall motion abnormalities. The left ventricular internal cavity size was normal in size. There is no left ventricular hypertrophy. Left ventricular diastolic parameters were normal. Normal left ventricular filling pressure.  Right Ventricle: The right ventricular size is normal. No increase in right ventricular wall thickness. Right ventricular systolic function is normal. There is normal pulmonary artery systolic pressure. The tricuspid regurgitant velocity is 2.12 m/s, and with an assumed right atrial  pressure of 3 mmHg, the estimated right ventricular systolic pressure is 21.0 mmHg.  Left Atrium: Left atrial size was normal in size.  Right Atrium: Right atrial size was normal in size.  Pericardium: There is no evidence of pericardial effusion.  Mitral Valve: The mitral valve is normal in structure. Mild to moderate mitral annular calcification. No evidence of mitral valve regurgitation. No evidence of mitral valve stenosis.  Tricuspid Valve: The tricuspid valve is normal in structure. Tricuspid valve regurgitation is trivial. No evidence of tricuspid stenosis.  Aortic Valve: The aortic valve is normal in structure. Aortic valve regurgitation is not visualized. No aortic stenosis is present.  Pulmonic Valve: The pulmonic valve was normal in structure. Pulmonic valve regurgitation is trivial. No evidence of pulmonic stenosis.  Aorta: The aortic root is normal in size and structure.  Venous: The inferior vena cava is normal in size with greater than 50% respiratory variability, suggesting right atrial pressure of 3 mmHg.  IAS/Shunts: No atrial level shunt detected by color flow Doppler.   LEFT VENTRICLE PLAX 2D LVIDd:         4.00 cm   Diastology LVIDs:         2.80 cm   LV e' medial:    7.58 cm/s LV PW:         1.00 cm   LV E/e' medial:  10.1 LV IVS:        1.00 cm   LV e' lateral:   9.45 cm/s LVOT diam:     1.90 cm   LV E/e' lateral: 8.1 LV SV:         78 LV SV Index:   37 LVOT Area:     2.84 cm   RIGHT VENTRICLE RV S prime:     12.60 cm/s TAPSE (M-mode): 2.3 cm  LEFT ATRIUM             Index        RIGHT ATRIUM           Index LA diam:        3.10 cm 1.46 cm/m   RA Area:     19.50 cm LA Vol (A2C):   45.4 ml 21.42 ml/m  RA Volume:   61.50 ml  29.01 ml/m LA Vol (A4C):   56.1 ml 26.44 ml/m LA Biplane Vol: 52.8 ml 24.91 ml/m AORTIC VALVE LVOT Vmax:   135.00 cm/s LVOT Vmean:  80.700 cm/s LVOT VTI:    0.274 m  AORTA Ao Root diam: 3.20 cm  MITRAL VALVE                TRICUSPID VALVE MV Area (PHT): 2.22 cm    TR Peak grad:   18.0 mmHg MV Decel Time: 342 msec    TR Vmax:  212.00 cm/s MV E velocity: 76.80 cm/s MV A velocity: 87.80 cm/s  SHUNTS MV E/A ratio:  0.87        Systemic VTI:  0.27 m Systemic Diam: 1.90 cm  Wilbert Bihari MD Electronically signed by Wilbert Bihari MD Signature Date/Time: 06/29/2024/11:47:41 AM    Final      CT SCANS  CT CORONARY MORPH W/CTA COR W/SCORE 03/20/2024  Addendum 03/21/2024 11:27 AM ADDENDUM REPORT: 03/21/2024 11:24  EXAM: OVER-READ INTERPRETATION  CT CHEST  The following report is an over-read performed by radiologist Dr. Mabel Converse of [facility] on 03/21/2024. This over-read does not include interpretation of cardiac or coronary anatomy or pathology. The coronary CTA interpretation by the cardiologist is attached.  COMPARISON:  09/14/2018  FINDINGS: Normal heart size. No pericardial effusion. Image thoracic aorta is nonaneurysmal. Central pulmonary vasculature within normal limits.  No adenopathy within the included chest. Imaged lung fields are clear. No acute bony or chest wall abnormality.  IMPRESSION: No acute or significant extracardiac findings.   Electronically Signed By: Mabel Converse D.O. On: 03/21/2024 11:24  Narrative CLINICAL DATA:  68 yo male chest pain/anginal equiv, ECGs and troponins normal  EXAM: Cardiac/Coronary CTA  TECHNIQUE: A non-contrast, gated CT scan was obtained with axial slices of 2.5 mm through the heart for calcium scoring. Calcium scoring was performed using the Agatston method. A 120 kV prospective, gated, contrast cardiac CT scan was obtained. Gantry rotation speed was 230 msec and collimation was 0.63 mm. Two sublingual nitroglycerin  tablets (0.8 mg) were given. The 3D data set was reconstructed with motion correction for the best systolic or diastolic phase. Images were analyzed on a dedicated workstation using MPR, MIP,  and VRT modes. The patient received OMNIPAQUE  IOHEXOL  350 MG/ML SOLN of contrast.  FINDINGS: Image quality: Excellent.  Noise artifact is: Limited.  Coronary arteries: Normal coronary origins.  Right dominance.  Right Coronary Artery: Dominant. Mild discrete mixed proximal and distal stenosis (25-49%). Normal R-PLB and R-PDA branches.  Left Main Coronary Artery: Normal. Bifurcates into the LAD and LCx arteries.  Left Anterior Descending Coronary Artery: Large anterior artery that wraps around the apex. Moderately calcified with mild proximal stenosis (25-49%). Small D1 branch and larger D2 branch with no disease.  Left Circumflex Artery: AV groove vessel, no disease. Large high OM1 branch and large OM2 branch, no disease.  Aorta: Normal size, 35 mm at the mid ascending aorta (level of the PA bifurcation) measured double oblique. No calcifications. No dissection.  Aortic Valve: Trileaflet. No calcifications.  Other findings:  Normal pulmonary vein drainage into the left atrium.  Normal left atrial appendage without a thrombus.  Mildly dilated main pulmonary artery at 30 mm, possibly suggestive of pulmonary hypertension.  Extra-cardiac findings: See attached radiology report for non-cardiac structures.  Posterior mitral annular calcification.  IMPRESSION: 1. Mild mixed non-obstructive CAD, CADRADS = 2.  2. Normal coronary origin with right dominance.  3. Coronary artery calcium score is 370, which places the patient in the 73rd percentile for age/race and sex-matched controls (MESA).  4. Total plaque volume 429 mm3, of which 73 mm3 (17%) is calcified plaque and 347mm3 (83%) is non-calcified plaque. 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  5. Mildly dilated main pulmonary artery at 30 mm, possibly suggestive of pulmonary hypertension.  6. Posterior mitral annular calcification.  7. Aggressive secondary risk factor modification including statin therapy to target LDL < 55 is  recommended.  RECOMMENDATIONS: 1. CAD-RADS 0: No evidence of CAD (0%). Consider non-atherosclerotic causes  of chest pain.  2. CAD-RADS 1: Minimal non-obstructive CAD (0-24%). Consider non-atherosclerotic causes of chest pain. Consider preventive therapy and risk factor modification.  3. CAD-RADS 2: Mild non-obstructive CAD (25-49%). Consider non-atherosclerotic causes of chest pain. Consider preventive therapy and risk factor modification.  4. CAD-RADS 3: Moderate stenosis. Consider symptom-guided anti-ischemic pharmacotherapy as well as risk factor modification per guideline directed care. Additional analysis with CT FFR will be submitted.  5. CAD-RADS 4: Severe stenosis. (70-99% or > 50% left main). Cardiac catheterization or CT FFR is recommended. Consider symptom-guided anti-ischemic pharmacotherapy as well as risk factor modification per guideline directed care. Invasive coronary angiography recommended with revascularization per published guideline statements.  6. CAD-RADS 5: Total coronary occlusion (100%). Consider cardiac catheterization or viability assessment. Consider symptom-guided anti-ischemic pharmacotherapy as well as risk factor modification per guideline directed care.  7. CAD-RADS N: Non-diagnostic study. Obstructive CAD can't be excluded. Alternative evaluation is recommended.  Electronically Signed: By: Vinie JAYSON Maxcy M.D. On: 03/21/2024 09:22     ______________________________________________________________________________________________      EKG:        Recent Labs: 03/06/2024: ALT 35; BUN 10; Creatinine, Ser 0.80; Hemoglobin 14.3; Platelets 155; Potassium 4.1; Sodium 140  Recent Lipid Panel    Component Value Date/Time   CHOL 131 07/20/2024 0858   TRIG 151 (H) 07/20/2024 0858   HDL 32 (L) 07/20/2024 0858   CHOLHDL 4.1 07/20/2024 0858   LDLCALC 73 07/20/2024 0858     Risk Assessment/Calculations:                Physical Exam:     VS:  BP 110/70 (BP Location: Right Arm, Patient Position: Sitting, Cuff Size: Large)   Pulse 81   Ht 6' (1.829 m)   Wt 221 lb 9.6 oz (100.5 kg)   SpO2 95%   BMI 30.05 kg/m     Wt Readings from Last 3 Encounters:  08/25/24 221 lb 9.6 oz (100.5 kg)  06/06/24 207 lb 6.4 oz (94.1 kg)  04/05/24 206 lb (93.4 kg)     GEN:  Well nourished, well developed in no acute distress HEENT: Normal NECK: No JVD; No carotid bruits LYMPHATICS: No lymphadenopathy CARDIAC: RRR, no murmurs, rubs, gallops CHEST: Tender to palpation over the left chest RESPIRATORY:  Clear to auscultation without rales, wheezing or rhonchi  ABDOMEN: Soft, non-tender, non-distended MUSCULOSKELETAL:  No edema; No deformity  SKIN: Warm and dry NEUROLOGIC:  Alert and oriented x 3 PSYCHIATRIC:  Normal affect   Assessment & Plan Coronary artery disease involving native coronary artery of native heart with other form of angina pectoris The patient was evaluated with an EKG this morning that showed normal sinus rhythm with no ischemic changes.  We sent him to the lab for a high-sensitivity troponin which was normal at 3.  His symptoms seem to be more consistent with chest wall pain.  In order to evaluate him further, I have recommended a Lexiscan Myoview stress test to make sure that he is not having any ischemic symptoms.  He can keep his regularly scheduled cardiology follow-up. Pure hypercholesterolemia Seen in the lipid clinic this morning with plans to start a PCSK9 inhibitor in the setting of his elevated LP(a) greater than 200.  Noted to have high coronary plaque volume on his coronary CT angiogram. Chest pain of uncertain etiology   Informed Consent   Shared Decision Making/Informed Consent The risks [chest pain, shortness of breath, cardiac arrhythmias, dizziness, blood pressure fluctuations, myocardial infarction, stroke/transient ischemic attack, nausea,  vomiting, allergic reaction, radiation exposure, metallic  taste sensation and life-threatening complications (estimated to be 1 in 10,000)], benefits (risk stratification, diagnosing coronary artery disease, treatment guidance) and alternatives of a nuclear stress test were discussed in detail with Craig Scott and he agrees to proceed.              Medication Adjustments/Labs and Tests Ordered: Current medicines are reviewed at length with the patient today.  Concerns regarding medicines are outlined above.  Orders Placed This Encounter  Procedures   Cardiac Stress Test: Informed Consent Details: Physician/Practitioner Attestation; Transcribe to consent form and obtain patient signature   MYOCARDIAL PERFUSION IMAGING   No orders of the defined types were placed in this encounter.   Patient Instructions  Medication Instructions:  Your physician recommends that you continue on your current medications as directed. Please refer to the Current Medication list given to you today.  *If you need a refill on your cardiac medications before your next appointment, please call your pharmacy*  Lab Work: NONE If you have labs (blood work) drawn today and your tests are completely normal, you will receive your results only by: MyChart Message (if you have MyChart) OR A paper copy in the mail If you have any lab test that is abnormal or we need to change your treatment, we will call you to review the results.  Testing/Procedures: Morris Bullock Stress Test  Follow-Up: At [facility], you and your health needs are our priority.  As part of our continuing mission to provide you with exceptional heart care, our providers are all part of one team.  This team includes your primary Cardiologist (physician) and Advanced Practice Providers or APPs (Physician Assistants and Nurse Practitioners) who all work together to provide you with the care you need, when you need it.  Your next appointment:   March  Provider:   Glendia Ferrier, PA-C  We  recommend signing up for the patient portal called MyChart.  Sign up information is provided on this After Visit Summary.  MyChart is used to connect with patients for Virtual Visits (Telemedicine).  Patients are able to view lab/test results, encounter notes, upcoming appointments, etc.  Non-urgent messages can be sent to your provider as well.   To learn more about what you can do with MyChart, go to [email or web address].       Signed, Ozell Fell, MD  08/25/2024 3:57 PM    [facility]     [1]  Current Meds  Medication Sig   ALPRAZolam  (XANAX ) 1 MG tablet Take 1 mg by mouth 4 (four) times daily as needed for anxiety.   aspirin  EC 81 MG tablet Take 1 tablet (81 mg total) by mouth 2 (two) times daily.   celecoxib (CELEBREX) 200 MG capsule Take 200 mg by mouth daily.   cetirizine (ZYRTEC) 10 MG tablet Take 10 mg by mouth daily.   Cholecalciferol (VITAMIN D3) 125 MCG (5000 UT) CAPS Take 5,000 Units by mouth daily.   ENBREL SURECLICK 50 MG/ML injection Inject 50 mg into the skin once a week.   GAVILYTE-G 236 g solution as directed. colonoscopy   nitroGLYCERIN  (NITROSTAT ) 0.4 MG SL tablet Place 1 tablet (0.4 mg total) under the tongue every 5 (five) minutes as needed.   pantoprazole  (PROTONIX ) 40 MG tablet Take 1 tablet (40 mg total) by mouth daily.   Potassium 99 MG TABS Take 99 mg by mouth daily.   pregabalin (LYRICA) 25 MG capsule Take 25 mg by mouth  daily.   simvastatin  (ZOCOR ) 20 MG tablet Take 20 mg by mouth daily.   tamsulosin (FLOMAX) 0.4 MG CAPS capsule Take 0.4 mg by mouth daily.

## 2024-08-25 NOTE — Telephone Encounter (Signed)
 Pharmacy Patient Advocate Encounter  Received notification from [employer] that Prior Authorization for REPATHA has been APPROVED from 08/25/24 to 09/13/25. Ran test claim, Copay is $0.00. This test claim was processed through [facility]- copay amounts may vary at other pharmacies due to pharmacy/plan contracts, or as the patient moves through the different stages of their insurance plan.   PA #/Case ID/Reference #: [id number]

## 2024-08-25 NOTE — Progress Notes (Signed)
 Patient ID: Craig Scott                 DOB: 1956/06/04                    MRN: [id number]      HPI: MARKUS CASTEN is a 68 y.o. male patient referred to lipid clinic by Glendia Ferrier, PA. PMH is significant for CAD (CCTA mild mixed calcific and noncalcific plaque with severe total plaque volume) and HLD.  Patient was evaluated by cardiology in September 2025 for CAD follow-up. CCTA revealed mild mixed calcific and non-calcific plaque with severe total plaque volume. He reported chest pain associated with emotional stress but no exertional chest pain. Instructions included continuing ASA 81 mg daily, Simvastatin  20 mg daily, and nitroglycerin  as needed. LDL goal is <55 mg/dL. Patient was referred to the PharmD lipid clinic to consider PCSK9 inhibitor therapy for further LDL reduction.  Today, patient presents with his wife and reports chest pain over the past few days. He states he has nitroglycerin  but has not used it. Additionally, he complains of numbness in his arm and requests to see a provider today if possible.  Regarding lipid management, patient is currently taking Simvastatin  20 mg daily. Most recent lipid panel showed LDL of 73 mg/dL (goal <44 mg/dL). Dietary habits include eating at [employer] (chicken livers, baked chicken tenders, turnip greens or pinto beans), fish 3-4 times per week, and occasional turkey sandwich on whole wheat bread. Beverages include water  and Canada Dry. Patient walks his dogs daily and remains active. He expresses motivation to make lifestyle changes.  Reviewed lipoprotein(a), its significance, and its role in overall cardiovascular risk. Explained that there are currently no FDA-approved medications specifically targeting and effectively lowering Lp(a). Also discussed the potential variability in Lp(a) levels following initiation of PCSK9 inhibitor therapy.  Reviewed options for lowering LDL cholesterol, including, PCSK-9 inhibitors. Discussed  mechanisms of action, dosing, side effects and potential decreases in LDL cholesterol.  Also reviewed cost information and potential options for patient assistance.   Current Medications: simvastatin  20 mg daily  Intolerances: none Risk Factors: tobacco abuse, CAD (CCTA mild mixed calcific and noncalcific plaque with severe total plaque volume), elevated Lp(a), family hx of ASCVD (father) LDL goal: <55  Lipid panel (07/2024): Chol 131, Trig 151, HDL 32, LDL 73  Lp(a) (07/2024): 212 nmol/L Liver enzymes (02/2024): AST 29, ALT 35, Alk phos 72  Diet:  Breakfast: Cheerios with 2% milk and a banana Lunch/Dinner: [employer]--options include chicken livers, baked chicken tenders, turnip greens or pinto beans; also eats fish 3-4 times per week and occasionally has a turkey sandwich on whole wheat bread Beverages: Water  and Canada Dry (diet)  Exercise:  Walks dogs daily and remains active outdoors despite rheumatoid arthritis, though activity is limited during bad weather.   Family History:  Relation Problem Comments  Mother (Deceased) Arthritis   Colon cancer age 71  Dementia     Father (Deceased) Heart attack      Brother Metallurgist) Colon cancer 27    Brother (Deceased) Colon cancer 59    Maternal Grandmother (Deceased)   Maternal Grandfather (Deceased)   Paternal Grandmother (Deceased)   Paternal Grandfather (Deceased)   Cousin Colon cancer 65 years old onset    Cousin Colon cancer 49 years onset    Daughter Metallurgist)   Son Metallurgist)      Social History:  Alcohol: he has cut back on drinking, he had 3-4 beers  last night and doesn't drink every day  Smoking: former tobacco user; smokes marijuanna once a day  Labs:  Lipid Panel     Component Value Date/Time   CHOL 131 07/20/2024 0858   TRIG 151 (H) 07/20/2024 0858   HDL 32 (L) 07/20/2024 0858   CHOLHDL 4.1 07/20/2024 0858   LDLCALC 73 07/20/2024 0858   LABVLDL 26 07/20/2024 0858    Past Medical History:  Diagnosis  Date   Alcohol dependence, daily use (HCC)    Anginal pain    Anxiety    Arthritis    ra   Back pain    Bursitis    CAD (coronary artery disease) 06/30/2024   CCTA 03/20/24: mild mixed nonobstructive CAD; CAC score 370 (73rd percentile), TPV 429 mm3 (73 mm3 or 17% Ca2+, 356 mm3 or 83% noncalcific plaque) - TPV is severe; mildly dilated main PA (30 mm)   TTE 06/29/24: EF 60-65, no RWMA, NL PASP, RVSP 21      Coronary artery disease    Depression    GERD (gastroesophageal reflux disease)    High cholesterol    History of concussion    several , last time yrs ago no deficits from   History of COVID-19    6 to 7 months ago symptoms all resolved per pt on 09-09-2021   History of hip surgery    Lyme disease    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 Hyperlipidemia -  Problem  Cad (Coronary Artery Disease)   CCTA 03/20/24: mild mixed nonobstructive CAD; CAC score 370 (73rd percentile), TPV 429 mm3 (73 mm3 or 17% Ca2+, 356 mm3 or 83% noncalcific plaque) - TPV is severe; mildly dilated main PA (30 mm)  TTE 06/29/24: EF 60-65, no RWMA, NL PASP, RVSP 21     CAD (coronary artery disease) Assessment:  LDL goal: < 55 mg/dl; last LDLc  73 mg/dl (67/7974) Lpa 787 nmol/L Has tolerated moderate intensity statin well without any side effects for years; Would likely benefit from high intensity statin but will keep simvastatin  due to tolerance and patient preference  Liver enzymes WNL Discussed next potential options (PCSK-9 inhibitors); cost, dosing efficacy, side effects  Patient willing to proceed with PCSK9i Ongoing chest pain for past three days and some arm numbness today; will contact DOD to see if he can be evaluated today - Reviewed symptoms of stroke and MI and when to see  immediate attention  Plan: Continue taking simvastatin  20 mg daily Will apply for PA for PCSK9i; will inform patient upon approval  Lipid lab and Lpa due in 3 months after starting PCSK9i  Spoke with Dr. Michele regarding the patients chest pain and arm numbness; patient will be evaluated and an EKG will be obtained today    Thank you,  Onnie Alatorre E. Agnes Probert, Pharm.D, CPP [facility]. [facility] [address], [place], [place] [address] Phone: [phone]; Fax: [phone]        [1]  Current Outpatient Medications on File Prior to Visit  Medication Sig Dispense Refill   ALPRAZolam  (XANAX ) 1 MG tablet Take 1 mg by mouth 4 (four) times daily as needed for anxiety.  aspirin  EC 81 MG tablet Take 1 tablet (81 mg total) by mouth 2 (two) times daily. 84 tablet 0   celecoxib (CELEBREX) 200 MG capsule Take 200 mg by mouth daily.     cetirizine (ZYRTEC) 10 MG tablet Take 10 mg by mouth daily.     Cholecalciferol (VITAMIN D3) 125 MCG (5000 UT) CAPS Take 5,000 Units by mouth daily.     ENBREL SURECLICK 50 MG/ML injection Inject 50 mg into the skin once a week.     GAVILYTE-G 236 g solution as directed. colonoscopy     nitroGLYCERIN  (NITROSTAT ) 0.4 MG SL tablet Place 1 tablet (0.4 mg total) under the tongue every 5 (five) minutes as needed. 25 tablet 3   pantoprazole  (PROTONIX ) 40 MG tablet Take 1 tablet (40 mg total) by mouth daily. 30 tablet 11   Potassium 99 MG TABS Take 99 mg by mouth daily.     pregabalin (LYRICA) 25 MG capsule Take 25 mg by mouth daily.     simvastatin  (ZOCOR ) 20 MG tablet Take 20 mg by mouth daily.     tamsulosin (FLOMAX) 0.4 MG CAPS capsule Take 0.4 mg by mouth daily.     No current facility-administered medications on file prior to visit.  [2]  Allergies Allergen Reactions   Doxycycline      Pt states he can now tolerate this medication and had it over the summer   Lyrica [Pregabalin] Other (See Comments)    changes  personality

## 2024-08-25 NOTE — Patient Instructions (Addendum)
 Your Results:             Your most recent labs Goal  Total Cholesterol 131 < 200  Triglycerides 151 < 150  HDL (happy/good cholesterol) 32 > 40  LDL (lousy/bad cholesterol 73 < 55  Lp(a) 212 < 75   Medication changes: Continue simvastatin  20 mg daily We will start the process to get Repatha covered by your insurance.  Once the prior authorization is complete, we will call you to let you know and confirm pharmacy information.    Lab orders:We want to repeat labs 3 months after starting PCSK9i.  We will send you a lab order to remind you once we        get closer to that time.    Jb Dulworth E. Pervis Macintyre, Pharm.D, CPP [facility]. [facility] [address], [place], [place] [address] Phone: [phone]; Fax: [phone]     Praluent is a cholesterol medication that improved your body's ability to get rid of bad cholesterol known as LDL. It can lower your LDL up to 60%. It is an injection that is given under the skin every 2 weeks. The most common side effects of Praluent include runny nose, symptoms of the common cold, rarely flu or flu-like symptoms, back/muscle pain in about 3-4% of the patients, and redness, pain, or bruising at the injection site.    Repatha is a cholesterol medication that improved your body's ability to get rid of bad cholesterol known as LDL. It can lower your LDL up to 60%! It is an injection that is given under the skin every 2 weeks. The medication often requires a prior authorization from your insurance company. We will take care of submitting all the necessary information to your insurance company to get it approved. The most common side effects of Repatha include runny nose, symptoms of the common cold, rarely flu or flu-like symptoms, back/muscle pain in about 3-4% of the patients, and redness, pain, or bruising at the injection site.    It is also recommended that patients with high cholesterol adhere to a heart  healthy diet, get regular exercise, avoid use of tobacco products, and maintain a healthy weight. Steps that you can take to help in these areas:  Limit consumption of trans fats, saturated fats, and cholesterol in your diet  Increase intake of lean meats such as chicken, turkey, and fish  Increase intake of foods rich in fiber such as fresh fruits, vegetables, beans and oatmeal Exercise as you are able; even 30 minutes of walking daily can aid in increasing heart health      Copay Assistance:  The [employer] foundation offers assistance to help pay for medication copays.  They will cover copays for all cholesterol lowering meds, including statins, fibrates, omega-3 oils, ezetimibe, Repatha, Praluent, Nexletol, Nexlizet.  The cards are usually good for $2,500 or 12 months, whichever comes first. Go to healthwellfoundation.org Click on Apply Now Answer questions as to whom is applying (patient or representative) Your disease fund will be hypercholesterolemia - Medicare access Select the cholesterol medication you need assistance with (Repatha, Praluent, Nexlizet...) They will ask question about qualifying diagnosis - you can mark yes; and do you have insurance coverage.   When they ask what type of assistance you are interested in - copay assistance When you submit, the approval is usually within minutes.  You will need to print the card information from the site You will need to  show this information to your pharmacy, they will bill your Medicare Part D plan first -then bill [employer] --for the copay.   You can also call them at [phone], although the hold times can be quite long.

## 2024-08-25 NOTE — Telephone Encounter (Signed)
 Pharmacy Patient Advocate Encounter   Received notification from Pt Calls Messages that prior authorization for REPATHA is required/requested.   Insurance verification completed.   The patient is insured through [employer].   Per test claim: PA required; PA submitted to above mentioned insurance via Latent Key/confirmation #/EOC [id number] Status is pending

## 2024-08-25 NOTE — Assessment & Plan Note (Addendum)
 The patient was evaluated with an EKG this morning that showed normal sinus rhythm with no ischemic changes.  We sent him to the lab for a high-sensitivity troponin which was normal at 3.  His symptoms seem to be more consistent with chest wall pain.  In order to evaluate him further, I have recommended a Lexiscan Myoview stress test to make sure that he is not having any ischemic symptoms.  He can keep his regularly scheduled cardiology follow-up.

## 2024-08-25 NOTE — Telephone Encounter (Signed)
 Please see other encounter.

## 2024-08-25 NOTE — Progress Notes (Signed)
 Per Dr. Wonda, based on symptoms and time prior to appointment, having High Sensitivity Troponin drawn at [facility]

## 2024-08-25 NOTE — Patient Instructions (Signed)
 Medication Instructions:  Your physician recommends that you continue on your current medications as directed. Please refer to the Current Medication list given to you today.  *If you need a refill on your cardiac medications before your next appointment, please call your pharmacy*  Lab Work: NONE If you have labs (blood work) drawn today and your tests are completely normal, you will receive your results only by: MyChart Message (if you have MyChart) OR A paper copy in the mail If you have any lab test that is abnormal or we need to change your treatment, we will call you to review the results.  Testing/Procedures: Morris Bullock Stress Test  Follow-Up: At [facility], you and your health needs are our priority.  As part of our continuing mission to provide you with exceptional heart care, our providers are all part of one team.  This team includes your primary Cardiologist (physician) and Advanced Practice Providers or APPs (Physician Assistants and Nurse Practitioners) who all work together to provide you with the care you need, when you need it.  Your next appointment:   March  Provider:   Glendia Ferrier, PA-C  We recommend signing up for the patient portal called MyChart.  Sign up information is provided on this After Visit Summary.  MyChart is used to connect with patients for Virtual Visits (Telemedicine).  Patients are able to view lab/test results, encounter notes, upcoming appointments, etc.  Non-urgent messages can be sent to your provider as well.   To learn more about what you can do with MyChart, go to [email or web address].

## 2024-08-28 ENCOUNTER — Telehealth (HOSPITAL_COMMUNITY): Payer: Self-pay

## 2024-08-28 NOTE — Telephone Encounter (Signed)
 Detailed instructions left on the patient's answering machine. Craig Scott CCT

## 2024-08-28 NOTE — Telephone Encounter (Signed)
 Called patient to discuss Repatha  copay and confirm pharmacy to send rx. Rx sent.

## 2024-08-30 DIAGNOSIS — I25118 Atherosclerotic heart disease of native coronary artery with other forms of angina pectoris: Secondary | ICD-10-CM

## 2024-08-30 DIAGNOSIS — R079 Chest pain, unspecified: Secondary | ICD-10-CM

## 2024-09-01 ENCOUNTER — Ambulatory Visit: Payer: Self-pay | Admitting: Cardiovascular Disease

## 2024-09-01 ENCOUNTER — Ambulatory Visit (HOSPITAL_COMMUNITY)
Admission: RE | Admit: 2024-09-01 | Discharge: 2024-09-01 | Disposition: A | Discharge status: Home / Self Care | Source: Ambulatory Visit | Attending: Cardiovascular Disease | Admitting: Cardiovascular Disease

## 2024-09-01 DIAGNOSIS — R079 Chest pain, unspecified: Secondary | ICD-10-CM | POA: Diagnosis not present

## 2024-09-01 DIAGNOSIS — I25118 Atherosclerotic heart disease of native coronary artery with other forms of angina pectoris: Secondary | ICD-10-CM | POA: Insufficient documentation

## 2024-09-01 LAB — MYOCARDIAL PERFUSION IMAGING
LV dias vol: 109 mL (ref 62–150)
LV sys vol: 40 mL
Nuc Stress EF: 63 %
Peak HR: 101 {beats}/min
Rest HR: 65 {beats}/min
Rest Nuclear Isotope Dose: 10.8 mCi
SDS: -2
SRS: 7
SSS: 5
ST Depression (mm): 0 mm
Stress Nuclear Isotope Dose: 30.6 mCi
TID: 0.98

## 2024-09-01 MED ORDER — TECHNETIUM TC 99M TETROFOSMIN IV KIT
30.6000 | PACK | Freq: Once | INTRAVENOUS | Status: AC | PRN
  Administered 2024-09-01: 30.6 via INTRAVENOUS

## 2024-09-01 MED ORDER — REGADENOSON 0.4 MG/5ML IV SOLN
0.4000 mg | Freq: Once | INTRAVENOUS | Status: AC
  Administered 2024-09-01: 0.4 mg via INTRAVENOUS

## 2024-09-01 MED ORDER — REGADENOSON 0.4 MG/5ML IV SOLN
INTRAVENOUS | Status: AC
  Filled 2024-09-01: qty 5

## 2024-09-01 MED ORDER — TECHNETIUM TC 99M TETROFOSMIN IV KIT
10.8000 | PACK | Freq: Once | INTRAVENOUS | Status: AC | PRN
  Administered 2024-09-01: 10.8 via INTRAVENOUS

## 2024-09-26 ENCOUNTER — Ambulatory Visit: Admitting: Physician Assistant

## 2024-09-28 NOTE — Telephone Encounter (Signed)
 Pt requesting a c/b to discuss results.

## 2024-11-28 ENCOUNTER — Ambulatory Visit: Admitting: Physician Assistant
